# Patient Record
Sex: Female | Born: 1945 | Race: White | Hispanic: No | Smoking: Current every day smoker
Health system: Southern US, Community
[De-identification: ages and names within clinical notes are randomized; demographics above are authoritative.]

## PROBLEM LIST (undated history)

## (undated) DIAGNOSIS — I4891 Unspecified atrial fibrillation: Secondary | ICD-10-CM

## (undated) DIAGNOSIS — I509 Heart failure, unspecified: Secondary | ICD-10-CM

## (undated) DIAGNOSIS — F419 Anxiety disorder, unspecified: Secondary | ICD-10-CM

## (undated) DIAGNOSIS — J449 Chronic obstructive pulmonary disease, unspecified: Secondary | ICD-10-CM

## (undated) DIAGNOSIS — J189 Pneumonia, unspecified organism: Secondary | ICD-10-CM

## (undated) DIAGNOSIS — R0600 Dyspnea, unspecified: Secondary | ICD-10-CM

## (undated) DIAGNOSIS — M1990 Unspecified osteoarthritis, unspecified site: Secondary | ICD-10-CM

## (undated) DIAGNOSIS — T8859XA Other complications of anesthesia, initial encounter: Secondary | ICD-10-CM

## (undated) HISTORY — PX: BACK SURGERY: SHX140

## (undated) HISTORY — PX: CHOLECYSTECTOMY: SHX55

## (undated) HISTORY — PX: NECK SURGERY: SHX720

## (undated) HISTORY — PX: TUBAL LIGATION: SHX77

## (undated) HISTORY — PX: REPLACEMENT TOTAL KNEE: SUR1224

## (undated) NOTE — *Deleted (*Deleted)
Primary Care Physician: Patient, No Pcp Per Referring Physician: [facility] ER  f/u   Sherri Hart is a 66 y.o. female with a h/o  A. fib, tobacco use, COPD not on oxygen and osteoarthritis presenting with shortness of breath, orthopnea leg swelling and admitted for A. fib with RVR and acute on chronic CHF. Originally from [place]. Came to [facility] to take care of grandchildren due to daughter's untimely death. She lost her medications with her luggage.  In ED, in A. fib with RVR. CXR with bilateral opacities concerning for acute cardiogenic pulmonary edema. Started on Cardizem drip, IV Lasix and IV heparin and admitted.  Echocardiogram without significant finding. Lower extremity Doppler negative for DVT. Patient was transitioned to p.o. Cardizem and p.o. Lasix and continue to diurese well.  She had net -8.5 L charted.  Weight down from 208 pounds to 196 pounds.  However, she required 3 L to maintain appropriate saturation with ambulation.  She was discharged with home health and home oxygen.    Today, she denies symptoms of palpitations, chest pain, shortness of breath, orthopnea, PND, lower extremity edema, dizziness, presyncope, syncope, or neurologic sequela. The patient is tolerating medications without difficulties and is otherwise without complaint today.   Past Medical History:  Diagnosis Date  . Atrial fibrillation (HCC)   . Congestive heart failure (HCC)    No past surgical history on file.  Current Outpatient Medications  Medication Sig Dispense Refill  . acetaminophen (TYLENOL) 500 MG tablet Take 500 mg by mouth every 6 (six) hours as needed for moderate pain.    Marland Kitchen albuterol (VENTOLIN HFA) 108 (90 Base) MCG/ACT inhaler Inhale 2 puffs into the lungs every 6 (six) hours as needed for wheezing or shortness of breath. 8 g 2  . apixaban (ELIQUIS) 5 MG TABS tablet Take 1 tablet (5 mg total) by mouth 2 (two) times daily. 180 tablet 1  . diltiazem (CARDIZEM CD) 180 MG 24  hr capsule Take 1 capsule (180 mg total) by mouth daily. 90 capsule 1  . furosemide (LASIX) 40 MG tablet Take 1 tablet (40 mg total) by mouth daily. 90 tablet 1  . tiotropium (SPIRIVA HANDIHALER) 18 MCG inhalation capsule Place 1 capsule (18 mcg total) into inhaler and inhale daily. 30 capsule 2   No current facility-administered medications for this encounter.    Allergies  Allergen Reactions  . Penicillins     Did it involve swelling of the face/tongue/throat, SOB, or low BP? Yes Did it involve sudden or severe rash/hives, skin peeling, or any reaction on the inside of your mouth or nose? N Did you need to seek medical attention at a hospital or doctor's office? Y When did it last happen?childhood If all above answers are "NO", may proceed with cephalosporin use.     Social History   Socioeconomic History  . Marital status: Single    Spouse name: Not on file  . Number of children: Not on file  . Years of education: Not on file  . Highest education level: Not on file  Occupational History  . Not on file  Tobacco Use  . Smoking status: Not on file  Substance and Sexual Activity  . Alcohol use: Not on file  . Drug use: Not on file  . Sexual activity: Not on file  Other Topics Concern  . Not on file  Social History Narrative  . Not on file   Social Determinants of Health   Financial Resource Strain:   .  Difficulty of Paying Living Expenses: Not on file  Food Insecurity:   . Worried About Programme researcher, broadcasting/film/video in the Last Year: Not on file  . Ran Out of Food in the Last Year: Not on file  Transportation Needs: No Transportation Needs  . Lack of Transportation (Medical): No  . Lack of Transportation (Non-Medical): No  Physical Activity:   . Days of Exercise per Week: Not on file  . Minutes of Exercise per Session: Not on file  Stress:   . Feeling of Stress : Not on file  Social Connections:   . Frequency of Communication with Friends and Family: Not on file  .  Frequency of Social Gatherings with Friends and Family: Not on file  . Attends Religious Services: Not on file  . Active Member of Clubs or Organizations: Not on file  . Attends Banker Meetings: Not on file  . Marital Status: Not on file  Intimate Partner Violence:   . Fear of Current or Ex-Partner: Not on file  . Emotionally Abused: Not on file  . Physically Abused: Not on file  . Sexually Abused: Not on file    No family history on file.  ROS- All systems are reviewed and negative except as per the HPI above  Physical Exam: There were no vitals filed for this visit. Wt Readings from Last 3 Encounters:  07/07/20 89 kg    Labs: Lab Results  Component Value Date   NA 137 07/07/2020   K 4.0 07/07/2020   CL 94 (L) 07/07/2020   CO2 33 (H) 07/07/2020   GLUCOSE 93 07/07/2020   BUN 19 07/07/2020   CREATININE 1.21 (H) 07/07/2020   CALCIUM 8.9 07/07/2020   PHOS 3.6 07/07/2020   MG 1.8 07/07/2020   No results found for: INR No results found for: CHOL, HDL, LDLCALC, TRIG   GEN- The patient is well appearing, alert and oriented x 3 today.   Head- normocephalic, atraumatic Eyes-  Sclera clear, conjunctiva pink Ears- hearing intact Oropharynx- clear Neck- supple, no JVP Lymph- no cervical lymphadenopathy Lungs- Clear to ausculation bilaterally, normal work of breathing Heart- Regular rate and rhythm, no murmurs, rubs or gallops, PMI not laterally displaced GI- soft, NT, ND, + BS Extremities- no clubbing, cyanosis, or edema MS- no significant deformity or atrophy Skin- no rash or lesion Psych- euthymic mood, full affect Neuro- strength and sensation are intact  EKG-    Assessment and Plan: 1.Acute hypoxemic respiratory failure likely due to CHF and A. fib:  -Discharged on 3 L by nasal cannula -Treat CHF, A. fib and COPD as below. Bmet/cbc/mag today   2. Acute diastolic CHF: Echo with EF of 55 to 60%, mild concentric LVH, severe LAE, severe RAE, RVSP  of 37.2 and a small pericardial effusion.  Presented with cardinal symptoms including dyspnea, orthopnea or lower extremity edema. Elevated BNP to 500. CXR with mild bilateral pulmonary edema.  Diuresed with IV and p.o. Lasix.  Net -8.5 L.  Weight down from 208 pounds to 96 pounds.  Creatinine slightly up but stable. -Discharged on p.o. Lasix 40 mg daily. -Counseled on sodium and fluid restrictions. -Reassess fluid status and renal function at follow-up.  3. Paroxysmal A. fib with RVR: Likely provoked by CHF and missed doses of her meds. -Discharged on p.o. Cardizem CD 180 mg daily and Eliquis 500 mg twice daily -Ambulatory referral to A. fib clinic  Chronic COPD: doubt exacerbation. Respiratory symptoms likely due to CHF versus COPD. -Discharged  on Spiriva and albuterol.  Insurance did not cover [employer].  Elevated troponin: Flat without significant delta. Likely demand ischemia from CHF and A. fib versus ACS. No chest pain. Echocardiogram reassuring. -No further work-up  Hypokalemia/hypomagnesemia: Resolved. -Recheck at follow-up.

---

## 2020-07-04 ENCOUNTER — Inpatient Hospital Stay (HOSPITAL_COMMUNITY)
Admission: EM | Admit: 2020-07-04 | Discharge: 2020-07-07 | DRG: 291 | Disposition: A | Discharge status: Home Health | Payer: Medicare HMO | Attending: Student | Admitting: Student

## 2020-07-04 ENCOUNTER — Observation Stay (HOSPITAL_COMMUNITY): Payer: Medicare HMO

## 2020-07-04 ENCOUNTER — Encounter (HOSPITAL_COMMUNITY): Payer: Self-pay | Admitting: Physician Assistant

## 2020-07-04 ENCOUNTER — Emergency Department (HOSPITAL_COMMUNITY): Payer: Medicare HMO

## 2020-07-04 ENCOUNTER — Other Ambulatory Visit: Payer: Self-pay

## 2020-07-04 DIAGNOSIS — J441 Chronic obstructive pulmonary disease with (acute) exacerbation: Secondary | ICD-10-CM | POA: Diagnosis not present

## 2020-07-04 DIAGNOSIS — I480 Paroxysmal atrial fibrillation: Secondary | ICD-10-CM | POA: Diagnosis present

## 2020-07-04 DIAGNOSIS — R7989 Other specified abnormal findings of blood chemistry: Secondary | ICD-10-CM | POA: Diagnosis present

## 2020-07-04 DIAGNOSIS — I501 Left ventricular failure: Secondary | ICD-10-CM | POA: Diagnosis not present

## 2020-07-04 DIAGNOSIS — E876 Hypokalemia: Secondary | ICD-10-CM | POA: Diagnosis present

## 2020-07-04 DIAGNOSIS — R5381 Other malaise: Secondary | ICD-10-CM | POA: Diagnosis present

## 2020-07-04 DIAGNOSIS — F17210 Nicotine dependence, cigarettes, uncomplicated: Secondary | ICD-10-CM | POA: Diagnosis present

## 2020-07-04 DIAGNOSIS — I4891 Unspecified atrial fibrillation: Secondary | ICD-10-CM | POA: Diagnosis present

## 2020-07-04 DIAGNOSIS — Z79899 Other long term (current) drug therapy: Secondary | ICD-10-CM

## 2020-07-04 DIAGNOSIS — I110 Hypertensive heart disease with heart failure: Secondary | ICD-10-CM | POA: Diagnosis not present

## 2020-07-04 DIAGNOSIS — J9601 Acute respiratory failure with hypoxia: Secondary | ICD-10-CM | POA: Diagnosis present

## 2020-07-04 DIAGNOSIS — R0602 Shortness of breath: Secondary | ICD-10-CM | POA: Diagnosis not present

## 2020-07-04 DIAGNOSIS — J449 Chronic obstructive pulmonary disease, unspecified: Secondary | ICD-10-CM | POA: Diagnosis present

## 2020-07-04 DIAGNOSIS — I5033 Acute on chronic diastolic (congestive) heart failure: Secondary | ICD-10-CM | POA: Diagnosis present

## 2020-07-04 DIAGNOSIS — Z9114 Patient's other noncompliance with medication regimen: Secondary | ICD-10-CM

## 2020-07-04 DIAGNOSIS — R778 Other specified abnormalities of plasma proteins: Secondary | ICD-10-CM | POA: Diagnosis present

## 2020-07-04 DIAGNOSIS — I5031 Acute diastolic (congestive) heart failure: Secondary | ICD-10-CM | POA: Diagnosis present

## 2020-07-04 DIAGNOSIS — I509 Heart failure, unspecified: Secondary | ICD-10-CM

## 2020-07-04 DIAGNOSIS — I248 Other forms of acute ischemic heart disease: Secondary | ICD-10-CM | POA: Diagnosis present

## 2020-07-04 DIAGNOSIS — Z20822 Contact with and (suspected) exposure to covid-19: Secondary | ICD-10-CM | POA: Diagnosis present

## 2020-07-04 DIAGNOSIS — Z6832 Body mass index (BMI) 32.0-32.9, adult: Secondary | ICD-10-CM

## 2020-07-04 DIAGNOSIS — E669 Obesity, unspecified: Secondary | ICD-10-CM | POA: Diagnosis present

## 2020-07-04 HISTORY — DX: Heart failure, unspecified: I50.9

## 2020-07-04 HISTORY — DX: Unspecified atrial fibrillation: I48.91

## 2020-07-04 LAB — CBC WITH DIFFERENTIAL/PLATELET
Abs Immature Granulocytes: 0.02 10*3/uL (ref 0.00–0.07)
Basophils Absolute: 0 10*3/uL (ref 0.0–0.1)
Basophils Relative: 1 %
Eosinophils Absolute: 0.1 10*3/uL (ref 0.0–0.5)
Eosinophils Relative: 2 %
HCT: 41.3 % (ref 36.0–46.0)
Hemoglobin: 13.3 g/dL (ref 12.0–15.0)
Immature Granulocytes: 0 %
Lymphocytes Relative: 20 %
Lymphs Abs: 1.4 10*3/uL (ref 0.7–4.0)
MCH: 33.1 pg (ref 26.0–34.0)
MCHC: 32.2 g/dL (ref 30.0–36.0)
MCV: 102.7 fL — ABNORMAL HIGH (ref 80.0–100.0)
Monocytes Absolute: 0.6 10*3/uL (ref 0.1–1.0)
Monocytes Relative: 9 %
Neutro Abs: 4.6 10*3/uL (ref 1.7–7.7)
Neutrophils Relative %: 68 %
Platelets: 197 10*3/uL (ref 150–400)
RBC: 4.02 MIL/uL (ref 3.87–5.11)
RDW: 16.4 % — ABNORMAL HIGH (ref 11.5–15.5)
WBC: 6.8 10*3/uL (ref 4.0–10.5)
nRBC: 0 % (ref 0.0–0.2)

## 2020-07-04 LAB — MAGNESIUM: Magnesium: 1.6 mg/dL — ABNORMAL LOW (ref 1.7–2.4)

## 2020-07-04 LAB — BASIC METABOLIC PANEL
Anion gap: 8 (ref 5–15)
BUN: 14 mg/dL (ref 8–23)
CO2: 26 mmol/L (ref 22–32)
Calcium: 9 mg/dL (ref 8.9–10.3)
Chloride: 110 mmol/L (ref 98–111)
Creatinine, Ser: 1.03 mg/dL — ABNORMAL HIGH (ref 0.44–1.00)
GFR, Estimated: 58 mL/min — ABNORMAL LOW (ref >60)
Glucose, Bld: 102 mg/dL — ABNORMAL HIGH (ref 70–99)
Potassium: 3.6 mmol/L (ref 3.5–5.1)
Sodium: 144 mmol/L (ref 135–145)

## 2020-07-04 LAB — TROPONIN I (HIGH SENSITIVITY)
Troponin I (High Sensitivity): 20 ng/L — ABNORMAL HIGH (ref <18)
Troponin I (High Sensitivity): 16 ng/L (ref <18)

## 2020-07-04 LAB — D-DIMER, QUANTITATIVE: D-Dimer, Quant: 2.27 ug/mL-FEU — ABNORMAL HIGH (ref 0.00–0.50)

## 2020-07-04 LAB — RESP PANEL BY RT PCR (RSV, FLU A&B, COVID)
Influenza A by PCR: NEGATIVE
Influenza B by PCR: NEGATIVE
Respiratory Syncytial Virus by PCR: NEGATIVE
SARS Coronavirus 2 by RT PCR: NEGATIVE

## 2020-07-04 LAB — BRAIN NATRIURETIC PEPTIDE: B Natriuretic Peptide: 503.9 pg/mL — ABNORMAL HIGH (ref 0.0–100.0)

## 2020-07-04 MED ORDER — FUROSEMIDE 10 MG/ML IJ SOLN
40.0000 mg | Freq: Once | INTRAMUSCULAR | Status: AC
  Administered 2020-07-05: 40 mg via INTRAVENOUS
  Filled 2020-07-04: qty 4

## 2020-07-04 MED ORDER — MAGNESIUM SULFATE 2 GM/50ML IV SOLN
2.0000 g | Freq: Once | INTRAVENOUS | Status: AC
  Administered 2020-07-04: 2 g via INTRAVENOUS
  Filled 2020-07-04: qty 50

## 2020-07-04 MED ORDER — ALBUTEROL SULFATE HFA 108 (90 BASE) MCG/ACT IN AERS
2.0000 | INHALATION_SPRAY | Freq: Four times a day (QID) | RESPIRATORY_TRACT | Status: DC
  Administered 2020-07-04 – 2020-07-05 (×2): 2 via RESPIRATORY_TRACT
  Filled 2020-07-04: qty 6.7

## 2020-07-04 MED ORDER — HEPARIN SODIUM (PORCINE) 5000 UNIT/ML IJ SOLN
INTRAMUSCULAR | Status: AC
  Administered 2020-07-04: 4000 [IU]
  Filled 2020-07-04: qty 1

## 2020-07-04 MED ORDER — HEPARIN (PORCINE) 25000 UT/250ML-% IV SOLN
1100.0000 [IU]/h | INTRAVENOUS | Status: DC
  Administered 2020-07-04: 1100 [IU]/h via INTRAVENOUS
  Filled 2020-07-04: qty 250

## 2020-07-04 MED ORDER — FENTANYL CITRATE (PF) 100 MCG/2ML IJ SOLN
50.0000 ug | Freq: Once | INTRAMUSCULAR | Status: AC
  Administered 2020-07-04: 50 ug via INTRAVENOUS
  Filled 2020-07-04: qty 2

## 2020-07-04 MED ORDER — ONDANSETRON HCL 4 MG/2ML IJ SOLN: 4.0000 mg | Freq: Four times a day (QID) | INTRAMUSCULAR | Status: DC | PRN

## 2020-07-04 MED ORDER — FUROSEMIDE 10 MG/ML IJ SOLN
40.0000 mg | Freq: Once | INTRAMUSCULAR | Status: AC
  Administered 2020-07-04: 40 mg via INTRAVENOUS
  Filled 2020-07-04: qty 4

## 2020-07-04 MED ORDER — APIXABAN 5 MG PO TABS
5.0000 mg | ORAL_TABLET | Freq: Two times a day (BID) | ORAL | Status: DC
  Administered 2020-07-04 – 2020-07-07 (×6): 5 mg via ORAL
  Filled 2020-07-04 (×6): qty 1

## 2020-07-04 MED ORDER — DILTIAZEM LOAD VIA INFUSION
15.0000 mg | Freq: Once | INTRAVENOUS | Status: AC
  Administered 2020-07-04: 15 mg via INTRAVENOUS
  Filled 2020-07-04: qty 15

## 2020-07-04 MED ORDER — NICOTINE 21 MG/24HR TD PT24
21.0000 mg | MEDICATED_PATCH | Freq: Every day | TRANSDERMAL | Status: DC
  Administered 2020-07-04 – 2020-07-07 (×4): 21 mg via TRANSDERMAL
  Filled 2020-07-04 (×4): qty 1

## 2020-07-04 MED ORDER — METHYLPREDNISOLONE SODIUM SUCC 40 MG IJ SOLR
40.0000 mg | Freq: Two times a day (BID) | INTRAMUSCULAR | Status: DC
  Administered 2020-07-04 – 2020-07-05 (×2): 40 mg via INTRAVENOUS
  Filled 2020-07-04 (×2): qty 1

## 2020-07-04 MED ORDER — POTASSIUM CHLORIDE CRYS ER 20 MEQ PO TBCR
40.0000 meq | EXTENDED_RELEASE_TABLET | Freq: Once | ORAL | Status: AC
  Administered 2020-07-04: 40 meq via ORAL
  Filled 2020-07-04: qty 2

## 2020-07-04 MED ORDER — ACETAMINOPHEN 325 MG PO TABS: 650.0000 mg | ORAL_TABLET | ORAL | Status: DC | PRN

## 2020-07-04 MED ORDER — IOHEXOL 350 MG/ML SOLN
100.0000 mL | Freq: Once | INTRAVENOUS | Status: AC | PRN
  Administered 2020-07-04: 100 mL via INTRAVENOUS

## 2020-07-04 MED ORDER — HEPARIN BOLUS VIA INFUSION
4000.0000 [IU] | Freq: Once | INTRAVENOUS | Status: DC
  Filled 2020-07-04: qty 4000

## 2020-07-04 MED ORDER — DILTIAZEM HCL-DEXTROSE 125-5 MG/125ML-% IV SOLN (PREMIX)
5.0000 mg/h | INTRAVENOUS | Status: DC
  Administered 2020-07-04 – 2020-07-05 (×2): 5 mg/h via INTRAVENOUS
  Filled 2020-07-04 (×2): qty 125

## 2020-07-04 MED ORDER — ALBUTEROL SULFATE HFA 108 (90 BASE) MCG/ACT IN AERS
2.0000 | INHALATION_SPRAY | RESPIRATORY_TRACT | Status: DC | PRN
  Filled 2020-07-04: qty 6.7

## 2020-07-04 MED ORDER — POLYETHYLENE GLYCOL 3350 17 G PO PACK: 17.0000 g | PACK | Freq: Every day | ORAL | Status: DC | PRN

## 2020-07-04 NOTE — ED Triage Notes (Signed)
Pt reports worsening sob and leg swelling over the past few weeks, hx of a fib, recently moved here from up [place]. respirations labored in triage, a fib rvr 150s

## 2020-07-04 NOTE — Progress Notes (Signed)
ANTICOAGULATION CONSULT NOTE   Pharmacy Consult for Heparin>>>Apixaban  Indication: atrial fibrillation  Allergies  Allergen Reactions  . Penicillins     Did it involve swelling of the face/tongue/throat, SOB, or low BP? Yes Did it involve sudden or severe rash/hives, skin peeling, or any reaction on the inside of your mouth or nose? N Did you need to seek medical attention at a hospital or doctor's office? Y When did it last happen?childhood If all above answers are "NO", may proceed with cephalosporin use.     Patient Measurements:   Heparin Dosing Weight: 78kg  Vital Signs: Temp: 97.8 F (36.6 C) (10/26 1814) Temp Source: Oral (10/26 1814) BP: 151/109 (10/26 2030) Pulse Rate: 77 (10/26 2115)  Labs: Recent Labs    07/04/20 1904  HGB 13.3  HCT 41.3  PLT 197  CREATININE 1.03*  TROPONINIHS 20*    CrCl cannot be calculated (Unknown ideal weight.).   Medical History: Past Medical History:  Diagnosis Date  . Atrial fibrillation (HCC)   . Congestive heart failure (HCC)     Assessment: 74 YOF presenting with SOB, in afib with RVR. Has hx of afib was previously on warfarin and switched to Eliquis d/t cutaneous rxn.  She states receiving Eliquis through samples from her PCP but has not taken any of her medications since moving to [place] ~1.24mo ago.    10/26 PM update:  OK to re-start patient on Apixaban   Goal of Therapy:  Monitor platelets by anticoagulation protocol: Yes   Plan:  DC heparin drip  Start Apixaban 5 mg BID Daily CBC Monitor for bleeding  Abran Duke, PharmD, BCPS Clinical Pharmacist Phone: [phone]

## 2020-07-04 NOTE — Progress Notes (Signed)
ANTICOAGULATION CONSULT NOTE - Initial Consult  Pharmacy Consult for heparin Indication: atrial fibrillation  Not on File  Patient Measurements:   Heparin Dosing Weight: 78kg  Vital Signs: Temp: 97.8 F (36.6 C) (10/26 1814) Temp Source: Oral (10/26 1814) BP: 153/110 (10/26 1814) Pulse Rate: 130 (10/26 1814)  Labs: No results for input(s): HGB, HCT, PLT, APTT, LABPROT, INR, HEPARINUNFRC, HEPRLOWMOCWT, CREATININE, CKTOTAL, CKMB, TROPONINIHS in the last 72 hours.  CrCl cannot be calculated (No successful lab value found.).   Medical History: No past medical history on file.  Assessment: 49 YOF presenting with SOB, in afib with RVR. Has hx of afib was previously on warfarin and switched to Eliquis d/t cutaneous rxn.  She states receiving Eliquis through samples from her PCP but has not taken any of her medications since moving to [place] ~1.90mo ago.    Goal of Therapy:  Heparin level 0.3-0.7 units/ml Monitor platelets by anticoagulation protocol: Yes   Plan:  Heparin 4000 units IV x 1, and gtt at 1100 units/hr F/u 8 hour heparin level F/u [facility] plan and ability to transition to PO  Daylene Posey, PharmD Clinical Pharmacist ED Pharmacist Phone # [phone] 07/04/2020 7:26 PM

## 2020-07-04 NOTE — H&P (Signed)
History and Physical    Sherri Hart ZMO:[id number] DOB: 1945/10/21 DOA: 07/04/2020  PCP: No primary care provider on file.  Patient coming from: Home    Chief Complaint:  Chief Complaint  Patient presents with  . Shortness of Breath  . Atrial Fibrillation     HPI:    74 year old female with past history of atrial fibrillation, nicotine dependence, osteoarthritis, COPD who presents to [facility] emergency department with complaints of shortness of breath and leg swelling.  Patient explains that she lives in [place] but for the past several months has been in [place] helping one of her children.  2 weeks ago, she unfortunately found out that her daughter who lives in [place] suffered an untimely death and she then immediately traveled to the [place] area to help take care of her children.  Patient explains that in route, she lost her bag and had her medications in the [place] airport.  Patient explains that she typically takes diltiazem for rate control of her atrial fibrillation as well as a "puffer" for what she thinks is COPD.  Patient is unsure as to whether she has ever been diagnosed with congestive heart failure before.  Patient explains that since she lost her medications she has developed progressively worsening shortness of breath.  Initially shortness of breath was mild in intensity but progressively became more severe day by day.  Shortness breath is worse with exertion and improved with rest.  Patient is also complaining of associated increasing bilateral lower extremity edema.  Patient also seems to be experiencing pillow orthopnea over the same span of time.  Patient does complain of cough although she states that she suffers from a chronic cough.  Patient denies chest pain, fevers or sick contacts.  Due to patient's progressively worsening symptoms she eventually presented to [facility] emergency department for evaluation.  Upon admission in the  emergency department patient was found to be in rapid atrial fibrillation with bilateral patch infiltrates on chest x-ray concerning for acute cardiogenic pulmonary edema.  Patient was administered 40 mg of IV Lasix.  Patient was initiated on a intravenous diltiazem drip.  Patient was also started on a heparin drip by the emergency department provider.  The hospitalist group was then called to assess the patient for mission the hospital.  Review of Systems:   Review of Systems  Respiratory: Positive for cough, shortness of breath and wheezing.   Neurological: Positive for weakness.  All other systems reviewed and are negative.   Past Medical History:  Diagnosis Date  . Atrial fibrillation (HCC)   . Congestive heart failure (HCC)     History reviewed. No pertinent surgical history.   has no history on file for tobacco use, alcohol use, and drug use.  Allergies  Allergen Reactions  . Penicillins     Did it involve swelling of the face/tongue/throat, SOB, or low BP? Yes Did it involve sudden or severe rash/hives, skin peeling, or any reaction on the inside of your mouth or nose? N Did you need to seek medical attention at a hospital or doctor's office? Y When did it last happen?childhood If all above answers are "NO", may proceed with cephalosporin use.     History reviewed. No pertinent family history.   Prior to Admission medications   Medication Sig Start Date End Date Taking? Authorizing Provider  acetaminophen (TYLENOL) 500 MG tablet Take 500 mg by mouth every 6 (six) hours as needed for moderate pain.   Yes [provider]  naproxen sodium (ALEVE) 220 MG tablet Take 220 mg by mouth daily as needed (pain).   Yes [provider]    Physical Exam: Vitals:   07/04/20 2030 07/04/20 2045 07/04/20 2100 07/04/20 2115  BP: (!) 151/109     Pulse: 61   77  Resp:      Temp:      TempSrc:      SpO2: (!) 57% 94% 95% 94%    Constitutional: Acute alert  and oriented x3, patient is in mild respiratory distress Skin: no rashes, no lesions, good skin turgor noted. Eyes: Pupils are equally reactive to light.  No evidence of scleral icterus or conjunctival pallor.  ENMT: Moist mucous membranes noted.  Posterior pharynx clear of any exudate or lesions.   Neck: normal, supple, no masses, no thyromegaly.  Notable distention of the jugular vein sitting at 45 degrees. Respiratory: Significant bibasilar and mid field rales with notable expiratory wheezing in all fields with prolonged expiratory phase.  Patient is tachypneic without evidence of accessory muscle use.  Cardiovascular: Tachycardic rate with regular rhythm,, no murmurs / rubs / gallops.  Significant bilateral lower extremity pitting edema that tracks up to the knees. 2+ pedal pulses. No carotid bruits.  Chest:   Nontender without crepitus or deformity.   Back:   Nontender without crepitus or deformity. Abdomen: Abdomen is soft and nontender.  No evidence of intra-abdominal masses.  Positive bowel sounds noted in all quadrants.   Musculoskeletal: No joint deformity upper and lower extremities. Good ROM, no contractures. Normal muscle tone.  Neurologic: CN 2-12 grossly intact. Sensation intact.  Patient moving all 4 extremities spontaneously.  Patient is following all commands.  Patient is responsive to verbal stimuli.   Psychiatric: Patient exhibits an anxious mood with appropriate affect.  Patient seems to possess insight as to their current situation.     Labs on Admission: I have personally reviewed following labs and imaging studies -   CBC: Recent Labs  Lab 07/04/20 1904  WBC 6.8  NEUTROABS 4.6  HGB 13.3  HCT 41.3  MCV 102.7*  PLT 197   Basic Metabolic Panel: Recent Labs  Lab 07/04/20 1904  NA 144  K 3.6  CL 110  CO2 26  GLUCOSE 102*  BUN 14  CREATININE 1.03*  CALCIUM 9.0  MG 1.6*   GFR: CrCl cannot be calculated (Unknown ideal weight.). Liver Function Tests: No  results for input(s): AST, ALT, ALKPHOS, BILITOT, PROT, ALBUMIN in the last 168 hours. No results for input(s): LIPASE, AMYLASE in the last 168 hours. No results for input(s): AMMONIA in the last 168 hours. Coagulation Profile: No results for input(s): INR, PROTIME in the last 168 hours. Cardiac Enzymes: No results for input(s): CKTOTAL, CKMB, CKMBINDEX, TROPONINI in the last 168 hours. BNP (last 3 results) No results for input(s): PROBNP in the last 8760 hours. HbA1C: No results for input(s): HGBA1C in the last 72 hours. CBG: No results for input(s): GLUCAP in the last 168 hours. Lipid Profile: No results for input(s): CHOL, HDL, LDLCALC, TRIG, CHOLHDL, LDLDIRECT in the last 72 hours. Thyroid Function Tests: No results for input(s): TSH, T4TOTAL, FREET4, T3FREE, THYROIDAB in the last 72 hours. Anemia Panel: No results for input(s): VITAMINB12, FOLATE, FERRITIN, TIBC, IRON, RETICCTPCT in the last 72 hours. Urine analysis: No results found for: COLORURINE, APPEARANCEUR, LABSPEC, PHURINE, GLUCOSEU, HGBUR, BILIRUBINUR, KETONESUR, PROTEINUR, UROBILINOGEN, NITRITE, LEUKOCYTESUR  Radiological Exams on Admission - Personally Reviewed: DG Chest Portable 1 View  Result  Date: 07/04/2020 CLINICAL DATA:  AFib EXAM: PORTABLE CHEST 1 VIEW COMPARISON:  None. FINDINGS: The heart size and mediastinal contours are large with mild cardiomegaly. There is diffusely increased interstitial markings throughout both lungs. A small left and trace right pleural effusion are present. No acute osseous abnormality. IMPRESSION: Cardiomegaly and interstitial edema. Small left and trace right pleural effusion. Electronically Signed   By: Jonna Clark M.D.   On: 07/04/2020 19:30    EKG: Personally reviewed.  Rhythm is atrial fibrillation with heart rate of 151 bpm.  Notable T wave inversions in the lateral leads.    Assessment/Plan Principal Problem:   Atrial fibrillation with rapid ventricular response  [facility])   Patient presenting with rapid atrial fibrillation with heart rates in the 150s  Patient is a known history of atrial fibrillation but unfortunately seems to have lost her home regimen of diltiazem in transit several weeks ago  Patient additionally presenting with evidence of acute cardiogenic volume overload including acute cardiogenic pulmonary edema -patient is not certain as to whether or not she has a history of congestive heart failure her.  Patient is already been provided a dose of 40 mg of IV Lasix in the emergency department.  Will provide an additional dose in the morning and at that time patient be reassessed by the daytime provider for continued intravenous diuretic use.  Patient has been initiated on diltiazem infusion by the emergency department staff.  This will be continued for now and eventually transitioned over to oral diltiazem therapy once we have achieved a consistent rate control.  Emergency department provider has also ordered a heparin drip.  Patient reports having been on Eliquis and tolerating it well although states that affording the medication will be an issue.  Patient has also been on Coumadin in the past but this was discontinued due to bleeding complication.  Will transition patient to Eliquis and place case management consultation for assistance with medications.  Obtaining TSH, magnesium, troponin, D-dimer  Pending echocardiogram in the morning   Admitting patient to progressive unit  Active Problems:   Acute cardiogenic pulmonary edema (HCC)   Evidence of acute cardiogenic pulmonary edema on chest imaging secondary to acute cardiogenic volume overload secondary to rapid atrial fibrillation  Patient is uncertain as to whether or not she has a history of congestive heart failure.  Patient states that she is not on diuretics in the outpatient setting.  As mentioned above, intravenous Lasix given in the emergency department.  Will provide an  additional dose in the morning for patient be reassessed for further doses of intravenous diuretics.  Echocardiogram ordered for the morning  Supplemental oxygen for bouts of hypoxia  Currently treating patient with steroids and bronchodilator therapy for concurrent COPD exacerbation.    COPD with acute exacerbation (HCC)   Patient exhibiting substantial wheezing with prolonged expiratory phase consistent with likely COPD exacerbation  Patient has a longstanding history of smoking  Patient reports being on bronchodilators previously and believes she has been diagnosed with COPD in the past but is not certain.  Exacerbation likely brought about by presence of acute pulmonary edema.  Placing patient on Solu-Medrol 40 mg IV every 12  Placing patient on scheduled bronchodilator therapy    Elevated troponin level not due myocardial infarction   Slightly elevated troponin likely secondary to supply demand mismatch from rapid atrial fibrillation  Unlikely to be secondary to plaque rupture  Patient is chest pain-free  Monitoring patient on telemetry  Cycling cardiac enzymes  Hypomagnesemia  Replacing with intravenous magnesium sulfate  Monitoring magnesium levels with serial chemistries.   Code Status:  Full code Family Communication: deferred   Status is: Observation  The patient remains OBS appropriate and will d/c before 2 midnights.  Dispo: The patient is from: Home              Anticipated d/c is to: Home              Anticipated d/c date is: 2 days              Patient currently is not medically stable to d/c.        Marinda Elk MD [facility] Pager [phone]  If 7PM-7AM, please contact night-coverage [email or web address] Use universal [place] password for that web site. If you do not have the password, please call the hospital operator.  07/04/2020, 10:16 PM

## 2020-07-04 NOTE — ED Provider Notes (Signed)
[facility] EMERGENCY DEPARTMENT Provider Note   CSN: [id number] Arrival date & time: 07/04/20  1805     History Chief Complaint  Patient presents with  . Shortness of Breath  . Atrial Fibrillation    Sherri Hart is a 74 y.o. female.  HPI    74 year old female with history of CHF, A. fib, hypertension, who presents to the emergency department today for evaluation of shortness of breath.  Patient states that she has a history of CHF, A. fib, hypertension, but all of her medications were lost and she has not had any of them for the last month and a half.  She has been feeling short of breath for the last several months but it has gotten worse in the last week or so.  She is also had increased bilateral lower extremity swelling.  She is had an intermittent cough as well but has not had any fevers.  She has some intermittent chest pain but none currently.  Denies any abdominal pain.  History reviewed. No pertinent past medical history.  There are no problems to display for this patient.   History reviewed. No pertinent surgical history.   OB History   No obstetric history on file.     History reviewed. No pertinent family history.  Social History   Tobacco Use  . Smoking status: Not on file  Substance Use Topics  . Alcohol use: Not on file  . Drug use: Not on file    Home Medications Prior to Admission medications   Not on File    Allergies    Patient has no allergy information on record.  Review of Systems   Review of Systems  Constitutional: Negative for chills and fever.  HENT: Negative for ear pain and sore throat.   Eyes: Negative for visual disturbance.  Respiratory: Positive for cough and shortness of breath.   Cardiovascular: Positive for chest pain (currently resolved) and leg swelling.  Gastrointestinal: Negative for abdominal pain, constipation, diarrhea, nausea and vomiting.  Genitourinary: Negative for dysuria and  hematuria.  Musculoskeletal: Negative for back pain.  Skin: Negative for color change and rash.  Neurological: Negative for headaches.  All other systems reviewed and are negative.   Physical Exam Updated Vital Signs BP (!) 151/109   Pulse 61   Temp 97.8 F (36.6 C) (Oral)   Resp (!) 26   SpO2 95%   Physical Exam Vitals and nursing note reviewed.  Constitutional:      General: She is not in acute distress.    Appearance: She is well-developed.  HENT:     Head: Normocephalic and atraumatic.  Eyes:     Conjunctiva/sclera: Conjunctivae normal.  Cardiovascular:     Heart sounds: Normal heart sounds. No murmur heard.      Comments: Irregularly irregular rhythm Pulmonary:     Effort: Pulmonary effort is normal. No respiratory distress.     Breath sounds: Rhonchi and rales present.  Abdominal:     General: Bowel sounds are normal.     Palpations: Abdomen is soft.     Tenderness: There is no abdominal tenderness. There is no guarding or rebound.  Musculoskeletal:     Cervical back: Neck supple.     Right lower leg: Tenderness present. Edema present.     Left lower leg: Tenderness present. Edema present.  Skin:    General: Skin is warm and dry.  Neurological:     Mental Status: She is alert.  ED Results / Procedures / Treatments   Labs (all labs ordered are listed, but only abnormal results are displayed) Labs Reviewed  CBC WITH DIFFERENTIAL/PLATELET - Abnormal; Notable for the following components:      Result Value   MCV 102.7 (*)    RDW 16.4 (*)    All other components within normal limits  BASIC METABOLIC PANEL - Abnormal; Notable for the following components:   Glucose, Bld 102 (*)    Creatinine, Ser 1.03 (*)    GFR, Estimated 58 (*)    All other components within normal limits  BRAIN NATRIURETIC PEPTIDE - Abnormal; Notable for the following components:   B Natriuretic Peptide 503.9 (*)    All other components within normal limits  MAGNESIUM - Abnormal;  Notable for the following components:   Magnesium 1.6 (*)    All other components within normal limits  TROPONIN I (HIGH SENSITIVITY) - Abnormal; Notable for the following components:   Troponin I (High Sensitivity) 20 (*)    All other components within normal limits  RESP PANEL BY RT PCR (RSV, FLU A&B, COVID)  HEPARIN LEVEL (UNFRACTIONATED)  CBC  TROPONIN I (HIGH SENSITIVITY)    EKG EKG Interpretation  Date/Time:  Tuesday July 04 2020 18:11:26 EDT Ventricular Rate:  151 PR Interval:    QRS Duration: 76 QT Interval:  296 QTC Calculation: 469 R Axis:   94 Text Interpretation: Atrial fibrillation with rapid ventricular response Rightward axis Septal infarct , age undetermined ST & T wave abnormality, consider inferolateral ischemia Abnormal ECG No old tracing to compare Confirmed by Meridee Score [phone]) on 07/04/2020 6:28:35 PM   Radiology DG Chest Portable 1 View  Result Date: 07/04/2020 CLINICAL DATA:  AFib EXAM: PORTABLE CHEST 1 VIEW COMPARISON:  None. FINDINGS: The heart size and mediastinal contours are large with mild cardiomegaly. There is diffusely increased interstitial markings throughout both lungs. A small left and trace right pleural effusion are present. No acute osseous abnormality. IMPRESSION: Cardiomegaly and interstitial edema. Small left and trace right pleural effusion. Electronically Signed   By: Jonna Clark M.D.   On: 07/04/2020 19:30    Procedures Procedures (including critical care time)  7:00 PM Cardiac monitoring reveals afib withRVR, HR 120s (Rate & rhythm), as reviewed and interpreted by me. Cardiac monitoring was ordered due to afib, tachycardia and to monitor patient for dysrhythmia.  CRITICAL CARE Performed by: Karrie Meres   Total critical care time: 34 minutes  Critical care time was exclusive of separately billable procedures and treating other patients.  Critical care was necessary to treat or prevent imminent or  life-threatening deterioration.  Critical care was time spent personally by me on the following activities: development of treatment plan with patient and/or surrogate as well as nursing, discussions with consultants, evaluation of patient's response to treatment, examination of patient, obtaining history from patient or surrogate, ordering and performing treatments and interventions, ordering and review of laboratory studies, ordering and review of radiographic studies, pulse oximetry and re-evaluation of patient's condition.   Medications Ordered in ED Medications  diltiazem (CARDIZEM) 1 mg/mL load via infusion 15 mg (15 mg Intravenous Bolus from Bag 07/04/20 2023)    And  diltiazem (CARDIZEM) 125 mg in dextrose 5% 125 mL (1 mg/mL) infusion (5 mg/hr Intravenous New Bag/Given 07/04/20 2030)  heparin bolus via infusion 4,000 Units ( Intravenous Not Given 07/04/20 2031)  heparin ADULT infusion 100 units/mL (25000 units/233mL sodium chloride 0.45%) (1,100 Units/hr Intravenous New Bag/Given 07/04/20 2036)  furosemide (  LASIX) injection 40 mg (40 mg Intravenous Given 07/04/20 2031)  heparin 5000 UNIT/ML injection (4,000 Units  Given 07/04/20 2031)  fentaNYL (SUBLIMAZE) injection 50 mcg (50 mcg Intravenous Given 07/04/20 2040)    ED Course  I have reviewed the triage vital signs and the nursing notes.  Pertinent labs & imaging results that were available during my care of the patient were reviewed by me and considered in my medical decision making (see chart for details).  Clinical Course as of Jul 04 2106  Tue Jul 04, 2020  4530 73 year old female with history of A. fib here with worsening shortness of breath peripheral edema in the setting of not taking her meds for over a month.  She is in rapid ventricular response so will need IV medications and diuresis and admission to the hospital.   [MB]    Clinical Course User Index [MB] Terrilee Files, MD   MDM Rules/Calculators/A&P                           74 year old female presenting for evaluation shortness of breath.  History of CHF A. fib, hypertension has been out of medications for the last month and a half.  CBC is with a leukocytosis or anemia BMP with normal electrolytes and kidney function Troponin marginally elevated at twenty, likely due to demand BNP elevated at 503  Magnesium low at 1.6  EKG - Atrial fibrillation with rapid ventricular response Rightward axis Septal infarct , age undetermined ST & T wave abnormality, consider inferolateral ischemia Abnormal ECG No old tracing to compare  Chest x-ray reviewed/interpreted - Cardiomegaly and interstitial edema. Small left and trace right pleural effusion.  MDM: Patient found to be in A. fib with RVR with heart failure exacerbation likely from noncompliance with medication.  Is mildly hypoxic and requiring 2 L O2.  Will admit for further treatment of A. fib with RVR and diuresis.  She was given Lasix in the ED as well as being started on a dilt bolus and drip. Heparin also ordered for anticoag per recommendation by Dr. Charm Barges supervising physician.  9:03 PM CONSULT with Dr. Leafy Half with hospitalist service who accepts patient for admission    Final Clinical Impression(s) / ED Diagnoses Final diagnoses:  Atrial fibrillation with RVR (HCC)  Acute on chronic congestive heart failure, unspecified heart failure type [facility])    Rx / DC Orders ED Discharge Orders         Ordered    Amb referral to AFIB Clinic        07/04/20 1905           Rayne Du 07/04/20 2107    Terrilee Files, MD 07/05/20 1231

## 2020-07-05 ENCOUNTER — Encounter (HOSPITAL_COMMUNITY): Payer: Medicare HMO

## 2020-07-05 ENCOUNTER — Observation Stay (HOSPITAL_COMMUNITY): Payer: Medicare HMO

## 2020-07-05 ENCOUNTER — Inpatient Hospital Stay (HOSPITAL_COMMUNITY): Payer: Medicare HMO

## 2020-07-05 DIAGNOSIS — J449 Chronic obstructive pulmonary disease, unspecified: Secondary | ICD-10-CM | POA: Diagnosis present

## 2020-07-05 DIAGNOSIS — I5033 Acute on chronic diastolic (congestive) heart failure: Secondary | ICD-10-CM | POA: Diagnosis present

## 2020-07-05 DIAGNOSIS — J9601 Acute respiratory failure with hypoxia: Secondary | ICD-10-CM | POA: Diagnosis present

## 2020-07-05 DIAGNOSIS — R5381 Other malaise: Secondary | ICD-10-CM | POA: Diagnosis present

## 2020-07-05 DIAGNOSIS — M7989 Other specified soft tissue disorders: Secondary | ICD-10-CM

## 2020-07-05 DIAGNOSIS — I361 Nonrheumatic tricuspid (valve) insufficiency: Secondary | ICD-10-CM

## 2020-07-05 DIAGNOSIS — I4891 Unspecified atrial fibrillation: Secondary | ICD-10-CM | POA: Diagnosis not present

## 2020-07-05 DIAGNOSIS — I5031 Acute diastolic (congestive) heart failure: Secondary | ICD-10-CM | POA: Diagnosis present

## 2020-07-05 DIAGNOSIS — E876 Hypokalemia: Secondary | ICD-10-CM | POA: Diagnosis present

## 2020-07-05 DIAGNOSIS — Z9114 Patient's other noncompliance with medication regimen: Secondary | ICD-10-CM | POA: Diagnosis not present

## 2020-07-05 DIAGNOSIS — R0602 Shortness of breath: Secondary | ICD-10-CM | POA: Diagnosis present

## 2020-07-05 DIAGNOSIS — Z6832 Body mass index (BMI) 32.0-32.9, adult: Secondary | ICD-10-CM | POA: Diagnosis not present

## 2020-07-05 DIAGNOSIS — Z20822 Contact with and (suspected) exposure to covid-19: Secondary | ICD-10-CM | POA: Diagnosis present

## 2020-07-05 DIAGNOSIS — Z79899 Other long term (current) drug therapy: Secondary | ICD-10-CM | POA: Diagnosis not present

## 2020-07-05 DIAGNOSIS — E669 Obesity, unspecified: Secondary | ICD-10-CM | POA: Diagnosis present

## 2020-07-05 DIAGNOSIS — I480 Paroxysmal atrial fibrillation: Secondary | ICD-10-CM | POA: Diagnosis present

## 2020-07-05 DIAGNOSIS — I248 Other forms of acute ischemic heart disease: Secondary | ICD-10-CM | POA: Diagnosis present

## 2020-07-05 DIAGNOSIS — I501 Left ventricular failure: Secondary | ICD-10-CM | POA: Diagnosis not present

## 2020-07-05 DIAGNOSIS — I110 Hypertensive heart disease with heart failure: Secondary | ICD-10-CM | POA: Diagnosis present

## 2020-07-05 DIAGNOSIS — F17200 Nicotine dependence, unspecified, uncomplicated: Secondary | ICD-10-CM

## 2020-07-05 DIAGNOSIS — I509 Heart failure, unspecified: Secondary | ICD-10-CM | POA: Diagnosis not present

## 2020-07-05 DIAGNOSIS — J441 Chronic obstructive pulmonary disease with (acute) exacerbation: Secondary | ICD-10-CM | POA: Diagnosis not present

## 2020-07-05 LAB — ECHOCARDIOGRAM COMPLETE
Area-P 1/2: 4.65 cm2
Calc EF: 58.2 %
Height: 67 in
S' Lateral: 4 cm
Single Plane A2C EF: 59.6 %
Single Plane A4C EF: 54.1 %
Weight: 3315.72 oz

## 2020-07-05 LAB — CBC WITH DIFFERENTIAL/PLATELET
Abs Immature Granulocytes: 0.02 10*3/uL (ref 0.00–0.07)
Basophils Absolute: 0.1 10*3/uL (ref 0.0–0.1)
Basophils Relative: 1 %
Eosinophils Absolute: 0.2 10*3/uL (ref 0.0–0.5)
Eosinophils Relative: 2 %
HCT: 40.6 % (ref 36.0–46.0)
Hemoglobin: 12.9 g/dL (ref 12.0–15.0)
Immature Granulocytes: 0 %
Lymphocytes Relative: 22 %
Lymphs Abs: 1.6 10*3/uL (ref 0.7–4.0)
MCH: 32.4 pg (ref 26.0–34.0)
MCHC: 31.8 g/dL (ref 30.0–36.0)
MCV: 102 fL — ABNORMAL HIGH (ref 80.0–100.0)
Monocytes Absolute: 0.6 10*3/uL (ref 0.1–1.0)
Monocytes Relative: 9 %
Neutro Abs: 4.7 10*3/uL (ref 1.7–7.7)
Neutrophils Relative %: 66 %
Platelets: 192 10*3/uL (ref 150–400)
RBC: 3.98 MIL/uL (ref 3.87–5.11)
RDW: 16.3 % — ABNORMAL HIGH (ref 11.5–15.5)
WBC: 7.1 10*3/uL (ref 4.0–10.5)
nRBC: 0 % (ref 0.0–0.2)

## 2020-07-05 LAB — COMPREHENSIVE METABOLIC PANEL
ALT: 22 U/L (ref 0–44)
AST: 23 U/L (ref 15–41)
Albumin: 3.3 g/dL — ABNORMAL LOW (ref 3.5–5.0)
Alkaline Phosphatase: 71 U/L (ref 38–126)
Anion gap: 12 (ref 5–15)
BUN: 14 mg/dL (ref 8–23)
CO2: 26 mmol/L (ref 22–32)
Calcium: 8.7 mg/dL — ABNORMAL LOW (ref 8.9–10.3)
Chloride: 103 mmol/L (ref 98–111)
Creatinine, Ser: 1.08 mg/dL — ABNORMAL HIGH (ref 0.44–1.00)
GFR, Estimated: 55 mL/min — ABNORMAL LOW (ref >60)
Glucose, Bld: 117 mg/dL — ABNORMAL HIGH (ref 70–99)
Potassium: 3.2 mmol/L — ABNORMAL LOW (ref 3.5–5.1)
Sodium: 141 mmol/L (ref 135–145)
Total Bilirubin: 0.8 mg/dL (ref 0.3–1.2)
Total Protein: 6.4 g/dL — ABNORMAL LOW (ref 6.5–8.1)

## 2020-07-05 LAB — MAGNESIUM: Magnesium: 2.3 mg/dL (ref 1.7–2.4)

## 2020-07-05 LAB — MRSA PCR SCREENING: MRSA by PCR: NEGATIVE

## 2020-07-05 LAB — TSH: TSH: 3.396 u[IU]/mL (ref 0.350–4.500)

## 2020-07-05 LAB — TROPONIN I (HIGH SENSITIVITY): Troponin I (High Sensitivity): 19 ng/L — ABNORMAL HIGH (ref <18)

## 2020-07-05 MED ORDER — IPRATROPIUM BROMIDE 0.02 % IN SOLN: 0.5000 mg | Freq: Four times a day (QID) | RESPIRATORY_TRACT | Status: DC

## 2020-07-05 MED ORDER — OXYCODONE-ACETAMINOPHEN 5-325 MG PO TABS
1.0000 | ORAL_TABLET | Freq: Three times a day (TID) | ORAL | Status: DC | PRN
  Administered 2020-07-05 – 2020-07-07 (×6): 1 via ORAL
  Filled 2020-07-05 (×6): qty 1

## 2020-07-05 MED ORDER — FUROSEMIDE 10 MG/ML IJ SOLN
40.0000 mg | Freq: Once | INTRAMUSCULAR | Status: AC
  Administered 2020-07-05: 40 mg via INTRAVENOUS
  Filled 2020-07-05: qty 4

## 2020-07-05 MED ORDER — FUROSEMIDE 10 MG/ML IJ SOLN: 40.0000 mg | Freq: Two times a day (BID) | INTRAMUSCULAR | Status: DC

## 2020-07-05 MED ORDER — LEVALBUTEROL HCL 0.63 MG/3ML IN NEBU: 0.6300 mg | INHALATION_SOLUTION | Freq: Four times a day (QID) | RESPIRATORY_TRACT | Status: DC

## 2020-07-05 MED ORDER — IPRATROPIUM BROMIDE 0.02 % IN SOLN
0.5000 mg | Freq: Two times a day (BID) | RESPIRATORY_TRACT | Status: DC
  Administered 2020-07-05: 0.5 mg via RESPIRATORY_TRACT
  Filled 2020-07-05: qty 2.5

## 2020-07-05 MED ORDER — ALBUTEROL SULFATE HFA 108 (90 BASE) MCG/ACT IN AERS
2.0000 | INHALATION_SPRAY | Freq: Three times a day (TID) | RESPIRATORY_TRACT | Status: DC
  Administered 2020-07-05: 2 via RESPIRATORY_TRACT
  Filled 2020-07-05: qty 6.7

## 2020-07-05 MED ORDER — GUAIFENESIN 100 MG/5ML PO SOLN
5.0000 mL | ORAL | Status: DC | PRN
  Administered 2020-07-05: 100 mg via ORAL
  Filled 2020-07-05: qty 5

## 2020-07-05 MED ORDER — LEVALBUTEROL HCL 0.63 MG/3ML IN NEBU
0.6300 mg | INHALATION_SOLUTION | Freq: Two times a day (BID) | RESPIRATORY_TRACT | Status: DC
  Administered 2020-07-05: 0.63 mg via RESPIRATORY_TRACT
  Filled 2020-07-05: qty 3

## 2020-07-05 MED ORDER — FENTANYL CITRATE (PF) 100 MCG/2ML IJ SOLN
25.0000 ug | INTRAMUSCULAR | Status: AC | PRN
  Administered 2020-07-05 (×2): 25 ug via INTRAVENOUS
  Filled 2020-07-05 (×2): qty 2

## 2020-07-05 MED ORDER — POTASSIUM CHLORIDE CRYS ER 20 MEQ PO TBCR
40.0000 meq | EXTENDED_RELEASE_TABLET | ORAL | Status: AC
  Administered 2020-07-05 (×2): 40 meq via ORAL
  Filled 2020-07-05 (×2): qty 2

## 2020-07-05 MED ORDER — DILTIAZEM HCL 60 MG PO TABS
30.0000 mg | ORAL_TABLET | Freq: Four times a day (QID) | ORAL | Status: DC
  Administered 2020-07-06 (×2): 30 mg via ORAL
  Filled 2020-07-05 (×3): qty 1

## 2020-07-05 MED ORDER — LEVALBUTEROL HCL 0.63 MG/3ML IN NEBU: 0.6300 mg | INHALATION_SOLUTION | Freq: Four times a day (QID) | RESPIRATORY_TRACT | Status: DC | PRN

## 2020-07-05 NOTE — Progress Notes (Signed)
  ReDS Clip Diuretic Study Pt study # [id number]  Your patient has been enrolled in the ReDS Clip Diuretic Study  Weight on admission 208 lbs, down to 207 lbs today. SCr stable 1.08 (CrCl ~ 55 mL/min). Has received IV lasix 40 mg x 2. Not on diuretics PTA.  Changes to prescribed diuretics recommended:  Reasonable to continue to hold IV lasix unless further signs of peripheral edema based on MD exam.  Provider contacted: Dr. Alanda Slim Recommendation was accepted by provider.    REDS Clip  READING= 28% (normal 25-35%)  CHEST RULER = 32 Clip Station = D   Orthodema score = 1 Signs/Symptoms Score   Mild edema, no orthopnea 0 No congestion  Moderate edema, no orthopnea 1 Low-grade orthodema/congestion  Severe edema OR orthopnea 2   Moderate edema and orthopnea 3 High-grade orthodema/congestion  Severe edema AND orthopnea 4    Sharen Hones, PharmD, BCPS Heart Failure Stewardship Pharmacist Phone [phone]  Please check [email or web address] for unit-specific pharmacist phone numbers

## 2020-07-05 NOTE — Plan of Care (Signed)

## 2020-07-05 NOTE — Discharge Instructions (Signed)

## 2020-07-05 NOTE — Progress Notes (Addendum)
Heart Failure Patient Advocate Encounter  Application for [employer] is in progress in an effort to reduce the patient's out of pocket expense for Eliquis from $47 to $0.    Patient portion of application completed on 07/05/20.  Prescriber portion of application will be completed once plan is established for outpatient follow up.   The completed application will then be faxed to [phone].   Will continue to follow.  Sharen Hones, PharmD, BCPS Heart Failure Stewardship Pharmacist Phone [phone]

## 2020-07-05 NOTE — Progress Notes (Signed)
PROGRESS NOTE  Sherri Hart AVW:[id number] DOB: 02/03/1946   PCP: Patient, No Pcp Per  Patient is from: Home  DOA: 07/04/2020 LOS: 0  Chief complaints: Shortness of breath  Brief Narrative / Interim history: 74 year old female with history of A. fib, tobacco use, COPD not on oxygen and osteoarthritis presenting with shortness of breath, orthopnea leg swelling and admitted for A. fib with RVR and acute on chronic CHF.  Originally from [place].  Came to [facility] to take care of grandchildren due to daughter's untimely death.  She lost her medications with her luggage.  In ED, in A. fib with RVR.  CXR with bilateral opacities concerning for acute cardiogenic pulmonary edema.  Started on Cardizem drip, IV Lasix and IV heparin and admitted.  Subjective: No major events overnight of this morning.  Reports improvement in her breathing and swelling but continues to require 4 L by [place].  She denies chest pain.  Denies GI or UTI symptoms.  Reports making a lot of urine.  Objective: Vitals:   07/05/20 0500 07/05/20 0639 07/05/20 0740 07/05/20 1100  BP:  (!) 119/97  124/74  Pulse:  99  84  Resp:  16  19  Temp:  97.9 F (36.6 C)  98 F (36.7 C)  TempSrc:  Oral  Oral  SpO2:  91% 93% 93%  Weight: 94 kg     Height:        Intake/Output Summary (Last 24 hours) at 07/05/2020 1430 Last data filed at 07/05/2020 1100 Gross per 24 hour  Intake 1006.13 ml  Output 3400 ml  Net -2393.87 ml   Filed Weights   07/04/20 2330 07/05/20 0500  Weight: 94.5 kg 94 kg    Examination:  GENERAL: No apparent distress.  Nontoxic. HEENT: MMM.  Vision and hearing grossly intact.  NECK: Supple.  No apparent JVD.  RESP: 97% on 4 L.  No IWOB.  Fine bibasilar crackles. CVS:  RRR. Heart sounds normal.  ABD/GI/GU: BS+. Abd soft, NTND.  MSK/EXT:  Moves extremities.  1+ pitting edema.  Some tenderness in both calves. SKIN: Some skin exfoliation seen in BLE without signs of infection. NEURO: Awake, alert  and oriented appropriately.  No apparent focal neuro deficit. PSYCH: Calm. Normal affect.  Procedures:  None  Microbiology summarized: COVID-19 PCR negative. Influenza PCR negative. MRSA PCR negative.  Assessment & Plan: Acute hypoxemic respiratory failure likely due to CHF and A. fib: Not on oxygen at home.  Has been requiring 4 L to maintain saturation in low 90s. -Treat CHF and A. fib as below -Wean oxygen as able  Acute diastolic CHF: Echo with EF of 55 to 60%, mild concentric LVH, severe LAE, severe RAE, RVSP of 37.2 and a small pericardial effusion.  Has cardinal symptoms including dyspnea, orthopnea or lower extremity edema.  Elevated BNP to 500.  CXR with mild bilateral pulmonary edema.  Improved with IV Lasix.-2.2 L UOP overnight.  Renal function is stable.  Still with significant fluid overload. -Continue IV Lasix -Monitor fluid status, renal function and electrolytes. -Fluid and sodium restrictions.  Paroxysmal A. fib with RVR: Likely provoked by CHF and missed doses of her meds.  HR in 80s.  -Change albuterol to Xopenex -Transition to p.o. Cardizem 30 mg every 6 hours -Continue Eliquis for anticoagulation  Chronic COPD: Doubt exacerbation.  Respiratory symptoms likely due to CHF versus COPD. -Change albuterol to Xopenex-given atrial fibrillation. -Add Atrovent -Continue controllers. -Discontinue steroid -Wean oxygen as able  Elevated troponin: Flat without significant  delta.  Likely demand ischemia from CHF and A. fib versus ACS.  No chest pain.  Echocardiogram reassuring. -No further work-up required  Hypokalemia/hypomagnesemia -Replenish and recheck.  Grief-recently lost her daughter -Emotional support.  BLE swelling/Calf tenderness -BLE venous Doppler to exclude DVT.  Debility: Uses cane at baseline. -PT/OT  Tobacco use disorder: -Encourage cessation -Nicotine patch.  Class I obesity Body mass index is 32.46 kg/m.         DVT prophylaxis:    apixaban (ELIQUIS) tablet 5 mg  Code Status: Full code Family Communication: Patient and/or RN. Available if any question.  Status is: Observation  The patient will require care spanning > 2 midnights and should be moved to inpatient because: Hemodynamically unstable, Persistent severe electrolyte disturbances, Unsafe d/c plan, IV treatments appropriate due to intensity of illness or inability to take PO and Inpatient level of care appropriate due to severity of illness  Dispo: The patient is from: Home              Anticipated d/c is to: Home              Anticipated d/c date is: 1 day              Patient currently is not medically stable to d/c.       Consultants:  None   Sch Meds:  Scheduled Meds: . apixaban  5 mg Oral BID  . diltiazem  30 mg Oral Q6H  . furosemide  40 mg Intravenous Once  . [START ON 07/06/2020] furosemide  40 mg Intravenous Q12H  . ipratropium  0.5 mg Nebulization BID  . levalbuterol  0.63 mg Nebulization BID  . nicotine  21 mg Transdermal Daily   Continuous Infusions: . diltiazem (CARDIZEM) infusion 5 mg/hr (07/04/20 2030)   PRN Meds:.acetaminophen, levalbuterol, ondansetron (ZOFRAN) IV, polyethylene glycol  Antimicrobials: Anti-infectives (From admission, onward)   None       I have personally reviewed the following labs and images: CBC: Recent Labs  Lab 07/04/20 1904 07/05/20 0023  WBC 6.8 7.1  NEUTROABS 4.6 4.7  HGB 13.3 12.9  HCT 41.3 40.6  MCV 102.7* 102.0*  PLT 197 192   BMP &GFR Recent Labs  Lab 07/04/20 1904 07/05/20 0023  NA 144 141  K 3.6 3.2*  CL 110 103  CO2 26 26  GLUCOSE 102* 117*  BUN 14 14  CREATININE 1.03* 1.08*  CALCIUM 9.0 8.7*  MG 1.6* 2.3   Estimated Creatinine Clearance: 55.5 mL/min (A) (by C-G formula based on SCr of 1.08 mg/dL (H)). Liver & Pancreas: Recent Labs  Lab 07/05/20 0023  AST 23  ALT 22  ALKPHOS 71  BILITOT 0.8  PROT 6.4*  ALBUMIN 3.3*   No results for input(s): LIPASE,  AMYLASE in the last 168 hours. No results for input(s): AMMONIA in the last 168 hours. Diabetic: No results for input(s): HGBA1C in the last 72 hours. No results for input(s): GLUCAP in the last 168 hours. Cardiac Enzymes: No results for input(s): CKTOTAL, CKMB, CKMBINDEX, TROPONINI in the last 168 hours. No results for input(s): PROBNP in the last 8760 hours. Coagulation Profile: No results for input(s): INR, PROTIME in the last 168 hours. Thyroid Function Tests: Recent Labs    07/05/20 0023  TSH 3.396   Lipid Profile: No results for input(s): CHOL, HDL, LDLCALC, TRIG, CHOLHDL, LDLDIRECT in the last 72 hours. Anemia Panel: No results for input(s): VITAMINB12, FOLATE, FERRITIN, TIBC, IRON, RETICCTPCT in the last 72 hours. Urine  analysis: No results found for: COLORURINE, APPEARANCEUR, LABSPEC, PHURINE, GLUCOSEU, HGBUR, BILIRUBINUR, KETONESUR, PROTEINUR, UROBILINOGEN, NITRITE, LEUKOCYTESUR Sepsis Labs: Invalid input(s): PROCALCITONIN, LACTICIDVEN  Microbiology: Recent Results (from the past 240 hour(s))  Resp Panel by RT PCR (RSV, Flu A&B, Covid) - Nasopharyngeal Swab     Status: None   Collection Time: 07/04/20  7:35 PM   Specimen: Nasopharyngeal Swab  Result Value Ref Range Status   SARS Coronavirus 2 by RT PCR NEGATIVE NEGATIVE Final    Comment: (NOTE) SARS-CoV-2 target nucleic acids are NOT DETECTED.  The SARS-CoV-2 RNA is generally detectable in upper respiratoy specimens during the acute phase of infection. The lowest concentration of SARS-CoV-2 viral copies this assay can detect is 131 copies/mL. A negative result does not preclude SARS-Cov-2 infection and should not be used as the sole basis for treatment or other patient management decisions. A negative result may occur with  improper specimen collection/handling, submission of specimen other than nasopharyngeal swab, presence of viral mutation(s) within the areas targeted by this assay, and inadequate number of  viral copies (<131 copies/mL). A negative result must be combined with clinical observations, patient history, and epidemiological information. The expected result is Negative.  Fact Sheet for Patients:  https://www.moore.com/  Fact Sheet for Healthcare Providers:  https://www.young.biz/  This test is no t yet approved or cleared by the [place] FDA and  has been authorized for detection and/or diagnosis of SARS-CoV-2 by FDA under an Emergency Use Authorization (EUA). This EUA will remain  in effect (meaning this test can be used) for the duration of the COVID-19 declaration under Section 564(b)(1) of the Act, 21 U.S.C. section 360bbb-3(b)(1), unless the authorization is terminated or revoked sooner.     Influenza A by PCR NEGATIVE NEGATIVE Final   Influenza B by PCR NEGATIVE NEGATIVE Final    Comment: (NOTE) The Xpert Xpress SARS-CoV-2/FLU/RSV assay is intended as an aid in  the diagnosis of influenza from Nasopharyngeal swab specimens and  should not be used as a sole basis for treatment. Nasal washings and  aspirates are unacceptable for Xpert Xpress SARS-CoV-2/FLU/RSV  testing.  Fact Sheet for Patients: https://www.moore.com/  Fact Sheet for Healthcare Providers: https://www.young.biz/  This test is not yet approved or cleared by the [place] FDA and  has been authorized for detection and/or diagnosis of SARS-CoV-2 by  FDA under an Emergency Use Authorization (EUA). This EUA will remain  in effect (meaning this test can be used) for the duration of the  Covid-19 declaration under Section 564(b)(1) of the Act, 21  U.S.C. section 360bbb-3(b)(1), unless the authorization is  terminated or revoked.    Respiratory Syncytial Virus by PCR NEGATIVE NEGATIVE Final    Comment: (NOTE) Fact Sheet for Patients: https://www.moore.com/  Fact Sheet for Healthcare  Providers: https://www.young.biz/  This test is not yet approved or cleared by the [place] FDA and  has been authorized for detection and/or diagnosis of SARS-CoV-2 by  FDA under an Emergency Use Authorization (EUA). This EUA will remain  in effect (meaning this test can be used) for the duration of the  COVID-19 declaration under Section 564(b)(1) of the Act, 21 U.S.C.  section 360bbb-3(b)(1), unless the authorization is terminated or  revoked. Performed at [facility], [address] [place], [place] [address]   MRSA PCR Screening     Status: None   Collection Time: 07/04/20 11:50 PM   Specimen: Nasal Mucosa; Nasopharyngeal  Result Value Ref Range Status   MRSA by PCR NEGATIVE  NEGATIVE Final    Comment:        The GeneXpert MRSA Assay (FDA approved for NASAL specimens only), is one component of a comprehensive MRSA colonization surveillance program. It is not intended to diagnose MRSA infection nor to guide or monitor treatment for MRSA infections. Performed at [facility], [address] [place], [place] [address]     Radiology Studies: CT ANGIO CHEST PE W OR WO CONTRAST  Result Date: 07/04/2020 CLINICAL DATA:  74 year old female with concern for pulmonary embolism. EXAM: CT ANGIOGRAPHY CHEST WITH CONTRAST TECHNIQUE: Multidetector CT imaging of the chest was performed using the standard protocol during bolus administration of intravenous contrast. Multiplanar CT image reconstructions and MIPs were obtained to evaluate the vascular anatomy. CONTRAST:  OMNIPAQUE IOHEXOL 350 MG/ML SOLN COMPARISON:  Chest radiograph dated 07/04/2020. FINDINGS: Cardiovascular: There is moderate cardiomegaly. There is dilatation of the right heart chamber with retrograde flow of contrast from the right atrium into the IVC consistent with a degree of right heart dysfunction. No pericardial effusion. 3 vessel coronary vascular calcification. There is  moderate atherosclerotic calcification of the thoracic aorta. No pulmonary artery embolus identified. Mediastinum/Nodes: Mild hilar and mediastinal adenopathies measure 14 mm in short axis in the right hilum and subcarinal region, likely reactive. The esophagus is grossly unremarkable. No mediastinal fluid collection. Lungs/Pleura: Small bilateral pleural effusions. There is background of centrilobular and paraseptal emphysema. Areas of consolidation at the left lung base and lingula may represent atelectasis or pneumonia. Clinical correlation and follow-up to resolution recommended. There is a 4 mm right lower lobe nodule (104/11). A 6 mm left lower lobe nodule is noted (91/11). Mild interstitial prominence may represent mild edema. No pneumothorax. The central airways are patent. Upper Abdomen: Partially visualized 3 cm left adrenal hypodense nodule, indeterminate, likely an adenoma. Mild irregularity of the liver contour, likely early changes of cirrhosis. Partially visualized 3 cm hypodense lesion along the medial aspect of the right lobe of the liver noted which is not evaluated. CT of the abdomen pelvis may provide better evaluation on a nonemergent/outpatient basis if clinically indicated. Musculoskeletal: Degenerative changes of the spine. No acute osseous pathology. Review of the MIP images confirms the above findings. IMPRESSION: 1. No CT evidence of pulmonary artery embolus. 2. Moderate cardiomegaly with evidence of right heart dysfunction. 3. Small bilateral pleural effusions with probable mild interstitial edema. 4. Areas of consolidation at the left lung base and lingula may represent atelectasis or pneumonia. Clinical correlation and follow-up to resolution recommended. 5. Aortic Atherosclerosis (ICD10-I70.0) and Emphysema (ICD10-J43.9). Electronically Signed   By: Elgie Collard M.D.   On: 07/04/2020 23:21   DG Chest Portable 1 View  Result Date: 07/04/2020 CLINICAL DATA:  AFib EXAM: PORTABLE  CHEST 1 VIEW COMPARISON:  None. FINDINGS: The heart size and mediastinal contours are large with mild cardiomegaly. There is diffusely increased interstitial markings throughout both lungs. A small left and trace right pleural effusion are present. No acute osseous abnormality. IMPRESSION: Cardiomegaly and interstitial edema. Small left and trace right pleural effusion. Electronically Signed   By: Jonna Clark M.D.   On: 07/04/2020 19:30   ECHOCARDIOGRAM COMPLETE  Result Date: 07/05/2020    ECHOCARDIOGRAM REPORT   Patient Name:   [facility] Hart Date of Exam: 07/05/2020 Medical Rec #:  [id number]          Height:       67.0 in Accession #:    [id number]  Weight:       207.2 lb Date of Birth:  [birth year]           BSA:          2.053 m Patient Age:    74 years           BP:           148/107 mmHg Patient Gender: F                  HR:           100 bpm. Exam Location:  Inpatient Procedure: 2D Echo, Cardiac Doppler and Color Doppler Indications:    Atrial fibrillation and Flutter; I50.40* Unspecified combined                 systolic (congestive) and diastolic (congestive) heart failure  History:        Patient has no prior history of Echocardiogram examinations.                 CHF, Previous Myocardial Infarction, Abnormal ECG, COPD,                 Arrythmias:Atrial Fibrillation, Signs/Symptoms:Shortness of                 Breath and Dyspnea; Risk Factors:Current Smoker. Pulmonary                 edema.  Sonographer:    Sheralyn Boatman RDCS Referring Phys: [id number] Deno Lunger [facility] IMPRESSIONS  1. Left ventricular ejection fraction, by estimation, is 55 to 60%. The left ventricle has normal function. The left ventricle has no regional wall motion abnormalities. There is mild concentric left ventricular hypertrophy. Left ventricular diastolic function could not be evaluated.  2. Right ventricular systolic function is mildly reduced. The right ventricular size is mildly enlarged. There is mildly elevated  pulmonary artery systolic pressure. The estimated right ventricular systolic pressure is 37.2 mmHg.  3. Left atrial size was severely dilated.  4. Right atrial size was severely dilated.  5. A small pericardial effusion is present.  6. The mitral valve is normal in structure. Trivial mitral valve regurgitation. No evidence of mitral stenosis.  7. The aortic valve is tricuspid. There is mild calcification of the aortic valve. Aortic valve regurgitation is not visualized. Mild aortic valve sclerosis is present, with no evidence of aortic valve stenosis.  8. The inferior vena cava is dilated in size with >50% respiratory variability, suggesting right atrial pressure of 8 mmHg. Comparison(s): No prior Echocardiogram. Conclusion(s)/Recommendation(s): Normal LVEF with severe biatrial enlargement, mildly elevated RVSP/mildly decreased RV function, no significant valve disease. FINDINGS  Left Ventricle: Left ventricular ejection fraction, by estimation, is 55 to 60%. The left ventricle has normal function. The left ventricle has no regional wall motion abnormalities. The left ventricular internal cavity size was normal in size. There is  mild concentric left ventricular hypertrophy. Left ventricular diastolic function could not be evaluated due to atrial fibrillation. Left ventricular diastolic function could not be evaluated. Right Ventricle: The right ventricular size is mildly enlarged. Right vetricular wall thickness was not well visualized. Right ventricular systolic function is mildly reduced. There is mildly elevated pulmonary artery systolic pressure. The tricuspid regurgitant velocity is 2.70 m/s, and with an assumed right atrial pressure of 8 mmHg, the estimated right ventricular systolic pressure is 37.2 mmHg. Left Atrium: Left atrial size was severely dilated. Right Atrium: Right atrial size was severely dilated. Pericardium: A small pericardial effusion  is present. Presence of pericardial fat pad. Mitral Valve:  The mitral valve is normal in structure. Trivial mitral valve regurgitation. No evidence of mitral valve stenosis. Tricuspid Valve: The tricuspid valve is normal in structure. Tricuspid valve regurgitation is mild . No evidence of tricuspid stenosis. Aortic Valve: The aortic valve is tricuspid. There is mild calcification of the aortic valve. Aortic valve regurgitation is not visualized. Mild aortic valve sclerosis is present, with no evidence of aortic valve stenosis. Pulmonic Valve: The pulmonic valve was not well visualized. Pulmonic valve regurgitation is not visualized. No evidence of pulmonic stenosis. Aorta: The aortic root, ascending aorta and aortic arch are all structurally normal, with no evidence of dilitation or obstruction. Venous: The inferior vena cava is dilated in size with greater than 50% respiratory variability, suggesting right atrial pressure of 8 mmHg. IAS/Shunts: No atrial level shunt detected by color flow Doppler.  LEFT VENTRICLE PLAX 2D LVIDd:         5.20 cm LVIDs:         4.00 cm LV PW:         1.60 cm LV IVS:        1.30 cm LVOT diam:     2.00 cm LV SV:         52 LV SV Index:   25 LVOT Area:     3.14 cm  LV Volumes (MOD) LV vol d, MOD A2C: 89.5 ml LV vol d, MOD A4C: 70.2 ml LV vol s, MOD A2C: 36.2 ml LV vol s, MOD A4C: 32.2 ml LV SV MOD A2C:     53.3 ml LV SV MOD A4C:     70.2 ml LV SV MOD BP:      47.5 ml RIGHT VENTRICLE            IVC RV S prime:     9.25 cm/s  IVC diam: 3.20 cm TAPSE (M-mode): 1.5 cm LEFT ATRIUM              Index       RIGHT ATRIUM           Index LA diam:        4.10 cm  2.00 cm/m  RA Area:     28.00 cm LA Vol (A2C):   178.0 ml 86.70 ml/m RA Volume:   86.90 ml  42.33 ml/m LA Vol (A4C):   109.0 ml 53.09 ml/m LA Biplane Vol: 138.0 ml 67.22 ml/m  AORTIC VALVE LVOT Vmax:   91.40 cm/s LVOT Vmean:  68.600 cm/s LVOT VTI:    0.165 m  AORTA Ao Root diam: 3.40 cm MITRAL VALVE                TRICUSPID VALVE MV Area (PHT): 4.65 cm     TR Peak grad:   29.2 mmHg MV  Decel Time: 163 msec     TR Vmax:        270.00 cm/s MV E velocity: 123.33 cm/s                             SHUNTS                             Systemic VTI:  0.16 m  Systemic Diam: 2.00 cm Jodelle Red MD Electronically signed by Jodelle Red MD Signature Date/Time: 07/05/2020/12:59:37 PM    Final       Boyce Medici. Temitope Flammer [facility]  If 7PM-7AM, please contact night-coverage [email or web address] 07/05/2020, 2:30 PM

## 2020-07-05 NOTE — Progress Notes (Signed)
  Echocardiogram 2D Echocardiogram has been performed.  Janalyn Harder 07/05/2020, 9:41 AM

## 2020-07-05 NOTE — Progress Notes (Signed)
Bilateral lower extremity venous duplex has been completed. Preliminary results can be found in CV Proc through chart review.   07/05/20 3:55 PM Olen Cordial RVT

## 2020-07-05 NOTE — Plan of Care (Signed)
  Problem: Education: Goal: Knowledge of General Education information will improve Description: Including pain rating scale, medication(s)/side effects and non-pharmacologic comfort measures Outcome: Progressing   Problem: Health Behavior/Discharge Planning: Goal: Ability to manage health-related needs will improve Outcome: Progressing   Problem: Clinical Measurements: Goal: Ability to maintain clinical measurements within normal limits will improve Outcome: Progressing   Problem: Clinical Measurements: Goal: Diagnostic test results will improve Outcome: Progressing   Problem: Clinical Measurements: Goal: Cardiovascular complication will be avoided Outcome: Progressing   Problem: Clinical Measurements: Goal: Respiratory complications will improve Outcome: Progressing   Problem: Pain Managment: Goal: General experience of comfort will improve Outcome: Progressing

## 2020-07-05 NOTE — Plan of Care (Signed)
°  Problem: Education: Goal: Knowledge of General Education information will improve Description: Including pain rating scale, medication(s)/side effects and non-pharmacologic comfort measures Outcome: Progressing   Problem: Health Behavior/Discharge Planning: Goal: Ability to manage health-related needs will improve Outcome: Progressing   Problem: Clinical Measurements: Goal: Ability to maintain clinical measurements within normal limits will improve Outcome: Progressing   Problem: Clinical Measurements: Goal: Diagnostic test results will improve Outcome: Progressing   Problem: Clinical Measurements: Goal: Respiratory complications will improve Outcome: Progressing   Problem: Activity: Goal: Risk for activity intolerance will decrease Outcome: Progressing   Problem: Nutrition: Goal: Adequate nutrition will be maintained Outcome: Progressing   Problem: Elimination: Goal: Will not experience complications related to urinary retention Outcome: Progressing   Problem: Pain Managment: Goal: General experience of comfort will improve Outcome: Progressing

## 2020-07-06 DIAGNOSIS — I509 Heart failure, unspecified: Secondary | ICD-10-CM

## 2020-07-06 DIAGNOSIS — I4891 Unspecified atrial fibrillation: Secondary | ICD-10-CM | POA: Diagnosis not present

## 2020-07-06 DIAGNOSIS — I5031 Acute diastolic (congestive) heart failure: Secondary | ICD-10-CM | POA: Diagnosis not present

## 2020-07-06 DIAGNOSIS — I501 Left ventricular failure: Secondary | ICD-10-CM | POA: Diagnosis not present

## 2020-07-06 LAB — CBC
HCT: 40 % (ref 36.0–46.0)
Hemoglobin: 12.9 g/dL (ref 12.0–15.0)
MCH: 32.9 pg (ref 26.0–34.0)
MCHC: 32.3 g/dL (ref 30.0–36.0)
MCV: 102 fL — ABNORMAL HIGH (ref 80.0–100.0)
Platelets: 210 10*3/uL (ref 150–400)
RBC: 3.92 MIL/uL (ref 3.87–5.11)
RDW: 16.2 % — ABNORMAL HIGH (ref 11.5–15.5)
WBC: 10.9 10*3/uL — ABNORMAL HIGH (ref 4.0–10.5)
nRBC: 0 % (ref 0.0–0.2)

## 2020-07-06 LAB — RENAL FUNCTION PANEL
Albumin: 3.3 g/dL — ABNORMAL LOW (ref 3.5–5.0)
Anion gap: 13 (ref 5–15)
BUN: 14 mg/dL (ref 8–23)
CO2: 27 mmol/L (ref 22–32)
Calcium: 9.1 mg/dL (ref 8.9–10.3)
Chloride: 96 mmol/L — ABNORMAL LOW (ref 98–111)
Creatinine, Ser: 1.21 mg/dL — ABNORMAL HIGH (ref 0.44–1.00)
GFR, Estimated: 48 mL/min — ABNORMAL LOW (ref >60)
Glucose, Bld: 145 mg/dL — ABNORMAL HIGH (ref 70–99)
Phosphorus: 2.7 mg/dL (ref 2.5–4.6)
Potassium: 4.3 mmol/L (ref 3.5–5.1)
Sodium: 136 mmol/L (ref 135–145)

## 2020-07-06 LAB — MAGNESIUM: Magnesium: 1.8 mg/dL (ref 1.7–2.4)

## 2020-07-06 MED ORDER — UMECLIDINIUM-VILANTEROL 62.5-25 MCG/INH IN AEPB
1.0000 | INHALATION_SPRAY | Freq: Every day | RESPIRATORY_TRACT | Status: DC
  Administered 2020-07-06 – 2020-07-07 (×2): 1 via RESPIRATORY_TRACT
  Filled 2020-07-06: qty 14

## 2020-07-06 MED ORDER — DILTIAZEM HCL ER COATED BEADS 180 MG PO CP24
180.0000 mg | ORAL_CAPSULE | Freq: Every day | ORAL | Status: DC
  Administered 2020-07-06 – 2020-07-07 (×2): 180 mg via ORAL
  Filled 2020-07-06 (×2): qty 1

## 2020-07-06 MED ORDER — FUROSEMIDE 40 MG PO TABS
40.0000 mg | ORAL_TABLET | Freq: Two times a day (BID) | ORAL | Status: DC
  Administered 2020-07-06 – 2020-07-07 (×3): 40 mg via ORAL
  Filled 2020-07-06 (×3): qty 1

## 2020-07-06 MED ORDER — IPRATROPIUM BROMIDE 0.02 % IN SOLN: 0.5000 mg | Freq: Four times a day (QID) | RESPIRATORY_TRACT | Status: DC | PRN

## 2020-07-06 MED ORDER — DILTIAZEM HCL 60 MG PO TABS: 30.0000 mg | ORAL_TABLET | Freq: Four times a day (QID) | ORAL | Status: DC | PRN

## 2020-07-06 NOTE — Progress Notes (Signed)
°  ReDS Clip Diuretic Study Pt study # [id number]  Your patient has been enrolled in the ReDS Clip Diuretic Study  Weight on admission 208 lbs, weight is back up 1 lb today to 208 lbs. SCr increased 1.08>1.21 (CrCl ~ 50 mL/min). Was receiving lasix 40 mg IV q12h. Not on diuretics PTA.  Changes to prescribed diuretics recommended:  Agree with switching to oral lasix 40 mg BID today Provider contacted: Dr. Alanda Slim Recommendation was accepted by provider.    REDS Clip  READING= 29% (normal 25-35%)  CHEST RULER = 32 Clip Station = D   Orthodema score = 1 Signs/Symptoms Score   Mild edema, no orthopnea 0 No congestion  Moderate edema, no orthopnea 1 Low-grade orthodema/congestion  Severe edema OR orthopnea 2   Moderate edema and orthopnea 3 High-grade orthodema/congestion  Severe edema AND orthopnea 4    Sharen Hones, PharmD, BCPS Heart Failure Stewardship Pharmacist Phone [phone]  Please check [email or web address] for unit-specific pharmacist phone numbers

## 2020-07-06 NOTE — Plan of Care (Signed)

## 2020-07-06 NOTE — TOC Benefit Eligibility Note (Signed)
Transition of Care [facility]) Benefit Eligibility Note    Patient Details  Name: Sherri Hart MRN: [id number] Date of Birth: 05/30/1946   Medication/Dose: Eliquis 2.5mg  and 5mg . bid for 30 day supply  Covered?: Yes  Tier: 3 Drug  Prescription Coverage Preferred Pharmacy: [facility], [facility].  Spoke with Person/Company/Phone Number:: Jessica L. W[employer] PH#[phone]  Co-Pay: $47.00  Prior Approval: No  Deductible:  (No Deductible on this plan.)       11-05-1969 Phone Number: 07/06/2020, 9:25 AM

## 2020-07-06 NOTE — Evaluation (Signed)
Physical Therapy Evaluation Patient Details Name: Sherri Hart MRN: [id number] DOB: [birth year] Today's Date: 07/06/2020   History of Present Illness  74 year old female with history of A. fib, tobacco use, COPD not on oxygen and osteoarthritis presenting with shortness of breath, orthopnea leg swelling and admitted for A. fib with RVR and acute on chronic CHF. In ED, in A. fib with RVR.  CXR with bilateral opacities concerning for acute cardiogenic pulmonary edema.Admitted 07/04/20 for treatment of acute hypoxemic respiratory failure likely due to CHF and A fib  Clinical Impression  Pt is originally from [facility] and is in [place] to take care of her great grand children after a death in her family. 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 Pt is currently limited in safe mobility by increased O2 demand (see General Comments) in presence of decreased strength and endurance. Pt is supervision for bed mobility and min guard for transfers and ambulation of 80 feet with RW. PT recommends HHPT for improving strength and endurance and Rollator to assist in energy conservation. PT will continue to follow acutely.      Follow Up Recommendations Home health PT    Equipment Recommendations  Other (comment) Aeronautical engineer)    Recommendations for Other Services       Precautions / Restrictions Precautions Precautions: None Restrictions Weight Bearing Restrictions: No      Mobility  Bed Mobility Overal bed mobility: Needs Assistance Bed Mobility: Supine to Sit     Supine to sit: Supervision     General bed mobility comments: supervision for safety and management of lines    Transfers Overall transfer level: Needs assistance Equipment used: Rolling walker (2 wheeled) Transfers: Sit to/from Stand Sit to Stand: Min guard         General transfer comment: min guard for safety with power up and steadying with RW, vc for hand placement    Ambulation/Gait Ambulation/Gait assistance: Min guard Gait Distance (Feet): 80 Feet Assistive device: Rolling walker (2 wheeled) Gait Pattern/deviations: Step-through pattern;Decreased step length - right;Decreased step length - left;Trunk flexed;Shuffle Gait velocity: slowed   General Gait Details: min guard for slow, shuffling gait, pt with increased work of breathing during ambulation vc for proximity to RW and upright posture        Balance Overall balance assessment: Needs assistance Sitting-balance support: Feet supported;No upper extremity supported Sitting balance-Leahy Scale: Good     Standing balance support: No upper extremity supported;Bilateral upper extremity supported;Single extremity supported;During functional activity Standing balance-Leahy Scale: Fair                               Pertinent Vitals/Pain Pain Assessment: 0-10 Pain Score: 7  Pain Location: B LE from edema Pain Descriptors / Indicators: Pressure Pain Intervention(s): Limited activity within patient's tolerance;Monitored during session;Repositioned    Home Living Family/patient expects to be discharged to:: Private residence Living Arrangements: Other relatives Available Help at Discharge: Family;Available 24 hours/day Type of Home: Apartment Home Access: Stairs to enter   Entrance Stairs-Number of Steps: 5 Home Layout: One level Home Equipment: Cane - single point      Prior Function Level of Independence: Independent with assistive device(s)                  Extremity/Trunk Assessment   Upper Extremity Assessment Upper Extremity Assessment: Defer to OT evaluation    Lower Extremity Assessment  Lower Extremity Assessment: RLE deficits/detail;LLE deficits/detail RLE Deficits / Details: R TKA, lacks full extension    Cervical / Trunk Assessment Cervical / Trunk Assessment: Kyphotic  Communication   Communication: No difficulties  Cognition  Arousal/Alertness: Awake/alert Behavior During Therapy: WFL for tasks assessed/performed Overall Cognitive Status: Within Functional Limits for tasks assessed                                        General Comments General comments (skin integrity, edema, etc.): Pt with SaO2 90%O2 on R on entry, wiith poor waveform, with moving around and new O2 monitor SaO2 in mid80%s O2, provided 2L supplemental O2 via [place], SaO2 88-90%O2 increased to 3 L for ambulation and able to maintain SaO2 >90%O2, on entry HR 96 bpm with ambulation HR 116-163bpm, evidence of increased swelling in LE present        Assessment/Plan    PT Assessment Patient needs continued PT services  PT Problem List Decreased strength;Decreased activity tolerance;Decreased range of motion;Decreased mobility;Decreased balance;Cardiopulmonary status limiting activity;Decreased knowledge of use of DME;Pain       PT Treatment Interventions DME instruction;Gait training;Stair training;Functional mobility training;Therapeutic activities;Therapeutic exercise;Balance training;Cognitive remediation;Patient/family education    PT Goals (Current goals can be found in the Care Plan section)  Acute Rehab PT Goals Patient Stated Goal: get better to take care of great grandchildren PT Goal Formulation: With patient Time For Goal Achievement: 07/20/20 Potential to Achieve Goals: Good    Frequency Min 3X/week    AM-PAC PT "6 Clicks" Mobility  Outcome Measure Help needed turning from your back to your side while in a flat bed without using bedrails?: None Help needed moving from lying on your back to sitting on the side of a flat bed without using bedrails?: None Help needed moving to and from a bed to a chair (including a wheelchair)?: None Help needed standing up from a chair using your arms (e.g., wheelchair or bedside chair)?: None Help needed to walk in hospital room?: A Little Help needed climbing 3-5 steps with a  railing? : A Little 6 Click Score: 22    End of Session Equipment Utilized During Treatment: Gait belt;Oxygen Activity Tolerance: Patient tolerated treatment well Patient left: in chair;with call bell/phone within reach Nurse Communication: Mobility status;Other (comment) (need for supplemental O2, left on 2L via [place]) PT Visit Diagnosis: Other abnormalities of gait and mobility (R26.89);Muscle weakness (generalized) (M62.81);Difficulty in walking, not elsewhere classified (R26.2);Pain Pain - Right/Left:  (bilateral ) Pain - part of body: Leg    Time: 4944-9675 PT Time Calculation (min) (ACUTE ONLY): 17 min   Charges:   PT Evaluation $PT Eval Moderate Complexity: 1 Mod          Valyn Latchford B. Beverely Risen PT, DPT Acute Rehabilitation Services Pager [phone] Office [phone]   Elon Alas Fleet 07/06/2020, 4:18 PM

## 2020-07-06 NOTE — Progress Notes (Signed)
PROGRESS NOTE  Sherri Hart Block WUJ:[id number] DOB: 07-28-1946   PCP: Patient, No Pcp Per  Patient is from: Home  DOA: 07/04/2020 LOS: 1  Chief complaints: Shortness of breath  Brief Narrative / Interim history: 74 year old female with history of A. fib, tobacco use, COPD not on oxygen and osteoarthritis presenting with shortness of breath, orthopnea leg swelling and admitted for A. fib with RVR and acute on chronic CHF.  Originally from [place].  Came to [facility] to take care of grandchildren due to daughter's untimely death.  She lost her medications with her luggage.  In ED, in A. fib with RVR.  CXR with bilateral opacities concerning for acute cardiogenic pulmonary edema.  Started on Cardizem drip, IV Lasix and IV heparin and admitted.  Patient was transitioned to p.o. Cardizem.  Continue to diurese well on IV Lasix as well.  Echocardiogram without significant finding.  Lower extremity Doppler negative for DVT.  Subjective: Seen and examined earlier this morning.  No major events overnight of this morning.  Reports improvement in her breathing, wheezing and edema.  HR ranged from 100-1 110s.  Not symptomatic.  Excellent urine output with IV Lasix.  Creatinine slightly up.  Still requiring 4 L  Objective: Vitals:   07/06/20 0514 07/06/20 0729 07/06/20 0828 07/06/20 1045  BP: 124/76 124/82  121/83  Pulse:  98  96  Resp:  20  18  Temp:  97.9 F (36.6 C)  97.7 F (36.5 C)  TempSrc:  Oral  Oral  SpO2:  92% 96% 97%  Weight:      Height:        Intake/Output Summary (Last 24 hours) at 07/06/2020 1207 Last data filed at 07/06/2020 0730 Gross per 24 hour  Intake 1678.15 ml  Output 2900 ml  Net -1221.85 ml   Filed Weights   07/04/20 2330 07/05/20 0500 07/06/20 0456  Weight: 94.5 kg 94 kg 94.4 kg    Examination:  GENERAL: No apparent distress.  Nontoxic. HEENT: MMM.  Vision and hearing grossly intact.  NECK: Supple.  No apparent JVD.  RESP: 94% on 4 L.  No IWOB.   Fair aeration bilaterally. CVS:  RRR. Heart sounds normal.  ABD/GI/GU: BS+. Abd soft, NTND.  MSK/EXT:  Moves extremities. No apparent deformity.  Trace edema bilateral. SKIN: no apparent skin lesion or wound NEURO: Awake, alert and oriented appropriately.  No apparent focal neuro deficit. PSYCH: Calm. Normal affect.  Procedures:  None  Microbiology summarized: COVID-19 PCR negative. Influenza PCR negative. MRSA PCR negative.  Assessment & Plan: Acute hypoxemic respiratory failure likely due to CHF and A. fib: Not on oxygen at home.  Resting saturation ranges from 92 to 97% on 4 L.  -Treat CHF and A. fib as below -Wean oxygen as able -Incentive spirometry, OOB, PT/OT -Ambulatory saturation assessment  Acute diastolic CHF: Echo with EF of 55 to 60%, mild concentric LVH, severe LAE, severe RAE, RVSP of 37.2 and a small pericardial effusion.  Has cardinal symptoms including dyspnea, orthopnea or lower extremity edema.  Elevated BNP to 500.  CXR with mild bilateral pulmonary edema.  Improved with IV Lasix.  4.1 L / 24 hours.  Net -4 L so far.  Slight bump in creatinine.  -Transition to p.o. Lasix 40 mg twice daily-could be discharged on this regimen tomorrow if stable. -Monitor fluid status, renal function and electrolytes. -Fluid and sodium restrictions.   Paroxysmal A. fib with RVR: Likely provoked by CHF and missed doses of her meds.  HR  ranges from 90s to 110. -Consolidate p.o. Cardizem CD 180 mg daily -P.o. Cardizem 30 mg every 6 hours as needed -Continue Eliquis for anticoagulation  Chronic COPD: Doubt exacerbation.  Respiratory symptoms likely due to CHF versus COPD. -As needed Xopenex and Atrovent -Start Anoro Ellipta -Wean oxygen as able  Elevated troponin: Flat without significant delta.  Likely demand ischemia from CHF and A. fib versus ACS.  No chest pain.  Echocardiogram reassuring. -No further work-up required  Hypokalemia/hypomagnesemia: Resolved. -Recheck in the  morning  Grief-recently lost her daughter -Emotional support.  BLE swelling/Calf tenderness: BLE Doppler negative for DVT.  Swelling improved.  Debility: Uses cane at baseline. -PT/OT  Tobacco use disorder: -Encourage cessation -Nicotine patch.  Class I obesity Body mass index is 32.6 kg/m.         DVT prophylaxis:   apixaban (ELIQUIS) tablet 5 mg  Code Status: Full code Family Communication: Patient and/or RN. Available if any question.  Status is: Inpatient  Remains inpatient appropriate because:Hemodynamically unstable, Unsafe d/c plan and Inpatient level of care appropriate due to severity of illness   Dispo: The patient is from: Home              Anticipated d/c is to: Home              Anticipated d/c date is: 1 day if remains stable on current cardiac regimen              Patient currently is not medically stable to d/c.           Consultants:  None   Sch Meds:  Scheduled Meds: . apixaban  5 mg Oral BID  . diltiazem  180 mg Oral Daily  . furosemide  40 mg Oral BID  . nicotine  21 mg Transdermal Daily  . umeclidinium-vilanterol  1 puff Inhalation Daily   Continuous Infusions:  PRN Meds:.acetaminophen, diltiazem, guaiFENesin, ipratropium, levalbuterol, ondansetron (ZOFRAN) IV, oxyCODONE-acetaminophen, polyethylene glycol  Antimicrobials: Anti-infectives (From admission, onward)   None       I have personally reviewed the following labs and images: CBC: Recent Labs  Lab 07/04/20 1904 07/05/20 0023 07/06/20 0023  WBC 6.8 7.1 10.9*  NEUTROABS 4.6 4.7  --   HGB 13.3 12.9 12.9  HCT 41.3 40.6 40.0  MCV 102.7* 102.0* 102.0*  PLT 197 192 210   BMP &GFR Recent Labs  Lab 07/04/20 1904 07/05/20 0023 07/06/20 0023  NA 144 141 136  K 3.6 3.2* 4.3  CL 110 103 96*  CO2 26 26 27   GLUCOSE 102* 117* 145*  BUN 14 14 14   CREATININE 1.03* 1.08* 1.21*  CALCIUM 9.0 8.7* 9.1  MG 1.6* 2.3 1.8  PHOS  --   --  2.7   Estimated Creatinine  Clearance: 49.6 mL/min (A) (by C-G formula based on SCr of 1.21 mg/dL (H)). Liver & Pancreas: Recent Labs  Lab 07/05/20 0023 07/06/20 0023  AST 23  --   ALT 22  --   ALKPHOS 71  --   BILITOT 0.8  --   PROT 6.4*  --   ALBUMIN 3.3* 3.3*   No results for input(s): LIPASE, AMYLASE in the last 168 hours. No results for input(s): AMMONIA in the last 168 hours. Diabetic: No results for input(s): HGBA1C in the last 72 hours. No results for input(s): GLUCAP in the last 168 hours. Cardiac Enzymes: No results for input(s): CKTOTAL, CKMB, CKMBINDEX, TROPONINI in the last 168 hours. No results for input(s): PROBNP in  the last 8760 hours. Coagulation Profile: No results for input(s): INR, PROTIME in the last 168 hours. Thyroid Function Tests: Recent Labs    07/05/20 0023  TSH 3.396   Lipid Profile: No results for input(s): CHOL, HDL, LDLCALC, TRIG, CHOLHDL, LDLDIRECT in the last 72 hours. Anemia Panel: No results for input(s): VITAMINB12, FOLATE, FERRITIN, TIBC, IRON, RETICCTPCT in the last 72 hours. Urine analysis: No results found for: COLORURINE, APPEARANCEUR, LABSPEC, PHURINE, GLUCOSEU, HGBUR, BILIRUBINUR, KETONESUR, PROTEINUR, UROBILINOGEN, NITRITE, LEUKOCYTESUR Sepsis Labs: Invalid input(s): PROCALCITONIN, LACTICIDVEN  Microbiology: Recent Results (from the past 240 hour(s))  Resp Panel by RT PCR (RSV, Flu A&B, Covid) - Nasopharyngeal Swab     Status: None   Collection Time: 07/04/20  7:35 PM   Specimen: Nasopharyngeal Swab  Result Value Ref Range Status   SARS Coronavirus 2 by RT PCR NEGATIVE NEGATIVE Final    Comment: (NOTE) SARS-CoV-2 target nucleic acids are NOT DETECTED.  The SARS-CoV-2 RNA is generally detectable in upper respiratoy specimens during the acute phase of infection. The lowest concentration of SARS-CoV-2 viral copies this assay can detect is 131 copies/mL. A negative result does not preclude SARS-Cov-2 infection and should not be used as the sole basis  for treatment or other patient management decisions. A negative result may occur with  improper specimen collection/handling, submission of specimen other than nasopharyngeal swab, presence of viral mutation(s) within the areas targeted by this assay, and inadequate number of viral copies (<131 copies/mL). A negative result must be combined with clinical observations, patient history, and epidemiological information. The expected result is Negative.  Fact Sheet for Patients:  https://www.moore.com/  Fact Sheet for Healthcare Providers:  https://www.young.biz/  This test is no t yet approved or cleared by the [place] FDA and  has been authorized for detection and/or diagnosis of SARS-CoV-2 by FDA under an Emergency Use Authorization (EUA). This EUA will remain  in effect (meaning this test can be used) for the duration of the COVID-19 declaration under Section 564(b)(1) of the Act, 21 U.S.C. section 360bbb-3(b)(1), unless the authorization is terminated or revoked sooner.     Influenza A by PCR NEGATIVE NEGATIVE Final   Influenza B by PCR NEGATIVE NEGATIVE Final    Comment: (NOTE) The Xpert Xpress SARS-CoV-2/FLU/RSV assay is intended as an aid in  the diagnosis of influenza from Nasopharyngeal swab specimens and  should not be used as a sole basis for treatment. Nasal washings and  aspirates are unacceptable for Xpert Xpress SARS-CoV-2/FLU/RSV  testing.  Fact Sheet for Patients: https://www.moore.com/  Fact Sheet for Healthcare Providers: https://www.young.biz/  This test is not yet approved or cleared by the [place] FDA and  has been authorized for detection and/or diagnosis of SARS-CoV-2 by  FDA under an Emergency Use Authorization (EUA). This EUA will remain  in effect (meaning this test can be used) for the duration of the  Covid-19 declaration under Section 564(b)(1) of the Act, 21   U.S.C. section 360bbb-3(b)(1), unless the authorization is  terminated or revoked.    Respiratory Syncytial Virus by PCR NEGATIVE NEGATIVE Final    Comment: (NOTE) Fact Sheet for Patients: https://www.moore.com/  Fact Sheet for Healthcare Providers: https://www.young.biz/  This test is not yet approved or cleared by the [place] FDA and  has been authorized for detection and/or diagnosis of SARS-CoV-2 by  FDA under an Emergency Use Authorization (EUA). This EUA will remain  in effect (meaning this test can be used) for the duration of the  COVID-19 declaration under  Section 564(b)(1) of the Act, 21 U.S.C.  section 360bbb-3(b)(1), unless the authorization is terminated or  revoked. Performed at [facility], [address] [place], [place] [address]   MRSA PCR Screening     Status: None   Collection Time: 07/04/20 11:50 PM   Specimen: Nasal Mucosa; Nasopharyngeal  Result Value Ref Range Status   MRSA by PCR NEGATIVE NEGATIVE Final    Comment:        The GeneXpert MRSA Assay (FDA approved for NASAL specimens only), is one component of a comprehensive MRSA colonization surveillance program. It is not intended to diagnose MRSA infection nor to guide or monitor treatment for MRSA infections. Performed at [facility], [address] [place], [place] [address]     Radiology Studies: VAS [place] LOWER EXTREMITY VENOUS (DVT)  Result Date: 07/05/2020  Lower Venous DVT Study Indications: Swelling.  Risk Factors: None identified. Limitations: Poor ultrasound/tissue interface. Comparison Study: No prior studies. Performing Technologist: Chanda Busing RVT  Examination Guidelines: A complete evaluation includes B-mode imaging, spectral Doppler, color Doppler, and power Doppler as needed of all accessible portions of each vessel. Bilateral testing is considered an integral part of a complete examination. Limited examinations for  reoccurring indications may be performed as noted. The reflux portion of the exam is performed with the patient in reverse Trendelenburg.  +---------+---------------+---------+-----------+----------+--------------+ RIGHT    CompressibilityPhasicitySpontaneityPropertiesThrombus Aging +---------+---------------+---------+-----------+----------+--------------+ CFV      Full           Yes      Yes                                 +---------+---------------+---------+-----------+----------+--------------+ SFJ      Full                                                        +---------+---------------+---------+-----------+----------+--------------+ FV Prox  Full                                                        +---------+---------------+---------+-----------+----------+--------------+ FV Mid   Full                                                        +---------+---------------+---------+-----------+----------+--------------+ FV DistalFull                                                        +---------+---------------+---------+-----------+----------+--------------+ PFV      Full                                                        +---------+---------------+---------+-----------+----------+--------------+  POP      Full           Yes      Yes                                 +---------+---------------+---------+-----------+----------+--------------+ PTV      Full                                                        +---------+---------------+---------+-----------+----------+--------------+ PERO     Full                                                        +---------+---------------+---------+-----------+----------+--------------+   +---------+---------------+---------+-----------+----------+--------------+ LEFT     CompressibilityPhasicitySpontaneityPropertiesThrombus Aging  +---------+---------------+---------+-----------+----------+--------------+ CFV      Full           Yes      Yes                                 +---------+---------------+---------+-----------+----------+--------------+ SFJ      Full                                                        +---------+---------------+---------+-----------+----------+--------------+ FV Prox  Full                                                        +---------+---------------+---------+-----------+----------+--------------+ FV Mid   Full                                                        +---------+---------------+---------+-----------+----------+--------------+ FV DistalFull                                                        +---------+---------------+---------+-----------+----------+--------------+ PFV      Full                                                        +---------+---------------+---------+-----------+----------+--------------+ POP      Full           Yes      Yes                                 +---------+---------------+---------+-----------+----------+--------------+  PTV      Full                                                        +---------+---------------+---------+-----------+----------+--------------+ PERO     Full                                                        +---------+---------------+---------+-----------+----------+--------------+     Summary: RIGHT: - There is no evidence of deep vein thrombosis in the lower extremity. However, portions of this examination were limited- see technologist comments above.  - No cystic structure found in the popliteal fossa.  LEFT: - There is no evidence of deep vein thrombosis in the lower extremity. However, portions of this examination were limited- see technologist comments above.  - No cystic structure found in the popliteal fossa.  *See table(s) above for measurements and observations.  Electronically signed by Coral Else MD on 07/05/2020 at 8:52:43 PM.    Final       Harvey Matlack T. Daxtin Leiker [facility]  If 7PM-7AM, please contact night-coverage [email or web address] 07/06/2020, 12:07 PM

## 2020-07-06 NOTE — Evaluation (Signed)
Occupational Therapy Evaluation Patient Details Name: Sherri Hart MRN: [id number] DOB: Nov 01, 1945 Today's Date: 07/06/2020    History of Present Illness 74 year old female with history of A. fib, tobacco use, COPD not on oxygen and osteoarthritis presenting with shortness of breath, orthopnea leg swelling and admitted for A. fib with RVR and acute on chronic CHF. In ED, in A. fib with RVR.  CXR with bilateral opacities concerning for acute cardiogenic pulmonary edema.Admitted 07/04/20 for treatment of acute hypoxemic respiratory failure likely due to CHF and A fib   Clinical Impression   Pt walks with a cane and is independent in ADL and IADL at her baseline. Pt presents with pain in LEs, decreased activity tolerance and impaired standing balance. She requires up to min guard assist for ADL and mobility. Began educating pt in pursed lip breathing. Will follow to instruct in energy conservation strategies and for ADL training.    Follow Up Recommendations  Home health OT;Supervision - Intermittent    Equipment Recommendations  Tub/shower bench    Recommendations for Other Services       Precautions / Restrictions Precautions Precautions: Fall Precaution Comments: watch 02 Restrictions Weight Bearing Restrictions: No      Mobility Bed Mobility Overal bed mobility: Needs Assistance Bed Mobility: Supine to Sit     Supine to sit: Supervision     General bed mobility comments: supervision for safety and management of lines    Transfers Overall transfer level: Needs assistance Equipment used: Rolling walker (2 wheeled) Transfers: Sit to/from Stand Sit to Stand: Min guard         General transfer comment: min guard for safety with power up and steadying with RW, vc for hand placement     Balance Overall balance assessment: Needs assistance Sitting-balance support: Feet supported;No upper extremity supported Sitting balance-Leahy Scale: Good     Standing  balance support: No upper extremity supported;Bilateral upper extremity supported;Single extremity supported;During functional activity Standing balance-Leahy Scale: Fair                             ADL either performed or assessed with clinical judgement   ADL Overall ADL's : Needs assistance/impaired Eating/Feeding: Independent   Grooming: Min guard;Standing   Upper Body Bathing: Set up;Sitting   Lower Body Bathing: Min guard;Sit to/from stand   Upper Body Dressing : Set up;Sitting   Lower Body Dressing: Min guard;Sit to/from stand   Toilet Transfer: Min guard;Ambulation;RW   Toileting- Architect and Hygiene: Min guard;Sit to/from stand       Functional mobility during ADLs: Min guard;Rolling walker General ADL Comments: instructed in pursed lip breathing     Vision Baseline Vision/History: Wears glasses Wears Glasses: Reading only Patient Visual Report: No change from baseline       Perception     Praxis      Pertinent Vitals/Pain Pain Assessment: 0-10 Pain Score: 7  Pain Location: B LE from edema Pain Descriptors / Indicators: Pressure Pain Intervention(s): Monitored during session;Repositioned     Hand Dominance Right   Extremity/Trunk Assessment Upper Extremity Assessment Upper Extremity Assessment: Overall WFL for tasks assessed   Lower Extremity Assessment Lower Extremity Assessment: Defer to PT evaluation RLE Deficits / Details: R TKA, lacks full extension   Cervical / Trunk Assessment Cervical / Trunk Assessment: Kyphotic   Communication Communication Communication: No difficulties   Cognition Arousal/Alertness: Awake/alert Behavior During Therapy: WFL for tasks assessed/performed Overall Cognitive Status: Within Functional  Limits for tasks assessed                                     General Comments  Pt with SaO2 90%O2 on R on entry, wiith poor waveform, with moving around and new O2 monitor SaO2  in mid80%s O2, provided 2L supplemental O2 via [place], SaO2 88-90%O2 increased to 3 L for ambulation and able to maintain SaO2 >90%O2, on entry HR 96 bpm with ambulation HR 116-163bpm, evidence of increased swelling in LE present    Exercises     Shoulder Instructions      Home Living Family/patient expects to be discharged to:: Private residence Living Arrangements: Other relatives Available Help at Discharge: Family;Available 24 hours/day Type of Home: Apartment Home Access: Stairs to enter Entrance Stairs-Number of Steps: 5   Home Layout: One level     Bathroom Shower/Tub: Chief Strategy Officer: Standard Bathroom Accessibility: Yes   Home Equipment: Cane - single point          Prior Functioning/Environment Level of Independence: Independent with assistive device(s)                 OT Problem List: Impaired balance (sitting and/or standing);Decreased knowledge of use of DME or AE;Cardiopulmonary status limiting activity;Pain      OT Treatment/Interventions: Self-care/ADL training;Energy conservation;DME and/or AE instruction;Patient/family education;Balance training;Therapeutic activities    OT Goals(Current goals can be found in the care plan section) Acute Rehab OT Goals Patient Stated Goal: get better to take care of great grandchildren OT Goal Formulation: With patient Time For Goal Achievement: 07/20/20 Potential to Achieve Goals: Good ADL Goals Pt Will Perform Grooming: with modified independence;standing Pt Will Perform Lower Body Bathing: with modified independence;sit to/from stand Pt Will Perform Lower Body Dressing: with modified independence;sit to/from stand Pt Will Transfer to Toilet: with modified independence;ambulating;regular height toilet Pt Will Perform Toileting - Clothing Manipulation and hygiene: with modified independence;sit to/from stand Additional ADL Goal #1: Pt will implement energy conservation strategies in ADL  independently.  OT Frequency: Min 2X/week   Barriers to D/C:            Co-evaluation              AM-PAC OT "6 Clicks" Daily Activity     Outcome Measure Help from another person eating meals?: None Help from another person taking care of personal grooming?: A Little Help from another person toileting, which includes using toliet, bedpan, or urinal?: A Little Help from another person bathing (including washing, rinsing, drying)?: A Little Help from another person to put on and taking off regular upper body clothing?: None Help from another person to put on and taking off regular lower body clothing?: A Little 6 Click Score: 20   End of Session Equipment Utilized During Treatment: Gait belt;Rolling walker;Oxygen (3L)  Activity Tolerance: Patient tolerated treatment well Patient left: in chair;with call bell/phone within reach  OT Visit Diagnosis: Unsteadiness on feet (R26.81);Other abnormalities of gait and mobility (R26.89);Pain;Muscle weakness (generalized) (M62.81);Other (comment) (decreased activity tolerance)                Time: 2956-2130 OT Time Calculation (min): 17 min Charges:  OT General Charges $OT Visit: 1 Visit OT Evaluation $OT Eval Moderate Complexity: 1 Mod  Martie Round, OTR/L Acute Rehabilitation Services Pager: [phone] Office: [phone]  Evern Bio 07/06/2020, 4:58 PM

## 2020-07-07 DIAGNOSIS — I4891 Unspecified atrial fibrillation: Secondary | ICD-10-CM | POA: Diagnosis not present

## 2020-07-07 DIAGNOSIS — I5031 Acute diastolic (congestive) heart failure: Secondary | ICD-10-CM | POA: Diagnosis not present

## 2020-07-07 DIAGNOSIS — I501 Left ventricular failure: Secondary | ICD-10-CM | POA: Diagnosis not present

## 2020-07-07 DIAGNOSIS — J449 Chronic obstructive pulmonary disease, unspecified: Secondary | ICD-10-CM | POA: Diagnosis not present

## 2020-07-07 DIAGNOSIS — F4321 Adjustment disorder with depressed mood: Secondary | ICD-10-CM

## 2020-07-07 LAB — RENAL FUNCTION PANEL
Albumin: 3.3 g/dL — ABNORMAL LOW (ref 3.5–5.0)
Anion gap: 10 (ref 5–15)
BUN: 19 mg/dL (ref 8–23)
CO2: 33 mmol/L — ABNORMAL HIGH (ref 22–32)
Calcium: 8.9 mg/dL (ref 8.9–10.3)
Chloride: 94 mmol/L — ABNORMAL LOW (ref 98–111)
Creatinine, Ser: 1.21 mg/dL — ABNORMAL HIGH (ref 0.44–1.00)
GFR, Estimated: 48 mL/min — ABNORMAL LOW (ref >60)
Glucose, Bld: 93 mg/dL (ref 70–99)
Phosphorus: 3.6 mg/dL (ref 2.5–4.6)
Potassium: 4 mmol/L (ref 3.5–5.1)
Sodium: 137 mmol/L (ref 135–145)

## 2020-07-07 LAB — CBC
HCT: 40.2 % (ref 36.0–46.0)
Hemoglobin: 12.8 g/dL (ref 12.0–15.0)
MCH: 32.5 pg (ref 26.0–34.0)
MCHC: 31.8 g/dL (ref 30.0–36.0)
MCV: 102 fL — ABNORMAL HIGH (ref 80.0–100.0)
Platelets: 225 10*3/uL (ref 150–400)
RBC: 3.94 MIL/uL (ref 3.87–5.11)
RDW: 16.2 % — ABNORMAL HIGH (ref 11.5–15.5)
WBC: 9.6 10*3/uL (ref 4.0–10.5)
nRBC: 0 % (ref 0.0–0.2)

## 2020-07-07 LAB — MAGNESIUM: Magnesium: 1.8 mg/dL (ref 1.7–2.4)

## 2020-07-07 MED ORDER — SPIRIVA HANDIHALER 18 MCG IN CAPS: 18.0000 ug | ORAL_CAPSULE | Freq: Every day | RESPIRATORY_TRACT | 2 refills | Status: DC

## 2020-07-07 MED ORDER — DILTIAZEM HCL ER COATED BEADS 180 MG PO CP24
180.0000 mg | ORAL_CAPSULE | Freq: Every day | ORAL | 1 refills | Status: DC
Start: 2020-07-07 — End: 2021-02-04

## 2020-07-07 MED ORDER — IPRATROPIUM BROMIDE 0.02 % IN SOLN: 0.5000 mg | Freq: Four times a day (QID) | RESPIRATORY_TRACT | Status: DC | PRN

## 2020-07-07 MED ORDER — APIXABAN 5 MG PO TABS
5.0000 mg | ORAL_TABLET | Freq: Two times a day (BID) | ORAL | 1 refills | Status: DC
Start: 2020-07-07 — End: 2020-10-04

## 2020-07-07 MED ORDER — ALBUTEROL SULFATE HFA 108 (90 BASE) MCG/ACT IN AERS: 2.0000 | INHALATION_SPRAY | Freq: Four times a day (QID) | RESPIRATORY_TRACT | 2 refills | Status: DC | PRN

## 2020-07-07 MED ORDER — FUROSEMIDE 40 MG PO TABS
40.0000 mg | ORAL_TABLET | Freq: Every day | ORAL | 1 refills | Status: DC
Start: 2020-07-07 — End: 2021-02-04

## 2020-07-07 MED ORDER — UMECLIDINIUM-VILANTEROL 62.5-25 MCG/INH IN AEPB: 1.0000 | INHALATION_SPRAY | Freq: Every day | RESPIRATORY_TRACT | 1 refills | Status: DC

## 2020-07-07 MED FILL — FUROSEMIDE 40 MG TABLET: 40 | 90 days supply | Qty: 90 | Fill #0

## 2020-07-07 MED FILL — CARTIA XT 180 MG CAPSULE SA: 180 | 90 days supply | Qty: 90 | Fill #0

## 2020-07-07 MED FILL — ELIQUIS 5 MG TABLET: 5 | 30 days supply | Qty: 60 | Fill #0

## 2020-07-07 NOTE — Progress Notes (Signed)
SATURATION QUALIFICATIONS: (This note is used to comply with regulatory documentation for home oxygen)  Patient Saturations on Room Air at Rest = 89%  Patient Saturations on Room Air while Ambulating = 85%  Patient Saturations on 3 Liters of oxygen while Ambulating = 93%  Please briefly explain why patient needs home oxygen: Pt desat while ambulating and at rest.  Pt has hx. Of COPD

## 2020-07-07 NOTE — Progress Notes (Signed)
  ReDS Clip Diuretic Study Pt study # [id number]  Your patient has been enrolled in the ReDS Clip Diuretic Study  Weight on admission 208 lbs, weight is down to 196 lbs today. SCr stable 1.21 (CrCl ~ 50 mL/min). Transitioned to lasix 40 PO BID yesterday. Not on diuretics PTA.  Changes to prescribed diuretics recommended:  No changes - discharge today per MD on lasix 40 mg daily Provider contacted: Dr. Alanda Slim Recommendation was accepted by provider.    REDS Clip  READING= 28% (normal 25-35%)  CHEST RULER = 32 Clip Station = D   Orthodema score = 0 Signs/Symptoms Score   Mild edema, no orthopnea 0 No congestion  Moderate edema, no orthopnea 1 Low-grade orthodema/congestion  Severe edema OR orthopnea 2   Moderate edema and orthopnea 3 High-grade orthodema/congestion  Severe edema AND orthopnea 4    Sharen Hones, PharmD, BCPS Heart Failure Stewardship Pharmacist Phone [phone]  Please check [email or web address] for unit-specific pharmacist phone numbers

## 2020-07-07 NOTE — Discharge Summary (Signed)
Physician Discharge Summary  Pier Laux Block GYF:[id number] DOB: May 28, 1946 DOA: 07/04/2020  PCP: Patient, No Pcp Per  Admit date: 07/04/2020 Discharge date: 07/07/2020  Admitted From: Home Disposition: Home  Recommendations for Outpatient Follow-up:  1. Follow ups as below. 2. Please obtain CBC/BMP/Mag at follow up 3. Please follow up on the following pending results: None  Home Health: PT/OT Equipment/Devices: Rollator and oxygen  Discharge Condition: Stable CODE STATUS: Full code   Follow-up Information    [facility] Follow up.   Why: rollator, oxygen Contact information: [address] [place] [address] [phone]        [facility], [facility] Follow up.   Specialty: Home Health Services Why: HHPT, HHOT Contact information: [address] [place] [address] [phone]        Shon Hale, MD Follow up on 07/11/2020.   Specialty: Family Medicine Why: 10;10 bring photo id and insurance card and wear a mask Contact information: [address] [place] [address] [phone]              Hospital Course: 74 year old female with history of A. fib, tobacco use, COPD not on oxygen and osteoarthritis presenting with shortness of breath, orthopnea leg swelling and admitted for A. fib with RVR and acute on chronic CHF.  Originally from [place].  Came to [facility] to take care of grandchildren due to daughter's untimely death.  She lost her medications with her luggage.  In ED, in A. fib with RVR.  CXR with bilateral opacities concerning for acute cardiogenic pulmonary edema.  Started on Cardizem drip, IV Lasix and IV heparin and admitted.  Echocardiogram without significant finding.  Lower extremity Doppler negative for DVT. Patient was transitioned to p.o. Cardizem and p.o. Lasix and continue to diurese well.   She had net -8.5 L charted.  Weight down from 208 pounds to 196 pounds.  However, she  required 3 L to maintain appropriate saturation with ambulation.  She was discharged with home health and home oxygen.  See individual problem list below for more on hospital course.  Discharge Diagnoses:  Acute hypoxemic respiratory failure likely due to CHF and A. fib:  -Discharged on 3 L by nasal cannula  -Treat CHF, A. fib and COPD as below.  Acute diastolic CHF: Echo with EF of 55 to 60%, mild concentric LVH, severe LAE, severe RAE, RVSP of 37.2 and a small pericardial effusion.  Presented with cardinal symptoms including dyspnea, orthopnea or lower extremity edema.  Elevated BNP to 500.  CXR with mild bilateral pulmonary edema.  Diuresed with IV and p.o. Lasix.  Net -8.5 L.  Weight down from 208 pounds to 96 pounds.  Creatinine slightly up but stable. -Discharged on p.o. Lasix 40 mg daily. -Counseled on sodium and fluid restrictions. -Reassess fluid status and renal function at follow-up.  Paroxysmal A. fib with RVR: Likely provoked by CHF and missed doses of her meds.  -Discharged on p.o. Cardizem CD 180 mg daily and Eliquis 500 mg twice daily -Ambulatory referral to A. fib clinic  Chronic COPD: doubt exacerbation.  Respiratory symptoms likely due to CHF versus COPD. -Discharged on Spiriva and albuterol.  Insurance did not cover [employer].  Elevated troponin: Flat without significant delta.  Likely demand ischemia from CHF and A. fib versus ACS.  No chest pain.  Echocardiogram reassuring. -No further work-up  Hypokalemia/hypomagnesemia: Resolved. -Recheck at follow-up.  Grief-recently lost her daughter -Emotional support.  BLE swelling/Calf tenderness: BLE  Doppler negative for DVT.  Basically resolved. -Manage CHF as above  Debility: Uses cane at baseline. -Home health PT/OT and Rollator  Tobacco use disorder: -Encourage cessation and provided resources.  Class I obesity Body mass index is 30.73 kg/m.            Discharge Exam: Vitals:    07/07/20 0759 07/07/20 1055  BP: (!) 109/59 126/71  Pulse: 96 97  Resp: 18 15  Temp: 97.6 F (36.4 C) 97.7 F (36.5 C)  SpO2: 97% 95%    GENERAL: No apparent distress.  Nontoxic. HEENT: MMM.  Vision and hearing grossly intact.  NECK: Supple.  No apparent JVD.  RESP: 94% on 3 L.  No IWOB.  Fair aeration bilaterally. CVS:  RRR. Heart sounds normal.  ABD/GI/GU: Bowel sounds present. Soft. Non tender.  MSK/EXT:  Moves extremities. No apparent deformity. No edema.  SKIN: no apparent skin lesion or wound NEURO: Awake, alert and oriented appropriately.  No apparent focal neuro deficit. PSYCH: Calm. Normal affect.  Discharge Instructions  Discharge Instructions    (HEART FAILURE PATIENTS) Call MD:  Anytime you have any of the following symptoms: 1) 3 pound weight gain in 24 hours or 5 pounds in 1 week 2) shortness of breath, with or without a dry hacking cough 3) swelling in the hands, feet or stomach 4) if you have to sleep on extra pillows at night in order to breathe.   Complete by: As directed    Amb referral to AFIB Clinic   Complete by: As directed    Call MD for:  difficulty breathing, headache or visual disturbances   Complete by: As directed    Call MD for:  extreme fatigue   Complete by: As directed    Call MD for:  persistant dizziness or light-headedness   Complete by: As directed    Diet - low sodium heart healthy   Complete by: As directed    Discharge instructions   Complete by: As directed    It has been a pleasure taking care of you!  You were hospitalized and treated for atrial fibrillation and heart failure exacerbation.  Your symptoms improved to the point we think it is safe to let you go home and follow-up with your primary care doctor and cardiologist.  We have also sent a referral to our cardiologist if you wish to follow up here.  Please review your new medication list and the directions on your medications before you take them.  Avoid any over-the-counter  pain medications other than plain Tylenol while taking Eliquis.  In regards to your heart failure,  it is important that you take your medications as prescribed, avoid alcohol or over-the-counter pain medication other than plain Tylenol, limit the amount of water/fluid you drink to less than 6 cups (1500 cc) a day,  limit your sodium (salt) intake to less than 2 g (2000 mg) a day and weigh yourself daily at the same time and keeping your weight log.   It is important that you quit smoking cigarettes.  You may use nicotine patch to help you quit smoking.  Nicotine patch is available over-the-counter.  You may also discuss other options to help you quit smoking with your primary care doctor. You can also talk to professional counselors at 1-800-QUIT-NOW (918)816-3042) for free smoking cessation counseling.    Take care,   Increase activity slowly   Complete by: As directed      Allergies as of 07/07/2020  Reactions   Penicillins    Did it involve swelling of the face/tongue/throat, SOB, or low BP? Yes Did it involve sudden or severe rash/hives, skin peeling, or any reaction on the inside of your mouth or nose? N Did you need to seek medical attention at a hospital or doctor's office? Y When did it last happen?childhood If all above answers are "NO", may proceed with cephalosporin use.      Medication List    STOP taking these medications   naproxen sodium 220 MG tablet Commonly known as: ALEVE     TAKE these medications   acetaminophen 500 MG tablet Commonly known as: TYLENOL Take 500 mg by mouth every 6 (six) hours as needed for moderate pain.   albuterol 108 (90 Base) MCG/ACT inhaler Commonly known as: VENTOLIN HFA Inhale 2 puffs into the lungs every 6 (six) hours as needed for wheezing or shortness of breath.   apixaban 5 MG Tabs tablet Commonly known as: ELIQUIS Take 1 tablet (5 mg total) by mouth 2 (two) times daily.   diltiazem 180 MG 24 hr capsule Commonly  known as: CARDIZEM CD Take 1 capsule (180 mg total) by mouth daily.   furosemide 40 MG tablet Commonly known as: LASIX Take 1 tablet (40 mg total) by mouth daily.   Spiriva HandiHaler 18 MCG inhalation capsule Generic drug: tiotropium Place 1 capsule (18 mcg total) into inhaler and inhale daily.            Durable Medical Equipment  (From admission, onward)         Start     Ordered   07/07/20 0950  For home use only DME oxygen  Once       Question Answer Comment  Length of Need 6 Months   Mode or (Route) Nasal cannula   Liters per Minute 3   Frequency Continuous (stationary and portable oxygen unit needed)   Oxygen delivery system Gas      07/07/20 0950   07/07/20 0901  For home use only DME 4 wheeled rolling walker with seat  Once       Question:  Patient needs a walker to treat with the following condition  Answer:  Weakness   07/07/20 0901          Consultations:  None  Procedures/Studies:  2D Echo on 07/05/2020 1. Left ventricular ejection fraction, by estimation, is 55 to 60%. The  left ventricle has normal function. The left ventricle has no regional  wall motion abnormalities. There is mild concentric left ventricular  hypertrophy. Left ventricular diastolic  function could not be evaluated.  2. Right ventricular systolic function is mildly reduced. The right  ventricular size is mildly enlarged. There is mildly elevated pulmonary  artery systolic pressure. The estimated right ventricular systolic  pressure is 37.2 mmHg.  3. Left atrial size was severely dilated.  4. Right atrial size was severely dilated.  5. A small pericardial effusion is present.  6. The mitral valve is normal in structure. Trivial mitral valve  regurgitation. No evidence of mitral stenosis.  7. The aortic valve is tricuspid. There is mild calcification of the  aortic valve. Aortic valve regurgitation is not visualized. Mild aortic  valve sclerosis is present, with no  evidence of aortic valve stenosis.  8. The inferior vena cava is dilated in size with >50% respiratory  variability, suggesting right atrial pressure of 8 mmHg.    CT ANGIO CHEST PE W OR WO CONTRAST  Result Date:  07/04/2020 CLINICAL DATA:  74 year old female with concern for pulmonary embolism. EXAM: CT ANGIOGRAPHY CHEST WITH CONTRAST TECHNIQUE: Multidetector CT imaging of the chest was performed using the standard protocol during bolus administration of intravenous contrast. Multiplanar CT image reconstructions and MIPs were obtained to evaluate the vascular anatomy. CONTRAST:  OMNIPAQUE IOHEXOL 350 MG/ML SOLN COMPARISON:  Chest radiograph dated 07/04/2020. FINDINGS: Cardiovascular: There is moderate cardiomegaly. There is dilatation of the right heart chamber with retrograde flow of contrast from the right atrium into the IVC consistent with a degree of right heart dysfunction. No pericardial effusion. 3 vessel coronary vascular calcification. There is moderate atherosclerotic calcification of the thoracic aorta. No pulmonary artery embolus identified. Mediastinum/Nodes: Mild hilar and mediastinal adenopathies measure 14 mm in short axis in the right hilum and subcarinal region, likely reactive. The esophagus is grossly unremarkable. No mediastinal fluid collection. Lungs/Pleura: Small bilateral pleural effusions. There is background of centrilobular and paraseptal emphysema. Areas of consolidation at the left lung base and lingula may represent atelectasis or pneumonia. Clinical correlation and follow-up to resolution recommended. There is a 4 mm right lower lobe nodule (104/11). A 6 mm left lower lobe nodule is noted (91/11). Mild interstitial prominence may represent mild edema. No pneumothorax. The central airways are patent. Upper Abdomen: Partially visualized 3 cm left adrenal hypodense nodule, indeterminate, likely an adenoma. Mild irregularity of the liver contour, likely early changes of  cirrhosis. Partially visualized 3 cm hypodense lesion along the medial aspect of the right lobe of the liver noted which is not evaluated. CT of the abdomen pelvis may provide better evaluation on a nonemergent/outpatient basis if clinically indicated. Musculoskeletal: Degenerative changes of the spine. No acute osseous pathology. Review of the MIP images confirms the above findings. IMPRESSION: 1. No CT evidence of pulmonary artery embolus. 2. Moderate cardiomegaly with evidence of right heart dysfunction. 3. Small bilateral pleural effusions with probable mild interstitial edema. 4. Areas of consolidation at the left lung base and lingula may represent atelectasis or pneumonia. Clinical correlation and follow-up to resolution recommended. 5. Aortic Atherosclerosis (ICD10-I70.0) and Emphysema (ICD10-J43.9). Electronically Signed   By: Elgie Collard M.D.   On: 07/04/2020 23:21   DG Chest Portable 1 View  Result Date: 07/04/2020 CLINICAL DATA:  AFib EXAM: PORTABLE CHEST 1 VIEW COMPARISON:  None. FINDINGS: The heart size and mediastinal contours are large with mild cardiomegaly. There is diffusely increased interstitial markings throughout both lungs. A small left and trace right pleural effusion are present. No acute osseous abnormality. IMPRESSION: Cardiomegaly and interstitial edema. Small left and trace right pleural effusion. Electronically Signed   By: Jonna Clark M.D.   On: 07/04/2020 19:30   ECHOCARDIOGRAM COMPLETE  Result Date: 07/05/2020    ECHOCARDIOGRAM REPORT   Patient Name:   [facility] BLOCK Date of Exam: 07/05/2020 Medical Rec #:  [id number]          Height:       67.0 in Accession #:    [id number]         Weight:       207.2 lb Date of Birth:  01/30/1946           BSA:          2.053 m Patient Age:    74 years           BP:           148/107 mmHg Patient Gender: F  HR:           100 bpm. Exam Location:  Inpatient Procedure: 2D Echo, Cardiac Doppler and Color Doppler  Indications:    Atrial fibrillation and Flutter; I50.40* Unspecified combined                 systolic (congestive) and diastolic (congestive) heart failure  History:        Patient has no prior history of Echocardiogram examinations.                 CHF, Previous Myocardial Infarction, Abnormal ECG, COPD,                 Arrythmias:Atrial Fibrillation, Signs/Symptoms:Shortness of                 Breath and Dyspnea; Risk Factors:Current Smoker. Pulmonary                 edema.  Sonographer:    Sheralyn Boatman RDCS Referring Phys: [id number] Deno Lunger [facility] IMPRESSIONS  1. Left ventricular ejection fraction, by estimation, is 55 to 60%. The left ventricle has normal function. The left ventricle has no regional wall motion abnormalities. There is mild concentric left ventricular hypertrophy. Left ventricular diastolic function could not be evaluated.  2. Right ventricular systolic function is mildly reduced. The right ventricular size is mildly enlarged. There is mildly elevated pulmonary artery systolic pressure. The estimated right ventricular systolic pressure is 37.2 mmHg.  3. Left atrial size was severely dilated.  4. Right atrial size was severely dilated.  5. A small pericardial effusion is present.  6. The mitral valve is normal in structure. Trivial mitral valve regurgitation. No evidence of mitral stenosis.  7. The aortic valve is tricuspid. There is mild calcification of the aortic valve. Aortic valve regurgitation is not visualized. Mild aortic valve sclerosis is present, with no evidence of aortic valve stenosis.  8. The inferior vena cava is dilated in size with >50% respiratory variability, suggesting right atrial pressure of 8 mmHg. Comparison(s): No prior Echocardiogram. Conclusion(s)/Recommendation(s): Normal LVEF with severe biatrial enlargement, mildly elevated RVSP/mildly decreased RV function, no significant valve disease. FINDINGS  Left Ventricle: Left ventricular ejection fraction, by estimation, is  55 to 60%. The left ventricle has normal function. The left ventricle has no regional wall motion abnormalities. The left ventricular internal cavity size was normal in size. There is  mild concentric left ventricular hypertrophy. Left ventricular diastolic function could not be evaluated due to atrial fibrillation. Left ventricular diastolic function could not be evaluated. Right Ventricle: The right ventricular size is mildly enlarged. Right vetricular wall thickness was not well visualized. Right ventricular systolic function is mildly reduced. There is mildly elevated pulmonary artery systolic pressure. The tricuspid regurgitant velocity is 2.70 m/s, and with an assumed right atrial pressure of 8 mmHg, the estimated right ventricular systolic pressure is 37.2 mmHg. Left Atrium: Left atrial size was severely dilated. Right Atrium: Right atrial size was severely dilated. Pericardium: A small pericardial effusion is present. Presence of pericardial fat pad. Mitral Valve: The mitral valve is normal in structure. Trivial mitral valve regurgitation. No evidence of mitral valve stenosis. Tricuspid Valve: The tricuspid valve is normal in structure. Tricuspid valve regurgitation is mild . No evidence of tricuspid stenosis. Aortic Valve: The aortic valve is tricuspid. There is mild calcification of the aortic valve. Aortic valve regurgitation is not visualized. Mild aortic valve sclerosis is present, with no evidence of aortic valve stenosis. Pulmonic Valve: The pulmonic valve  was not well visualized. Pulmonic valve regurgitation is not visualized. No evidence of pulmonic stenosis. Aorta: The aortic root, ascending aorta and aortic arch are all structurally normal, with no evidence of dilitation or obstruction. Venous: The inferior vena cava is dilated in size with greater than 50% respiratory variability, suggesting right atrial pressure of 8 mmHg. IAS/Shunts: No atrial level shunt detected by color flow Doppler.  LEFT  VENTRICLE PLAX 2D LVIDd:         5.20 cm LVIDs:         4.00 cm LV PW:         1.60 cm LV IVS:        1.30 cm LVOT diam:     2.00 cm LV SV:         52 LV SV Index:   25 LVOT Area:     3.14 cm  LV Volumes (MOD) LV vol d, MOD A2C: 89.5 ml LV vol d, MOD A4C: 70.2 ml LV vol s, MOD A2C: 36.2 ml LV vol s, MOD A4C: 32.2 ml LV SV MOD A2C:     53.3 ml LV SV MOD A4C:     70.2 ml LV SV MOD BP:      47.5 ml RIGHT VENTRICLE            IVC RV S prime:     9.25 cm/s  IVC diam: 3.20 cm TAPSE (M-mode): 1.5 cm LEFT ATRIUM              Index       RIGHT ATRIUM           Index LA diam:        4.10 cm  2.00 cm/m  RA Area:     28.00 cm LA Vol (A2C):   178.0 ml 86.70 ml/m RA Volume:   86.90 ml  42.33 ml/m LA Vol (A4C):   109.0 ml 53.09 ml/m LA Biplane Vol: 138.0 ml 67.22 ml/m  AORTIC VALVE LVOT Vmax:   91.40 cm/s LVOT Vmean:  68.600 cm/s LVOT VTI:    0.165 m  AORTA Ao Root diam: 3.40 cm MITRAL VALVE                TRICUSPID VALVE MV Area (PHT): 4.65 cm     TR Peak grad:   29.2 mmHg MV Decel Time: 163 msec     TR Vmax:        270.00 cm/s MV E velocity: 123.33 cm/s                             SHUNTS                             Systemic VTI:  0.16 m                             Systemic Diam: 2.00 cm Jodelle Red MD Electronically signed by Jodelle Red MD Signature Date/Time: 07/05/2020/12:59:37 PM    Final    VAS [place] LOWER EXTREMITY VENOUS (DVT)  Result Date: 07/05/2020  Lower Venous DVT Study Indications: Swelling.  Risk Factors: None identified. Limitations: Poor ultrasound/tissue interface. Comparison Study: No prior studies. Performing Technologist: Chanda Busing RVT  Examination Guidelines: A complete evaluation includes B-mode imaging, spectral Doppler, color Doppler, and power Doppler as needed of all accessible portions of each  vessel. Bilateral testing is considered an integral part of a complete examination. Limited examinations for reoccurring indications may be performed as noted. The reflux  portion of the exam is performed with the patient in reverse Trendelenburg.  +---------+---------------+---------+-----------+----------+--------------+ RIGHT    CompressibilityPhasicitySpontaneityPropertiesThrombus Aging +---------+---------------+---------+-----------+----------+--------------+ CFV      Full           Yes      Yes                                 +---------+---------------+---------+-----------+----------+--------------+ SFJ      Full                                                        +---------+---------------+---------+-----------+----------+--------------+ FV Prox  Full                                                        +---------+---------------+---------+-----------+----------+--------------+ FV Mid   Full                                                        +---------+---------------+---------+-----------+----------+--------------+ FV DistalFull                                                        +---------+---------------+---------+-----------+----------+--------------+ PFV      Full                                                        +---------+---------------+---------+-----------+----------+--------------+ POP      Full           Yes      Yes                                 +---------+---------------+---------+-----------+----------+--------------+ PTV      Full                                                        +---------+---------------+---------+-----------+----------+--------------+ PERO     Full                                                        +---------+---------------+---------+-----------+----------+--------------+   +---------+---------------+---------+-----------+----------+--------------+ LEFT     CompressibilityPhasicitySpontaneityPropertiesThrombus Aging +---------+---------------+---------+-----------+----------+--------------+  CFV      Full           Yes      Yes                                  +---------+---------------+---------+-----------+----------+--------------+ SFJ      Full                                                        +---------+---------------+---------+-----------+----------+--------------+ FV Prox  Full                                                        +---------+---------------+---------+-----------+----------+--------------+ FV Mid   Full                                                        +---------+---------------+---------+-----------+----------+--------------+ FV DistalFull                                                        +---------+---------------+---------+-----------+----------+--------------+ PFV      Full                                                        +---------+---------------+---------+-----------+----------+--------------+ POP      Full           Yes      Yes                                 +---------+---------------+---------+-----------+----------+--------------+ PTV      Full                                                        +---------+---------------+---------+-----------+----------+--------------+ PERO     Full                                                        +---------+---------------+---------+-----------+----------+--------------+     Summary: RIGHT: - There is no evidence of deep vein thrombosis in the lower extremity. However, portions of this examination were limited- see technologist comments above.  - No cystic structure found in the popliteal fossa.  LEFT: - There is no evidence of deep vein thrombosis in the lower extremity.  However, portions of this examination were limited- see technologist comments above.  - No cystic structure found in the popliteal fossa.  *See table(s) above for measurements and observations. Electronically signed by Coral Else MD on 07/05/2020 at 8:52:43 PM.    Final        The results of significant  diagnostics from this hospitalization (including imaging, microbiology, ancillary and laboratory) are listed below for reference.     Microbiology: Recent Results (from the past 240 hour(s))  Resp Panel by RT PCR (RSV, Flu A&B, Covid) - Nasopharyngeal Swab     Status: None   Collection Time: 07/04/20  7:35 PM   Specimen: Nasopharyngeal Swab  Result Value Ref Range Status   SARS Coronavirus 2 by RT PCR NEGATIVE NEGATIVE Final    Comment: (NOTE) SARS-CoV-2 target nucleic acids are NOT DETECTED.  The SARS-CoV-2 RNA is generally detectable in upper respiratoy specimens during the acute phase of infection. The lowest concentration of SARS-CoV-2 viral copies this assay can detect is 131 copies/mL. A negative result does not preclude SARS-Cov-2 infection and should not be used as the sole basis for treatment or other patient management decisions. A negative result may occur with  improper specimen collection/handling, submission of specimen other than nasopharyngeal swab, presence of viral mutation(s) within the areas targeted by this assay, and inadequate number of viral copies (<131 copies/mL). A negative result must be combined with clinical observations, patient history, and epidemiological information. The expected result is Negative.  Fact Sheet for Patients:  https://www.moore.com/  Fact Sheet for Healthcare Providers:  https://www.young.biz/  This test is no t yet approved or cleared by the [place] FDA and  has been authorized for detection and/or diagnosis of SARS-CoV-2 by FDA under an Emergency Use Authorization (EUA). This EUA will remain  in effect (meaning this test can be used) for the duration of the COVID-19 declaration under Section 564(b)(1) of the Act, 21 U.S.C. section 360bbb-3(b)(1), unless the authorization is terminated or revoked sooner.     Influenza A by PCR NEGATIVE NEGATIVE Final   Influenza B by PCR NEGATIVE  NEGATIVE Final    Comment: (NOTE) The Xpert Xpress SARS-CoV-2/FLU/RSV assay is intended as an aid in  the diagnosis of influenza from Nasopharyngeal swab specimens and  should not be used as a sole basis for treatment. Nasal washings and  aspirates are unacceptable for Xpert Xpress SARS-CoV-2/FLU/RSV  testing.  Fact Sheet for Patients: https://www.moore.com/  Fact Sheet for Healthcare Providers: https://www.young.biz/  This test is not yet approved or cleared by the [place] FDA and  has been authorized for detection and/or diagnosis of SARS-CoV-2 by  FDA under an Emergency Use Authorization (EUA). This EUA will remain  in effect (meaning this test can be used) for the duration of the  Covid-19 declaration under Section 564(b)(1) of the Act, 21  U.S.C. section 360bbb-3(b)(1), unless the authorization is  terminated or revoked.    Respiratory Syncytial Virus by PCR NEGATIVE NEGATIVE Final    Comment: (NOTE) Fact Sheet for Patients: https://www.moore.com/  Fact Sheet for Healthcare Providers: https://www.young.biz/  This test is not yet approved or cleared by the [place] FDA and  has been authorized for detection and/or diagnosis of SARS-CoV-2 by  FDA under an Emergency Use Authorization (EUA). This EUA will remain  in effect (meaning this test can be used) for the duration of the  COVID-19 declaration under Section 564(b)(1) of the Act, 21 U.S.C.  section 360bbb-3(b)(1), unless the authorization is terminated or  revoked. Performed at [facility], [address] [place], [place] [address]   MRSA PCR Screening     Status: None   Collection Time: 07/04/20 11:50 PM   Specimen: Nasal Mucosa; Nasopharyngeal  Result Value Ref Range Status   MRSA by PCR NEGATIVE NEGATIVE Final    Comment:        The GeneXpert MRSA Assay (FDA approved for NASAL specimens only), is one component of  a comprehensive MRSA colonization surveillance program. It is not intended to diagnose MRSA infection nor to guide or monitor treatment for MRSA infections. Performed at [facility] [place] [facility], [address] [place], [place] [address]      Labs: BNP (last 3 results) Recent Labs    07/04/20 1904  BNP 503.9*   Basic Metabolic Panel: Recent Labs  Lab 07/04/20 1904 07/05/20 0023 07/06/20 0023 07/07/20 0028  NA 144 141 136 137  K 3.6 3.2* 4.3 4.0  CL 110 103 96* 94*  CO2 33*  GLUCOSE 102* 117* 145* 93  BUN CREATININE 1.03* 1.08* 1.21* 1.21*  CALCIUM 9.0 8.7* 9.1 8.9  MG 1.6* 2.3 1.8 1.8  PHOS  --   --  2.7 3.6   Liver Function Tests: Recent Labs  Lab 07/05/20 0023 07/06/20 0023 07/07/20 0028  AST 23  --   --   ALT 22  --   --   ALKPHOS 71  --   --   BILITOT 0.8  --   --   PROT 6.4*  --   --   ALBUMIN 3.3* 3.3* 3.3*   No results for input(s): LIPASE, AMYLASE in the last 168 hours. No results for input(s): AMMONIA in the last 168 hours. CBC: Recent Labs  Lab 07/04/20 1904 07/05/20 0023 07/06/20 0023 07/07/20 0028  WBC 6.8 7.1 10.9* 9.6  NEUTROABS 4.6 4.7  --   --   HGB 13.3 12.9 12.9 12.8  HCT 41.3 40.6 40.0 40.2  MCV 102.7* 102.0* 102.0* 102.0*  PLT 197 192 210 225   Cardiac Enzymes: No results for input(s): CKTOTAL, CKMB, CKMBINDEX, TROPONINI in the last 168 hours. BNP: Invalid input(s): POCBNP CBG: No results for input(s): GLUCAP in the last 168 hours. D-Dimer Recent Labs    07/04/20 2142  DDIMER 2.27*   Hgb A1c No results for input(s): HGBA1C in the last 72 hours. Lipid Profile No results for input(s): CHOL, HDL, LDLCALC, TRIG, CHOLHDL, LDLDIRECT in the last 72 hours. Thyroid function studies Recent Labs    07/05/20 0023  TSH 3.396   Anemia work up No results for input(s): VITAMINB12, FOLATE, FERRITIN, TIBC, IRON, RETICCTPCT in the last 72 hours. Urinalysis No results found for: COLORURINE, APPEARANCEUR,  LABSPEC, PHURINE, GLUCOSEU, HGBUR, BILIRUBINUR, KETONESUR, PROTEINUR, UROBILINOGEN, NITRITE, LEUKOCYTESUR Sepsis Labs Invalid input(s): PROCALCITONIN,  WBC,  LACTICIDVEN   Time coordinating discharge: 40 minutes  SIGNED:  Almon Hercules, MD  [facility] 07/07/2020, 5:56 PM  If 7PM-7AM, please contact night-coverage [email or web address]

## 2020-07-07 NOTE — TOC Transition Note (Addendum)
Transition of Care [facility]) - CM/SW Discharge Note   Patient Details  Name: Sherri Hart MRN: [id number] Date of Birth: [birth year]  Transition of Care [facility]) CM/SW Contact:  Leone Haven, RN Phone Number: 07/07/2020, 9:04 AM   Clinical Narrative:    NCM spoke with patient at bedside, offere choice, she has no preference, NCM made referral to [facility] with [facility] for HHPT, HHOT, he is able to take referral.  Soc will begin 24 to 48 hrs.  Patient will also need a rollator, NCM made referral to [facility] with [employer]. This will be brought to her room prior to dc.  Patient states she will be taking care of two of her grandkids and her daughter Clydene Pugh will be staying with her as well.  She has transportation at discharge.  NCM informed her of her co pay for refills for eliquis 47.00, she states ok.  TOC will be bringing meds to patient room before she is discharged.  Per Rosalita Chessman Staff RN, checking to see if she may need home oxygen. Per the physical therapist she will need home oxygen, NCM made referral to [facility] with [employer] for the home oxygen.    Final next level of care: Home w Home Health Services Barriers to Discharge: No Barriers Identified   Patient Goals and CMS Choice Patient states their goals for this hospitalization and ongoing recovery are:: get better CMS Medicare.gov Compare Post Acute Care list provided to:: Patient Choice offered to / list presented to : Patient  Discharge Placement                       Discharge Plan and Services                DME Arranged: Walker rolling with seat DME Agency: [employer] Date DME Agency Contacted: 07/07/20 Time DME Agency Contacted: [phone] Representative spoke with at DME Agency: shelia HH Arranged: PT, OT HH Agency: [facility] Date [facility] Agency Contacted: 07/07/20 Time HH Agency Contacted: [phone] Representative spoke with at [facility] Agency: Kandee Keen  Social Determinants of Health (SDOH) Interventions Transportation  Interventions: Intervention Not Indicated   Readmission Risk Interventions No flowsheet data found.

## 2020-07-07 NOTE — Plan of Care (Signed)

## 2020-07-07 NOTE — Progress Notes (Signed)
Occupational Therapy Treatment Patient Details Name: Sherri Hart MRN: [id number] DOB: [birth year] Today's Date: 07/07/2020    History of present illness 74 year old female with history of A. fib, tobacco use, COPD not on oxygen and osteoarthritis presenting with shortness of breath, orthopnea leg swelling and admitted for A. fib with RVR and acute on chronic CHF. In ED, in A. fib with RVR.  CXR with bilateral opacities concerning for acute cardiogenic pulmonary edema.Admitted 07/04/20 for treatment of acute hypoxemic respiratory failure likely due to CHF and A fib   OT comments  Pt performed toileting, standing grooming and ambulation with RW with supervision for safety. Educated in pursed lip breathing and energy conservation techniques and provided written handout to reinforce.  Pt with Sp02 of 93-95% at rest on 3L, 89-90% on RA, dropped to 85% with activity. Left pt in chair on 3L 02.   Follow Up Recommendations  Home health OT;Supervision - Intermittent    Equipment Recommendations  Tub/shower bench (pt unable to afford items not covered by her insurance)    Recommendations for Other Services      Precautions / Restrictions Precautions Precautions: Fall Precaution Comments: watch 02       Mobility Bed Mobility Overal bed mobility: Modified Independent                Transfers   Equipment used: Rolling walker (2 wheeled) Transfers: Sit to/from Stand Sit to Stand: Supervision         General transfer comment: cues for hand placement     Balance Overall balance assessment: Needs assistance   Sitting balance-Leahy Scale: Good       Standing balance-Leahy Scale: Fair                             ADL either performed or assessed with clinical judgement   ADL Overall ADL's : Needs assistance/impaired     Grooming: Supervision/safety;Standing           Upper Body Dressing : Set up;Sitting       Toilet Transfer:  Supervision/safety;Ambulation;RW;Regular Toilet   Toileting- Clothing Manipulation and Hygiene: Modified independent;Sit to/from stand       Functional mobility during ADLs: Supervision/safety;Rolling walker General ADL Comments: instructed in pursed lip breathing, energy conservation and benefits of seated showering     Vision       Perception     Praxis      Cognition Arousal/Alertness: Awake/alert Behavior During Therapy: WFL for tasks assessed/performed Overall Cognitive Status: Within Functional Limits for tasks assessed                                          Exercises     Shoulder Instructions       General Comments      Pertinent Vitals/ Pain       Pain Assessment: Faces Faces Pain Scale: Hurts little more Pain Location: feet/legs Pain Descriptors / Indicators: Sore Pain Intervention(s): Monitored during session  Home Living                                          Prior Functioning/Environment              Frequency  Progress Toward Goals  OT Goals(current goals can now be found in the care plan section)  Progress towards OT goals: Progressing toward goals  Acute Rehab OT Goals Patient Stated Goal: get better to take care of great grandchildren OT Goal Formulation: With patient Time For Goal Achievement: 07/20/20 Potential to Achieve Goals: Good  Plan Discharge plan remains appropriate    Co-evaluation                 AM-PAC OT "6 Clicks" Daily Activity     Outcome Measure   Help from another person eating meals?: None Help from another person taking care of personal grooming?: A Little Help from another person toileting, which includes using toliet, bedpan, or urinal?: A Little Help from another person bathing (including washing, rinsing, drying)?: A Little Help from another person to put on and taking off regular upper body clothing?: None Help from another person to put on  and taking off regular lower body clothing?: A Little 6 Click Score: 20    End of Session Equipment Utilized During Treatment: Rolling walker;Gait belt;Oxygen  OT Visit Diagnosis: Pain;Other abnormalities of gait and mobility (R26.89);Other (comment) (decreased activity tolerance)   Activity Tolerance Patient tolerated treatment well   Patient Left in bed;with call bell/phone within reach   Nurse Communication Other (comment) (02 status)        Time: 2951-8841 OT Time Calculation (min): 31 min  Charges: OT General Charges $OT Visit: 1 Visit OT Treatments $Self Care/Home Management : 23-37 mins  Martie Round, OTR/L Acute Rehabilitation Services Pager: [phone] Office: [phone]   Evern Bio 07/07/2020, 9:25 AM

## 2020-07-07 NOTE — Progress Notes (Signed)
Pt's taken to front of hospital via w/c to meet granddaughter via w/c with DME equipment and Personal property.

## 2020-07-07 NOTE — Plan of Care (Signed)
  Problem: Education: Goal: Knowledge of General Education information will improve Description: Including pain rating scale, medication(s)/side effects and non-pharmacologic comfort measures 07/07/2020 1402 by Luna Kitchens, RN Outcome: Adequate for Discharge 07/07/2020 0730 by Luna Kitchens, RN Outcome: Progressing   Problem: Health Behavior/Discharge Planning: Goal: Ability to manage health-related needs will improve 07/07/2020 1402 by Luna Kitchens, RN Outcome: Adequate for Discharge 07/07/2020 0730 by Luna Kitchens, RN Outcome: Progressing   Problem: Clinical Measurements: Goal: Ability to maintain clinical measurements within normal limits will improve 07/07/2020 1402 by Luna Kitchens, RN Outcome: Adequate for Discharge 07/07/2020 0730 by Luna Kitchens, RN Outcome: Progressing Goal: Will remain free from infection 07/07/2020 1402 by Luna Kitchens, RN Outcome: Adequate for Discharge 07/07/2020 0730 by Luna Kitchens, RN Outcome: Progressing Goal: Diagnostic test results will improve 07/07/2020 1402 by Luna Kitchens, RN Outcome: Adequate for Discharge 07/07/2020 0730 by Luna Kitchens, RN Outcome: Progressing Goal: Respiratory complications will improve 07/07/2020 1402 by Luna Kitchens, RN Outcome: Adequate for Discharge 07/07/2020 0730 by Luna Kitchens, RN Outcome: Progressing Goal: Cardiovascular complication will be avoided 07/07/2020 1402 by Luna Kitchens, RN Outcome: Adequate for Discharge 07/07/2020 0730 by Luna Kitchens, RN Outcome: Progressing   Problem: Activity: Goal: Risk for activity intolerance will decrease 07/07/2020 1402 by Luna Kitchens, RN Outcome: Adequate for Discharge 07/07/2020 0730 by Luna Kitchens, RN Outcome: Progressing   Problem: Nutrition: Goal: Adequate nutrition will be maintained 07/07/2020 1402 by Luna Kitchens, RN Outcome: Adequate for Discharge 07/07/2020 0730 by Luna Kitchens, RN Outcome: Progressing   Problem:  Coping: Goal: Level of anxiety will decrease 07/07/2020 1402 by Luna Kitchens, RN Outcome: Adequate for Discharge 07/07/2020 0730 by Luna Kitchens, RN Outcome: Progressing   Problem: Elimination: Goal: Will not experience complications related to bowel motility 07/07/2020 1402 by Luna Kitchens, RN Outcome: Adequate for Discharge 07/07/2020 0730 by Luna Kitchens, RN Outcome: Progressing Goal: Will not experience complications related to urinary retention 07/07/2020 1402 by Luna Kitchens, RN Outcome: Adequate for Discharge 07/07/2020 0730 by Luna Kitchens, RN Outcome: Progressing   Problem: Pain Managment: Goal: General experience of comfort will improve 07/07/2020 1402 by Luna Kitchens, RN Outcome: Adequate for Discharge 07/07/2020 0730 by Luna Kitchens, RN Outcome: Progressing   Problem: Safety: Goal: Ability to remain free from injury will improve 07/07/2020 1402 by Luna Kitchens, RN Outcome: Adequate for Discharge 07/07/2020 0730 by Luna Kitchens, RN Outcome: Progressing   Problem: Skin Integrity: Goal: Risk for impaired skin integrity will decrease 07/07/2020 1402 by Luna Kitchens, RN Outcome: Adequate for Discharge 07/07/2020 0730 by Luna Kitchens, RN Outcome: Progressing   Problem: Education: Goal: Ability to demonstrate management of disease process will improve 07/07/2020 1402 by Luna Kitchens, RN Outcome: Adequate for Discharge 07/07/2020 0730 by Luna Kitchens, RN Outcome: Progressing Goal: Ability to verbalize understanding of medication therapies will improve 07/07/2020 1402 by Luna Kitchens, RN Outcome: Adequate for Discharge 07/07/2020 0730 by Luna Kitchens, RN Outcome: Progressing Goal: Individualized Educational Video(s) 07/07/2020 1402 by Luna Kitchens, RN Outcome: Adequate for Discharge 07/07/2020 0730 by Luna Kitchens, RN Outcome: Progressing   Problem: Activity: Goal: Capacity to carry out activities will improve 07/07/2020 1402 by Luna Kitchens, RN Outcome: Adequate for Discharge 07/07/2020 0730 by Luna Kitchens, RN Outcome: Progressing   Problem: Cardiac: Goal: Ability to achieve and maintain adequate cardiopulmonary perfusion will improve 07/07/2020 1402 by Luna Kitchens, RN Outcome: Adequate for Discharge 07/07/2020 0730 by Luna Kitchens, RN Outcome: Progressing

## 2020-07-14 ENCOUNTER — Inpatient Hospital Stay (HOSPITAL_COMMUNITY): Admission: RE | Admit: 2020-07-14 | Payer: Medicare HMO | Source: Ambulatory Visit | Admitting: Nurse Practitioner

## 2020-07-25 ENCOUNTER — Telehealth (HOSPITAL_COMMUNITY): Payer: Self-pay

## 2020-07-25 NOTE — Telephone Encounter (Signed)
Patient Advocate Encounter  Contacted [employer] after submitting an application for Eliquis on 07/13/20.   Application has been denied and requires additional information prior to approval. They will need the pharmacy out-of-pocket statement stating that she has paid at least $536.86 in prescription copays for the year 2021.   Called patient to request all pharmacy fill history so we can see if she can meet this minimum. First call was answered but it was ended immediately. Attempted a second call and was able to leave a voicemail.  Will continue to follow.  Sharen Hones, PharmD, BCPS Heart Failure Stewardship Pharmacist Phone [phone]

## 2020-10-03 ENCOUNTER — Emergency Department (HOSPITAL_COMMUNITY): Payer: Medicare PPO

## 2020-10-03 ENCOUNTER — Encounter (HOSPITAL_COMMUNITY): Payer: Self-pay | Admitting: Emergency Medicine

## 2020-10-03 ENCOUNTER — Emergency Department (HOSPITAL_COMMUNITY)
Admission: EM | Admit: 2020-10-03 | Discharge: 2020-10-04 | Disposition: A | Discharge status: Home / Self Care | Payer: Medicare PPO | Attending: Emergency Medicine | Admitting: Emergency Medicine

## 2020-10-03 ENCOUNTER — Other Ambulatory Visit: Payer: Self-pay

## 2020-10-03 DIAGNOSIS — I5031 Acute diastolic (congestive) heart failure: Secondary | ICD-10-CM | POA: Diagnosis not present

## 2020-10-03 DIAGNOSIS — Z7951 Long term (current) use of inhaled steroids: Secondary | ICD-10-CM | POA: Diagnosis not present

## 2020-10-03 DIAGNOSIS — R072 Precordial pain: Secondary | ICD-10-CM | POA: Insufficient documentation

## 2020-10-03 DIAGNOSIS — R0602 Shortness of breath: Secondary | ICD-10-CM | POA: Diagnosis present

## 2020-10-03 DIAGNOSIS — J441 Chronic obstructive pulmonary disease with (acute) exacerbation: Secondary | ICD-10-CM | POA: Insufficient documentation

## 2020-10-03 DIAGNOSIS — J189 Pneumonia, unspecified organism: Secondary | ICD-10-CM

## 2020-10-03 DIAGNOSIS — Z7901 Long term (current) use of anticoagulants: Secondary | ICD-10-CM | POA: Insufficient documentation

## 2020-10-03 DIAGNOSIS — Z20822 Contact with and (suspected) exposure to covid-19: Secondary | ICD-10-CM | POA: Insufficient documentation

## 2020-10-03 LAB — BASIC METABOLIC PANEL
Anion gap: 11 (ref 5–15)
BUN: 18 mg/dL (ref 8–23)
CO2: 26 mmol/L (ref 22–32)
Calcium: 9.3 mg/dL (ref 8.9–10.3)
Chloride: 102 mmol/L (ref 98–111)
Creatinine, Ser: 1.13 mg/dL — ABNORMAL HIGH (ref 0.44–1.00)
GFR, Estimated: 52 mL/min — ABNORMAL LOW (ref >60)
Glucose, Bld: 95 mg/dL (ref 70–99)
Potassium: 4.5 mmol/L (ref 3.5–5.1)
Sodium: 139 mmol/L (ref 135–145)

## 2020-10-03 LAB — CBC
HCT: 47.8 % — ABNORMAL HIGH (ref 36.0–46.0)
Hemoglobin: 15.6 g/dL — ABNORMAL HIGH (ref 12.0–15.0)
MCH: 33.3 pg (ref 26.0–34.0)
MCHC: 32.6 g/dL (ref 30.0–36.0)
MCV: 101.9 fL — ABNORMAL HIGH (ref 80.0–100.0)
Platelets: 190 10*3/uL (ref 150–400)
RBC: 4.69 MIL/uL (ref 3.87–5.11)
RDW: 14.3 % (ref 11.5–15.5)
WBC: 8.6 10*3/uL (ref 4.0–10.5)
nRBC: 0 % (ref 0.0–0.2)

## 2020-10-03 LAB — TROPONIN I (HIGH SENSITIVITY)
Troponin I (High Sensitivity): 8 ng/L (ref <18)
Troponin I (High Sensitivity): 8 ng/L (ref <18)

## 2020-10-03 NOTE — ED Triage Notes (Signed)
Pt c/o increased chest pain and shortness of breath. Hypoxic on arrival, 2L [place] applied. Pt states that she has O2 at home that she uses when needed. Reports she has been out of her lasix, denies weight gain or increased swelling. Also c/o right leg pain, denies fall/injury.

## 2020-10-04 ENCOUNTER — Emergency Department (HOSPITAL_COMMUNITY): Payer: Medicare PPO

## 2020-10-04 ENCOUNTER — Other Ambulatory Visit (HOSPITAL_COMMUNITY): Payer: Self-pay | Admitting: Emergency Medicine

## 2020-10-04 ENCOUNTER — Encounter (HOSPITAL_COMMUNITY): Payer: Self-pay | Admitting: Emergency Medicine

## 2020-10-04 LAB — SARS CORONAVIRUS 2 BY RT PCR (HOSPITAL ORDER, PERFORMED IN ~~LOC~~ HOSPITAL LAB): SARS Coronavirus 2: NEGATIVE

## 2020-10-04 MED ORDER — APIXABAN 5 MG PO TABS: 5.0000 mg | ORAL_TABLET | Freq: Two times a day (BID) | ORAL | Status: DC

## 2020-10-04 MED ORDER — APIXABAN 5 MG PO TABS
5.0000 mg | ORAL_TABLET | Freq: Two times a day (BID) | ORAL | 0 refills | Status: DC
Start: 2020-10-04 — End: 2020-10-04

## 2020-10-04 MED ORDER — ALBUTEROL SULFATE HFA 108 (90 BASE) MCG/ACT IN AERS
2.0000 | INHALATION_SPRAY | Freq: Once | RESPIRATORY_TRACT | Status: AC
  Administered 2020-10-04: 2 via RESPIRATORY_TRACT
  Filled 2020-10-04: qty 6.7

## 2020-10-04 MED ORDER — KETOROLAC TROMETHAMINE 30 MG/ML IJ SOLN
15.0000 mg | Freq: Once | INTRAMUSCULAR | Status: AC
  Administered 2020-10-04: 15 mg via INTRAVENOUS
  Filled 2020-10-04: qty 1

## 2020-10-04 MED ORDER — DOXYCYCLINE HYCLATE 100 MG PO CAPS: 100.0000 mg | ORAL_CAPSULE | Freq: Two times a day (BID) | ORAL | 0 refills | Status: DC

## 2020-10-04 MED ORDER — SODIUM CHLORIDE 0.9 % IV SOLN
500.0000 mg | Freq: Once | INTRAVENOUS | Status: AC
  Administered 2020-10-04: 500 mg via INTRAVENOUS
  Filled 2020-10-04: qty 500

## 2020-10-04 MED ORDER — IOHEXOL 350 MG/ML SOLN
100.0000 mL | Freq: Once | INTRAVENOUS | Status: AC | PRN
  Administered 2020-10-04: 100 mL via INTRAVENOUS

## 2020-10-04 NOTE — ED Provider Notes (Signed)
[facility] EMERGENCY DEPARTMENT Provider Note   CSN: [id number] Arrival date & time: 10/03/20  1516     History Chief Complaint  Patient presents with  . Chest Pain  . Shortness of Breath    Sherri Hart is a 75 y.o. female.  The history is provided by the patient.  Chest Pain Pain location:  Substernal area Pain quality: dull   Pain radiates to:  Does not radiate Pain severity:  Moderate Onset quality:  Gradual Timing:  Constant Progression:  Unchanged Chronicity:  New Context: at rest   Relieved by:  Nothing Worsened by:  Nothing Ineffective treatments:  None tried Associated symptoms: shortness of breath   Associated symptoms: no abdominal pain, no dizziness and no fever   Risk factors: no coronary artery disease and not female   Shortness of Breath Associated symptoms: chest pain   Associated symptoms: no abdominal pain, no fever and no rash   Patient with COPD who wears PRN oxygen presents with SOB and CP after 2 long car trips to [place].  She has missed several days of her medication. No f/c/r.  Some congestion.  Patient is not covid vaccinated.      Past Medical History:  Diagnosis Date  . Atrial fibrillation (HCC)   . Congestive heart failure [facility])     Patient Active Problem List   Diagnosis Date Noted  . Acute diastolic (congestive) heart failure (HCC) 07/05/2020  . Elevated troponin level not due myocardial infarction 07/04/2020  . Hypomagnesemia 07/04/2020  . Atrial fibrillation with rapid ventricular response (HCC) 07/04/2020  . Acute cardiogenic pulmonary edema (HCC) 07/04/2020  . COPD with acute exacerbation (HCC) 07/04/2020  . Nicotine dependence, cigarettes, uncomplicated 07/04/2020    History reviewed. No pertinent surgical history.   OB History   No obstetric history on file.     History reviewed. No pertinent family history.     Home Medications Prior to Admission medications   Medication Sig Start Date  End Date Taking? Authorizing Provider  acetaminophen (TYLENOL) 500 MG tablet Take 500 mg by mouth every 6 (six) hours as needed for moderate pain.    [provider]  albuterol (VENTOLIN HFA) 108 (90 Base) MCG/ACT inhaler Inhale 2 puffs into the lungs every 6 (six) hours as needed for wheezing or shortness of breath. 07/07/20   Almon Hercules, MD  apixaban (ELIQUIS) 5 MG TABS tablet Take 1 tablet (5 mg total) by mouth 2 (two) times daily. 07/07/20   Almon Hercules, MD  diltiazem (CARDIZEM CD) 180 MG 24 hr capsule Take 1 capsule (180 mg total) by mouth daily. 07/07/20   Almon Hercules, MD  furosemide (LASIX) 40 MG tablet Take 1 tablet (40 mg total) by mouth daily. 07/07/20   Almon Hercules, MD  tiotropium (SPIRIVA HANDIHALER) 18 MCG inhalation capsule Place 1 capsule (18 mcg total) into inhaler and inhale daily. 07/07/20 10/05/20  Almon Hercules, MD    Allergies    Penicillins  Review of Systems   Review of Systems  Constitutional: Negative for fever.  HENT: Negative for congestion.   Eyes: Negative for visual disturbance.  Respiratory: Positive for shortness of breath.   Cardiovascular: Positive for chest pain.  Gastrointestinal: Negative for abdominal pain.  Genitourinary: Negative for difficulty urinating.  Musculoskeletal: Negative for arthralgias.  Skin: Negative for rash.  Neurological: Negative for dizziness.  Psychiatric/Behavioral: Negative for agitation.  All other systems reviewed and are negative.   Physical Exam Updated Vital  Signs BP (!) 111/91   Pulse 80   Temp (!) 97.5 F (36.4 C) (Oral)   Resp 20   SpO2 92%   Physical Exam Vitals and nursing note reviewed.  Constitutional:      General: She is not in acute distress.    Appearance: Normal appearance.  HENT:     Head: Normocephalic and atraumatic.  Eyes:     Conjunctiva/sclera: Conjunctivae normal.     Pupils: Pupils are equal, round, and reactive to light.  Cardiovascular:     Rate and Rhythm: Normal  rate. Rhythm irregular.     Pulses: Normal pulses.     Heart sounds: Normal heart sounds.  Pulmonary:     Breath sounds: Decreased air movement present. No rales.  Abdominal:     General: Abdomen is flat. Bowel sounds are normal.     Palpations: Abdomen is soft.     Tenderness: There is no abdominal tenderness. There is no guarding.  Musculoskeletal:        General: Normal range of motion.     Cervical back: Normal range of motion and neck supple.     Right lower leg: No edema.  Skin:    General: Skin is warm and dry.     Capillary Refill: Capillary refill takes less than 2 seconds.  Neurological:     General: No focal deficit present.     Mental Status: She is alert and oriented to person, place, and time.     Deep Tendon Reflexes: Reflexes normal.  Psychiatric:        Mood and Affect: Mood normal.        Behavior: Behavior normal.     ED Results / Procedures / Treatments   Labs (all labs ordered are listed, but only abnormal results are displayed) Results for orders placed or performed during the hospital encounter of 10/03/20  Basic metabolic panel  Result Value Ref Range   Sodium 139 135 - 145 mmol/L   Potassium 4.5 3.5 - 5.1 mmol/L   Chloride 102 98 - 111 mmol/L   CO2 26 22 - 32 mmol/L   Glucose, Bld 95 70 - 99 mg/dL   BUN 18 8 - 23 mg/dL   Creatinine, Ser 4.38 (H) 0.44 - 1.00 mg/dL   Calcium 9.3 8.9 - 88.7 mg/dL   GFR, Estimated 52 (L) >60 mL/min   Anion gap 11 5 - 15  CBC  Result Value Ref Range   WBC 8.6 4.0 - 10.5 K/uL   RBC 4.69 3.87 - 5.11 MIL/uL   Hemoglobin 15.6 (H) 12.0 - 15.0 g/dL   HCT 57.9 (H) 72.8 - 20.6 %   MCV 101.9 (H) 80.0 - 100.0 fL   MCH 33.3 26.0 - 34.0 pg   MCHC 32.6 30.0 - 36.0 g/dL   RDW 01.5 61.5 - 37.9 %   Platelets 190 150 - 400 K/uL   nRBC 0.0 0.0 - 0.2 %  Troponin I (High Sensitivity)  Result Value Ref Range   Troponin I (High Sensitivity) 8 <18 ng/L  Troponin I (High Sensitivity)  Result Value Ref Range   Troponin I (High  Sensitivity) 8 <18 ng/L   DG Chest 2 View  Result Date: 10/03/2020 CLINICAL DATA:  Chest pain short of breath EXAM: CHEST - 2 VIEW COMPARISON:  07/04/2020 FINDINGS: Surgical hardware in the cervical spine. Mild cardiomegaly. Minimal basilar atelectasis. No focal consolidation, pleural effusion or pneumothorax. Aortic atherosclerosis IMPRESSION: Mild cardiomegaly. Minimal basilar atelectasis. Electronically Signed  By: Jasmine Pang M.D.   On: 10/03/2020 15:58    EKG EKG Interpretation  Date/Time:  Wednesday October 04 2020 01:23:18 EST Ventricular Rate:  113 PR Interval:    QRS Duration: 89 QT Interval:  304 QTC Calculation: 404 R Axis:   77 Text Interpretation: Atrial fibrillation Ventricular premature complex Repol abnrm, severe global ischemia (LM/MVD) Confirmed by Cortny Bambach ([id number]) on 10/04/2020 2:08:12 AM   Radiology DG Chest 2 View  Result Date: 10/03/2020 CLINICAL DATA:  Chest pain short of breath EXAM: CHEST - 2 VIEW COMPARISON:  07/04/2020 FINDINGS: Surgical hardware in the cervical spine. Mild cardiomegaly. Minimal basilar atelectasis. No focal consolidation, pleural effusion or pneumothorax. Aortic atherosclerosis IMPRESSION: Mild cardiomegaly. Minimal basilar atelectasis. Electronically Signed   By: Jasmine Pang M.D.   On: 10/03/2020 15:58    Procedures Procedures   Medications Ordered in ED Medications  ketorolac (TORADOL) 30 MG/ML injection 15 mg (has no administration in time range)  azithromycin (ZITHROMAX) 500 mg in sodium chloride 0.9 % 250 mL IVPB (has no administration in time range)  apixaban (ELIQUIS) tablet 5 mg (has no administration in time range)  albuterol (VENTOLIN HFA) 108 (90 Base) MCG/ACT inhaler 2 puff (has no administration in time range)  iohexol (OMNIPAQUE) 350 MG/ML injection 100 mL (100 mLs Intravenous Contrast Given 10/04/20 6160)    ED Course  I have reviewed the triage vital signs and the nursing notes.  Pertinent labs & imaging  results that were available during my care of the patient were reviewed by me and considered in my medical decision making (see chart for details).    COVID swab is negative.  Ruled out for MI in the ED as swell as PE.  Given CTA findings will treat the patient as a Community acquired PNA.  Will start doxycycline and refill eliquis which patient has run out of this week.  Treat your pain with tylenol and follow up with your PMD for ongoing care.    Halleigh Comes Block was evaluated in Emergency Department on 10/04/2020 for the symptoms described in the history of present illness. She was evaluated in the context of the global COVID-19 pandemic, which necessitated consideration that the patient might be at risk for infection with the SARS-CoV-2 virus that causes COVID-19. Institutional protocols and algorithms that pertain to the evaluation of patients at risk for COVID-19 are in a state of rapid change based on information released by regulatory bodies including the CDC and federal and state organizations. These policies and algorithms were followed during the patient's care in the ED.  Final Clinical Impression(s) / ED Diagnoses Return for intractable cough, coughing up blood, fevers >100.4 unrelieved by medication, shortness of breath, intractable vomiting, chest pain, shortness of breath, weakness, numbness, changes in speech, facial asymmetry, abdominal pain, passing out, Inability to tolerate liquids or food, cough, altered mental status or any concerns. No signs of systemic illness or infection. The patient is nontoxic-appearing on exam and vital signs are within normal limits.  I have reviewed the triage vital signs and the nursing notes. Pertinent labs & imaging results that were available during my care of the patient were reviewed by me and considered in my medical decision making (see chart for details). After history, exam, and medical workup I feel the patient has been appropriately medically  screened and is safe for discharge home. Pertinent diagnoses were discussed with the patient. Patient was given return precautions.    Quamir Willemsen, MD 10/04/20 [id number]

## 2020-10-04 NOTE — ED Notes (Addendum)
Pt informed to follow up with primary doctor to receive lasix and cardizem.

## 2020-10-04 NOTE — ED Notes (Signed)
Pt is requesting a prescription for cardizem and lasix to go with her discharge papers.

## 2020-10-30 ENCOUNTER — Telehealth (HOSPITAL_COMMUNITY): Payer: Self-pay

## 2020-10-30 NOTE — Telephone Encounter (Signed)
Transitions of Care Pharmacy   Call attempted for a pharmacy transitions of care follow-up. Patient does not have VM set up so unable to leave patient a message to return call. Call attempt #3, will no longer attempt to reach fr [facility] pharmacy follow up

## 2021-02-01 ENCOUNTER — Inpatient Hospital Stay (HOSPITAL_COMMUNITY)
Admission: EM | Admit: 2021-02-01 | Discharge: 2021-02-04 | DRG: 190 | Disposition: A | Discharge status: Home / Self Care | Payer: Medicare PPO | Attending: Student | Admitting: Student

## 2021-02-01 ENCOUNTER — Emergency Department (HOSPITAL_COMMUNITY): Payer: Medicare PPO

## 2021-02-01 ENCOUNTER — Other Ambulatory Visit: Payer: Self-pay

## 2021-02-01 ENCOUNTER — Encounter (HOSPITAL_COMMUNITY): Payer: Self-pay | Admitting: *Deleted

## 2021-02-01 DIAGNOSIS — Z9981 Dependence on supplemental oxygen: Secondary | ICD-10-CM

## 2021-02-01 DIAGNOSIS — J441 Chronic obstructive pulmonary disease with (acute) exacerbation: Secondary | ICD-10-CM | POA: Diagnosis not present

## 2021-02-01 DIAGNOSIS — Z7951 Long term (current) use of inhaled steroids: Secondary | ICD-10-CM

## 2021-02-01 DIAGNOSIS — Z9851 Tubal ligation status: Secondary | ICD-10-CM

## 2021-02-01 DIAGNOSIS — Z6836 Body mass index (BMI) 36.0-36.9, adult: Secondary | ICD-10-CM

## 2021-02-01 DIAGNOSIS — I4891 Unspecified atrial fibrillation: Secondary | ICD-10-CM | POA: Diagnosis not present

## 2021-02-01 DIAGNOSIS — Z825 Family history of asthma and other chronic lower respiratory diseases: Secondary | ICD-10-CM

## 2021-02-01 DIAGNOSIS — R0602 Shortness of breath: Secondary | ICD-10-CM | POA: Diagnosis not present

## 2021-02-01 DIAGNOSIS — Z7901 Long term (current) use of anticoagulants: Secondary | ICD-10-CM

## 2021-02-01 DIAGNOSIS — I4821 Permanent atrial fibrillation: Secondary | ICD-10-CM | POA: Diagnosis present

## 2021-02-01 DIAGNOSIS — E669 Obesity, unspecified: Secondary | ICD-10-CM | POA: Diagnosis present

## 2021-02-01 DIAGNOSIS — M25561 Pain in right knee: Secondary | ICD-10-CM | POA: Diagnosis present

## 2021-02-01 DIAGNOSIS — Z79899 Other long term (current) drug therapy: Secondary | ICD-10-CM

## 2021-02-01 DIAGNOSIS — Z9114 Patient's other noncompliance with medication regimen: Secondary | ICD-10-CM

## 2021-02-01 DIAGNOSIS — G8929 Other chronic pain: Secondary | ICD-10-CM | POA: Diagnosis present

## 2021-02-01 DIAGNOSIS — Z20822 Contact with and (suspected) exposure to covid-19: Secondary | ICD-10-CM | POA: Diagnosis present

## 2021-02-01 DIAGNOSIS — M25551 Pain in right hip: Secondary | ICD-10-CM | POA: Diagnosis present

## 2021-02-01 DIAGNOSIS — Z880 Allergy status to penicillin: Secondary | ICD-10-CM

## 2021-02-01 DIAGNOSIS — F17210 Nicotine dependence, cigarettes, uncomplicated: Secondary | ICD-10-CM | POA: Diagnosis present

## 2021-02-01 DIAGNOSIS — J9621 Acute and chronic respiratory failure with hypoxia: Secondary | ICD-10-CM | POA: Diagnosis present

## 2021-02-01 DIAGNOSIS — I5033 Acute on chronic diastolic (congestive) heart failure: Secondary | ICD-10-CM | POA: Diagnosis present

## 2021-02-01 HISTORY — DX: Chronic obstructive pulmonary disease, unspecified: J44.9

## 2021-02-01 LAB — COMPREHENSIVE METABOLIC PANEL
ALT: 9 U/L (ref 0–44)
AST: 13 U/L — ABNORMAL LOW (ref 15–41)
Albumin: 3.8 g/dL (ref 3.5–5.0)
Alkaline Phosphatase: 67 U/L (ref 38–126)
Anion gap: 7 (ref 5–15)
BUN: 19 mg/dL (ref 8–23)
CO2: 25 mmol/L (ref 22–32)
Calcium: 9.4 mg/dL (ref 8.9–10.3)
Chloride: 107 mmol/L (ref 98–111)
Creatinine, Ser: 1.03 mg/dL — ABNORMAL HIGH (ref 0.44–1.00)
GFR, Estimated: 58 mL/min — ABNORMAL LOW (ref >60)
Glucose, Bld: 99 mg/dL (ref 70–99)
Potassium: 4.4 mmol/L (ref 3.5–5.1)
Sodium: 139 mmol/L (ref 135–145)
Total Bilirubin: 0.9 mg/dL (ref 0.3–1.2)
Total Protein: 6.9 g/dL (ref 6.5–8.1)

## 2021-02-01 LAB — CBC WITH DIFFERENTIAL/PLATELET
Abs Immature Granulocytes: 0.03 10*3/uL (ref 0.00–0.07)
Basophils Absolute: 0 10*3/uL (ref 0.0–0.1)
Basophils Relative: 0 %
Eosinophils Absolute: 0.2 10*3/uL (ref 0.0–0.5)
Eosinophils Relative: 3 %
HCT: 46.3 % — ABNORMAL HIGH (ref 36.0–46.0)
Hemoglobin: 14.9 g/dL (ref 12.0–15.0)
Immature Granulocytes: 0 %
Lymphocytes Relative: 26 %
Lymphs Abs: 1.8 10*3/uL (ref 0.7–4.0)
MCH: 33 pg (ref 26.0–34.0)
MCHC: 32.2 g/dL (ref 30.0–36.0)
MCV: 102.7 fL — ABNORMAL HIGH (ref 80.0–100.0)
Monocytes Absolute: 0.6 10*3/uL (ref 0.1–1.0)
Monocytes Relative: 8 %
Neutro Abs: 4.4 10*3/uL (ref 1.7–7.7)
Neutrophils Relative %: 63 %
Platelets: 184 10*3/uL (ref 150–400)
RBC: 4.51 MIL/uL (ref 3.87–5.11)
RDW: 13.5 % (ref 11.5–15.5)
WBC: 7.1 10*3/uL (ref 4.0–10.5)
nRBC: 0 % (ref 0.0–0.2)

## 2021-02-01 LAB — RESP PANEL BY RT-PCR (FLU A&B, COVID) ARPGX2
Influenza A by PCR: NEGATIVE
Influenza B by PCR: NEGATIVE
SARS Coronavirus 2 by RT PCR: NEGATIVE

## 2021-02-01 LAB — I-STAT ARTERIAL BLOOD GAS, ED
Acid-base deficit: 2 mmol/L (ref 0.0–2.0)
Bicarbonate: 22.7 mmol/L (ref 20.0–28.0)
Calcium, Ion: 1.28 mmol/L (ref 1.15–1.40)
HCT: 42 % (ref 36.0–46.0)
Hemoglobin: 14.3 g/dL (ref 12.0–15.0)
O2 Saturation: 94 %
Potassium: 4.1 mmol/L (ref 3.5–5.1)
Sodium: 140 mmol/L (ref 135–145)
TCO2: 24 mmol/L (ref 22–32)
pCO2 arterial: 36.7 mmHg (ref 32.0–48.0)
pH, Arterial: 7.4 (ref 7.350–7.450)
pO2, Arterial: 69 mmHg — ABNORMAL LOW (ref 83.0–108.0)

## 2021-02-01 LAB — TROPONIN I (HIGH SENSITIVITY)
Troponin I (High Sensitivity): 10 ng/L (ref <18)
Troponin I (High Sensitivity): 12 ng/L (ref <18)

## 2021-02-01 LAB — BRAIN NATRIURETIC PEPTIDE: B Natriuretic Peptide: 258.1 pg/mL — ABNORMAL HIGH (ref 0.0–100.0)

## 2021-02-01 LAB — D-DIMER, QUANTITATIVE: D-Dimer, Quant: 0.38 ug/mL-FEU (ref 0.00–0.50)

## 2021-02-01 MED ORDER — LORATADINE 10 MG PO TABS
10.0000 mg | ORAL_TABLET | Freq: Every day | ORAL | Status: DC | PRN
  Administered 2021-02-01 – 2021-02-03 (×3): 10 mg via ORAL
  Filled 2021-02-01 (×4): qty 1

## 2021-02-01 MED ORDER — ALBUTEROL SULFATE (2.5 MG/3ML) 0.083% IN NEBU: 3.0000 mL | INHALATION_SOLUTION | Freq: Four times a day (QID) | RESPIRATORY_TRACT | Status: DC | PRN

## 2021-02-01 MED ORDER — METOPROLOL TARTRATE 5 MG/5ML IV SOLN: 5.0000 mg | Freq: Four times a day (QID) | INTRAVENOUS | Status: DC | PRN

## 2021-02-01 MED ORDER — AZITHROMYCIN 500 MG PO TABS
500.0000 mg | ORAL_TABLET | Freq: Every day | ORAL | Status: DC
  Administered 2021-02-02 – 2021-02-04 (×3): 500 mg via ORAL
  Filled 2021-02-01 (×3): qty 1

## 2021-02-01 MED ORDER — MORPHINE SULFATE (PF) 2 MG/ML IV SOLN
1.0000 mg | INTRAVENOUS | Status: DC | PRN
  Administered 2021-02-01 – 2021-02-02 (×2): 1 mg via INTRAVENOUS
  Filled 2021-02-01 (×2): qty 1

## 2021-02-01 MED ORDER — DILTIAZEM HCL 25 MG/5ML IV SOLN
20.0000 mg | Freq: Once | INTRAVENOUS | Status: AC
  Administered 2021-02-01: 20 mg via INTRAVENOUS
  Filled 2021-02-01: qty 5

## 2021-02-01 MED ORDER — METHYLPREDNISOLONE SODIUM SUCC 40 MG IJ SOLR
40.0000 mg | Freq: Two times a day (BID) | INTRAMUSCULAR | Status: AC
Start: 2021-02-02 — End: 2021-02-02
  Administered 2021-02-02 (×2): 40 mg via INTRAVENOUS
  Filled 2021-02-01 (×2): qty 1

## 2021-02-01 MED ORDER — DILTIAZEM HCL 60 MG PO TABS
60.0000 mg | ORAL_TABLET | Freq: Four times a day (QID) | ORAL | Status: DC
  Administered 2021-02-02 – 2021-02-03 (×6): 60 mg via ORAL
  Filled 2021-02-01 (×7): qty 1

## 2021-02-01 MED ORDER — DILTIAZEM HCL ER COATED BEADS 180 MG PO CP24
180.0000 mg | ORAL_CAPSULE | Freq: Once | ORAL | Status: AC
  Administered 2021-02-01: 180 mg via ORAL
  Filled 2021-02-01: qty 1

## 2021-02-01 MED ORDER — SODIUM CHLORIDE 0.9 % IV SOLN
500.0000 mg | INTRAVENOUS | Status: AC
  Administered 2021-02-01: 500 mg via INTRAVENOUS
  Filled 2021-02-01: qty 500

## 2021-02-01 MED ORDER — FUROSEMIDE 40 MG PO TABS
40.0000 mg | ORAL_TABLET | Freq: Every day | ORAL | Status: DC
  Administered 2021-02-01 – 2021-02-04 (×4): 40 mg via ORAL
  Filled 2021-02-01 (×4): qty 1

## 2021-02-01 MED ORDER — FENTANYL CITRATE (PF) 100 MCG/2ML IJ SOLN
50.0000 ug | Freq: Once | INTRAMUSCULAR | Status: AC
  Administered 2021-02-01: 50 ug via INTRAVENOUS
  Filled 2021-02-01: qty 2

## 2021-02-01 MED ORDER — DILTIAZEM HCL ER COATED BEADS 240 MG PO CP24: 240.0000 mg | ORAL_CAPSULE | Freq: Every day | ORAL | Status: DC

## 2021-02-01 MED ORDER — METOPROLOL TARTRATE 5 MG/5ML IV SOLN: 5.0000 mg | INTRAVENOUS | Status: DC | PRN

## 2021-02-01 MED ORDER — DOXYCYCLINE HYCLATE 100 MG PO TABS
100.0000 mg | ORAL_TABLET | Freq: Once | ORAL | Status: AC
  Administered 2021-02-01: 100 mg via ORAL
  Filled 2021-02-01: qty 1

## 2021-02-01 MED ORDER — DICLOFENAC SODIUM 1 % EX GEL
2.0000 g | Freq: Four times a day (QID) | CUTANEOUS | Status: DC
  Administered 2021-02-02 – 2021-02-04 (×9): 2 g via TOPICAL
  Filled 2021-02-01: qty 100

## 2021-02-01 MED ORDER — TIOTROPIUM BROMIDE MONOHYDRATE 18 MCG IN CAPS: 18.0000 ug | ORAL_CAPSULE | Freq: Every day | RESPIRATORY_TRACT | Status: DC

## 2021-02-01 MED ORDER — ACETAMINOPHEN 500 MG PO TABS: 500.0000 mg | ORAL_TABLET | Freq: Four times a day (QID) | ORAL | Status: DC | PRN

## 2021-02-01 MED ORDER — PREDNISONE 20 MG PO TABS: 40.0000 mg | ORAL_TABLET | Freq: Every day | ORAL | Status: DC

## 2021-02-01 MED ORDER — HYDROCODONE-ACETAMINOPHEN 5-325 MG PO TABS
1.0000 | ORAL_TABLET | Freq: Once | ORAL | Status: AC
  Administered 2021-02-01: 1 via ORAL
  Filled 2021-02-01: qty 1

## 2021-02-01 MED ORDER — NICOTINE 14 MG/24HR TD PT24
14.0000 mg | MEDICATED_PATCH | Freq: Every day | TRANSDERMAL | Status: DC
  Administered 2021-02-01 – 2021-02-04 (×4): 14 mg via TRANSDERMAL
  Filled 2021-02-01 (×4): qty 1

## 2021-02-01 MED ORDER — METHYLPREDNISOLONE SODIUM SUCC 125 MG IJ SOLR
125.0000 mg | Freq: Once | INTRAMUSCULAR | Status: AC
  Administered 2021-02-01: 125 mg via INTRAVENOUS
  Filled 2021-02-01: qty 2

## 2021-02-01 MED ORDER — IPRATROPIUM BROMIDE 0.02 % IN SOLN
0.5000 mg | Freq: Four times a day (QID) | RESPIRATORY_TRACT | Status: DC
  Administered 2021-02-01: 0.5 mg via RESPIRATORY_TRACT
  Filled 2021-02-01: qty 2.5

## 2021-02-01 MED ORDER — HYDROCODONE-ACETAMINOPHEN 5-325 MG PO TABS
1.0000 | ORAL_TABLET | ORAL | Status: DC | PRN
  Administered 2021-02-02 – 2021-02-04 (×13): 1 via ORAL
  Filled 2021-02-01 (×13): qty 1

## 2021-02-01 MED ORDER — APIXABAN 5 MG PO TABS
5.0000 mg | ORAL_TABLET | Freq: Two times a day (BID) | ORAL | Status: DC
  Administered 2021-02-01 – 2021-02-04 (×6): 5 mg via ORAL
  Filled 2021-02-01 (×6): qty 1

## 2021-02-01 MED ORDER — IPRATROPIUM-ALBUTEROL 0.5-2.5 (3) MG/3ML IN SOLN
3.0000 mL | RESPIRATORY_TRACT | Status: AC
  Administered 2021-02-01 (×3): 3 mL via RESPIRATORY_TRACT
  Filled 2021-02-01: qty 3
  Filled 2021-02-01: qty 6

## 2021-02-01 NOTE — H&P (Signed)
History and Physical    Sherri Hart Block GEX:[id number] DOB: Sep 20, 1945 DOA: 02/01/2021  PCP: No primary care physician  Chief Complaint: Shortness of breath, chest pain  HPI: Sherri Hart is a 75 y.o. female with a past medical history of CHF with unknown ejection fraction, atrial fibrillation, COPD with chronic hypoxic respiratory failure on 2 L oxygen as needed.  She presented to the emergency department for chest pain and shortness of breath.  States that she fell on Saturday and did not hit her head or lose consciousness.  Since then she has been having increased right hip pain and right knee pain.  She has chronic right hip pain and right knee pain.  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  She recently moved here from [place] approximately 4-1/2 months ago.  She takes care of her grandchildren after her daughter was murdered.  She states that recently she has been going back and forth from [place] for the past few months.  She has not been able to establish a primary care physician.  Her chest pain is reproducible on palpation.  Located left side.  No significant radiation.  Describes as sharp like.  She states that she does get this chest pain feeling when her atrial fib is "acting up".  In terms of her shortness of breath.  She states that she has been having wheezing for the past few days.  Receiving treatment in the emergency department she states that her breathing feels better.  She is asking for a nicotine patch.    ED Course: Solu-Medrol, duo nebs x3, doxycycline, diltiazem IV, diltiazem p.o.  Review of Systems: As per HPI; otherwise review of systems reviewed and negative.    Past Medical History:  Diagnosis Date  . Atrial fibrillation (HCC)   . Congestive heart failure (HCC)   . COPD (chronic obstructive pulmonary disease) (HCC)     Past Surgical History:  Procedure Laterality Date  . BACK SURGERY    . CHOLECYSTECTOMY    . NECK SURGERY    .  REPLACEMENT TOTAL KNEE Right   . TUBAL LIGATION      Social History   Socioeconomic History  . Marital status: Single    Spouse name: Not on file  . Number of children: Not on file  . Years of education: Not on file  . Highest education level: Not on file  Occupational History  . Not on file  Tobacco Use  . Smoking status: Current Every Day Smoker    Packs/day: 0.50    Types: Cigarettes  . Smokeless tobacco: Never Used  Substance and Sexual Activity  . Alcohol use: Never  . Drug use: Never  . Sexual activity: Not on file  Other Topics Concern  . Not on file  Social History Narrative  . Not on file   Social Determinants of Health   Financial Resource Strain: Not on file  Food Insecurity: Not on file  Transportation Needs: No Transportation Needs  . Lack of Transportation (Medical): No  . Lack of Transportation (Non-Medical): No  Physical Activity: Not on file  Stress: Not on file  Social Connections: Not on file  Intimate Partner Violence: Not on file    Allergies  Allergen Reactions  . Penicillins     Did it involve swelling of the face/tongue/throat, SOB, or low BP? Yes Did it involve sudden or severe rash/hives, skin peeling, or any reaction on the inside of your mouth or nose? N  Did you need to seek medical attention at a hospital or doctor's office? Y When did it last happen?childhood If all above answers are "NO", may proceed with cephalosporin use.     Family History  Problem Relation Age of Onset  . COPD Other     Prior to Admission medications   Medication Sig Start Date End Date Taking? Authorizing Provider  acetaminophen (TYLENOL) 500 MG tablet Take 500 mg by mouth every 6 (six) hours as needed for moderate pain.   Yes [provider]  albuterol (VENTOLIN HFA) 108 (90 Base) MCG/ACT inhaler Inhale 2 puffs into the lungs every 6 (six) hours as needed for wheezing or shortness of breath. 07/07/20  Yes Almon Hercules, MD  apixaban  (ELIQUIS) 5 MG TABS tablet TAKE 1 TABLET BY MOUTH 2 TIMES DAILY Patient taking differently: Take 5 mg by mouth 2 (two) times daily. 10/04/20 10/04/21 Yes Palumbo, April, MD  diltiazem (CARDIZEM CD) 180 MG 24 hr capsule Take 1 capsule (180 mg total) by mouth daily. 07/07/20  Yes Almon Hercules, MD  furosemide (LASIX) 40 MG tablet Take 1 tablet (40 mg total) by mouth daily. 07/07/20  Yes Almon Hercules, MD  loratadine (CLARITIN) 10 MG tablet Take 10 mg by mouth daily as needed for allergies.   Yes [provider]  OVER THE COUNTER MEDICATION Take 1 puff by mouth 2 (two) times daily as needed (wheezing/shortness of breath.). Pramatine mist inhaler   Yes [provider]  doxycycline (VIBRA-TABS) 100 MG tablet TAKE 1 TABLET (100 MG TOTAL) BY MOUTH TWO TIMES DAILY FOR 7 DAYS Patient not taking: No sig reported 10/04/20 10/04/21  Palumbo, April, MD  doxycycline (VIBRAMYCIN) 100 MG capsule Take 1 capsule (100 mg total) by mouth 2 (two) times daily. One po bid x 7 days Patient not taking: No sig reported 10/04/20   Palumbo, April, MD  tiotropium (SPIRIVA HANDIHALER) 18 MCG inhalation capsule Place 1 capsule (18 mcg total) into inhaler and inhale daily. 07/07/20 10/05/20  Almon Hercules, MD    Physical Exam: Vitals:   02/01/21 1715 02/01/21 1745 02/01/21 1800 02/01/21 1915  BP: 124/90 (!) 143/110 (!) 154/127 126/72  Pulse: 96 (!) 125 (!) 128 (!) 103  Resp: 17 (!) 30 (!) 25 (!) 24  Temp:      TempSrc:      SpO2: 97% 92% 92% 93%     . General:  Appears calm and comfortable and is in NAD . Cardiovascular: Irregular rhythm, irregular rate.  No murmurs. Marland Kitchen Respiratory:   Mild expiratory wheezing.  Normal respiratory effort. . Abdomen:  soft, NT, ND, NABS . Skin:  no rash or induration seen on limited exam . Musculoskeletal:  grossly normal tone BUE/BLE, good ROM, no bony abnormality . Lower extremity:  Trace LE edema.  Limited foot exam with no ulcerations.  2+ distal pulses.  Mild to  moderate tenderness to palpation of the right knee.  Mild to moderate right knee swelling. Marland Kitchen Psychiatric:  grossly normal mood and affect, speech fluent and appropriate, AOx3 . Neurologic:  CN 2-12 grossly intact, moves all extremities in coordinated fashion, sensation intact    Radiological Exams on Admission: Independently reviewed - see discussion in A/P where applicable  DG Chest 2 View  Result Date: 02/01/2021 CLINICAL DATA:  Fall 4 days ago EXAM: CHEST - 2 VIEW COMPARISON:  10/03/2020 FINDINGS: Heart size upper normal. Atherosclerotic calcification aortic arch. Negative for heart failure. Mild atelectasis or scarring in the lingula  unchanged from prior studies. No acute infiltrate or effusion. IMPRESSION: No active cardiopulmonary disease. Electronically Signed   By: Marlan Palau M.D.   On: 02/01/2021 13:53   CT Head Wo Contrast  Result Date: 02/01/2021 CLINICAL DATA:  Head trauma. Recent fall. History of atrial fibrillation. EXAM: CT HEAD WITHOUT CONTRAST TECHNIQUE: Contiguous axial images were obtained from the base of the skull through the vertex without intravenous contrast. COMPARISON:  None. FINDINGS: Brain: There is no evidence of an acute infarct, intracranial hemorrhage, mass, midline shift, or extra-axial fluid collection. Mild cerebral atrophy is within normal limits for age. Hypodensities in the cerebral white matter bilaterally are nonspecific but compatible with mild chronic small vessel ischemic disease. Vascular: Calcified atherosclerosis at the skull base. No hyperdense vessel. Skull: No fracture or suspicious osseous lesion. Sinuses/Orbits: Mild posterior right ethmoid air cell opacification. Clear mastoid air cells. Unremarkable orbits. Other: None. IMPRESSION: 1. No evidence of acute intracranial abnormality. 2. Mild chronic small vessel ischemic disease. Electronically Signed   By: Sebastian Ache M.D.   On: 02/01/2021 14:29   DG Knee Complete 4 Views Right  Result Date:  02/01/2021 CLINICAL DATA:  Fall EXAM: RIGHT KNEE - COMPLETE 4+ VIEW COMPARISON:  None. 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 No prosthetic loosening. There is a small joint effusion. Negative for fracture. IMPRESSION: Negative for fracture.  Right knee replacement Electronically Signed   By: Marlan Palau M.D.   On: 02/01/2021 13:52   DG Hip Unilat W or Wo Pelvis 2-3 Views Right  Result Date: 02/01/2021 CLINICAL DATA:  Fall 4 days ago.  Pain EXAM: DG HIP (WITH OR WITHOUT PELVIS) 2-3V RIGHT COMPARISON:  None. FINDINGS: There is no evidence of hip fracture or dislocation. There is no evidence of arthropathy or other focal bone abnormality. Cage fusion L5-S1. IMPRESSION: Negative. Electronically Signed   By: Marlan Palau M.D.   On: 02/01/2021 13:51    EKG: Independently reviewed.  Atrial fibrillation with rate 104.   Labs on Admission: I have personally reviewed the available labs and imaging studies at the time of the admission.  Pertinent labs: D-dimer negative, BNP 258, troponin 10, repeat troponin 12, creatinine 1.03, ABG shows a PO2 of 69.     Assessment: Atrial fibrillation with rapid ventricular response Chest pain likely secondary to above Acute hypoxemic respiratory failure secondary to COPD exacerbation Right knee pain secondary to recent fall Tobacco dependence COPD with chronic hypoxic respiratory failure on 2 L oxygen as needed CHF with unknown ejection fraction, not in acute decompensation    Plan: The patient will be admitted to the cardiac telemetry floor under observation status.  We will consult cardiology.  I have spoken personally with Dr. Wyline Mood and they will see the patient in consultation.  The patient has been given IV diltiazem and p.o. diltiazem (her home dose) in the emergency department.  She still remains in A. fib with heart rate in the 130s upon my evaluation.  This was prior to getting her Cardizem IV push.  Continue patient's home Eliquis  for stroke prophylaxis.  We will obtain an echocardiogram.  I have ordered Lopressor IV as needed for heart rates greater than 130.  I have increased her diltiazem dose from 180 mg to 240 mg daily.  Troponins are not significantly elevated.  D-dimer was negative.  We will obtain a duplex ultrasound of bilateral lower extremities to rule out DVT.  Plain film imaging of her hip and right knee showed no acute findings.  Will treat with supportive care including Voltaren gel and pain medications as needed.  Plain film imaging of her chest shows no acute cardiopulmonary process.  No infiltrate or consolidation noted.  No pleural effusions or pulmonary edema noted. She will be provided a nicotine patch for her tobacco dependence.  She has been counseled on smoking cessation.  For her mild COPD exacerbation we will start IV steroids, Zithromax and breathing treatments scheduled.  In addition chest PT, flutter valve and incentive spirometry has been ordered. Moderate risk.   Level of care: Telemetry Cardiac DVT prophylaxis: Eliquis Code Status: Full Consults called: Cardiology-Dr. Wyline Mood Admission status: Observation   Verdia Kuba MD [facility]   How to contact the [facility] Attending or Consulting provider 7A - 7P or covering provider during after hours 7P -7A, for this patient?  1. Check the care team in [facility] and look for a) attending/consulting TR[facility] provider listed and b) the [facility] team listed 2. Log into [email or web address] and use [facility] universal password to access. If you do not have the password, please contact the hospital operator. 3. Locate the [facility] provider you are looking for under [facility] and page to a number that you can be directly reached. 4. If you still have difficulty reaching the provider, please page the [facility] (Director on Call) for the Hospitalists listed on amion for assistance.   02/01/2021, 7:38 PM

## 2021-02-01 NOTE — ED Provider Notes (Signed)
[facility] EMERGENCY DEPARTMENT Provider Note   CSN: [id number] Arrival date & time: 02/01/21  1230     History Chief Complaint  Patient presents with  . Atrial Fibrillation  . Chest Pain    Sherri Hart is a 75 y.o. female.  HPI 75 year old female with history of atrial fibrillation, CHF, COPD, and pulmonary edema presents emergency department for shortness of breath and chest pain.  She states that she fell on Saturday, did not hit her head or lose consciousness.  Reports having increased right hip and knee pain, similar to but worse than her chronic right hip and knee pain.  Does not feel like anything has been broken.  Since the fall, reports increased shortness of breath and chest pain.  The pain feels like pressure in the middle of her chest, does not radiate.  Exertion and knee pain make it worse, rest makes it better.  Denies history of symptoms like this previously.  Also feels like she is having palpitations that make the pain worse.  Has not been taking anything for the symptoms as she does not feel ibuprofen and Tylenol help. Does endorse long car rides to and from [place].  States she is normally on 2 L of oxygen at home as needed for shortness of breath, has been using that more often.  Also endorses increased cough with sputum production for the last 3 days.    Past Medical History:  Diagnosis Date  . Atrial fibrillation (HCC)   . Congestive heart failure [facility])     Patient Active Problem List   Diagnosis Date Noted  . Acute diastolic (congestive) heart failure (HCC) 07/05/2020  . Elevated troponin level not due myocardial infarction 07/04/2020  . Hypomagnesemia 07/04/2020  . Atrial fibrillation with rapid ventricular response (HCC) 07/04/2020  . Acute cardiogenic pulmonary edema (HCC) 07/04/2020  . COPD with acute exacerbation (HCC) 07/04/2020  . Nicotine dependence, cigarettes, uncomplicated 07/04/2020    History reviewed. No pertinent  surgical history.   OB History   No obstetric history on file.     History reviewed. No pertinent family history.     Home Medications Prior to Admission medications   Medication Sig Start Date End Date Taking? Authorizing Provider  acetaminophen (TYLENOL) 500 MG tablet Take 500 mg by mouth every 6 (six) hours as needed for moderate pain.   Yes [provider]  albuterol (VENTOLIN HFA) 108 (90 Base) MCG/ACT inhaler Inhale 2 puffs into the lungs every 6 (six) hours as needed for wheezing or shortness of breath. 07/07/20  Yes Almon Hercules, MD  apixaban (ELIQUIS) 5 MG TABS tablet TAKE 1 TABLET BY MOUTH 2 TIMES DAILY Patient taking differently: Take 5 mg by mouth 2 (two) times daily. 10/04/20 10/04/21 Yes Palumbo, April, MD  diltiazem (CARDIZEM CD) 180 MG 24 hr capsule Take 1 capsule (180 mg total) by mouth daily. 07/07/20  Yes Almon Hercules, MD  furosemide (LASIX) 40 MG tablet Take 1 tablet (40 mg total) by mouth daily. 07/07/20  Yes Almon Hercules, MD  loratadine (CLARITIN) 10 MG tablet Take 10 mg by mouth daily as needed for allergies.   Yes [provider]  OVER THE COUNTER MEDICATION Take 1 puff by mouth 2 (two) times daily as needed (wheezing/shortness of breath.). Pramatine mist inhaler   Yes [provider]  doxycycline (VIBRA-TABS) 100 MG tablet TAKE 1 TABLET (100 MG TOTAL) BY MOUTH TWO TIMES DAILY FOR 7 DAYS Patient not taking: No sig  reported 10/04/20 10/04/21  Palumbo, April, MD  doxycycline (VIBRAMYCIN) 100 MG capsule Take 1 capsule (100 mg total) by mouth 2 (two) times daily. One po bid x 7 days Patient not taking: No sig reported 10/04/20   Palumbo, April, MD  tiotropium (SPIRIVA HANDIHALER) 18 MCG inhalation capsule Place 1 capsule (18 mcg total) into inhaler and inhale daily. 07/07/20 10/05/20  Almon Hercules, MD    Allergies    Penicillins  Review of Systems   Review of Systems  Constitutional: Negative for chills and fever.  HENT: Negative for ear  pain and sore throat.   Eyes: Negative for pain and visual disturbance.  Respiratory: Positive for cough, shortness of breath and wheezing.   Cardiovascular: Positive for chest pain and palpitations.  Gastrointestinal: Negative for abdominal pain and vomiting.  Genitourinary: Negative for dysuria and hematuria.  Musculoskeletal: Positive for arthralgias and joint swelling. Negative for back pain.  Skin: Negative for color change and rash.  Neurological: Negative for seizures and syncope.  Psychiatric/Behavioral: Positive for sleep disturbance.  All other systems reviewed and are negative.   Physical Exam Updated Vital Signs BP (!) 152/85   Pulse 80   Temp 98.3 F (36.8 C) (Oral)   Resp 20   SpO2 94%   Physical Exam Vitals and nursing note reviewed.  Constitutional:      General: She is not in acute distress.    Appearance: She is well-developed.  HENT:     Head: Normocephalic and atraumatic.  Eyes:     Conjunctiva/sclera: Conjunctivae normal.  Cardiovascular:     Rate and Rhythm: Normal rate. Rhythm irregular.     Heart sounds: No murmur heard.   Pulmonary:     Effort: Tachypnea present.     Breath sounds: Wheezing present.  Chest:     Chest wall: No tenderness.  Abdominal:     Palpations: Abdomen is soft.     Tenderness: There is no abdominal tenderness. There is no guarding.  Musculoskeletal:     Cervical back: Neck supple.     Right lower leg: No tenderness. No edema.     Left lower leg: No tenderness. No edema.  Skin:    General: Skin is warm and dry.     Capillary Refill: Capillary refill takes less than 2 seconds.  Neurological:     General: No focal deficit present.     Mental Status: She is alert and oriented to person, place, and time.  Psychiatric:        Mood and Affect: Mood normal.        Behavior: Behavior normal.     ED Results / Procedures / Treatments   Labs (all labs ordered are listed, but only abnormal results are displayed) Labs  Reviewed  CBC WITH DIFFERENTIAL/PLATELET - Abnormal; Notable for the following components:      Result Value   HCT 46.3 (*)    MCV 102.7 (*)    All other components within normal limits  COMPREHENSIVE METABOLIC PANEL - Abnormal; Notable for the following components:   Creatinine, Ser 1.03 (*)    AST 13 (*)    GFR, Estimated 58 (*)    All other components within normal limits  BRAIN NATRIURETIC PEPTIDE - Abnormal; Notable for the following components:   B Natriuretic Peptide 258.1 (*)    All other components within normal limits  I-STAT ARTERIAL BLOOD GAS, ED - Abnormal; Notable for the following components:   pO2, Arterial 69 (*)  All other components within normal limits  RESP PANEL BY RT-PCR (FLU A&B, COVID) ARPGX2  D-DIMER, QUANTITATIVE  TROPONIN I (HIGH SENSITIVITY)  TROPONIN I (HIGH SENSITIVITY)    EKG None  Radiology DG Chest 2 View  Result Date: 02/01/2021 CLINICAL DATA:  Fall 4 days ago EXAM: CHEST - 2 VIEW COMPARISON:  10/03/2020 FINDINGS: Heart size upper normal. Atherosclerotic calcification aortic arch. Negative for heart failure. Mild atelectasis or scarring in the lingula unchanged from prior studies. No acute infiltrate or effusion. IMPRESSION: No active cardiopulmonary disease. Electronically Signed   By: Marlan Palau M.D.   On: 02/01/2021 13:53   CT Head Wo Contrast  Result Date: 02/01/2021 CLINICAL DATA:  Head trauma. Recent fall. History of atrial fibrillation. EXAM: CT HEAD WITHOUT CONTRAST TECHNIQUE: Contiguous axial images were obtained from the base of the skull through the vertex without intravenous contrast. COMPARISON:  None. FINDINGS: Brain: There is no evidence of an acute infarct, intracranial hemorrhage, mass, midline shift, or extra-axial fluid collection. Mild cerebral atrophy is within normal limits for age. Hypodensities in the cerebral white matter bilaterally are nonspecific but compatible with mild chronic small vessel ischemic disease.  Vascular: Calcified atherosclerosis at the skull base. No hyperdense vessel. Skull: No fracture or suspicious osseous lesion. Sinuses/Orbits: Mild posterior right ethmoid air cell opacification. Clear mastoid air cells. Unremarkable orbits. Other: None. IMPRESSION: 1. No evidence of acute intracranial abnormality. 2. Mild chronic small vessel ischemic disease. Electronically Signed   By: Sebastian Ache M.D.   On: 02/01/2021 14:29   DG Knee Complete 4 Views Right  Result Date: 02/01/2021 CLINICAL DATA:  Fall EXAM: RIGHT KNEE - COMPLETE 4+ VIEW COMPARISON:  None. 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 No prosthetic loosening. There is a small joint effusion. Negative for fracture. IMPRESSION: Negative for fracture.  Right knee replacement Electronically Signed   By: Marlan Palau M.D.   On: 02/01/2021 13:52   DG Hip Unilat W or Wo Pelvis 2-3 Views Right  Result Date: 02/01/2021 CLINICAL DATA:  Fall 4 days ago.  Pain EXAM: DG HIP (WITH OR WITHOUT PELVIS) 2-3V RIGHT COMPARISON:  None. FINDINGS: There is no evidence of hip fracture or dislocation. There is no evidence of arthropathy or other focal bone abnormality. Cage fusion L5-S1. IMPRESSION: Negative. Electronically Signed   By: Marlan Palau M.D.   On: 02/01/2021 13:51    Procedures Procedures   Medications Ordered in ED Medications  methylPREDNISolone sodium succinate (SOLU-MEDROL) 125 mg/2 mL injection 125 mg (has no administration in time range)  doxycycline (VIBRA-TABS) tablet 100 mg (has no administration in time range)  diltiazem (CARDIZEM CD) 24 hr capsule 180 mg (has no administration in time range)  ipratropium-albuterol (DUONEB) 0.5-2.5 (3) MG/3ML nebulizer solution 3 mL (has no administration in time range)  HYDROcodone-acetaminophen (NORCO/VICODIN) 5-325 MG per tablet 1 tablet (1 tablet Oral Given 02/01/21 1509)    ED Course  I have reviewed the triage vital signs and the nursing notes.  Pertinent labs & imaging  results that were available during my care of the patient were reviewed by me and considered in my medical decision making (see chart for details).    MDM Rules/Calculators/A&P                         75 year old female presents to the ED for chest pain, shortness of breath and arthralgias. Upon arrival, patient exhibits increased work of breathing.  Vital signs are stable.  Exam  shows tachypneic woman, wheezes on exam without acute rales or rhonchi.  Heart irregular, but rate controlled.  Abdomen soft and nontender.  No specific joint swelling or redness.  Exhibits full range of motion of knee and hip.  No focal neurologic deficits present.  Differential includes: COPD exacerbation, CHF exacerbation, ACS, PE, pneumothorax, pneumonia, pleural effusion  Position most consistent with a COPD exacerbation due to wheezing, increased work of breathing, and cough.  DuoNebs, Solu-Medrol, and doxycycline ordered.  Less likely PE, or pneumothorax.  Will order chest x-ray to evaluate for pneumonia.  Less likely CHF exacerbation without significant peripheral edema. D-dimer ordered to evaluate for need of PE study.   CT head ordered due to fall on blood thinner in triage, though low suspicion for acute traumatic injury.  Troponin initially not elevated.CBC without leukocytosis.  Metabolic panel unremarkable.  BNP less than previous.  CT head with no acute bleed or stroke.  Plain films of hip and knee without acute fracture or dislocation.  Chest x-ray without focal pneumonia or overt fluid consolidation.  Overall, labs and picture do point COPD exacerbation.  ABG was ordered due to poor ability to get pulse ox.  Patient does require oxygen at this time, though does have it at home.    D-dimer and second troponin pending at time of handoff.  Plan is to reevaluate after DuoNeb's and COPD medication delivered.  If feeling better, can be discharged for COPD exacerbation management.  If D-dimer is elevated,  will need further imaging for possible PE.   Final Clinical Impression(s) / ED Diagnoses Final diagnoses:  None    Rx / DC Orders ED Discharge Orders    None       Louretta Parma, DO 02/01/21 1548    Gerhard Munch, MD 02/06/21 [phone]

## 2021-02-01 NOTE — ED Notes (Signed)
Unable to obtain pt Spo2

## 2021-02-01 NOTE — Plan of Care (Signed)

## 2021-02-01 NOTE — Consult Note (Addendum)
Cardiology Consultation:   Patient ID: Sherri Hart MRN: [id number]; DOB: 08/12/1946  Admit date: 02/01/2021 Date of Consult: 02/01/2021  PCP:  Patient, No Pcp Per (Inactive)   [facility] Providers Cardiologist:  New, followed in [facility]   Patient Profile:   Sherri Hart is a 75 y.o. female with a hx of Hart, copd, chronic diasotlic who is being seen 02/01/2021 for the evaluation of Hart with RVR at the request of Dr.Anwar  History of Present Illness:   Sherri Hart 75 yo female history of Hart, COPD on home O2, chronic diastolic HF Presented with SOB and DOE, mechanial fall at home. Missed some home doses of her diltiazem x 2 days. Also reports productive cough x 3 days, wheezing on exam in ER. In ER patient found to be in Hart with RVR, cardiology consulted.  In ER received IV dilt 20mg  x 1, oral 24hr dilt 180mg  x 1  ER vitals 180/73 HR 97 84% RA WBC 7.1 Hgb 14.9 Plt 184 K 4.4 Cr 1.03 BUN 19 BNP 258 ABG: 7.4/37/69/22  Trop 10-->12 COVID neg ddimer neg EKG Hart with RVR  CXR: no acute process CT head: no acute process  06/2020 echo LVEF 55-60%,indet dd, mild RV dysfunction, PASP 37, severe BAE  Past Medical History:  Diagnosis Date  . Atrial fibrillation (HCC)   . Congestive heart failure (HCC)   . COPD (chronic obstructive pulmonary disease) (HCC)     Past Surgical History:  Procedure Laterality Date  . BACK SURGERY    . CHOLECYSTECTOMY    . NECK SURGERY    . REPLACEMENT TOTAL KNEE Right   . TUBAL LIGATION        Inpatient Medications: Scheduled Meds: . apixaban  5 mg Oral BID  . [START ON 02/02/2021] azithromycin  500 mg Oral Daily  . diclofenac Sodium  2 g Topical QID  . [START ON 02/02/2021] diltiazem  240 mg Oral Daily  . furosemide  40 mg Oral Daily  . ipratropium  0.5 mg Nebulization Q6H  . methylPREDNISolone (SOLU-MEDROL) injection  40 mg Intravenous Q12H   Followed by  . [START ON 02/02/2021] predniSONE  40 mg Oral Q breakfast  .  nicotine  14 mg Transdermal Daily   Continuous Infusions: . azithromycin     PRN Meds: albuterol, HYDROcodone-acetaminophen, loratadine, morphine injection  Allergies:    Allergies  Allergen Reactions  . Penicillins     Did it involve swelling of the face/tongue/throat, SOB, or low BP? Yes Did it involve sudden or severe rash/hives, skin peeling, or any reaction on the inside of your mouth or nose? N Did you need to seek medical attention at a hospital or doctor's office? Y When did it last happen?childhood If all above answers are "NO", may proceed with cephalosporin use.     Social History:   Social History   Socioeconomic History  . Marital status: Single    Spouse name: Not on file  . Number of children: Not on file  . Years of education: Not on file  . Highest education level: Not on file  Occupational History  . Not on file  Tobacco Use  . Smoking status: Current Every Day Smoker    Packs/day: 0.50    Types: Cigarettes  . Smokeless tobacco: Never Used  Substance and Sexual Activity  . Alcohol use: Never  . Drug use: Never  . Sexual activity: Not on file  Other Topics Concern  . Not on file  Social  History Narrative  . Not on file   Social Determinants of Health   Financial Resource Strain: Not on file  Food Insecurity: Not on file  Transportation Needs: No Transportation Needs  . Lack of Transportation (Medical): No  . Lack of Transportation (Non-Medical): No  Physical Activity: Not on file  Stress: Not on file  Social Connections: Not on file  Intimate Partner Violence: Not on file    Family History:    Family History  Problem Relation Age of Onset  . COPD Other      ROS:  Please see the history of present illness.   All other ROS reviewed and negative.     Physical Exam/Data:   Vitals:   02/01/21 1715 02/01/21 1745 02/01/21 1800 02/01/21 1915  BP: 124/90 (!) 143/110 (!) 154/127 126/72  Pulse: 96 (!) 125 (!) 128 (!) 103  Resp: 17  (!) 30 (!) 25 (!) 24  Temp:      TempSrc:      SpO2: 97% 92% 92% 93%   No intake or output data in the 24 hours ending 02/01/21 1948 Last 3 Weights 10/04/2020 07/07/2020 07/06/2020  Weight (lbs) 195 lb 196 lb 3.4 oz 208 lb 1.8 oz  Weight (kg) 88.451 kg 89 kg 94.4 kg     There is no height or weight on file to calculate BMI.  General:  Well nourished, well developed, in no acute distress HEENT: normal Lymph: no adenopathy Neck: no JVD Endocrine:  No thryomegaly Vascular: No carotid bruits; FA pulses 2+ bilaterally without bruits  Cardiac:  irreg Lungs:  bilatearal wheezing Abd: soft, nontender, no hepatomegaly  Ext: no edema Musculoskeletal:  No deformities, BUE and BLE strength normal and equal Skin: warm and dry  Neuro:  CNs 2-12 intact, no focal abnormalities noted Psych:  Normal affect     Laboratory Data:  High Sensitivity Troponin:   Recent Labs  Lab 02/01/21 1255 02/01/21 1455  TROPONINIHS 10 12     Chemistry Recent Labs  Lab 02/01/21 1255 02/01/21 1422  NA 139 140  K 4.4 4.1  CL 107  --   CO2 25  --   GLUCOSE 99  --   BUN 19  --   CREATININE 1.03*  --   CALCIUM 9.4  --   GFRNONAA 58*  --   ANIONGAP 7  --     Recent Labs  Lab 02/01/21 1255  PROT 6.9  ALBUMIN 3.8  AST 13*  ALT 9  ALKPHOS 67  BILITOT 0.9   Hematology Recent Labs  Lab 02/01/21 1255 02/01/21 1422  WBC 7.1  --   RBC 4.51  --   HGB 14.9 14.3  HCT 46.3* 42.0  MCV 102.7*  --   MCH 33.0  --   MCHC 32.2  --   RDW 13.5  --   PLT 184  --    BNP Recent Labs  Lab 02/01/21 1255  BNP 258.1*    DDimer  Recent Labs  Lab 02/01/21 1348  DDIMER 0.38     Radiology/Studies:  DG Chest 2 View  Result Date: 02/01/2021 CLINICAL DATA:  Fall 4 days ago EXAM: CHEST - 2 VIEW COMPARISON:  10/03/2020 FINDINGS: Heart size upper normal. Atherosclerotic calcification aortic arch. Negative for heart failure. Mild atelectasis or scarring in the lingula unchanged from prior studies. No  acute infiltrate or effusion. IMPRESSION: No active cardiopulmonary disease. Electronically Signed   By: Marlan Palau M.D.   On: 02/01/2021 13:53  CT Head Wo Contrast  Result Date: 02/01/2021 CLINICAL DATA:  Head trauma. Recent fall. History of atrial fibrillation. EXAM: CT HEAD WITHOUT CONTRAST TECHNIQUE: Contiguous axial images were obtained from the base of the skull through the vertex without intravenous contrast. COMPARISON:  None. FINDINGS: Brain: There is no evidence of an acute infarct, intracranial hemorrhage, mass, midline shift, or extra-axial fluid collection. Mild cerebral atrophy is within normal limits for age. Hypodensities in the cerebral white matter bilaterally are nonspecific but compatible with mild chronic small vessel ischemic disease. Vascular: Calcified atherosclerosis at the skull base. No hyperdense vessel. Skull: No fracture or suspicious osseous lesion. Sinuses/Orbits: Mild posterior right ethmoid air cell opacification. Clear mastoid air cells. Unremarkable orbits. Other: None. IMPRESSION: 1. No evidence of acute intracranial abnormality. 2. Mild chronic small vessel ischemic disease. Electronically Signed   By: Sebastian Ache M.D.   On: 02/01/2021 14:29   DG Knee Complete 4 Views Right  Result Date: 02/01/2021 CLINICAL DATA:  Fall EXAM: RIGHT KNEE - COMPLETE 4+ VIEW COMPARISON:  None. 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 No prosthetic loosening. There is a small joint effusion. Negative for fracture. IMPRESSION: Negative for fracture.  Right knee replacement Electronically Signed   By: Marlan Palau M.D.   On: 02/01/2021 13:52   DG Hip Unilat W or Wo Pelvis 2-3 Views Right  Result Date: 02/01/2021 CLINICAL DATA:  Fall 4 days ago.  Pain EXAM: DG HIP (WITH OR WITHOUT PELVIS) 2-3V RIGHT COMPARISON:  None. FINDINGS: There is no evidence of hip fracture or dislocation. There is no evidence of arthropathy or other focal bone abnormality. Cage fusion L5-S1.  IMPRESSION: Negative. Electronically Signed   By: Marlan Palau M.D.   On: 02/01/2021 13:51     Assessment and Plan:   1. Hart - hospital notes mention paroxysmal Hart, however all EKGs in our system show Hart. 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 Would pursue just rate control at this time.  - Hart with RVR likely exacerbated by missed doses of diltiazem, ongoing pain from recent fall, and COPD exacerbation, hypoxia - rates already down to low 100s after IV dilt bolus, oral dilt in ER. Would start oral dilt short acting 60mg  every 6 hours with hold parameters, can consolidated later in admission. Likely as systemic issues resolve HRs will also improve.  - continue eliquis.    2. COPD exacerbation - on home O2  - received IV steroids, oral abx, nebs in ER.   3. Chronic diasotlic HF - does not appear significnatly ovelroaded, would resume her home oral regimen tomorrow - does not need repeat echo  4. Atypical chest pain - cramping pain left lower chest worst with coughing, tender to palpation - no plans for ischemic testing.     For questions or updates, please contact [facility] Please consult [email or web address] for contact info under    Signed, , MD  02/01/2021 7:48 PM

## 2021-02-01 NOTE — ED Provider Notes (Signed)
Emergency Medicine Provider Triage Evaluation Note  Sherri Hart , a 75 y.o. female  was evaluated in triage.  Pt complains of palpitations that started 3 days ago. She further reports chest pain and sob that started a few days ago as well. She has missed a few days of her afib medication. Has not missed any doses of eliquis.   She further had a fall a few days ago. She reports head trauma, right knee and right hip pain.   Review of Systems  Positive: Chest pain, sob, palpitations, right knee/right hip Negative: nv  Physical Exam  BP (!) 180/73 (BP Location: Right Arm)   Pulse 97   Temp 98.3 F (36.8 C) (Oral)   Resp 18  Gen:   Awake, no distress   Resp:  tachypneic MSK:   Moves extremities without difficulty  Other:  Irregularly irregular rhythm, trace ble edema  Medical Decision Making  Medically screening exam initiated at 12:44 PM.  Appropriate orders placed.  Sherri Hart was informed that the remainder of the evaluation will be completed by another provider, this initial triage assessment does not replace that evaluation, and the importance of remaining in the ED until their evaluation is complete.  Nursing made aware pt needs to be prioritized for a room    Rayne Du 02/01/21 1256    Gerhard Munch, MD 02/01/21 1539

## 2021-02-01 NOTE — ED Provider Notes (Signed)
  Physical Exam  BP 127/76   Pulse 92   Temp 98.4 F (36.9 C) (Oral)   Resp (!) 23   Ht 5\' 7"  (1.702 m)   Wt 106.4 kg   SpO2 95%   BMI 36.74 kg/m   Physical Exam Constitutional:      General: She is not in acute distress. Eyes:     Conjunctiva/sclera: Conjunctivae normal.     Pupils: Pupils are equal, round, and reactive to light.  Cardiovascular:     Rate and Rhythm: Tachycardia present. Rhythm irregular.  Pulmonary:     Effort: No respiratory distress.     Breath sounds: No wheezing.     Comments: tachypnea Abdominal:     General: There is no distension.     Tenderness: There is no abdominal tenderness.  Neurological:     General: No focal deficit present.     Mental Status: She is alert.     ED Course/Procedures     Procedures  MDM  Patient presents with COPD exacerbation and chest pain. -Tachypneic, wheezing -Chest pain related to difficulty with breathing -Initial Tn negative -ECG nonischemic -X-raying hip knee chest; fall on Saturday (negative) -CT head d/t fall on blood thinners (negative)  To Do: -Second Troponin -Re-eval after breathing treatment   Plan: -Discharge on doxycycline if improved -Pt has O2 at home and uses PRN   Course: On bedside evaluation patient had tachycardia 120s to 140s.  She had continued anterior chest pain that she did not feel improved.  Her wheezing was gone and she felt her breathing was improved although she was persistently tachypneic.  Saturating well on room air.  Second troponin is unremarkable.  Given her continued chest pain as well as development of RVR plan is for admission to the hospital.  Ordered IV diltiazem and discussed with hospitalist who agrees.     Saturday, MD 02/01/21 02/03/21    [id number], MD 02/06/21 [phone]

## 2021-02-01 NOTE — ED Triage Notes (Signed)
Pt has hx of afib, has recently missed a few doses of her cardizem. Having chest pain, sob and palpitations. Pt has hx of knee pain, had a fall on Saturday when her knee gave out and reports hitting her head.

## 2021-02-02 DIAGNOSIS — M25551 Pain in right hip: Secondary | ICD-10-CM | POA: Diagnosis present

## 2021-02-02 DIAGNOSIS — J9621 Acute and chronic respiratory failure with hypoxia: Secondary | ICD-10-CM | POA: Diagnosis present

## 2021-02-02 DIAGNOSIS — M25561 Pain in right knee: Secondary | ICD-10-CM | POA: Diagnosis present

## 2021-02-02 DIAGNOSIS — R0602 Shortness of breath: Secondary | ICD-10-CM | POA: Diagnosis present

## 2021-02-02 DIAGNOSIS — Z599 Problem related to housing and economic circumstances, unspecified: Secondary | ICD-10-CM | POA: Diagnosis not present

## 2021-02-02 DIAGNOSIS — Z825 Family history of asthma and other chronic lower respiratory diseases: Secondary | ICD-10-CM | POA: Diagnosis not present

## 2021-02-02 DIAGNOSIS — Z6836 Body mass index (BMI) 36.0-36.9, adult: Secondary | ICD-10-CM | POA: Diagnosis not present

## 2021-02-02 DIAGNOSIS — Z20822 Contact with and (suspected) exposure to covid-19: Secondary | ICD-10-CM | POA: Diagnosis present

## 2021-02-02 DIAGNOSIS — Z79899 Other long term (current) drug therapy: Secondary | ICD-10-CM | POA: Diagnosis not present

## 2021-02-02 DIAGNOSIS — E669 Obesity, unspecified: Secondary | ICD-10-CM | POA: Diagnosis present

## 2021-02-02 DIAGNOSIS — J441 Chronic obstructive pulmonary disease with (acute) exacerbation: Principal | ICD-10-CM

## 2021-02-02 DIAGNOSIS — Z7951 Long term (current) use of inhaled steroids: Secondary | ICD-10-CM | POA: Diagnosis not present

## 2021-02-02 DIAGNOSIS — Z7901 Long term (current) use of anticoagulants: Secondary | ICD-10-CM | POA: Diagnosis not present

## 2021-02-02 DIAGNOSIS — I4821 Permanent atrial fibrillation: Secondary | ICD-10-CM | POA: Diagnosis present

## 2021-02-02 DIAGNOSIS — F17210 Nicotine dependence, cigarettes, uncomplicated: Secondary | ICD-10-CM

## 2021-02-02 DIAGNOSIS — Z9114 Patient's other noncompliance with medication regimen: Secondary | ICD-10-CM | POA: Diagnosis not present

## 2021-02-02 DIAGNOSIS — I5032 Chronic diastolic (congestive) heart failure: Secondary | ICD-10-CM

## 2021-02-02 DIAGNOSIS — Z880 Allergy status to penicillin: Secondary | ICD-10-CM | POA: Diagnosis not present

## 2021-02-02 DIAGNOSIS — Z9981 Dependence on supplemental oxygen: Secondary | ICD-10-CM | POA: Diagnosis not present

## 2021-02-02 DIAGNOSIS — I5033 Acute on chronic diastolic (congestive) heart failure: Secondary | ICD-10-CM | POA: Diagnosis present

## 2021-02-02 DIAGNOSIS — G8929 Other chronic pain: Secondary | ICD-10-CM | POA: Diagnosis present

## 2021-02-02 DIAGNOSIS — Z9851 Tubal ligation status: Secondary | ICD-10-CM | POA: Diagnosis not present

## 2021-02-02 DIAGNOSIS — I4891 Unspecified atrial fibrillation: Secondary | ICD-10-CM | POA: Diagnosis not present

## 2021-02-02 LAB — MRSA PCR SCREENING: MRSA by PCR: NEGATIVE

## 2021-02-02 LAB — HIV ANTIBODY (ROUTINE TESTING W REFLEX): HIV Screen 4th Generation wRfx: NONREACTIVE

## 2021-02-02 MED ORDER — LEVALBUTEROL HCL 0.63 MG/3ML IN NEBU: 0.6300 mg | INHALATION_SOLUTION | Freq: Four times a day (QID) | RESPIRATORY_TRACT | Status: DC | PRN

## 2021-02-02 MED ORDER — IPRATROPIUM BROMIDE 0.02 % IN SOLN
0.5000 mg | Freq: Four times a day (QID) | RESPIRATORY_TRACT | Status: DC | PRN
Start: 2021-02-02 — End: 2021-02-04

## 2021-02-02 MED ORDER — LEVALBUTEROL HCL 0.63 MG/3ML IN NEBU: 0.6300 mg | INHALATION_SOLUTION | Freq: Four times a day (QID) | RESPIRATORY_TRACT | Status: DC

## 2021-02-02 NOTE — Progress Notes (Signed)
PROGRESS NOTE  Sherri Hart Block NWG:[id number] DOB: 11-Jan-1946   PCP: Patient, No Pcp Per (Inactive)  Patient is from: Home  DOA: 02/01/2021 LOS: 0  Chief complaints: Chest pain, shortness of breath, wheeze  Brief Narrative / Interim history: 75 year old F with PMH of persistent A. 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 fib with RVR,  COPD exacerbation and atypical chest pain.  Cardiology consulted.  RVR improved with IV Cardizem push, and she was transitioned to p.o. Cardizem.  She was also started on systemic steroid, antibiotics and nebulizers for COPD exacerbation.  Subjective: Seen and examined earlier this morning.  No major events overnight or this morning.  Continues to endorse shortness of breath and productive cough with whitish phlegm.  She also reports chest pressure and tightness, and some pressure in her left arm. Per RN, she desaturated to low 80s on 2 L just sitting up on bed, and an oxygen flow increased to 3 L.  She denies GI or UTI symptoms.   Objective: Vitals:   02/02/21 0300 02/02/21 0729 02/02/21 0800 02/02/21 0817  BP: 101/73 (!) 103/53 121/75   Pulse: 91 74 82 86  Resp: (!) 21 13 19 20   Temp: 97.8 F (36.6 C)     TempSrc: Oral     SpO2: 93% 90% 93% 92%  Weight:      Height:        Intake/Output Summary (Last 24 hours) at 02/02/2021 1056 Last data filed at 02/01/2021 2113 Gross per 24 hour  Intake --  Output 400 ml  Net -400 ml   Filed Weights   02/01/21 2113  Weight: 106.4 kg    Examination:  GENERAL: No apparent distress.  Nontoxic. HEENT: MMM.  Vision and hearing grossly intact.  NECK: Supple.  No apparent JVD.  RESP: 88 to 94% on 3 L.  Notable work of breathing as she speaks.  Diminished aeration bilaterally. CVS: Irregular rhythm.  Normal rate.  Heart sounds normal.  ABD/GI/GU: BS+.  Abd soft, NTND.  MSK/EXT:  Moves extremities. No apparent deformity. No edema.  SKIN: no apparent skin lesion or wound NEURO: Awake, alert and oriented appropriately.  No apparent focal neuro deficit. PSYCH: Calm. Normal affect.   Procedures:  None  Microbiology summarized: COVID-19 and influenza PCR nonreactive.  Assessment & Plan: Acute on chronic hypoxic respiratory failure due to COPD exacerbation: She is on 2 L at baseline.  Likely from ongoing smoking.  Per patient's nurse, she desaturated to low 80s on 2 L just sitting up in bed. She desaturated to 87% on 3 L just talking to me. Still with shortness of breath, cough and diminished aeration.   -Continue IV Solu-Medrol and azithromycin -Continue ipratropium every 6 hours -Add Xopenex every 6 hours -Encourage tobacco cessation -Incentive spirometry -Wean oxygen as able -She at least needs LAMA/LABA on discharge.  Persistent A. fib with RVR: Rate controlled on p.o. Cardizem now. -Cardiology managing-continue p.o. Cardizem and Eliquis  Chronic diastolic CHF: Appears euvolemic on exam. -Continue p.o. Lasix 40 mg daily -Monitor fluid status  Atypical chest pain-reproducible to palpation.  Could be partly due to A. fib and COPD as well.  Low suspicion for ACS. -Manage A. fib and COPD as above  Fall at home -PT/OT eval  Tobacco use disorder -Encouraged cessation. -Nicotine patch  Class I obesity Body  mass index is 36.74 kg/m.         DVT prophylaxis:   apixaban (ELIQUIS) tablet 5 mg  Code Status: Full code Family Communication: Patient and/or RN. Available if any question.  Level of care: Telemetry Cardiac Status is: Observation  The patient will require care spanning > 2 midnights and should be moved to inpatient because: IV treatments appropriate due to intensity of illness or inability to take PO and Inpatient level of care appropriate due to severity of illness  Dispo: The patient is from: Home               Anticipated d/c is to: Home              Patient currently is not medically stable to d/c.   Difficult to place patient No       Consultants:  Cardiology   Sch Meds:  Scheduled Meds: . apixaban  5 mg Oral BID  . azithromycin  500 mg Oral Daily  . diclofenac Sodium  2 g Topical QID  . diltiazem  60 mg Oral Q6H  . furosemide  40 mg Oral Daily  . ipratropium  0.5 mg Nebulization Q6H  . levalbuterol  0.63 mg Nebulization Q6H  . methylPREDNISolone (SOLU-MEDROL) injection  40 mg Intravenous Q12H   Followed by  . [START ON 02/03/2021] predniSONE  40 mg Oral Q breakfast  . nicotine  14 mg Transdermal Daily   Continuous Infusions: PRN Meds:.HYDROcodone-acetaminophen, loratadine, metoprolol tartrate, morphine injection  Antimicrobials: Anti-infectives (From admission, onward)   Start     Dose/Rate Route Frequency Ordered Stop   02/02/21 2000  azithromycin (ZITHROMAX) tablet 500 mg       "Followed by" Linked Group Details   500 mg Oral Daily 02/01/21 1936 02/06/21 0959   02/01/21 2000  azithromycin (ZITHROMAX) 500 mg in sodium chloride 0.9 % 250 mL IVPB       "Followed by" Linked Group Details   500 mg 250 mL/hr over 60 Minutes Intravenous Every 24 hours 02/01/21 1936 02/01/21 2323   02/01/21 1515  doxycycline (VIBRA-TABS) tablet 100 mg        100 mg Oral  Once 02/01/21 1503 02/01/21 1551       I have personally reviewed the following labs and images: CBC: Recent Labs  Lab 02/01/21 1255 02/01/21 1422  WBC 7.1  --   NEUTROABS 4.4  --   HGB 14.9 14.3  HCT 46.3* 42.0  MCV 102.7*  --   PLT 184  --    BMP &GFR Recent Labs  Lab 02/01/21 1255 02/01/21 1422  NA 139 140  K 4.4 4.1  CL 107  --   CO2 25  --   GLUCOSE 99  --   BUN 19  --   CREATININE 1.03*  --   CALCIUM 9.4  --    Estimated Creatinine Clearance: 62 mL/min (A) (by C-G formula based on SCr of 1.03 mg/dL (H)). Liver & Pancreas: Recent Labs  Lab 02/01/21 1255  AST 13*  ALT 9  ALKPHOS 67  BILITOT  0.9  PROT 6.9  ALBUMIN 3.8   No results for input(s): LIPASE, AMYLASE in the last 168 hours. No results for input(s): AMMONIA in the last 168 hours. Diabetic: No results for input(s): HGBA1C in the last 72 hours. No results for input(s): GLUCAP in the last 168 hours. Cardiac Enzymes: No results for input(s): CKTOTAL, CKMB, CKMBINDEX, TROPONINI in the last 168 hours. No results for input(s): PROBNP in  the last 8760 hours. Coagulation Profile: No results for input(s): INR, PROTIME in the last 168 hours. Thyroid Function Tests: No results for input(s): TSH, T4TOTAL, FREET4, T3FREE, THYROIDAB in the last 72 hours. Lipid Profile: No results for input(s): CHOL, HDL, LDLCALC, TRIG, CHOLHDL, LDLDIRECT in the last 72 hours. Anemia Panel: No results for input(s): VITAMINB12, FOLATE, FERRITIN, TIBC, IRON, RETICCTPCT in the last 72 hours. Urine analysis: No results found for: COLORURINE, APPEARANCEUR, LABSPEC, PHURINE, GLUCOSEU, HGBUR, BILIRUBINUR, KETONESUR, PROTEINUR, UROBILINOGEN, NITRITE, LEUKOCYTESUR Sepsis Labs: Invalid input(s): PROCALCITONIN, LACTICIDVEN  Microbiology: Recent Results (from the past 240 hour(s))  Resp Panel by RT-PCR (Flu A&B, Covid) Nasopharyngeal Swab     Status: None   Collection Time: 02/01/21  1:47 PM   Specimen: Nasopharyngeal Swab; Nasopharyngeal(NP) swabs in vial transport medium  Result Value Ref Range Status   SARS Coronavirus 2 by RT PCR NEGATIVE NEGATIVE Final    Comment: (NOTE) SARS-CoV-2 target nucleic acids are NOT DETECTED.  The SARS-CoV-2 RNA is generally detectable in upper respiratory specimens during the acute phase of infection. The lowest concentration of SARS-CoV-2 viral copies this assay can detect is 138 copies/mL. A negative result does not preclude SARS-Cov-2 infection and should not be used as the sole basis for treatment or other patient management decisions. A negative result may occur with  improper specimen collection/handling,  submission of specimen other than nasopharyngeal swab, presence of viral mutation(s) within the areas targeted by this assay, and inadequate number of viral copies(<138 copies/mL). A negative result must be combined with clinical observations, patient history, and epidemiological information. The expected result is Negative.  Fact Sheet for Patients:  BloggerCourse.com  Fact Sheet for Healthcare Providers:  SeriousBroker.it  This test is no t yet approved or cleared by the [place] FDA and  has been authorized for detection and/or diagnosis of SARS-CoV-2 by FDA under an Emergency Use Authorization (EUA). This EUA will remain  in effect (meaning this test can be used) for the duration of the COVID-19 declaration under Section 564(b)(1) of the Act, 21 U.S.C.section 360bbb-3(b)(1), unless the authorization is terminated  or revoked sooner.       Influenza A by PCR NEGATIVE NEGATIVE Final   Influenza B by PCR NEGATIVE NEGATIVE Final    Comment: (NOTE) The Xpert Xpress SARS-CoV-2/FLU/RSV plus assay is intended as an aid in the diagnosis of influenza from Nasopharyngeal swab specimens and should not be used as a sole basis for treatment. Nasal washings and aspirates are unacceptable for Xpert Xpress SARS-CoV-2/FLU/RSV testing.  Fact Sheet for Patients: BloggerCourse.com  Fact Sheet for Healthcare Providers: SeriousBroker.it  This test is not yet approved or cleared by the [place] FDA and has been authorized for detection and/or diagnosis of SARS-CoV-2 by FDA under an Emergency Use Authorization (EUA). This EUA will remain in effect (meaning this test can be used) for the duration of the COVID-19 declaration under Section 564(b)(1) of the Act, 21 U.S.C. section 360bbb-3(b)(1), unless the authorization is terminated or revoked.  Performed at [facility], [address] [place], [place] [address]   MRSA PCR Screening     Status: None   Collection Time: 02/01/21  9:25 PM   Specimen: Nasopharyngeal  Result Value Ref Range Status   MRSA by PCR NEGATIVE NEGATIVE Final    Comment:        The GeneXpert MRSA Assay (FDA approved for NASAL specimens only), is one component of a comprehensive MRSA colonization surveillance program. It is not intended to  diagnose MRSA infection nor to guide or monitor treatment for MRSA infections. Performed at [facility], [address] [place], [place] [address]     Radiology Studies: DG Chest 2 View  Result Date: 02/01/2021 CLINICAL DATA:  Fall 4 days ago EXAM: CHEST - 2 VIEW COMPARISON:  10/03/2020 FINDINGS: Heart size upper normal. Atherosclerotic calcification aortic arch. Negative for heart failure. Mild atelectasis or scarring in the lingula unchanged from prior studies. No acute infiltrate or effusion. IMPRESSION: No active cardiopulmonary disease. Electronically Signed   By: Marlan Palau M.D.   On: 02/01/2021 13:53   CT Head Wo Contrast  Result Date: 02/01/2021 CLINICAL DATA:  Head trauma. Recent fall. History of atrial fibrillation. EXAM: CT HEAD WITHOUT CONTRAST TECHNIQUE: Contiguous axial images were obtained from the base of the skull through the vertex without intravenous contrast. COMPARISON:  None. FINDINGS: Brain: There is no evidence of an acute infarct, intracranial hemorrhage, mass, midline shift, or extra-axial fluid collection. Mild cerebral atrophy is within normal limits for age. Hypodensities in the cerebral white matter bilaterally are nonspecific but compatible with mild chronic small vessel ischemic disease. Vascular: Calcified atherosclerosis at the skull base. No hyperdense vessel. Skull: No fracture or suspicious osseous lesion. Sinuses/Orbits: Mild posterior right ethmoid air cell opacification. Clear mastoid air cells. Unremarkable orbits. Other: None. IMPRESSION: 1. No evidence  of acute intracranial abnormality. 2. Mild chronic small vessel ischemic disease. Electronically Signed   By: Sebastian Ache M.D.   On: 02/01/2021 14:29   DG Knee Complete 4 Views Right  Result Date: 02/01/2021 CLINICAL DATA:  Fall EXAM: RIGHT KNEE - COMPLETE 4+ VIEW COMPARISON:  None. 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 No prosthetic loosening. There is a small joint effusion. Negative for fracture. IMPRESSION: Negative for fracture.  Right knee replacement Electronically Signed   By: Marlan Palau M.D.   On: 02/01/2021 13:52   DG Hip Unilat W or Wo Pelvis 2-3 Views Right  Result Date: 02/01/2021 CLINICAL DATA:  Fall 4 days ago.  Pain EXAM: DG HIP (WITH OR WITHOUT PELVIS) 2-3V RIGHT COMPARISON:  None. FINDINGS: There is no evidence of hip fracture or dislocation. There is no evidence of arthropathy or other focal bone abnormality. Cage fusion L5-S1. IMPRESSION: Negative. Electronically Signed   By: Marlan Palau M.D.   On: 02/01/2021 13:51      Lavayah Vita T. Shanekqua Schaper [facility]  If 7PM-7AM, please contact night-coverage [email or web address] 02/02/2021, 10:56 AM

## 2021-02-02 NOTE — Evaluation (Signed)
Occupational Therapy Evaluation Patient Details Name: Sherri Hart MRN: [id number] DOB: [birth year] Today's Date: 02/02/2021    History of Present Illness 75 yo admitted 5/26 with SOB and DOE with mild COPD exacerbation and AFib with RVR. Pt also with fall and RLE pain with xray negative. PMhx: COPD on home O2, Afib, CHF, Rt TKA   Clinical Impression   PTA patient reports independent with ADLs, mobility using cane.  Reports using rollator since fall last Saturday, R knee pain.  Admitted for above and presenting with problem list below, including impaired balance, decreased activity tolerance, R knee pain.  Pt on 3L supplemental O2 during session, SpO2 ranged from 80s-95% but poor waveform and no SOB by patient.  Completing transfers, mobility and ADLs with up to supervision for safety.  Cueing for RW mgmt.  Believe she will benefit from further OT services while admitted to optimize independence and tolerance for ADLs and mobility but anticipate no further needs after dc home. Next session to focus on fall prevention, energy conservation and tub transfers.      Follow Up Recommendations  No OT follow up;Supervision - Intermittent    Equipment Recommendations  None recommended by OT    Recommendations for Other Services       Precautions / Restrictions Precautions Precautions: Fall Restrictions Weight Bearing Restrictions: No      Mobility Bed Mobility Overal bed mobility: Modified Independent             General bed mobility comments: HOB elevated, no assist required    Transfers Overall transfer level: Needs assistance Equipment used: Rolling walker (2 wheeled) Transfers: Sit to/from Stand Sit to Stand: Supervision         General transfer comment: cueing for hand placement, supervision for safety    Balance Overall balance assessment: Needs assistance Sitting-balance support: No upper extremity supported;Feet supported Sitting balance-Leahy Scale: Good      Standing balance support: Bilateral upper extremity supported;During functional activity;No upper extremity supported Standing balance-Leahy Scale: Poor Standing balance comment: relies on BUE support dynamically                           ADL either performed or assessed with clinical judgement   ADL Overall ADL's : Needs assistance/impaired     Grooming: Supervision/safety;Standing       Lower Body Bathing: Supervison/ safety;Sit to/from stand   Upper Body Dressing : Set up;Sitting   Lower Body Dressing: Supervision/safety;Sit to/from stand Lower Body Dressing Details (indicate cue type and reason): increased effort to don R sock due to pain but no assist  required Toilet Transfer: Supervision/safety;Ambulation;RW   Toileting- Clothing Manipulation and Hygiene: Supervision/safety;Sit to/from stand       Functional mobility during ADLs: Supervision/safety;Rolling walker;Cueing for safety General ADL Comments: pt limited by R knee pain, decreased activity tolerance     Vision         Perception     Praxis      Pertinent Vitals/Pain Pain Assessment: Faces Faces Pain Scale: Hurts even more Pain Location: R knee Pain Descriptors / Indicators: Discomfort;Grimacing;Guarding Pain Intervention(s): Limited activity within patient's tolerance;Monitored during session;Repositioned     Hand Dominance Right   Extremity/Trunk Assessment Upper Extremity Assessment Upper Extremity Assessment: Overall WFL for tasks assessed   Lower Extremity Assessment Lower Extremity Assessment: Defer to PT evaluation       Communication Communication Communication: No difficulties   Cognition Arousal/Alertness: Awake/alert Behavior During Therapy: [facility] for  tasks assessed/performed Overall Cognitive Status: Within Functional Limits for tasks assessed                                     General Comments  pt on 3L supplemental O2, no SOB noted but difficulty  to obtain accurate pleuth (SpO2 ranged from 80s-95%)    Exercises     Shoulder Instructions      Home Living Family/patient expects to be discharged to:: Private residence Living Arrangements: Other (Comment) (grandchildren (4, 6, 8)) Available Help at Discharge: Family;Available 24 hours/day Type of Home: Apartment Home Access: Stairs to enter Entrance Stairs-Number of Steps: 5   Home Layout: One level     Bathroom Shower/Tub: Chief Strategy Officer: Standard     Home Equipment: Cane - single point;Walker - 4 wheels;Shower seat;Grab bars - tub/shower;Grab bars - toilet          Prior Functioning/Environment Level of Independence: Independent with assistive device(s)        Comments: walks with cane and rollator on occasion. Lives with daughter and grandchildren (4, 6yo). Moved from [place] after daughter was murdered to care for grandkids        OT Problem List: Impaired balance (sitting and/or standing);Decreased activity tolerance;Decreased knowledge of use of DME or AE;Decreased knowledge of precautions;Pain;Decreased safety awareness      OT Treatment/Interventions: Self-care/ADL training;DME and/or AE instruction;Energy conservation;Therapeutic activities;Patient/family education;Balance training    OT Goals(Current goals can be found in the care plan section) Acute Rehab OT Goals Patient Stated Goal: home OT Goal Formulation: With patient Time For Goal Achievement: 02/16/21 Potential to Achieve Goals: Good  OT Frequency: Min 2X/week   Barriers to D/C:            Co-evaluation PT/OT/SLP Co-Evaluation/Treatment: Yes Reason for Co-Treatment: For patient/therapist safety;To address functional/ADL transfers   OT goals addressed during session: ADL's and self-care      AM-PAC OT "6 Clicks" Daily Activity     Outcome Measure Help from another person eating meals?: None Help from another person taking care of personal grooming?: A  Little Help from another person toileting, which includes using toliet, bedpan, or urinal?: A Little Help from another person bathing (including washing, rinsing, drying)?: A Little Help from another person to put on and taking off regular upper body clothing?: None Help from another person to put on and taking off regular lower body clothing?: A Little 6 Click Score: 20   End of Session Equipment Utilized During Treatment: Gait belt;Rolling walker;Oxygen Nurse Communication: Mobility status  Activity Tolerance: Patient tolerated treatment well Patient left: in chair;with call bell/phone within reach;with chair alarm set  OT Visit Diagnosis: Other abnormalities of gait and mobility (R26.89);Muscle weakness (generalized) (M62.81);Pain Pain - Right/Left: Right Pain - part of body: Knee                Time: 1011-1037 OT Time Calculation (min): 26 min Charges:  OT General Charges $OT Visit: 1 Visit OT Evaluation $OT Eval Low Complexity: 1 Low  Barry Brunner, OT Acute Rehabilitation Services Pager [phone] Office [phone]   Chancy Milroy 02/02/2021, 11:30 AM

## 2021-02-02 NOTE — Plan of Care (Signed)
  Problem: Education: Goal: Knowledge of General Education information will improve Description: Including pain rating scale, medication(s)/side effects and non-pharmacologic comfort measures Outcome: Progressing   Problem: Clinical Measurements: Goal: Ability to maintain clinical measurements within normal limits will improve Outcome: Progressing Goal: Will remain free from infection Outcome: Progressing   Problem: Nutrition: Goal: Adequate nutrition will be maintained Outcome: Progressing   Problem: Coping: Goal: Level of anxiety will decrease Outcome: Progressing   Problem: Elimination: Goal: Will not experience complications related to bowel motility Outcome: Progressing Goal: Will not experience complications related to urinary retention Outcome: Progressing   Problem: Pain Managment: Goal: General experience of comfort will improve Outcome: Progressing   Problem: Safety: Goal: Ability to remain free from injury will improve Outcome: Progressing   Problem: Skin Integrity: Goal: Risk for impaired skin integrity will decrease Outcome: Progressing

## 2021-02-02 NOTE — Evaluation (Signed)
Physical Therapy Evaluation Patient Details Name: Sherri Hart MRN: [id number] DOB: 1945-12-03 Today's Date: 02/02/2021   History of Present Illness  75 yo admitted 5/26 with SOB and DOE with mild COPD exacerbation and AFib with RVR. Pt also with fall and RLE pain with xray negative. PMhx: COPD on home O2, Afib, CHF, Rt TKA  Clinical Impression  Pt pleasant and reports she lives with family in an apartment and normally walks with a cane but at times rollator. Pt with continued Rt knee pain and states she never had any orthopedic followup after TKA in [place] 20years ago. Pt with limited gait tolerance at baseline only walking at home and riding carts if she goes in community. Pt with decreased transfers and gait with education for walking program and need for pulse oximeter for home use as pt states she wears O2 intermittently and has no way of assessing saturation. Today difficulty with obtaining accurate pleth despite new probe and sats ranging 77-96% on 3L with activity. Pt will benefit from acute therapy to maximize mobility and function to improve quality of life.      Follow Up Recommendations No PT follow up    Equipment Recommendations  None recommended by PT    Recommendations for Other Services       Precautions / Restrictions Precautions Precautions: Fall Restrictions Weight Bearing Restrictions: No      Mobility  Bed Mobility Overal bed mobility: Modified Independent             General bed mobility comments: HOB elevated, no assist required    Transfers Overall transfer level: Needs assistance Equipment used: Rolling walker (2 wheeled) Transfers: Sit to/from Stand Sit to Stand: Supervision         General transfer comment: cueing for hand placement, supervision for safety  Ambulation/Gait Ambulation/Gait assistance: Supervision Gait Distance (Feet): 150 Feet Assistive device: Rolling walker (2 wheeled) Gait Pattern/deviations: Step-through  pattern;Decreased stride length;Trunk flexed   Gait velocity interpretation: 1.31 - 2.62 ft/sec, indicative of limited community ambulator General Gait Details: cues for posture and proximity to RW, pt able to self regulate distance  Stairs            Wheelchair Mobility    Modified Rankin (Stroke Patients Only)       Balance Overall balance assessment: Needs assistance Sitting-balance support: No upper extremity supported;Feet supported Sitting balance-Leahy Scale: Good Sitting balance - Comments: EOb at toilet   Standing balance support: Bilateral upper extremity supported;During functional activity Standing balance-Leahy Scale: Poor Standing balance comment: relies on BUE support dynamically                             Pertinent Vitals/Pain Pain Assessment: Faces Faces Pain Scale: Hurts even more Pain Location: R knee Pain Descriptors / Indicators: Discomfort;Grimacing;Guarding Pain Intervention(s): Limited activity within patient's tolerance;Monitored during session;Repositioned    Home Living Family/patient expects to be discharged to:: Private residence Living Arrangements: Other (Comment) (grandchildren (4, 6, 25)) Available Help at Discharge: Family;Available 24 hours/day Type of Home: Apartment Home Access: Stairs to enter   Entrance Stairs-Number of Steps: 5 Home Layout: One level Home Equipment: Cane - single point;Walker - 4 wheels;Shower seat;Grab bars - tub/shower;Grab bars - toilet      Prior Function Level of Independence: Independent with assistive device(s)         Comments: walks with cane and rollator on occasion. Lives with daughter and grandchildren (4, 6yo). Moved from  [place] after daughter was murdered to care for grandkids     [employer]   Dominant Hand: Right    Extremity/Trunk Assessment   Upper Extremity Assessment Upper Extremity Assessment: Defer to OT evaluation    Lower Extremity Assessment Lower  Extremity Assessment: Generalized weakness    Cervical / Trunk Assessment Cervical / Trunk Assessment: Kyphotic  Communication   Communication: No difficulties  Cognition Arousal/Alertness: Awake/alert Behavior During Therapy: WFL for tasks assessed/performed Overall Cognitive Status: Within Functional Limits for tasks assessed                                        General Comments     Exercises     Assessment/Plan    PT Assessment Patient needs continued PT services  PT Problem List Decreased strength;Decreased mobility;Decreased activity tolerance;Decreased balance;Decreased knowledge of use of DME;Cardiopulmonary status limiting activity       PT Treatment Interventions Gait training;Therapeutic activities;Patient/family education;Functional mobility training;Balance training;DME instruction;Therapeutic exercise    PT Goals (Current goals can be found in the Care Plan section)  Acute Rehab PT Goals Patient Stated Goal: return home PT Goal Formulation: With patient Time For Goal Achievement: 02/09/21 Potential to Achieve Goals: Good    Frequency Min 3X/week   Barriers to discharge        Co-evaluation   Reason for Co-Treatment: For patient/therapist safety;To address functional/ADL transfers   OT goals addressed during session: ADL's and self-care       AM-PAC PT "6 Clicks" Mobility  Outcome Measure Help needed turning from your back to your side while in a flat bed without using bedrails?: A Little Help needed moving from lying on your back to sitting on the side of a flat bed without using bedrails?: A Little Help needed moving to and from a bed to a chair (including a wheelchair)?: A Little Help needed standing up from a chair using your arms (e.g., wheelchair or bedside chair)?: A Little Help needed to walk in hospital room?: A Little Help needed climbing 3-5 steps with a railing? : A Little 6 Click Score: 18    End of Session  Equipment Utilized During Treatment: Gait belt;Oxygen Activity Tolerance: Patient tolerated treatment well Patient left: in chair;with call bell/phone within reach;with chair alarm set Nurse Communication: Mobility status PT Visit Diagnosis: Other abnormalities of gait and mobility (R26.89);Difficulty in walking, not elsewhere classified (R26.2);Muscle weakness (generalized) (M62.81)    Time: 6294-7654 PT Time Calculation (min) (ACUTE ONLY): 24 min   Charges:   PT Evaluation $PT Eval Moderate Complexity: 1 Mod          Shaul Trautman P, PT Acute Rehabilitation Services Pager: [phone] Office: [phone]   Enedina Finner Blakely Maranan 02/02/2021, 11:52 AM

## 2021-02-02 NOTE — Progress Notes (Signed)
DAILY PROGRESS NOTE   Patient Name: Sherri Hart Block Date of Encounter: 02/02/2021 Cardiologist: None  Chief Complaint    No complaints  Patient Profile   Sherri Hart is a 75 y.o. female with a hx of afib, copd, chronic diasotlic who is being seen 02/01/2021 for the evaluation of afib with RVR at the request of Dr.Anwar  Subjective   Afib rates are well-controlled today, now on q6 hr diltiazem. Had some upper mid-epigastric pain and that has resolved. Troponins negative.  Objective   Vitals:   02/02/21 0300 02/02/21 0729 02/02/21 0800 02/02/21 0817  BP: 101/73 (!) 103/53 121/75   Pulse: 91 74 82 86  Resp: (!) 21 13 19 20   Temp: 97.8 F (36.6 C)     TempSrc: Oral     SpO2: 93% 90% 93% 92%  Weight:      Height:        Intake/Output Summary (Last 24 hours) at 02/02/2021 9924 Last data filed at 02/01/2021 2113 Gross per 24 hour  Intake --  Output 400 ml  Net -400 ml   Filed Weights   02/01/21 2113  Weight: 106.4 kg    Physical Exam   General appearance: alert and no distress Neck: no carotid bruit, no JVD and thyroid not enlarged, symmetric, no tenderness/mass/nodules Lungs: clear to auscultation bilaterally Heart: irregularly irregular rhythm Abdomen: soft, non-tender; bowel sounds normal; no masses,  no organomegaly Extremities: extremities normal, atraumatic, no cyanosis or edema Pulses: 2+ and symmetric Skin: Skin color, texture, turgor normal. No rashes or lesions Neurologic: Grossly normal Psyc: Pleasant  Inpatient Medications    Scheduled Meds: . apixaban  5 mg Oral BID  . azithromycin  500 mg Oral Daily  . diclofenac Sodium  2 g Topical QID  . diltiazem  60 mg Oral Q6H  . furosemide  40 mg Oral Daily  . ipratropium  0.5 mg Nebulization Q6H  . levalbuterol  0.63 mg Nebulization Q6H  . methylPREDNISolone (SOLU-MEDROL) injection  40 mg Intravenous Q12H   Followed by  . [START ON 02/03/2021] predniSONE  40 mg Oral Q breakfast  . nicotine   14 mg Transdermal Daily    Continuous Infusions:   PRN Meds: HYDROcodone-acetaminophen, loratadine, metoprolol tartrate, morphine injection   Labs   Results for orders placed or performed during the hospital encounter of 02/01/21 (from the past 48 hour(s))  CBC with Differential     Status: Abnormal   Collection Time: 02/01/21 12:55 PM  Result Value Ref Range   WBC 7.1 4.0 - 10.5 K/uL   RBC 4.51 3.87 - 5.11 MIL/uL   Hemoglobin 14.9 12.0 - 15.0 g/dL   HCT 26.8 (H) 34.1 - 96.2 %   MCV 102.7 (H) 80.0 - 100.0 fL   MCH 33.0 26.0 - 34.0 pg   MCHC 32.2 30.0 - 36.0 g/dL   RDW 22.9 79.8 - 92.1 %   Platelets 184 150 - 400 K/uL   nRBC 0.0 0.0 - 0.2 %   Neutrophils Relative % 63 %   Neutro Abs 4.4 1.7 - 7.7 K/uL   Lymphocytes Relative 26 %   Lymphs Abs 1.8 0.7 - 4.0 K/uL   Monocytes Relative 8 %   Monocytes Absolute 0.6 0.1 - 1.0 K/uL   Eosinophils Relative 3 %   Eosinophils Absolute 0.2 0.0 - 0.5 K/uL   Basophils Relative 0 %   Basophils Absolute 0.0 0.0 - 0.1 K/uL   Immature Granulocytes 0 %   Abs Immature Granulocytes  0.03 0.00 - 0.07 K/uL    Comment: Performed at [facility], [address], [place], [place] [address]  Comprehensive metabolic panel     Status: Abnormal   Collection Time: 02/01/21 12:55 PM  Result Value Ref Range   Sodium 139 135 - 145 mmol/L   Potassium 4.4 3.5 - 5.1 mmol/L   Chloride 107 98 - 111 mmol/L   CO2 25 22 - 32 mmol/L   Glucose, Bld 99 70 - 99 mg/dL    Comment: Glucose reference range applies only to samples taken after fasting for at least 8 hours.   BUN 19 8 - 23 mg/dL   Creatinine, Ser 3.23 (H) 0.44 - 1.00 mg/dL   Calcium 9.4 8.9 - 55.7 mg/dL   Total Protein 6.9 6.5 - 8.1 g/dL   Albumin 3.8 3.5 - 5.0 g/dL   AST 13 (L) 15 - 41 U/L   ALT 9 0 - 44 U/L   Alkaline Phosphatase 67 38 - 126 U/L   Total Bilirubin 0.9 0.3 - 1.2 mg/dL   GFR, Estimated 58 (L) >60 mL/min    Comment: (NOTE) Calculated using the CKD-EPI Creatinine Equation  (2021)    Anion gap 7 5 - 15    Comment: Performed at [facility], [address], [place], [place] [address]  Troponin I (High Sensitivity)     Status: None   Collection Time: 02/01/21 12:55 PM  Result Value Ref Range   Troponin I (High Sensitivity) 10 <18 ng/L    Comment: (NOTE) Elevated high sensitivity troponin I (hsTnI) values and significant  changes across serial measurements may suggest ACS but many other  chronic and acute conditions are known to elevate hsTnI results.  Refer to the "Links" section for chest pain algorithms and additional  guidance. Performed at [facility], [address], [place], [place] [address]   Brain natriuretic peptide     Status: Abnormal   Collection Time: 02/01/21 12:55 PM  Result Value Ref Range   B Natriuretic Peptide 258.1 (H) 0.0 - 100.0 pg/mL    Comment: Performed at [facility], [address], [place], [place] [address]  Resp Panel by RT-PCR (Flu A&B, Covid) Nasopharyngeal Swab     Status: None   Collection Time: 02/01/21  1:47 PM   Specimen: Nasopharyngeal Swab; Nasopharyngeal(NP) swabs in vial transport medium  Result Value Ref Range   SARS Coronavirus 2 by RT PCR NEGATIVE NEGATIVE    Comment: (NOTE) SARS-CoV-2 target nucleic acids are NOT DETECTED.  The SARS-CoV-2 RNA is generally detectable in upper respiratory specimens during the acute phase of infection. The lowest concentration of SARS-CoV-2 viral copies this assay can detect is 138 copies/mL. A negative result does not preclude SARS-Cov-2 infection and should not be used as the sole basis for treatment or other patient management decisions. A negative result may occur with  improper specimen collection/handling, submission of specimen other than nasopharyngeal swab, presence of viral mutation(s) within the areas targeted by this assay, and inadequate number of viral copies(<138 copies/mL). A negative result must be combined with clinical observations,  patient history, and epidemiological information. The expected result is Negative.  Fact Sheet for Patients:  BloggerCourse.com  Fact Sheet for Healthcare Providers:  SeriousBroker.it  This test is no t yet approved or cleared by the [place] FDA and  has been authorized for detection and/or diagnosis of SARS-CoV-2 by FDA under an Emergency Use Authorization (EUA). This EUA will remain  in effect (meaning this test can be used) for the duration of the COVID-19 declaration under Section 564(b)(1) of the Act, 21 U.S.C.section 360bbb-3(b)(1), unless the authorization is terminated  or revoked sooner.       Influenza A by PCR NEGATIVE NEGATIVE   Influenza B by PCR NEGATIVE NEGATIVE    Comment: (NOTE) The Xpert Xpress SARS-CoV-2/FLU/RSV plus assay is intended as an aid in the diagnosis of influenza from Nasopharyngeal swab specimens and should not be used as a sole basis for treatment. Nasal washings and aspirates are unacceptable for Xpert Xpress SARS-CoV-2/FLU/RSV testing.  Fact Sheet for Patients: BloggerCourse.com  Fact Sheet for Healthcare Providers: SeriousBroker.it  This test is not yet approved or cleared by the [place] FDA and has been authorized for detection and/or diagnosis of SARS-CoV-2 by FDA under an Emergency Use Authorization (EUA). This EUA will remain in effect (meaning this test can be used) for the duration of the COVID-19 declaration under Section 564(b)(1) of the Act, 21 U.S.C. section 360bbb-3(b)(1), unless the authorization is terminated or revoked.  Performed at [facility], [address] [place], [place] [address]   D-dimer, quantitative     Status: None   Collection Time: 02/01/21  1:48 PM  Result Value Ref Range   D-Dimer, Quant 0.38 0.00 - 0.50 ug/mL-FEU    Comment: (NOTE) At the manufacturer cut-off value of 0.5 g/mL FEU,  this assay has a negative predictive value of 95-100%.This assay is intended for use in conjunction with a clinical pretest probability (PTP) assessment model to exclude pulmonary embolism (PE) and deep venous thrombosis (DVT) in outpatients suspected of PE or DVT. Results should be correlated with clinical presentation. Performed at [facility], [address], [place], [place] [address]   I-Stat arterial blood gas, [facility] ED)     Status: Abnormal   Collection Time: 02/01/21  2:22 PM  Result Value Ref Range   pH, Arterial 7.400 7.350 - 7.450   pCO2 arterial 36.7 32.0 - 48.0 mmHg   pO2, Arterial 69 (L) 83.0 - 108.0 mmHg   Bicarbonate 22.7 20.0 - 28.0 mmol/L   TCO2 24 22 - 32 mmol/L   O2 Saturation 94.0 %   Acid-base deficit 2.0 0.0 - 2.0 mmol/L   Sodium 140 135 - 145 mmol/L   Potassium 4.1 3.5 - 5.1 mmol/L   Calcium, Ion 1.28 1.15 - 1.40 mmol/L   HCT 42.0 36.0 - 46.0 %   Hemoglobin 14.3 12.0 - 15.0 g/dL   Collection site Radial    Drawn by RT    Sample type ARTERIAL   Troponin I (High Sensitivity)     Status: None   Collection Time: 02/01/21  2:55 PM  Result Value Ref Range   Troponin I (High Sensitivity) 12 <18 ng/L    Comment: (NOTE) Elevated high sensitivity troponin I (hsTnI) values and significant  changes across serial measurements may suggest ACS but many other  chronic and acute conditions are known to elevate hsTnI results.  Refer to the "Links" section for chest pain algorithms and additional  guidance. Performed at [facility], [address] [place], [place] [address]   MRSA PCR Screening     Status: None   Collection Time: 02/01/21  9:25 PM   Specimen: Nasopharyngeal  Result Value Ref Range   MRSA by PCR NEGATIVE NEGATIVE    Comment:        The GeneXpert MRSA Assay (FDA approved for NASAL specimens only), is  one component of a comprehensive MRSA colonization surveillance program. It is not intended to diagnose MRSA infection nor to guide  or monitor treatment for MRSA infections. Performed at [facility], [address], [place], [place] [address]   HIV Antibody (routine testing w rflx)     Status: None   Collection Time: 02/02/21 12:45 AM  Result Value Ref Range   HIV Screen 4th Generation wRfx Non Reactive Non Reactive    Comment: Performed at [facility], [address] [place], [place] [address]    ECG   N/A  Telemetry   Afib with CVR - Personally Reviewed  Radiology    DG Chest 2 View  Result Date: 02/01/2021 CLINICAL DATA:  Fall 4 days ago EXAM: CHEST - 2 VIEW COMPARISON:  10/03/2020 FINDINGS: Heart size upper normal. Atherosclerotic calcification aortic arch. Negative for heart failure. Mild atelectasis or scarring in the lingula unchanged from prior studies. No acute infiltrate or effusion. IMPRESSION: No active cardiopulmonary disease. Electronically Signed   By: Marlan Palau M.D.   On: 02/01/2021 13:53   CT Head Wo Contrast  Result Date: 02/01/2021 CLINICAL DATA:  Head trauma. Recent fall. History of atrial fibrillation. EXAM: CT HEAD WITHOUT CONTRAST TECHNIQUE: Contiguous axial images were obtained from the base of the skull through the vertex without intravenous contrast. COMPARISON:  None. FINDINGS: Brain: There is no evidence of an acute infarct, intracranial hemorrhage, mass, midline shift, or extra-axial fluid collection. Mild cerebral atrophy is within normal limits for age. Hypodensities in the cerebral white matter bilaterally are nonspecific but compatible with mild chronic small vessel ischemic disease. Vascular: Calcified atherosclerosis at the skull base. No hyperdense vessel. Skull: No fracture or suspicious osseous lesion. Sinuses/Orbits: Mild posterior right ethmoid air cell opacification. Clear mastoid air cells. Unremarkable orbits. Other: None. IMPRESSION: 1. No evidence of acute intracranial abnormality. 2. Mild chronic small vessel ischemic disease. Electronically Signed    By: Sebastian Ache M.D.   On: 02/01/2021 14:29   DG Knee Complete 4 Views Right  Result Date: 02/01/2021 CLINICAL DATA:  Fall EXAM: RIGHT KNEE - COMPLETE 4+ VIEW COMPARISON:  None. 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 No prosthetic loosening. There is a small joint effusion. Negative for fracture. IMPRESSION: Negative for fracture.  Right knee replacement Electronically Signed   By: Marlan Palau M.D.   On: 02/01/2021 13:52   DG Hip Unilat W or Wo Pelvis 2-3 Views Right  Result Date: 02/01/2021 CLINICAL DATA:  Fall 4 days ago.  Pain EXAM: DG HIP (WITH OR WITHOUT PELVIS) 2-3V RIGHT COMPARISON:  None. FINDINGS: There is no evidence of hip fracture or dislocation. There is no evidence of arthropathy or other focal bone abnormality. Cage fusion L5-S1. IMPRESSION: Negative. Electronically Signed   By: Marlan Palau M.D.   On: 02/01/2021 13:51    Cardiac Studies   N/A  Assessment   1. Active Problems: 2.   Atrial fibrillation with rapid ventricular response (HCC) 3.   COPD with acute exacerbation (HCC) 4.   Nicotine dependence, cigarettes, uncomplicated 5.   Atrial fibrillation with RVR (HCC) 6.   Plan   1. AFib is currently rate-controlled - this is considered persistent, if not longstanding persistent afib - agree with rate-control strategy and anticoagulation. Currently on cardizem 60 mg q6 hrs - would continue today and look to consolidated to the cardizem LA 240 mg tablet tomorrow.  Time Spent Directly with Patient:  I have spent a total of 25 minutes  with the patient reviewing hospital notes, telemetry, EKGs, labs and examining the patient as well as establishing an assessment and plan that was discussed personally with the patient.  > 50% of time was spent in direct patient care.  Length of Stay:  LOS: 0 days   Chrystie NoseKenneth C. Leonia Heatherly, MD, [facility] HospitalFACC, FACP  [place]  [facility]  Medical Director of the [facility] Lipid [facility] &  Cardiovascular Risk Reduction  Clinic Diplomate of the American Board of Clinical Lipidology Attending Cardiologist  Direct Dial: [phone]  Fax: [phone]  Website:  [email or web address]  Ricarda Atayde C Juliocesar Blasius 02/02/2021, 9:06 AM

## 2021-02-03 DIAGNOSIS — R0602 Shortness of breath: Secondary | ICD-10-CM | POA: Diagnosis not present

## 2021-02-03 DIAGNOSIS — I4891 Unspecified atrial fibrillation: Secondary | ICD-10-CM | POA: Diagnosis not present

## 2021-02-03 DIAGNOSIS — J9621 Acute and chronic respiratory failure with hypoxia: Secondary | ICD-10-CM | POA: Diagnosis not present

## 2021-02-03 DIAGNOSIS — J441 Chronic obstructive pulmonary disease with (acute) exacerbation: Secondary | ICD-10-CM | POA: Diagnosis not present

## 2021-02-03 MED ORDER — METHYLPREDNISOLONE SODIUM SUCC 125 MG IJ SOLR
60.0000 mg | Freq: Two times a day (BID) | INTRAMUSCULAR | Status: DC
  Administered 2021-02-03 – 2021-02-04 (×3): 60 mg via INTRAVENOUS
  Filled 2021-02-03 (×3): qty 2

## 2021-02-03 MED ORDER — DILTIAZEM HCL ER COATED BEADS 180 MG PO CP24
180.0000 mg | ORAL_CAPSULE | Freq: Every day | ORAL | Status: DC
  Administered 2021-02-03 – 2021-02-04 (×2): 180 mg via ORAL
  Filled 2021-02-03 (×2): qty 1

## 2021-02-03 NOTE — Progress Notes (Signed)
Patient is on 2L oxygen chronic at home, desats to 86% on 3L walking 20 feet, required 4L oxygen, recovers well with rest

## 2021-02-03 NOTE — Progress Notes (Signed)
Occupational Therapy Treatment Patient Details Name: Sherri Hart MRN: [id number] DOB: 05-Nov-1945 Today's Date: 02/03/2021    History of present illness 75 yo admitted 5/26 with SOB and DOE with mild COPD exacerbation and AFib with RVR. Pt also with fall and RLE pain with xray negative. PMhx: COPD on home O2, Afib, CHF, Rt TKA   OT comments  Pt progressing well towards OT goals. Session focused on assessing tub/shower transfer abilities with pt able to return demo at min guard, as well as verbally indicating each sequencing step during task at home. Educated pt that she can safely wear oxygen during showering tasks, as long as mindful of avoiding getting water in O2 line. Educated pt on fall prevention strategies to maximize safety at home with pt demo good safety awareness of potential hazards. Provided handout on energy conservation strategies with pt already implementing many techniques during ADLs/IADLs. Encouraged pt to obtain pulse oximeter to safely monitor O2 levels at home. Pt appreciative of education/handouts.  Received on 2 L O2, 98% at rest. Brief desat to 85% (variable pleth) with tub transfers, but recovers quickly to 90% via seated rest break. 1/4 DOE.    Follow Up Recommendations  No OT follow up;Supervision - Intermittent    Equipment Recommendations  None recommended by OT    Recommendations for Other Services      Precautions / Restrictions Precautions Precautions: Fall;Other (comment) Precaution Comments: monitor O2 Restrictions Weight Bearing Restrictions: No       Mobility Bed Mobility Overal bed mobility: Modified Independent             General bed mobility comments: HOB elevated, no assist required    Transfers Overall transfer level: Needs assistance Equipment used: Rolling walker (2 wheeled) Transfers: Sit to/from Stand Sit to Stand: Modified independent (Device/Increase time)              Balance Overall balance assessment: Needs  assistance Sitting-balance support: No upper extremity supported;Feet supported Sitting balance-Leahy Scale: Good     Standing balance support: Bilateral upper extremity supported;Single extremity supported;During functional activity Standing balance-Leahy Scale: Poor Standing balance comment: reliant on at least one UE support dynamically                           ADL either performed or assessed with clinical judgement   ADL Overall ADL's : Needs assistance/impaired                     Lower Body Dressing: Modified independent;Sit to/from stand Lower Body Dressing Details (indicate cue type and reason): able to independently bring feet to self for pulling up socks sitting EOB         Tub/ Shower Transfer: Min guard;Tub transfer;Grab bars Tub/Shower Transfer Details (indicate cue type and reason): simulated tub transfer with pt demo ability to verbally recall step by step sequencing for task at home with use of grab bars. No difficulty in lifting LEs for task. Pt reports always having family with her for tub transfers due to fear of falling   General ADL Comments: Provided fall prevention and energy conservation handout. Pt able to verbalize many safety strategies that she already uses to prevent falls (including O2 line mgmt, fall mat in tub, family present with tub transfers, etc). Pt also implements many energy conservation strategies during ADLs/IADLs at home (sitting for kitchen tasks, use of shower chair, rest breaks, window open for air flow at home). Educated  pt on ability to wear O2 during showering tasks, to avoid spraying directly on face/preventing water from getting in O2 line     Vision   Vision Assessment?: No apparent visual deficits   Perception     Praxis      Cognition Arousal/Alertness: Awake/alert Behavior During Therapy: WFL for tasks assessed/performed Overall Cognitive Status: Within Functional Limits for tasks assessed                                           Exercises     Shoulder Instructions       General Comments Received on 2 L O2, pt reports unaware of what her true baseline O2 levels should be (reports MD at clinic did not tell her specific number level). SpO2 high 90s at rest on 2 L O2. questionable desat to 85% with tub transfer practice and mild SOB but quickly recovers with seated rest break    Pertinent Vitals/ Pain       Pain Assessment: Faces Faces Pain Scale: Hurts little more Pain Location: back Pain Descriptors / Indicators: Discomfort;Grimacing;Guarding Pain Intervention(s): Repositioned;Patient requesting pain meds-RN notified  Home Living                                          Prior Functioning/Environment              Frequency  Min 2X/week        Progress Toward Goals  OT Goals(current goals can now be found in the care plan section)  Progress towards OT goals: Progressing toward goals  Acute Rehab OT Goals Patient Stated Goal: return home OT Goal Formulation: With patient Time For Goal Achievement: 02/16/21 Potential to Achieve Goals: Good ADL Goals Pt Will Perform Lower Body Dressing: with modified independence;sit to/from stand Pt Will Perform Tub/Shower Transfer: Tub transfer;with modified independence;ambulating;shower seat;grab bars;rolling walker Additional ADL Goal #1: Pt will verbalize 3 fall prevention and energy conservation techniques to utilize during daily routine to optimize safety and indpeendence.  Plan Discharge plan remains appropriate    Co-evaluation                 AM-PAC OT "6 Clicks" Daily Activity     Outcome Measure   Help from another person eating meals?: None Help from another person taking care of personal grooming?: None Help from another person toileting, which includes using toliet, bedpan, or urinal?: A Little Help from another person bathing (including washing, rinsing, drying)?: A  Little Help from another person to put on and taking off regular upper body clothing?: None Help from another person to put on and taking off regular lower body clothing?: None 6 Click Score: 22    End of Session Equipment Utilized During Treatment: Rolling walker;Gait belt;Oxygen  OT Visit Diagnosis: Other abnormalities of gait and mobility (R26.89);Muscle weakness (generalized) (M62.81);Pain Pain - part of body:  (back)   Activity Tolerance Patient tolerated treatment well   Patient Left in bed;with call bell/phone within reach   Nurse Communication Mobility status;Patient requests pain meds;Other (comment) (O2)        Time: 4010-2725 OT Time Calculation (min): 35 min  Charges: OT General Charges $OT Visit: 1 Visit OT Treatments $Self Care/Home Management : 8-22 mins $Therapeutic Activity: 8-22 mins  Raynelle Fanning  B, OTR/L Acute Rehab Services Office: [phone]   Lorre Munroe 02/03/2021, 1:06 PM

## 2021-02-03 NOTE — Progress Notes (Signed)
PROGRESS NOTE  Pennye Macario Block OIT:[id number] DOB: 05-05-1946   PCP: Patient, No Pcp Per (Inactive)  Patient is from: Home  DOA: 02/01/2021 LOS: 1  Chief complaints: Chest pain, shortness of breath, wheeze  Brief Narrative / Interim history: 75 year old F with PMH of persistent A. 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 fib with RVR,  COPD exacerbation and atypical chest pain.  Cardiology consulted.  RVR improved with IV Cardizem push, and she was transitioned to p.o. Cardizem.  She was also started on systemic steroid, antibiotics and nebulizers for COPD exacerbation.  Subjective: Seen and examined earlier this morning.  No major events overnight of this morning.  Reports improvement in her breathing.  No further chest pressure or palpitation.  However, patient desaturated to 70s with ambulation on home 2 L twice this morning.  Objective: Vitals:   02/03/21 0633 02/03/21 0720 02/03/21 1000 02/03/21 1145  BP: 130/82 135/80  (!) 146/79  Pulse:  86  85  Resp:  18  17  Temp:  98 F (36.7 C)  98.4 F (36.9 C)  TempSrc:  Oral  Oral  SpO2:  93% 99% 93%  Weight:      Height:        Intake/Output Summary (Last 24 hours) at 02/03/2021 1211 Last data filed at 02/03/2021 0800 Gross per 24 hour  Intake 720 ml  Output 2250 ml  Net -1530 ml   Filed Weights   02/01/21 2113  Weight: 106.4 kg    Examination:  GENERAL: No apparent distress.  Nontoxic. HEENT: MMM.  Vision and hearing grossly intact.  NECK: Supple.  No apparent JVD.  RESP: 93% on 3 L at rest.  No IWOB.  Diminished aeration over lower lung fields. CVS:  RRR. Heart sounds normal.  ABD/GI/GU: BS+. Abd soft, NTND.  MSK/EXT:  Moves extremities. No apparent deformity. No edema.  SKIN: no apparent skin lesion or wound NEURO: Awake and alert. Oriented appropriately.   No apparent focal neuro deficit. PSYCH: Calm. Normal affect.  Procedures:  None  Microbiology summarized: COVID-19 and influenza PCR nonreactive.  Assessment & Plan: Acute on chronic hypoxic respiratory failure due to COPD exacerbation: She is on 2 L that she uses intermittently.  Likely from ongoing smoking.  Reports subjective improvement in her breathing but desaturated to 70s with ambulation on home 2 L.  Still with diminished aeration over lower lung fields. -Increase IV Solu-Medrol to 60 mg twice daily -Continue azithromycin,  -Continue Xopenex and ipratropium every 6 hours -Encouraged tobacco cessation -Encourage incentive spirometry -Wean oxygen as able -She at least needs LAMA/LABA on discharge.  Persistent A. fib with RVR: Rate controlled.  CHA2DS2-VASc score greater than 3 -Transition to Cardizem CD by cardiology -Continue Eliquis for anticoagulation  Chronic diastolic CHF: Appears euvolemic on exam.  About 2.5 L UOP/24 hours.  Renal function stable. -Continue p.o. Lasix 40 mg daily -Monitor fluid status  Atypical chest pain-reproducible to palpation.  Could be partly due to A. fib and COPD as well.  Low suspicion for ACS.  Resolved. -Manage A. fib and COPD as above  Fall at home -PT/OT eval-no need identified.  Tobacco use disorder: Reports cutting down to half a pack a day recently -Encouraged cessation. -Nicotine patch  Class I obesity Body mass index is 36.74 kg/m.  DVT prophylaxis:   apixaban (ELIQUIS) tablet 5 mg  Code Status: Full code Family Communication: Patient and/or RN. Available if any question.  Level of care: Telemetry Cardiac Status is: Inpatient  Remains inpatient appropriate because:IV treatments appropriate due to intensity of illness or inability to take PO, Inpatient level of care appropriate due to severity of illness and Still with significant desaturation with ambulation on home 2L   Dispo: The patient is from: Home               Anticipated d/c is to: Home              Patient currently is not medically stable to d/c.   Difficult to place patient No            Consultants:  Cardiology-signed off   Sch Meds:  Scheduled Meds: . apixaban  5 mg Oral BID  . azithromycin  500 mg Oral Daily  . diclofenac Sodium  2 g Topical QID  . diltiazem  180 mg Oral Daily  . furosemide  40 mg Oral Daily  . methylPREDNISolone (SOLU-MEDROL) injection  60 mg Intravenous Q12H  . nicotine  14 mg Transdermal Daily   Continuous Infusions: PRN Meds:.HYDROcodone-acetaminophen, ipratropium, levalbuterol, loratadine, metoprolol tartrate, morphine injection  Antimicrobials: Anti-infectives (From admission, onward)   Start     Dose/Rate Route Frequency Ordered Stop   02/02/21 2000  azithromycin (ZITHROMAX) tablet 500 mg       "Followed by" Linked Group Details   500 mg Oral Daily 02/01/21 1936 02/06/21 0959   02/01/21 2000  azithromycin (ZITHROMAX) 500 mg in sodium chloride 0.9 % 250 mL IVPB       "Followed by" Linked Group Details   500 mg 250 mL/hr over 60 Minutes Intravenous Every 24 hours 02/01/21 1936 02/01/21 2323   02/01/21 1515  doxycycline (VIBRA-TABS) tablet 100 mg        100 mg Oral  Once 02/01/21 1503 02/01/21 1551       I have personally reviewed the following labs and images: CBC: Recent Labs  Lab 02/01/21 1255 02/01/21 1422  WBC 7.1  --   NEUTROABS 4.4  --   HGB 14.9 14.3  HCT 46.3* 42.0  MCV 102.7*  --   PLT 184  --    BMP &GFR Recent Labs  Lab 02/01/21 1255 02/01/21 1422  NA 139 140  K 4.4 4.1  CL 107  --   CO2 25  --   GLUCOSE 99  --   BUN 19  --   CREATININE 1.03*  --   CALCIUM 9.4  --    Estimated Creatinine Clearance: 62 mL/min (A) (by C-G formula based on SCr of 1.03 mg/dL (H)). Liver & Pancreas: Recent Labs  Lab 02/01/21 1255  AST 13*  ALT 9  ALKPHOS 67  BILITOT 0.9  PROT 6.9  ALBUMIN 3.8   No results for input(s): LIPASE, AMYLASE in the last 168  hours. No results for input(s): AMMONIA in the last 168 hours. Diabetic: No results for input(s): HGBA1C in the last 72 hours. No results for input(s): GLUCAP in the last 168 hours. Cardiac Enzymes: No results for input(s): CKTOTAL, CKMB, CKMBINDEX, TROPONINI in the last 168 hours. No results for input(s): PROBNP in the last 8760 hours. Coagulation Profile: No results for input(s): INR, PROTIME in the last 168 hours. Thyroid Function Tests: No results for input(s): TSH, T4TOTAL, FREET4, T3FREE, THYROIDAB in the last 72 hours. Lipid Profile: No results for input(s):  CHOL, HDL, LDLCALC, TRIG, CHOLHDL, LDLDIRECT in the last 72 hours. Anemia Panel: No results for input(s): VITAMINB12, FOLATE, FERRITIN, TIBC, IRON, RETICCTPCT in the last 72 hours. Urine analysis: No results found for: COLORURINE, APPEARANCEUR, LABSPEC, PHURINE, GLUCOSEU, HGBUR, BILIRUBINUR, KETONESUR, PROTEINUR, UROBILINOGEN, NITRITE, LEUKOCYTESUR Sepsis Labs: Invalid input(s): PROCALCITONIN, LACTICIDVEN  Microbiology: Recent Results (from the past 240 hour(s))  Resp Panel by RT-PCR (Flu A&B, Covid) Nasopharyngeal Swab     Status: None   Collection Time: 02/01/21  1:47 PM   Specimen: Nasopharyngeal Swab; Nasopharyngeal(NP) swabs in vial transport medium  Result Value Ref Range Status   SARS Coronavirus 2 by RT PCR NEGATIVE NEGATIVE Final    Comment: (NOTE) SARS-CoV-2 target nucleic acids are NOT DETECTED.  The SARS-CoV-2 RNA is generally detectable in upper respiratory specimens during the acute phase of infection. The lowest concentration of SARS-CoV-2 viral copies this assay can detect is 138 copies/mL. A negative result does not preclude SARS-Cov-2 infection and should not be used as the sole basis for treatment or other patient management decisions. A negative result may occur with  improper specimen collection/handling, submission of specimen other than nasopharyngeal swab, presence of viral mutation(s) within  the areas targeted by this assay, and inadequate number of viral copies(<138 copies/mL). A negative result must be combined with clinical observations, patient history, and epidemiological information. The expected result is Negative.  Fact Sheet for Patients:  BloggerCourse.com  Fact Sheet for Healthcare Providers:  SeriousBroker.it  This test is no t yet approved or cleared by the [place] FDA and  has been authorized for detection and/or diagnosis of SARS-CoV-2 by FDA under an Emergency Use Authorization (EUA). This EUA will remain  in effect (meaning this test can be used) for the duration of the COVID-19 declaration under Section 564(b)(1) of the Act, 21 U.S.C.section 360bbb-3(b)(1), unless the authorization is terminated  or revoked sooner.       Influenza A by PCR NEGATIVE NEGATIVE Final   Influenza B by PCR NEGATIVE NEGATIVE Final    Comment: (NOTE) The Xpert Xpress SARS-CoV-2/FLU/RSV plus assay is intended as an aid in the diagnosis of influenza from Nasopharyngeal swab specimens and should not be used as a sole basis for treatment. Nasal washings and aspirates are unacceptable for Xpert Xpress SARS-CoV-2/FLU/RSV testing.  Fact Sheet for Patients: BloggerCourse.com  Fact Sheet for Healthcare Providers: SeriousBroker.it  This test is not yet approved or cleared by the [place] FDA and has been authorized for detection and/or diagnosis of SARS-CoV-2 by FDA under an Emergency Use Authorization (EUA). This EUA will remain in effect (meaning this test can be used) for the duration of the COVID-19 declaration under Section 564(b)(1) of the Act, 21 U.S.C. section 360bbb-3(b)(1), unless the authorization is terminated or revoked.  Performed at [facility], [address], [place], [place] [address]   MRSA PCR Screening     Status: None   Collection Time:  02/01/21  9:25 PM   Specimen: Nasopharyngeal  Result Value Ref Range Status   MRSA by PCR NEGATIVE NEGATIVE Final    Comment:        The GeneXpert MRSA Assay (FDA approved for NASAL specimens only), is one component of a comprehensive MRSA colonization surveillance program. It is not intended to diagnose MRSA infection nor to guide or monitor treatment for MRSA infections. Performed at [facility], [address], [place], [place] [address]     Radiology Studies: No results found.    Brittany Amirault T. Jaciel Diem  [facility]  If 7PM-7AM, please contact night-coverage [email or web address] 02/03/2021, 12:11 PM

## 2021-02-03 NOTE — Progress Notes (Signed)
I have sent a message to our office's scheduling team requesting a follow-up appointment, and our office will call the patient with this information.   

## 2021-02-03 NOTE — Progress Notes (Signed)
Patient sats at 93% on 2L at rest, walks appx 10 feet in room, desats to 78% on 2L oxygen, after resting while still standing, recovers to 90% on 2L oxygen, baseline is 2L oxygen at home a couple of times per day, dr Alanda Slim advised

## 2021-02-03 NOTE — Progress Notes (Signed)
Progress Note  Patient Name: Sherri Hart Block Date of Encounter: 02/03/2021  [facility] Cardiologist: Dr Rennis Golden  Subjective   No CP; dyspnea improved  Inpatient Medications    Scheduled Meds: . apixaban  5 mg Oral BID  . azithromycin  500 mg Oral Daily  . diclofenac Sodium  2 g Topical QID  . diltiazem  60 mg Oral Q6H  . furosemide  40 mg Oral Daily  . methylPREDNISolone (SOLU-MEDROL) injection  60 mg Intravenous Q12H  . nicotine  14 mg Transdermal Daily   Continuous Infusions:  PRN Meds: HYDROcodone-acetaminophen, ipratropium, levalbuterol, loratadine, metoprolol tartrate, morphine injection   Vital Signs    Vitals:   02/02/21 2300 02/03/21 0355 02/03/21 0633 02/03/21 0720  BP: 120/66 133/80 130/82 135/80  Pulse: 94 92  86  Resp: 17 18  18   Temp: (!) 97.5 F (36.4 C) 97.7 F (36.5 C)  98 F (36.7 C)  TempSrc: Oral Oral  Oral  SpO2: 94% 93%  93%  Weight:      Height:        Intake/Output Summary (Last 24 hours) at 02/03/2021 0939 Last data filed at 02/03/2021 0800 Gross per 24 hour  Intake 720 ml  Output 2950 ml  Net -2230 ml   Last 3 Weights 02/01/2021 10/04/2020 07/07/2020  Weight (lbs) 234 lb 9.1 oz 195 lb 196 lb 3.4 oz  Weight (kg) 106.4 kg 88.451 kg 89 kg      Telemetry    Atrial fibrillation rate controlled- Personally Reviewed  Physical Exam   GEN: No acute distress.   Neck: No JVD Cardiac: irregular Respiratory: diminished BS throughout GI: Soft, nontender, non-distended  MS: No edema Neuro:  Nonfocal  Psych: Normal affect   Labs    High Sensitivity Troponin:   Recent Labs  Lab 02/01/21 1255 02/01/21 1455  TROPONINIHS 10 12      Chemistry Recent Labs  Lab 02/01/21 1255 02/01/21 1422  NA 139 140  K 4.4 4.1  CL 107  --   CO2 25  --   GLUCOSE 99  --   BUN 19  --   CREATININE 1.03*  --   CALCIUM 9.4  --   PROT 6.9  --   ALBUMIN 3.8  --   AST 13*  --   ALT 9  --   ALKPHOS 67  --   BILITOT 0.9  --   GFRNONAA 58*   --   ANIONGAP 7  --      Hematology Recent Labs  Lab 02/01/21 1255 02/01/21 1422  WBC 7.1  --   RBC 4.51  --   HGB 14.9 14.3  HCT 46.3* 42.0  MCV 102.7*  --   MCH 33.0  --   MCHC 32.2  --   RDW 13.5  --   PLT 184  --     BNP Recent Labs  Lab 02/01/21 1255  BNP 258.1*     DDimer  Recent Labs  Lab 02/01/21 1348  DDIMER 0.38     Radiology    DG Chest 2 View  Result Date: 02/01/2021 CLINICAL DATA:  Fall 4 days ago EXAM: CHEST - 2 VIEW COMPARISON:  10/03/2020 FINDINGS: Heart size upper normal. Atherosclerotic calcification aortic arch. Negative for heart failure. Mild atelectasis or scarring in the lingula unchanged from prior studies. No acute infiltrate or effusion. IMPRESSION: No active cardiopulmonary disease. Electronically Signed   By: 10/05/2020 M.D.   On: 02/01/2021 13:53   CT  Head Wo Contrast  Result Date: 02/01/2021 CLINICAL DATA:  Head trauma. Recent fall. History of atrial fibrillation. EXAM: CT HEAD WITHOUT CONTRAST TECHNIQUE: Contiguous axial images were obtained from the base of the skull through the vertex without intravenous contrast. COMPARISON:  None. FINDINGS: Brain: There is no evidence of an acute infarct, intracranial hemorrhage, mass, midline shift, or extra-axial fluid collection. Mild cerebral atrophy is within normal limits for age. Hypodensities in the cerebral white matter bilaterally are nonspecific but compatible with mild chronic small vessel ischemic disease. Vascular: Calcified atherosclerosis at the skull base. No hyperdense vessel. Skull: No fracture or suspicious osseous lesion. Sinuses/Orbits: Mild posterior right ethmoid air cell opacification. Clear mastoid air cells. Unremarkable orbits. Other: None. IMPRESSION: 1. No evidence of acute intracranial abnormality. 2. Mild chronic small vessel ischemic disease. Electronically Signed   By: Sebastian Ache M.D.   On: 02/01/2021 14:29   DG Knee Complete 4 Views Right  Result Date:  02/01/2021 CLINICAL DATA:  Fall EXAM: RIGHT KNEE - COMPLETE 4+ VIEW COMPARISON:  None. 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 No prosthetic loosening. There is a small joint effusion. Negative for fracture. IMPRESSION: Negative for fracture.  Right knee replacement Electronically Signed   By: Marlan Palau M.D.   On: 02/01/2021 13:52   DG Hip Unilat W or Wo Pelvis 2-3 Views Right  Result Date: 02/01/2021 CLINICAL DATA:  Fall 4 days ago.  Pain EXAM: DG HIP (WITH OR WITHOUT PELVIS) 2-3V RIGHT COMPARISON:  None. FINDINGS: There is no evidence of hip fracture or dislocation. There is no evidence of arthropathy or other focal bone abnormality. Cage fusion L5-S1. IMPRESSION: Negative. Electronically Signed   By: Marlan Palau M.D.   On: 02/01/2021 13:51    Patient Profile     Sherri Hart Blockis a 75 y.o.femalewith a hx of afib, copd, chronic diasotlicwho is being seenfor the evaluation of afib with RVRat the request of Dr.Anwar.  Echocardiogram October 2021 showed normal LV function, mild left ventricular hypertrophy, mildly enlarged right ventricle with mild RV dysfunction, severe biatrial enlargement, small pericardial effusion.  Assessment & Plan    1 Permanent atrial fibrillation-heart rate is much improved.  Change Cardizem to home dose (Cardizem CD 180 mg daily).  Continue apixaban.  Note LV function is normal.  2 acute on chronic diastolic congestive heart failure-she is euvolemic today.  Continue Lasix 40 mg daily at discharge.  3 tobacco abuse-patient counseled on discontinuing.\  4 COPD flare-continue bronchodilators.  Management per primary care.  Patient can be discharged from a cardiac standpoint on present medications.  We will arrange follow-up with Dr. Rennis Golden in approximately 3 months.  We will sign off.  Please call with questions.  For questions or updates, please contact [facility] Please consult [email or web address] for contact info under         Signed, Olga Millers, MD  02/03/2021, 9:39 AM

## 2021-02-04 DIAGNOSIS — Z599 Problem related to housing and economic circumstances, unspecified: Secondary | ICD-10-CM

## 2021-02-04 DIAGNOSIS — F17210 Nicotine dependence, cigarettes, uncomplicated: Secondary | ICD-10-CM | POA: Diagnosis not present

## 2021-02-04 DIAGNOSIS — J441 Chronic obstructive pulmonary disease with (acute) exacerbation: Secondary | ICD-10-CM | POA: Diagnosis not present

## 2021-02-04 DIAGNOSIS — I4891 Unspecified atrial fibrillation: Secondary | ICD-10-CM | POA: Diagnosis not present

## 2021-02-04 DIAGNOSIS — Z9119 Patient's noncompliance with other medical treatment and regimen: Secondary | ICD-10-CM

## 2021-02-04 LAB — RENAL FUNCTION PANEL
Albumin: 3.5 g/dL (ref 3.5–5.0)
Anion gap: 9 (ref 5–15)
BUN: 32 mg/dL — ABNORMAL HIGH (ref 8–23)
CO2: 26 mmol/L (ref 22–32)
Calcium: 8.9 mg/dL (ref 8.9–10.3)
Chloride: 100 mmol/L (ref 98–111)
Creatinine, Ser: 1.17 mg/dL — ABNORMAL HIGH (ref 0.44–1.00)
GFR, Estimated: 50 mL/min — ABNORMAL LOW (ref >60)
Glucose, Bld: 146 mg/dL — ABNORMAL HIGH (ref 70–99)
Phosphorus: 3 mg/dL (ref 2.5–4.6)
Potassium: 4 mmol/L (ref 3.5–5.1)
Sodium: 135 mmol/L (ref 135–145)

## 2021-02-04 LAB — CBC
HCT: 43.3 % (ref 36.0–46.0)
Hemoglobin: 14.4 g/dL (ref 12.0–15.0)
MCH: 33.3 pg (ref 26.0–34.0)
MCHC: 33.3 g/dL (ref 30.0–36.0)
MCV: 100 fL (ref 80.0–100.0)
Platelets: 203 10*3/uL (ref 150–400)
RBC: 4.33 MIL/uL (ref 3.87–5.11)
RDW: 13.5 % (ref 11.5–15.5)
WBC: 12.7 10*3/uL — ABNORMAL HIGH (ref 4.0–10.5)
nRBC: 0 % (ref 0.0–0.2)

## 2021-02-04 LAB — MAGNESIUM: Magnesium: 1.7 mg/dL (ref 1.7–2.4)

## 2021-02-04 MED ORDER — APIXABAN 5 MG PO TABS: 5.0000 mg | ORAL_TABLET | Freq: Two times a day (BID) | ORAL | 1 refills | Status: DC

## 2021-02-04 MED ORDER — PREDNISONE 50 MG PO TABS: 50.0000 mg | ORAL_TABLET | Freq: Every day | ORAL | 0 refills | Status: AC

## 2021-02-04 MED ORDER — FUROSEMIDE 40 MG PO TABS: 40.0000 mg | ORAL_TABLET | Freq: Every day | ORAL | 1 refills | Status: DC

## 2021-02-04 MED ORDER — ALBUTEROL SULFATE HFA 108 (90 BASE) MCG/ACT IN AERS: 2.0000 | INHALATION_SPRAY | Freq: Four times a day (QID) | RESPIRATORY_TRACT | 2 refills | Status: DC | PRN

## 2021-02-04 MED ORDER — DILTIAZEM HCL ER COATED BEADS 180 MG PO CP24: 180.0000 mg | ORAL_CAPSULE | Freq: Every day | ORAL | 1 refills | Status: DC

## 2021-02-04 MED ORDER — STIOLTO RESPIMAT 2.5-2.5 MCG/ACT IN AERS: 2.0000 | INHALATION_SPRAY | Freq: Every day | RESPIRATORY_TRACT | 2 refills | Status: DC

## 2021-02-04 MED ORDER — UMECLIDINIUM-VILANTEROL 62.5-25 MCG/INH IN AEPB
1.0000 | INHALATION_SPRAY | Freq: Every day | RESPIRATORY_TRACT | Status: DC
  Administered 2021-02-04: 1 via RESPIRATORY_TRACT
  Filled 2021-02-04 (×2): qty 14

## 2021-02-04 MED ORDER — NICOTINE 14 MG/24HR TD PT24: 14.0000 mg | MEDICATED_PATCH | Freq: Every day | TRANSDERMAL | 0 refills | Status: DC

## 2021-02-04 MED ORDER — ALBUTEROL SULFATE (2.5 MG/3ML) 0.083% IN NEBU: 2.5000 mg | INHALATION_SOLUTION | RESPIRATORY_TRACT | Status: DC | PRN

## 2021-02-04 MED ORDER — FUROSEMIDE 40 MG PO TABS
40.0000 mg | ORAL_TABLET | Freq: Every day | ORAL | 1 refills | Status: DC
  Filled 2021-02-04: qty 90, 90d supply, fill #0

## 2021-02-04 NOTE — TOC Initial Note (Signed)
Transition of Care [facility]) - Initial/Assessment Note    Patient Details  Name: Sherri Hart MRN: [id number] Date of Birth: 11-17-1945  Transition of [facility]) CM/SW Contact:    Bess Kinds, RN Phone Number: [phone] 02/04/2021, 12:17 PM  Clinical Narrative:                  Spoke with patient at the bedside to discuss transition needs. Recent transplant to [facility] about 4 months ago. Already has oxygen set up at home, but could not recall name of the providing company. Discussed need for PCP - she has not established with local PCP. She is agreeable to information for [facility] - information placed on AVS and patient verbalized understanding to call and schedule appointment. Discussed preferred pharmacy - [facility] on [facility] pharmacy is closed on weekends). MD message to divert [facility] prescriptions to [facility] vs a printed copy. Patient stated that her granddaughter will pick her up at discharge. No further TOC needs identfied.        Patient Goals and CMS Choice        Expected Discharge Plan and Services           Expected Discharge Date: 02/04/21                                    Prior Living Arrangements/Services                       Activities of Daily Living Home Assistive Devices/Equipment: Gilmer Mor (specify quad or straight) ADL Screening (condition at time of admission) Patient's cognitive ability adequate to safely complete daily activities?: Yes Is the patient deaf or have difficulty hearing?: No Does the patient have difficulty seeing, even when wearing glasses/contacts?: No Does the patient have difficulty concentrating, remembering, or making decisions?: No Patient able to express need for assistance with ADLs?: Yes Does the patient have difficulty dressing or bathing?: No Independently performs ADLs?: Yes (appropriate for developmental age) Does the patient have difficulty walking or climbing stairs?: Yes Weakness of  Legs: Right Weakness of Arms/Hands: None  Permission Sought/Granted                  Emotional Assessment              Admission diagnosis:  Shortness of breath [R06.02] Atrial fibrillation with RVR (HCC) [I48.91] Patient Active Problem List   Diagnosis Date Noted  . Atrial fibrillation with RVR (HCC) 02/01/2021  . Acute diastolic (congestive) heart failure (HCC) 07/05/2020  . Elevated troponin level not due myocardial infarction 07/04/2020  . Hypomagnesemia 07/04/2020  . Atrial fibrillation with rapid ventricular response (HCC) 07/04/2020  . Acute cardiogenic pulmonary edema (HCC) 07/04/2020  . COPD with acute exacerbation (HCC) 07/04/2020  . Nicotine dependence, cigarettes, uncomplicated 07/04/2020   PCP:  Patient, No Pcp Per (Inactive) Pharmacy:   [facility][id number] [place], [place] - [address] AT [facility] OF [address] & [facility] [address] [place] [address] Phone: [phone] Fax: [phone]  [facility] #[id number] [place], [place] - [address] AT [facility] OF [address] & [address] [place] [address] Phone: [phone] Fax: [phone]     Social Determinants of Health (SDOH) Interventions    Readmission Risk Interventions No flowsheet data found.

## 2021-02-04 NOTE — Discharge Summary (Signed)
Physician Discharge Summary  Sherri Hart Block WUJ:[id number] DOB: 05/12/1946 DOA: 02/01/2021  PCP: Patient, No Pcp Per (Inactive)  Admit date: 02/01/2021 Discharge date: 02/04/2021  Admitted From: Home Disposition: Home  Recommendations for Outpatient Follow-up:  1. Follow ups as below. 2. Please obtain CBC/BMP/Mag at follow up 3. Please follow up on the following pending results: None  Home Health: None required Equipment/Devices: Patient has home oxygen  Discharge Condition: Stable CODE STATUS: Full code   Follow-up Information    Hilty, Lisette AbuKenneth C, MD Follow up.   Specialty: Cardiology Why: Cardiology team - Dr. Blanchie DessertHilty's office will call you with your follow-up appointment. Contact information: [address] [place] [address] [phone]        [facility]. Call.   Why: Call to schedule an appointment to establish for primary care Contact information: [address],  [place], [place] [phone]               Hospital Course: 75 year old F with PMH of persistent A. 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 fib with RVR,  COPD exacerbation and atypical chest pain.  Cardiology consulted.  RVR improved with IV Cardizem push, and she was transitioned to p.o. Cardizem. She was also started on systemic steroid, antibiotics and nebulizers for COPD exacerbation.    Atrial fibrillation likely due to COPD exacerbation which is likely due to noncompliance with her medications and ongoing tobacco use.  Patient remained rate controlled.  Cardizem consolidated to p.o. Cardizem CD.  She was cleared by cardiology from cardiac standpoint.  In regards to COPD, symptoms improved but she continues to require 3 L with ambulation to maintain appropriate saturation.  Discharged on p.o. prednisone  for 3 more days.  She has already completed course of antibiotic.  Will discharge on the Stiolto and as needed albuterol.  She was counseled on the importance of compliance and tobacco cessation.  TOC to assisted with financial resources and PCP.  See individual problem list below for more on hospital course.  Discharge Diagnoses:  Acute on chronic hypoxic respiratory failure due to COPD exacerbation: on 2 L that she uses intermittently.  COPD exacerbation likely due to noncompliance and ongoing cigarette smoking.  Improved with antibiotics, systemic steroid and breathing treatments but requires 3 L to maintain appropriate saturation with ambulation. -Discharged on 3 L by nasal cannula -Prednisone 50 mg daily for the next 3 days  -Received azithromycin 500 mg daily for 4 days in-house -Stiolto and as needed albuterol -She was counseled on the importance of compliance with medications and tobacco cessation. -TOC assisted with financial resources and PCP  Persistent A. fib with RVR: Rate controlled.  CHA2DS2-VASc score greater than 3 -Continue home Cardizem and Eliquis-Rx is renewed -Cleared for discharge by cardiology for outpatient follow-up  Chronic diastolic CHF: Appears euvolemic on exam.  About 2.7 L UOP/24 hours.  Creatinine slightly up.  Maybe not taking her Lasix at home.  -Continue p.o. Lasix 40 mg daily.  Rx renewed  Atypical chest pain-reproducible to palpation.  Could be partly due to A. fib and COPD as well.  Low suspicion for ACS.  Resolved. -Manage A. fib and COPD as above  Fall at home -PT/OT eval-no need identified.  Tobacco use disorder: Reports cutting down to half a pack a day recently -Encouraged  cessation. -Nicotine patch if needed  Financial barrier: she says she has difficulty paying her co-pays although she tends to afford half a pack a day of cigarettes.  -She has been encouraged to quit smoking cigarettes -TOC assisted with financial resources and  PCP  Class I obesity Body mass index is 36.74 kg/m.  -Encourage lifestyle change to lose weight          Discharge Exam: Vitals:   02/04/21 0749 02/04/21 1031  BP: 132/78 (!) 116/98  Pulse: 77   Resp: 16   Temp: 97.7 F (36.5 C)   SpO2: 93%     GENERAL: No apparent distress.  Nontoxic. HEENT: MMM.  Vision and hearing grossly intact.  NECK: Supple.  No apparent JVD.  RESP: 93% on 2 L at rest.  No IWOB.  Diminished aeration over lower lung fields. CVS:  RRR. Heart sounds normal.  ABD/GI/GU: Bowel sounds present. Soft. Non tender.  MSK/EXT:  Moves extremities. No apparent deformity. No edema.  SKIN: no apparent skin lesion or wound NEURO: Awake, alert and oriented appropriately.  No apparent focal neuro deficit. PSYCH: Calm. Normal affect.   Discharge Instructions  Discharge Instructions    (HEART FAILURE PATIENTS) Call MD:  Anytime you have any of the following symptoms: 1) 3 pound weight gain in 24 hours or 5 pounds in 1 week 2) shortness of breath, with or without a dry hacking cough 3) swelling in the hands, feet or stomach 4) if you have to sleep on extra pillows at night in order to breathe.   Complete by: As directed    Call MD for:  difficulty breathing, headache or visual disturbances   Complete by: As directed    Call MD for:  extreme fatigue   Complete by: As directed    Call MD for:  persistant dizziness or light-headedness   Complete by: As directed    Call MD for:  temperature >100.4   Complete by: As directed    Diet - low sodium heart healthy   Complete by: As directed    Discharge instructions   Complete by: As directed    It has been a pleasure taking care of you!  You were hospitalized due to COPD exacerbation and atrial fibrillation (irregular and fast heart rate).  Your atrial fibrillation is likely from COPD exacerbation.  Your COPD exacerbation is likely due to smoking cigarettes and not using your medications.  We strongly recommend you quit  smoking cigarettes.  We also recommend you use your medications as prescribed.  It is important that you quit smoking cigarettes.  You may use nicotine patch to help you quit smoking.  Nicotine patch is available over-the-counter.  You may also discuss other options to help you quit smoking with your primary care doctor. You can also talk to professional counselors at 1-800-QUIT-NOW [phone]) for free smoking cessation counseling.  Please review your new medication list and the directions on your medications before you take them.  Follow-up with your primary care doctor in 1 to 2 weeks or establish a new PCP as soon as possible     Take care,   Increase activity slowly   Complete by: As directed      Allergies as of 02/04/2021      Reactions   Penicillins    Did it involve swelling of the face/tongue/throat, SOB, or low BP? Yes Did it involve sudden or severe rash/hives, skin peeling, or any reaction on the inside of your mouth or  nose? N Did you need to seek medical attention at a hospital or doctor's office? Y When did it last happen?childhood If all above answers are "NO", may proceed with cephalosporin use.      Medication List    STOP taking these medications   doxycycline 100 MG capsule Commonly known as: VIBRAMYCIN   doxycycline 100 MG tablet Commonly known as: VIBRA-TABS   OVER THE COUNTER MEDICATION   Spiriva HandiHaler 18 MCG inhalation capsule Generic drug: tiotropium     TAKE these medications   acetaminophen 500 MG tablet Commonly known as: TYLENOL Take 500 mg by mouth every 6 (six) hours as needed for moderate pain.   albuterol 108 (90 Base) MCG/ACT inhaler Commonly known as: VENTOLIN HFA Inhale 2 puffs into the lungs every 6 (six) hours as needed for wheezing or shortness of breath.   apixaban 5 MG Tabs tablet Commonly known as: ELIQUIS Take 1 tablet (5 mg total) by mouth 2 (two) times daily.   diltiazem 180 MG 24 hr capsule Commonly  known as: CARDIZEM CD Take 1 capsule (180 mg total) by mouth daily.   furosemide 40 MG tablet Commonly known as: LASIX Take 1 tablet (40 mg total) by mouth daily.   loratadine 10 MG tablet Commonly known as: CLARITIN Take 10 mg by mouth daily as needed for allergies.   nicotine 14 mg/24hr patch Commonly known as: NICODERM CQ - dosed in mg/24 hours Place 1 patch (14 mg total) onto the skin daily.   predniSONE 50 MG tablet Commonly known as: DELTASONE Take 1 tablet (50 mg total) by mouth daily for 3 days.   Stiolto Respimat 2.5-2.5 MCG/ACT Aers Generic drug: Tiotropium Bromide-Olodaterol Inhale 2 puffs into the lungs daily.       Consultations: Cardiology Procedures/Studies:   DG Chest 2 View  Result Date: 02/01/2021 CLINICAL DATA:  Fall 4 days ago EXAM: CHEST - 2 VIEW COMPARISON:  10/03/2020 FINDINGS: Heart size upper normal. Atherosclerotic calcification aortic arch. Negative for heart failure. Mild atelectasis or scarring in the lingula unchanged from prior studies. No acute infiltrate or effusion. IMPRESSION: No active cardiopulmonary disease. Electronically Signed   By: Marlan Palau M.D.   On: 02/01/2021 13:53   CT Head Wo Contrast  Result Date: 02/01/2021 CLINICAL DATA:  Head trauma. Recent fall. History of atrial fibrillation. EXAM: CT HEAD WITHOUT CONTRAST TECHNIQUE: Contiguous axial images were obtained from the base of the skull through the vertex without intravenous contrast. COMPARISON:  None. FINDINGS: Brain: There is no evidence of an acute infarct, intracranial hemorrhage, mass, midline shift, or extra-axial fluid collection. Mild cerebral atrophy is within normal limits for age. Hypodensities in the cerebral white matter bilaterally are nonspecific but compatible with mild chronic small vessel ischemic disease. Vascular: Calcified atherosclerosis at the skull base. No hyperdense vessel. Skull: No fracture or suspicious osseous lesion. Sinuses/Orbits: Mild  posterior right ethmoid air cell opacification. Clear mastoid air cells. Unremarkable orbits. Other: None. IMPRESSION: 1. No evidence of acute intracranial abnormality. 2. Mild chronic small vessel ischemic disease. Electronically Signed   By: Sebastian Ache M.D.   On: 02/01/2021 14:29   DG Knee Complete 4 Views Right  Result Date: 02/01/2021 CLINICAL DATA:  Fall EXAM: RIGHT KNEE - COMPLETE 4+ VIEW COMPARISON:  None. 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 No prosthetic loosening. There is a small joint effusion. Negative for fracture. IMPRESSION: Negative for fracture.  Right knee replacement Electronically Signed   By: Marlan Palau M.D.   On: 02/01/2021 13:52  DG Hip Unilat W or Wo Pelvis 2-3 Views Right  Result Date: 02/01/2021 CLINICAL DATA:  Fall 4 days ago.  Pain EXAM: DG HIP (WITH OR WITHOUT PELVIS) 2-3V RIGHT COMPARISON:  None. FINDINGS: There is no evidence of hip fracture or dislocation. There is no evidence of arthropathy or other focal bone abnormality. Cage fusion L5-S1. IMPRESSION: Negative. Electronically Signed   By: Marlan Palau M.D.   On: 02/01/2021 13:51        The results of significant diagnostics from this hospitalization (including imaging, microbiology, ancillary and laboratory) are listed below for reference.     Microbiology: Recent Results (from the past 240 hour(s))  Resp Panel by RT-PCR (Flu A&B, Covid) Nasopharyngeal Swab     Status: None   Collection Time: 02/01/21  1:47 PM   Specimen: Nasopharyngeal Swab; Nasopharyngeal(NP) swabs in vial transport medium  Result Value Ref Range Status   SARS Coronavirus 2 by RT PCR NEGATIVE NEGATIVE Final    Comment: (NOTE) SARS-CoV-2 target nucleic acids are NOT DETECTED.  The SARS-CoV-2 RNA is generally detectable in upper respiratory specimens during the acute phase of infection. The lowest concentration of SARS-CoV-2 viral copies this assay can detect is 138 copies/mL. A negative result does not  preclude SARS-Cov-2 infection and should not be used as the sole basis for treatment or other patient management decisions. A negative result may occur with  improper specimen collection/handling, submission of specimen other than nasopharyngeal swab, presence of viral mutation(s) within the areas targeted by this assay, and inadequate number of viral copies(<138 copies/mL). A negative result must be combined with clinical observations, patient history, and epidemiological information. The expected result is Negative.  Fact Sheet for Patients:  BloggerCourse.com  Fact Sheet for Healthcare Providers:  SeriousBroker.it  This test is no t yet approved or cleared by the [place] FDA and  has been authorized for detection and/or diagnosis of SARS-CoV-2 by FDA under an Emergency Use Authorization (EUA). This EUA will remain  in effect (meaning this test can be used) for the duration of the COVID-19 declaration under Section 564(b)(1) of the Act, 21 U.S.C.section 360bbb-3(b)(1), unless the authorization is terminated  or revoked sooner.       Influenza A by PCR NEGATIVE NEGATIVE Final   Influenza B by PCR NEGATIVE NEGATIVE Final    Comment: (NOTE) The Xpert Xpress SARS-CoV-2/FLU/RSV plus assay is intended as an aid in the diagnosis of influenza from Nasopharyngeal swab specimens and should not be used as a sole basis for treatment. Nasal washings and aspirates are unacceptable for Xpert Xpress SARS-CoV-2/FLU/RSV testing.  Fact Sheet for Patients: BloggerCourse.com  Fact Sheet for Healthcare Providers: SeriousBroker.it  This test is not yet approved or cleared by the [place] FDA and has been authorized for detection and/or diagnosis of SARS-CoV-2 by FDA under an Emergency Use Authorization (EUA). This EUA will remain in effect (meaning this test can be used) for the  duration of the COVID-19 declaration under Section 564(b)(1) of the Act, 21 U.S.C. section 360bbb-3(b)(1), unless the authorization is terminated or revoked.  Performed at [facility], [address], [place], [place] [address]   MRSA PCR Screening     Status: None   Collection Time: 02/01/21  9:25 PM   Specimen: Nasopharyngeal  Result Value Ref Range Status   MRSA by PCR NEGATIVE NEGATIVE Final    Comment:        The GeneXpert MRSA Assay (FDA approved for NASAL specimens only), is one component  of a comprehensive MRSA colonization surveillance program. It is not intended to diagnose MRSA infection nor to guide or monitor treatment for MRSA infections. Performed at [facility], [address], [place], [place] [address]      Labs:  CBC: Recent Labs  Lab 02/01/21 1255 02/01/21 1422 02/04/21 0151  WBC 7.1  --  12.7*  NEUTROABS 4.4  --   --   HGB 14.9 14.3 14.4  HCT 46.3* 42.0 43.3  MCV 102.7*  --  100.0  PLT 184  --  203   BMP &GFR Recent Labs  Lab 02/01/21 1255 02/01/21 1422 02/04/21 0151  NA 139 140 135  K 4.4 4.1 4.0  CL 107  --  100  CO2 25  --  26  GLUCOSE 99  --  146*  BUN 19  --  32*  CREATININE 1.03*  --  1.17*  CALCIUM 9.4  --  8.9  MG  --   --  1.7  PHOS  --   --  3.0   Estimated Creatinine Clearance: 54.5 mL/min (A) (by C-G formula based on SCr of 1.17 mg/dL (H)). Liver & Pancreas: Recent Labs  Lab 02/01/21 1255 02/04/21 0151  AST 13*  --   ALT 9  --   ALKPHOS 67  --   BILITOT 0.9  --   PROT 6.9  --   ALBUMIN 3.8 3.5   No results for input(s): LIPASE, AMYLASE in the last 168 hours. No results for input(s): AMMONIA in the last 168 hours. Diabetic: No results for input(s): HGBA1C in the last 72 hours. No results for input(s): GLUCAP in the last 168 hours. Cardiac Enzymes: No results for input(s): CKTOTAL, CKMB, CKMBINDEX, TROPONINI in the last 168 hours. No results for input(s): PROBNP in the last 8760  hours. Coagulation Profile: No results for input(s): INR, PROTIME in the last 168 hours. Thyroid Function Tests: No results for input(s): TSH, T4TOTAL, FREET4, T3FREE, THYROIDAB in the last 72 hours. Lipid Profile: No results for input(s): CHOL, HDL, LDLCALC, TRIG, CHOLHDL, LDLDIRECT in the last 72 hours. Anemia Panel: No results for input(s): VITAMINB12, FOLATE, FERRITIN, TIBC, IRON, RETICCTPCT in the last 72 hours. Urine analysis: No results found for: COLORURINE, APPEARANCEUR, LABSPEC, PHURINE, GLUCOSEU, HGBUR, BILIRUBINUR, KETONESUR, PROTEINUR, UROBILINOGEN, NITRITE, LEUKOCYTESUR Sepsis Labs: Invalid input(s): PROCALCITONIN, LACTICIDVEN   Time coordinating discharge: 40 minutes  SIGNED:  Almon Hercules, MD  [facility] 02/04/2021, 2:54 PM  If 7PM-7AM, please contact night-coverage [email or web address]

## 2021-02-04 NOTE — Progress Notes (Signed)
Mobility Specialist - Progress Note   02/04/21 1116  Mobility  Activity Ambulated in hall  Level of Assistance Standby assist, set-up cues, supervision of patient - no hands on  Assistive Device Front wheel walker  Distance Ambulated (ft) 70 ft  Mobility Ambulated with assistance in hallway  Mobility Response Tolerated well  Mobility performed by Mobility specialist  $Mobility charge 1 Mobility   Pre-mobility:       2L O2: 93% SpO2      RA: 91% SpO2 During mobility:       RA: 82% SpO2      3L O2: 90% SpO2 Post-mobility:       2L O2: 95% SpO2  Pt seen at request of RN. Distance limited by lower back pain that pt attributes to a fall in the past. HR up to 120 w/ ambulation compared to 87 at rest, quickly recovered once seated. Pt to recliner after walk, she expressed understanding to use the call bell when she'd like to get out of the chair for any reason.   Sherri Hart Mobility Specialist Mobility Specialist Phone: [phone]

## 2021-02-04 NOTE — Progress Notes (Signed)
SATURATION QUALIFICATIONS: (This note is used to comply with regulatory documentation for home oxygen)  Patient Saturations on Room Air at Rest = 91%  Patient Saturations on Room Air while Ambulating = 82%  Patient Saturations on 3 Liters of oxygen while Ambulating = 90%  Please briefly explain why patient needs home oxygen: Pt desats with activity.

## 2021-02-04 NOTE — Plan of Care (Signed)

## 2021-02-04 NOTE — Progress Notes (Signed)
RN went over discharge summary with pt. Pt has belongings, including prescriptions. NT removed PIVs and is transporting pt to private vehicle. Pt's granddaughter to transport pt home.

## 2021-02-06 ENCOUNTER — Other Ambulatory Visit (HOSPITAL_COMMUNITY): Payer: Self-pay

## 2021-03-13 ENCOUNTER — Other Ambulatory Visit: Payer: Self-pay | Admitting: Family Medicine

## 2021-03-13 ENCOUNTER — Ambulatory Visit
Admission: RE | Admit: 2021-03-13 | Discharge: 2021-03-13 | Disposition: A | Discharge status: Home / Self Care | Payer: Medicare PPO | Source: Ambulatory Visit | Attending: Family Medicine | Admitting: Family Medicine

## 2021-03-13 DIAGNOSIS — M25561 Pain in right knee: Secondary | ICD-10-CM

## 2021-04-03 ENCOUNTER — Other Ambulatory Visit (HOSPITAL_COMMUNITY): Payer: Self-pay | Admitting: Orthopedic Surgery

## 2021-04-03 DIAGNOSIS — Z96651 Presence of right artificial knee joint: Secondary | ICD-10-CM

## 2021-04-11 ENCOUNTER — Encounter (HOSPITAL_COMMUNITY): Payer: Medicare HMO

## 2021-04-11 ENCOUNTER — Encounter (HOSPITAL_COMMUNITY): Payer: Self-pay

## 2021-04-16 ENCOUNTER — Other Ambulatory Visit: Payer: Self-pay

## 2021-04-16 ENCOUNTER — Ambulatory Visit (HOSPITAL_COMMUNITY)
Admission: RE | Admit: 2021-04-16 | Discharge: 2021-04-16 | Disposition: A | Discharge status: Home / Self Care | Payer: Medicare HMO | Source: Ambulatory Visit | Attending: Orthopedic Surgery | Admitting: Orthopedic Surgery

## 2021-04-16 DIAGNOSIS — Z96651 Presence of right artificial knee joint: Secondary | ICD-10-CM | POA: Diagnosis present

## 2021-04-16 MED ORDER — TECHNETIUM TC 99M MEDRONATE IV KIT
20.0000 | PACK | Freq: Once | INTRAVENOUS | Status: AC | PRN
  Administered 2021-04-16: 20 via INTRAVENOUS

## 2021-04-19 ENCOUNTER — Encounter: Payer: Self-pay | Admitting: Physician Assistant

## 2021-04-19 ENCOUNTER — Other Ambulatory Visit: Payer: Self-pay

## 2021-04-19 ENCOUNTER — Ambulatory Visit: Payer: Medicare HMO | Admitting: Physician Assistant

## 2021-04-19 VITALS — BP 122/82 | HR 76 | Resp 20 | Ht 66.0 in | Wt 241.6 lb

## 2021-04-19 DIAGNOSIS — Z79899 Other long term (current) drug therapy: Secondary | ICD-10-CM

## 2021-04-19 DIAGNOSIS — I4821 Permanent atrial fibrillation: Secondary | ICD-10-CM | POA: Diagnosis not present

## 2021-04-19 DIAGNOSIS — J441 Chronic obstructive pulmonary disease with (acute) exacerbation: Secondary | ICD-10-CM

## 2021-04-19 DIAGNOSIS — I5032 Chronic diastolic (congestive) heart failure: Secondary | ICD-10-CM | POA: Diagnosis not present

## 2021-04-19 MED ORDER — APIXABAN 5 MG PO TABS: 5.0000 mg | ORAL_TABLET | Freq: Two times a day (BID) | ORAL | 3 refills | Status: DC

## 2021-04-19 NOTE — Patient Instructions (Signed)
Medication Instructions:  Continue current medications  *If you need a refill on your cardiac medications before your next appointment, please call your pharmacy*   Lab Work: BMP Today  If you have labs (blood work) drawn today and your tests are completely normal, you will receive your results only by: MyChart Message (if you have MyChart) OR A paper copy in the mail If you have any lab test that is abnormal or we need to change your treatment, we will call you to review the results.   Testing/Procedures: None Ordered   Follow-Up: At [facility], you and your health needs are our priority.  As part of our continuing mission to provide you with exceptional heart care, we have created designated Provider Care Teams.  These Care Teams include your primary Cardiologist (physician) and Advanced Practice Providers (APPs -  Physician Assistants and Nurse Practitioners) who all work together to provide you with the care you need, when you need it.  We recommend signing up for the patient portal called "MyChart".  Sign up information is provided on this After Visit Summary.  MyChart is used to connect with patients for Virtual Visits (Telemedicine).  Patients are able to view lab/test results, encounter notes, upcoming appointments, etc.  Non-urgent messages can be sent to your provider as well.   To learn more about what you can do with MyChart, go to [email or web address].    Your next appointment:   3 month(s)  The format for your next appointment:   In Person  Provider:   You may see Chrystie Nose, MD or one of the following Advanced Practice Providers on your designated Care Team:   Azalee Course, PA-C Micah Flesher, PA-C or  Judy Pimple, [place]   Other Instructions You have been referred to Pulmonologist

## 2021-04-19 NOTE — Progress Notes (Signed)
Cardiology Office Note:    Date:  04/21/2021   ID:  Sherri Hart, DOB 1946/03/29, MRN [id number]  PCP:  Patient, No Pcp Per (Inactive)   [facility] Providers Cardiologist:  Chrystie Nose, MD     Referring MD: No ref. provider found   Chief Complaint  Patient presents with   Follow-up    Seen for Dr. Rennis Golden     History of Present Illness:    Sherri Hart is a 75 y.o. female with a hx of atrial fibrillation, COPD on home O2, chronic diastolic heart failure.  Echocardiogram obtained in October 2021 showed EF 55 to 60%, mild RV dysfunction, PASP 37 mmHg, severe BAE.  More recently, patient was admitted in May 2022 with shortness of breath and dyspnea on exertion and a mechanical fall.  She had last some home doses of her diltiazem for 2 days.  In the ED, she was found to be in atrial fibrillation with RVR and cardiology service was consulted.  She was also treated for COPD exacerbation as well.  She was discharged on 180 mg daily of diltiazem.  She was also discharged on 3 more days of prednisone to help with COPD exacerbation.  Patient presents today for follow-up.  She appears to be euvolemic on physical exam.  I recommended a repeat basic metabolic panel to make sure she has not been dehydrated on the current dose of diuretic.  I will refill her Eliquis.  On exam, she has markedly diminished breath sounds bilaterally, I suspect that this is related to COPD.  I recommended a pulmonology referral as she will need a pulmonologist locally.  She says, she was previously living in [place], however moved up here as her daughter was killed in a hotel while trying to visit her in [place] about 8 month ago.  She is up in [place] to take care of her grandchildren.   Past Medical History:  Diagnosis Date   Atrial fibrillation (HCC)    Congestive heart failure (HCC)    COPD (chronic obstructive pulmonary disease) (HCC)     Past Surgical History:  Procedure Laterality Date    BACK SURGERY     CHOLECYSTECTOMY     NECK SURGERY     REPLACEMENT TOTAL KNEE Right    TUBAL LIGATION      Current Medications: Current Meds  Medication Sig   albuterol (VENTOLIN HFA) 108 (90 Base) MCG/ACT inhaler Inhale 2 puffs into the lungs every 6 (six) hours as needed for wheezing or shortness of breath.   diltiazem (CARDIZEM CD) 180 MG 24 hr capsule Take 1 capsule (180 mg total) by mouth daily.   furosemide (LASIX) 40 MG tablet Take 1 tablet (40 mg total) by mouth daily.   loratadine (CLARITIN) 10 MG tablet Take 10 mg by mouth daily as needed for allergies.     Allergies:   Penicillins   Social History   Socioeconomic History   Marital status: Single    Spouse name: Not on file   Number of children: Not on file   Years of education: Not on file   Highest education level: Not on file  Occupational History   Not on file  Tobacco Use   Smoking status: Every Day    Packs/day: 0.50    Types: Cigarettes   Smokeless tobacco: Never  Substance and Sexual Activity   Alcohol use: Never   Drug use: Never   Sexual activity: Not on file  Other Topics Concern  Not on file  Social History Narrative   Not on file   Social Determinants of Health   Financial Resource Strain: Not on file  Food Insecurity: Not on file  Transportation Needs: No Transportation Needs   Lack of Transportation (Medical): No   Lack of Transportation (Non-Medical): No  Physical Activity: Not on file  Stress: Not on file  Social Connections: Not on file     Family History: The patient's family history includes COPD in an other family member.  ROS:   Please see the history of present illness.     All other systems reviewed and are negative.  EKGs/Labs/Other Studies Reviewed:    The following studies were reviewed today:  Echo 07/05/2020 1. Left ventricular ejection fraction, by estimation, is 55 to 60%. The  left ventricle has normal function. The left ventricle has no regional  wall  motion abnormalities. There is mild concentric left ventricular  hypertrophy. Left ventricular diastolic  function could not be evaluated.   2. Right ventricular systolic function is mildly reduced. The right  ventricular size is mildly enlarged. There is mildly elevated pulmonary  artery systolic pressure. The estimated right ventricular systolic  pressure is 37.2 mmHg.   3. Left atrial size was severely dilated.   4. Right atrial size was severely dilated.   5. A small pericardial effusion is present.   6. The mitral valve is normal in structure. Trivial mitral valve  regurgitation. No evidence of mitral stenosis.   7. The aortic valve is tricuspid. There is mild calcification of the  aortic valve. Aortic valve regurgitation is not visualized. Mild aortic  valve sclerosis is present, with no evidence of aortic valve stenosis.   8. The inferior vena cava is dilated in size with >50% respiratory  variability, suggesting right atrial pressure of 8 mmHg.   Comparison(s): No prior Echocardiogram.   Conclusion(s)/Recommendation(s): Normal LVEF with severe biatrial  enlargement, mildly elevated RVSP/mildly decreased RV function, no  significant valve disease.    EKG:  EKG is ordered today.  The ekg ordered today demonstrates atrial fibrillation, heart rate 70s  Recent Labs: 07/05/2020: TSH 3.396 02/01/2021: ALT 9; B Natriuretic Peptide 258.1 02/04/2021: Hemoglobin 14.4; Magnesium 1.7; Platelets 203 04/19/2021: BUN 19; Creatinine, Ser 0.94; Potassium 4.5; Sodium 146  Recent Lipid Panel No results found for: CHOL, TRIG, HDL, CHOLHDL, VLDL, LDLCALC, LDLDIRECT   Risk Assessment/Calculations:    CHA2DS2-VASc Score = 3  This indicates a 3.2% annual risk of stroke. The patient's score is based upon: CHF History: Yes HTN History: No Diabetes History: No Stroke History: No Vascular Disease History: No Age Score: 1 Gender Score: 1         Physical Exam:    VS:  BP 122/82   Pulse  76   Resp 20   Ht 5\' 6"  (1.676 m)   Wt 241 lb 9.6 oz (109.6 kg)   BMI 39.00 kg/m     Wt Readings from Last 3 Encounters:  04/19/21 241 lb 9.6 oz (109.6 kg)  02/01/21 234 lb 9.1 oz (106.4 kg)  10/04/20 195 lb (88.5 kg)     GEN:  Well nourished, well developed in no acute distress HEENT: Normal NECK: No JVD; No carotid bruits LYMPHATICS: No lymphadenopathy CARDIAC: Irregularly irregular, no murmurs, rubs, gallops RESPIRATORY:  Clear to auscultation without rales, wheezing or rhonchi  ABDOMEN: Soft, non-tender, non-distended MUSCULOSKELETAL:  No edema; No deformity  SKIN: Warm and dry NEUROLOGIC:  Alert and oriented x 3 PSYCHIATRIC:  Normal affect   ASSESSMENT:    1. Permanent atrial fibrillation (HCC)   2. COPD with acute exacerbation (HCC)   3. Medication management   4. Chronic diastolic heart failure (HCC)    PLAN:    In order of problems listed above:  Permanent atrial fibrillation: Continue diltiazem and Eliquis.  Heart rate very well controlled.  Patient has no cardiac awareness of atrial fibrillation.  Chronic diastolic heart failure: Continue the current diuretic dosing.  Obtain basic metabolic panel  COPD: Refer to pulmonology service.        Medication Adjustments/Labs and Tests Ordered: Current medicines are reviewed at length with the patient today.  Concerns regarding medicines are outlined above.  Orders Placed This Encounter  Procedures   Basic Metabolic Panel (BMET)   Ambulatory referral to Pulmonology   EKG 12-Lead   Meds ordered this encounter  Medications   DISCONTD: apixaban (ELIQUIS) 5 MG TABS tablet    Sig: Take 1 tablet (5 mg total) by mouth 2 (two) times daily.    Dispense:  180 tablet    Refill:  3   apixaban (ELIQUIS) 5 MG TABS tablet    Sig: Take 1 tablet (5 mg total) by mouth 2 (two) times daily.    Dispense:  180 tablet    Refill:  3    Patient Instructions  Medication Instructions:  Continue current medications  *If you  need a refill on your cardiac medications before your next appointment, please call your pharmacy*   Lab Work: BMP Today  If you have labs (blood work) drawn today and your tests are completely normal, you will receive your results only by: MyChart Message (if you have MyChart) OR A paper copy in the mail If you have any lab test that is abnormal or we need to change your treatment, we will call you to review the results.   Testing/Procedures: None Ordered   Follow-Up: At [facility], you and your health needs are our priority.  As part of our continuing mission to provide you with exceptional heart care, we have created designated Provider Care Teams.  These Care Teams include your primary Cardiologist (physician) and Advanced Practice Providers (APPs -  Physician Assistants and Nurse Practitioners) who all work together to provide you with the care you need, when you need it.  We recommend signing up for the patient portal called "MyChart".  Sign up information is provided on this After Visit Summary.  MyChart is used to connect with patients for Virtual Visits (Telemedicine).  Patients are able to view lab/test results, encounter notes, upcoming appointments, etc.  Non-urgent messages can be sent to your provider as well.   To learn more about what you can do with MyChart, go to [email or web address].    Your next appointment:   3 month(s)  The format for your next appointment:   In Person  Provider:   You may see Chrystie Nose, MD or one of the following Advanced Practice Providers on your designated Care Team:   Azalee Course, PA-C Micah Flesher, PA-C or  Judy Pimple, [place]   Other Instructions You have been referred to Pulmonologist     Signed, Azalee Course, [place]  04/21/2021 10:55 PM    [facility]

## 2021-04-19 NOTE — Telephone Encounter (Signed)
Error

## 2021-04-20 LAB — BASIC METABOLIC PANEL
BUN/Creatinine Ratio: 20 (ref 12–28)
BUN: 19 mg/dL (ref 8–27)
CO2: 22 mmol/L (ref 20–29)
Calcium: 9.7 mg/dL (ref 8.7–10.3)
Chloride: 105 mmol/L (ref 96–106)
Creatinine, Ser: 0.94 mg/dL (ref 0.57–1.00)
Glucose: 86 mg/dL (ref 65–99)
Potassium: 4.5 mmol/L (ref 3.5–5.2)
Sodium: 146 mmol/L — ABNORMAL HIGH (ref 134–144)
eGFR: 64 mL/min/{1.73_m2} (ref >59)

## 2021-04-21 ENCOUNTER — Encounter: Payer: Self-pay | Admitting: Physician Assistant

## 2021-04-24 ENCOUNTER — Telehealth: Payer: Self-pay | Admitting: Physician Assistant

## 2021-04-24 NOTE — Telephone Encounter (Signed)
Called patient back, pt reports she has Medicare and when trying to pick up her medication it was 89 dollars for a month supply, which she cannot afford. Pt reports she has not applied for patient assistance. Advised pt the office can print patient assistance forms for her to fill out, and provide some samples of Eliquis to ensure she has medication in the interim. Pt confirmed she takes 5mg  Eliquis twice daily.  Pt agreed to come into office to pick up forms and samples. All questions/concerns addressed at this time.   2 boxes of Eliquis samples provided, Lot .

## 2021-04-24 NOTE — Telephone Encounter (Signed)
   Pt c/o medication issue:  1. Name of Medication: apixaban (ELIQUIS) 5 MG TABS tablet  2. How are you currently taking this medication (dosage and times per day)? Take 1 tablet (5 mg total) by mouth 2 (two) times daily.  3. Are you having a reaction (difficulty breathing--STAT)?   4. What is your medication issue? Pt said when she picked up this medication will cost her $100 she said that's too much and cant afford it

## 2021-04-26 ENCOUNTER — Encounter: Payer: Self-pay | Admitting: *Deleted

## 2021-04-26 NOTE — Progress Notes (Signed)
Stable renal function. Sodium level borderline high, limit salt intake.

## 2021-05-03 ENCOUNTER — Telehealth: Payer: Self-pay

## 2021-05-03 NOTE — Telephone Encounter (Signed)
   Name: Sherri Hart  DOB: 02-May-1946  MRN: [id number]  Primary Cardiologist: Chrystie Nose, MD  Pre-op covering staff: - Please add "pre-op clearance" to the appointment notes so provider is aware. - Please contact requesting surgeon's office via preferred method (i.e, phone, fax) to inform them of need for appointment prior to surgery.  If applicable, this message will also be routed to pharmacy pool and/or primary cardiologist for input on holding anticoagulant/antiplatelet agent as requested below so that this information is available to the clearing provider at time of patient's appointment.   [name], NP  05/03/2021, 2:54 PM

## 2021-05-03 NOTE — Telephone Encounter (Signed)
   [facility] Pre-operative Risk Assessment    Patient Name: Sherri Hart  DOB: [birth year] MRN: [id number]  HEARTCARE STAFF:  - IMPORTANT!!!!!! Under Visit Info/Reason for Call, type in Other and utilize the format Clearance MM/DD/YY or Clearance TBD. Do not use dashes or single digits. - Please review there is not already an duplicate clearance open for this procedure. - If request is for dental extraction, please clarify the # of teeth to be extracted. - If the patient is currently at the dentist's office, call Pre-Op Callback Staff (MA/nurse) to input urgent request.  - If the patient is not currently in the dentist office, please route to the Pre-Op pool.  Request for surgical clearance:  What type of surgery is being performed? Right total Knee arthroplasty   When is this surgery scheduled? 07/25/21  What type of clearance is required (medical clearance vs. Pharmacy clearance to hold med vs. Both)? Medical   Are there any medications that need to be held prior to surgery and how long? Eliquis   Practice name and name of physician performing surgery? [facility] Dr. Gaynelle Arabian  What is the office phone number? [phone]   7.   What is the office fax number? [phone]  8.   Anesthesia type (None, local, MAC, general) ? Unknown    Zebedee Iba 05/03/2021, 2:46 PM  _________________________________________________________________   (provider comments below)

## 2021-05-03 NOTE — Telephone Encounter (Signed)
Pt has appt 07/20/21 with Judy Pimple, PAC. Added pre op clearance needed to appt notes. Will forward notes PAC for upcoming appt. Will send FYI to surgeon's office pt has appt 07/20/21.

## 2021-05-08 ENCOUNTER — Telehealth: Payer: Self-pay

## 2021-05-08 NOTE — Telephone Encounter (Signed)
Patient with diagnosis of afib on Eliquis for anticoagulation.    Procedure: right TKA Date of procedure: 07/25/21  CHA2DS2-VASc Score = 3  This indicates a 3.2% annual risk of stroke. The patient's score is based upon: CHF History: Yes HTN History: No Diabetes History: No Stroke History: No Vascular Disease History: No Age Score: 1 Gender Score: 1    CrCl 105mL/min using adjusted body weight due to obesity Platelet count 203K  Per office protocol, patient can hold Eliquis for 3 days prior to procedure.

## 2021-05-08 NOTE — Telephone Encounter (Signed)
Received patient's application for Eliquis assistance. Printed out demographics and medication list with allergies listed to fax along with application.   Called patient and informed her that I have received her application and faxed it along with the printed information mentioned above. She thanked me for calling.

## 2021-05-10 ENCOUNTER — Telehealth: Payer: Self-pay

## 2021-05-10 NOTE — Telephone Encounter (Signed)
While on the phone with patient informing her of new information needed by the [employer] she stated that she is low on Eliquis that she has only a couple of days left. I informed her that I will place at least 2 sample boxes of Eliqis 5 mg at the front desk for her. She thanked me and stated that her granddaughter is due to be back in town tomorrow Friday 05/11/21. Placed samples at the front desk in file cabinet.   Medication Samples have been provided to the patient.  Drug name: Eliquis (Apixaban)     Strength: 5 mg      Qty: 28 tablets (2 boxes of 14 tablets)  LOT: [id number]  Exp.Date: 04/2023  Dosing instructions: Take 1 tablet (5 mg) by mouth 2 times a day   The patient has been instructed regarding the correct time, dose, and frequency of taking this medication, including desired effects and most common side effects.   Patient verbalized understanding and all (if any) questions were answered.   Dorris Fetch 10:45 AM 05/10/2021

## 2021-05-10 NOTE — Telephone Encounter (Signed)
Called patient and informed her that I heard back from the foundation and that they are requesting an out of pocket expense report based on her household adjusted income. I informed her that the report needs to she that $595.39 has been spent on medications for her and household members and that she can go to her local pharmacy ask to have the printout. She stated that her granddaughter is out of town and is do back tomorrow Friday 05/11/21 to take her to do that. [name] also informed me that she is low on her Eliquis and asked if there was anything we could do. I informed her that I can put some samples at the front desk for her if we have them in stock. She thank me and stated that she will be waiting for my call.

## 2021-05-21 ENCOUNTER — Telehealth: Payer: Self-pay | Admitting: Physician Assistant

## 2021-05-21 NOTE — Telephone Encounter (Signed)
*  STAT* If patient is at the pharmacy, call can be transferred to refill team.   1. Which medications need to be refilled? (please list name of each medication and dose if known)  albuterol (VENTOLIN HFA) 108 (90 Base) MCG/ACT inhaler  2. Which pharmacy/location (including street and city if local pharmacy) is medication to be sent to? [id number] - [place], [place] - [address]  3. Do they need a 30 day or 90 day supply? 30

## 2021-05-22 ENCOUNTER — Telehealth: Payer: Self-pay | Admitting: Physician Assistant

## 2021-05-22 NOTE — Telephone Encounter (Signed)
Pt c/o Shortness Of Breath: STAT if SOB developed within the last 24 hours or pt is noticeably SOB on the phone  1. Are you currently SOB (can you hear that pt is SOB on the phone)?  Yes   2. How long have you been experiencing SOB?  Patient states her  SOB is ongoing, but her albuterol inhaler normally helps to regulate it. However, she is completely out and awaiting a refill  3. Are you SOB when sitting or when up moving around?  Both   4. Are you currently experiencing any other symptoms?  No

## 2021-05-22 NOTE — Telephone Encounter (Signed)
Spoke to pt. She report since discharge,she has been doing well and has had no issues. However, she report she recently ran out of her inhaler and notice she is becoming sob. She denies any other symptoms and state she need a refill.  Nurse encouraged pt to contact PCP. Pt verbalized understanding.

## 2021-05-22 NOTE — Telephone Encounter (Signed)
Unable to transfer patient. Tried calling triage 3x. Phone rang for 3 minutes each time then got fast busy dial tone.  Please return call when able.

## 2021-05-23 NOTE — Telephone Encounter (Signed)
Ideally, albuterol should be prescribed by PCP or pulmonology service

## 2021-05-28 NOTE — Telephone Encounter (Signed)
Called and spoke with the patient and explained that the inhaler is not a cardiac medication and that she would need to reach out to her PCP or Pulmonologist. [name] stated that her PCP as taken care of the refill. She thanked me for calling.

## 2021-05-30 ENCOUNTER — Telehealth: Payer: Self-pay | Admitting: Physician Assistant

## 2021-05-30 NOTE — Telephone Encounter (Signed)
  Pt c/o medication issue:  1. Name of Medication: Eliquis  2. How are you currently taking this medication (dosage and times per day)?   3. Are you having a reaction (difficulty breathing--STAT)?   4. What is your medication issue? Pt requesting to speak with Gaspar Cola about her eliquis, she said to call her on this number [phone]

## 2021-05-31 MED ORDER — RIVAROXABAN 20 MG PO TABS: 20.0000 mg | ORAL_TABLET | Freq: Every day | ORAL | 1 refills | Status: DC

## 2021-05-31 NOTE — Telephone Encounter (Signed)
Pt is returning call in regards to Eliquis. Pt is requesting to speak with Gaspar Cola

## 2021-05-31 NOTE — Telephone Encounter (Signed)
I spoke with the patient, the current Eliquis is too costly for her.  Based on her renal function, I will switch her to Xarelto 20 mg daily.  She is aware to take Xarelto around 6 PM every day along with her dinner.  If Xarelto remains too costly and without option of medication assistance, the only remaining option would be Coumadin therapy.

## 2021-05-31 NOTE — Telephone Encounter (Signed)
Pt is calling to speak w/ Terrah to provide additional info shes received from the company... please advise

## 2021-05-31 NOTE — Telephone Encounter (Signed)
Returned call to patient to inform her that I spoke with a representative from the foundation for her Eliquis. I informed her that her application status is still pending due to her put of pocket expense report shows that she needs to sow an additional amount of $125 or to mail/fax a copy of her 2021 tax return. Patient stated that she believes her tax return maybe in the [place] and that she as not been able to spend $125 more for her medications. She also stated that after today she will not have anymore Eliquis and she needed to know what to do. I informed her that I will pass this along to Azalee Course, PA-C and that he will speak with her about possibly giving her more samples or changing her medication. She thanked me for calling her back. I informed Wynema Birch what is going on and he spoke with patient.   Azalee Course, PA-C informed me that he is changing patient's medication to Xarelto 20 mg daily and asked if I could get patient a coupon for a 30 supply. I called patient back with the number for her to text the word "VOUCHER" to [phone] or to call [phone] to get set up for the 30 day supply for Xarelto 20 mg daily. I sent new Rx for Xarelto 20 mg daily to patient's pharmacy on file and informed her if she has any issues with setting the coupon up to give me a call back.

## 2021-06-05 ENCOUNTER — Telehealth: Payer: Self-pay | Admitting: Physician Assistant

## 2021-06-05 ENCOUNTER — Other Ambulatory Visit: Payer: Self-pay | Admitting: Physician Assistant

## 2021-06-05 NOTE — Telephone Encounter (Addendum)
   Name: Lisaanne Lawrie Block  DOB: [birth year]  MRN: [id number]   Primary Cardiologist: Chrystie Nose, MD  Chart reviewed as part of pre-operative protocol coverage. Patient was contacted 06/05/2021 in reference to pre-operative risk assessment for pending surgery as outlined below.  Devorah Givhan Block was last seen on 04/19/2021 by Azalee Course PA-C.  Since that day, Marva Hendryx has done well without chest pain or worsening dyspnea. She does not have prior history of CAD, however she does have severe COPD on home O2.   Therefore, based on ACC/AHA guidelines, the patient would be at acceptable risk from cardiac perspective for the planned procedure without further cardiovascular testing. Her breathing is a concern during surgery especially if general anesthesia is required. Will defer to the surgeon's office to consider clearance from PCP or pulmonology.   Patient will need to hold Xarelto for 3 days prior to procedure.  The patient was advised that if she develops new symptoms prior to surgery to contact our office to arrange for a follow-up visit, and she verbalized understanding.  I will route this recommendation to the requesting party via Epic fax function and remove from pre-op pool. Please call with questions.  [place], [place] 06/05/2021, 5:59 PM

## 2021-06-05 NOTE — Telephone Encounter (Signed)
Patient was switched to Xarelto due to cost, clinical pharmacist to see if Xarelto will need to be held for 3 days as well prior to surgery.

## 2021-06-05 NOTE — Telephone Encounter (Signed)
Patient states that she came by the office this morning regarding her preop clearance, she states that she received a call from this office and assumes it is in regards to that. I do not see any documentation stating that anyone from this office has called her. She also states that her surgeon's office is wanting to follow up on her preop clearance as they have not heard anything about it yet.

## 2021-06-05 NOTE — Telephone Encounter (Signed)
Spoke to patient she stated she is scheduled to have a right knee replacement 10/19.Advised we have not received a surgical clearance. Spoke to [facility] surgical clearance requested.

## 2021-06-08 NOTE — Telephone Encounter (Signed)
Patient will need to hold Xarelto for 3 days prior to procedure.

## 2021-06-19 NOTE — Progress Notes (Addendum)
Anesthesia Review:  PCP: [facility]  Cardiologist : DR Zoila Shutter  04/19/21 hao meng, PA LOV  PT has COPD, smoker,  uses oxygen prn " if afib acts up"  Clearance 06/05/21 on chart  DR Zoila Shutter  Chest x-ray : 2V - 02/01/21  EKG : 04/19/21  Echo : 07/05/20  Stress test: Cardiac Cath :  Activity level:  can do a flight of stairs without difficulty  Sleep Study/ CPAP : none  Fasting Blood Sugar :      / Checks Blood Sugar -- times a day:   Blood Thinner/ Instructions /Last Dose: ASA / Instructions/ Last Dose :   Xarelto - at preop pt does not have preop instructions regarding Xarelto.  Instructed pt to call cardiology and /or Dr Lequita Halt to obtain instructions.  PT voiced understanding.  Covid test on 06/25/21

## 2021-06-19 NOTE — Progress Notes (Signed)
DUE TO COVID-19 ONLY ONE VISITOR IS ALLOWED TO COME WITH YOU AND STAY IN THE WAITING ROOM ONLY DURING PRE OP AND PROCEDURE DAY OF SURGERY.  2 VISITOR  MAY VISIT WITH YOU AFTER SURGERY IN YOUR PRIVATE ROOM DURING VISITING HOURS ONLY!  YOU NEED TO HAVE A COVID 19 TEST ON_10/17/2022 _____AME@_  @_from  8am-3pm _____, THIS TEST MUST BE DONE BEFORE SURGERY,  Covid test is done at [address] [place], [place] [address].  This is a drive thru.  No appt required. Please see map.                 Your procedure is scheduled on:  06/27/2021   Report to [facility] Main  Entrance   Report to admitting at    0545 AM     Call this number if you have problems the morning of surgery [phone]    REMEMBER: NO  SOLID FOOD CANDY OR GUM AFTER MIDNIGHT. CLEAR LIQUIDS UNTIL    0530am        . NOTHING BY MOUTH EXCEPT CLEAR LIQUIDS UNTIL  0530am   . PLEASE FINISH ENSURE DRINK PER SURGEON ORDER  WHICH NEEDS TO BE COMPLETED AT   0530am    .      CLEAR LIQUID DIET   Foods Allowed                                                                    Coffee and tea, regular and decaf                            Fruit ices (not with fruit pulp)                                      Iced Popsicles                                    Carbonated beverages, regular and diet                                    Cranberry, grape and apple juices Sports drinks like Gatorade Lightly seasoned clear broth or consume(fat free) Sugar, honey syrup ___________________________________________________________________      BRUSH YOUR TEETH MORNING OF SURGERY AND RINSE YOUR MOUTH OUT, NO CHEWING GUM CANDY OR MINTS.     Take these medicines the morning of surgery with A SIP OF WATER:  Inhalers as needed and bring, cardizem   DO NOT TAKE ANY DIABETIC MEDICATIONS DAY OF YOUR SURGERY                               You may not have any metal on your body including hair pins and              piercings  Do not wear  jewelry, make-up, lotions, powders or perfumes, deodorant  Do not wear nail polish on your fingernails.  Do not shave  48 hours prior to surgery.              Men may shave face and neck.   Do not bring valuables to the hospital. [place].  Contacts, dentures or bridgework may not be worn into surgery.  Leave suitcase in the car. After surgery it may be brought to your room.     Patients discharged the day of surgery will not be allowed to drive home. IF YOU ARE HAVING SURGERY AND GOING HOME THE SAME DAY, YOU MUST HAVE AN ADULT TO DRIVE YOU HOME AND BE WITH YOU FOR 24 HOURS. YOU MAY GO HOME BY TAXI OR UBER OR ORTHERWISE, BUT AN ADULT MUST ACCOMPANY YOU HOME AND STAY WITH YOU FOR 24 HOURS.  Name and phone number of your driver:  Special Instructions: N/A              Please read over the following fact sheets you were given: _____________________________________________________________________  [facility] - Preparing for Surgery Before surgery, you can play an important role.  Because skin is not sterile, your skin needs to be as free of germs as possible.  You can reduce the number of germs on your skin by washing with CHG (chlorahexidine gluconate) soap before surgery.  CHG is an antiseptic cleaner which kills germs and bonds with the skin to continue killing germs even after washing. Please DO NOT use if you have an allergy to CHG or antibacterial soaps.  If your skin becomes reddened/irritated stop using the CHG and inform your nurse when you arrive at Short Stay. Do not shave (including legs and underarms) for at least 48 hours prior to the first CHG shower.  You may shave your face/neck. Please follow these instructions carefully:  1.  Shower with CHG Soap the night before surgery and the  morning of Surgery.  2.  If you choose to wash your hair, wash your hair first as usual with your  normal  shampoo.  3.  After you shampoo,  rinse your hair and body thoroughly to remove the  shampoo.                           4.  Use CHG as you would any other liquid soap.  You can apply chg directly  to the skin and wash                       Gently with a scrungie or clean washcloth.  5.  Apply the CHG Soap to your body ONLY FROM THE NECK DOWN.   Do not use on face/ open                           Wound or open sores. Avoid contact with eyes, ears mouth and genitals (private parts).                       Wash face,  Genitals (private parts) with your normal soap.             6.  Wash thoroughly, paying special attention to the area where your surgery  will be performed.  7.  Thoroughly rinse your body with  warm water from the neck down.  8.  DO NOT shower/wash with your normal soap after using and rinsing off  the CHG Soap.                9.  Pat yourself dry with a clean towel.            10.  Wear clean pajamas.            11.  Place clean sheets on your bed the night of your first shower and do not  sleep with pets. Day of Surgery : Do not apply any lotions/deodorants the morning of surgery.  Please wear clean clothes to the hospital/surgery center.  FAILURE TO FOLLOW THESE INSTRUCTIONS MAY RESULT IN THE CANCELLATION OF YOUR SURGERY PATIENT SIGNATURE_________________________________  NURSE SIGNATURE__________________________________  ________________________________________________________________________

## 2021-06-20 ENCOUNTER — Encounter (HOSPITAL_COMMUNITY): Payer: Self-pay

## 2021-06-20 ENCOUNTER — Encounter (HOSPITAL_COMMUNITY)
Admission: RE | Admit: 2021-06-20 | Discharge: 2021-06-20 | Disposition: A | Discharge status: Home / Self Care | Payer: Medicare HMO | Source: Ambulatory Visit | Attending: Orthopedic Surgery | Admitting: Orthopedic Surgery

## 2021-06-20 ENCOUNTER — Other Ambulatory Visit: Payer: Self-pay

## 2021-06-20 DIAGNOSIS — I4891 Unspecified atrial fibrillation: Secondary | ICD-10-CM | POA: Insufficient documentation

## 2021-06-20 DIAGNOSIS — I358 Other nonrheumatic aortic valve disorders: Secondary | ICD-10-CM | POA: Insufficient documentation

## 2021-06-20 DIAGNOSIS — Z79899 Other long term (current) drug therapy: Secondary | ICD-10-CM | POA: Diagnosis not present

## 2021-06-20 DIAGNOSIS — F17210 Nicotine dependence, cigarettes, uncomplicated: Secondary | ICD-10-CM | POA: Insufficient documentation

## 2021-06-20 DIAGNOSIS — Z01812 Encounter for preprocedural laboratory examination: Secondary | ICD-10-CM | POA: Insufficient documentation

## 2021-06-20 DIAGNOSIS — Z7901 Long term (current) use of anticoagulants: Secondary | ICD-10-CM | POA: Insufficient documentation

## 2021-06-20 DIAGNOSIS — Z96651 Presence of right artificial knee joint: Secondary | ICD-10-CM | POA: Insufficient documentation

## 2021-06-20 DIAGNOSIS — J449 Chronic obstructive pulmonary disease, unspecified: Secondary | ICD-10-CM | POA: Insufficient documentation

## 2021-06-20 DIAGNOSIS — I5020 Unspecified systolic (congestive) heart failure: Secondary | ICD-10-CM | POA: Diagnosis not present

## 2021-06-20 DIAGNOSIS — R0600 Dyspnea, unspecified: Secondary | ICD-10-CM | POA: Insufficient documentation

## 2021-06-20 DIAGNOSIS — T84032A Mechanical loosening of internal right knee prosthetic joint, initial encounter: Secondary | ICD-10-CM | POA: Insufficient documentation

## 2021-06-20 HISTORY — DX: Unspecified osteoarthritis, unspecified site: M19.90

## 2021-06-20 HISTORY — DX: Anxiety disorder, unspecified: F41.9

## 2021-06-20 HISTORY — DX: Pneumonia, unspecified organism: J18.9

## 2021-06-20 HISTORY — DX: Dyspnea, unspecified: R06.00

## 2021-06-20 HISTORY — DX: Other complications of anesthesia, initial encounter: T88.59XA

## 2021-06-20 LAB — CBC
HCT: 46.1 % — ABNORMAL HIGH (ref 36.0–46.0)
Hemoglobin: 14.7 g/dL (ref 12.0–15.0)
MCH: 32.7 pg (ref 26.0–34.0)
MCHC: 31.9 g/dL (ref 30.0–36.0)
MCV: 102.4 fL — ABNORMAL HIGH (ref 80.0–100.0)
Platelets: 169 10*3/uL (ref 150–400)
RBC: 4.5 MIL/uL (ref 3.87–5.11)
RDW: 13.5 % (ref 11.5–15.5)
WBC: 9.1 10*3/uL (ref 4.0–10.5)
nRBC: 0 % (ref 0.0–0.2)

## 2021-06-20 LAB — COMPREHENSIVE METABOLIC PANEL
ALT: 10 U/L (ref 0–44)
AST: 16 U/L (ref 15–41)
Albumin: 4 g/dL (ref 3.5–5.0)
Alkaline Phosphatase: 66 U/L (ref 38–126)
Anion gap: 9 (ref 5–15)
BUN: 14 mg/dL (ref 8–23)
CO2: 26 mmol/L (ref 22–32)
Calcium: 9.5 mg/dL (ref 8.9–10.3)
Chloride: 107 mmol/L (ref 98–111)
Creatinine, Ser: 0.94 mg/dL (ref 0.44–1.00)
GFR, Estimated: >60 mL/min (ref >60)
Glucose, Bld: 102 mg/dL — ABNORMAL HIGH (ref 70–99)
Potassium: 4.5 mmol/L (ref 3.5–5.1)
Sodium: 142 mmol/L (ref 135–145)
Total Bilirubin: 0.9 mg/dL (ref 0.3–1.2)
Total Protein: 7.5 g/dL (ref 6.5–8.1)

## 2021-06-20 LAB — SURGICAL PCR SCREEN
MRSA, PCR: NEGATIVE
Staphylococcus aureus: NEGATIVE

## 2021-06-21 NOTE — Progress Notes (Signed)
Anesthesia Chart Review   Case: [id number] Date/Time: 06/27/21 0815   Procedure: TOTAL KNEE REVISION (Right: Knee)   Anesthesia type: Choice   Pre-op diagnosis: Aseptic loosening right total knee arthroplasty   Location: [facility] ROOM 10 / [facility]   Surgeons: Ollen Gross, MD       DISCUSSION:75 y.o. every day smoker with h/o COPD with prn O2 use (stable at PAT visit), CHF, atrial fibrillation (on Xarelto), loosening of right total knee arthroplasty scheduled for above procedure 06/27/2021 with Dr. Ollen Gross.   Per cardiology preoperative evaluation 06/05/2021, "Chart reviewed as part of pre-operative protocol coverage. Patient was contacted 06/05/2021 in reference to pre-operative risk assessment for pending surgery as outlined below.  Courney Garrod Block was last seen on 04/19/2021 by Azalee Course PA-C.  Since that day, Anastasia Tompson has done well without chest pain or worsening dyspnea. She does not have prior history of CAD, however she does have severe COPD on home O2.    Therefore, based on ACC/AHA guidelines, the patient would be at acceptable risk from cardiac perspective for the planned procedure without further cardiovascular testing. Her breathing is a concern during surgery especially if general anesthesia is required. Will defer to the surgeon's office to consider clearance from PCP or pulmonology.    Patient will need to hold Xarelto for 3 days prior to procedure."  Anticipate pt can proceed with planned procedure barring acute status change.   VS: BP (!) 102/91   Pulse 98   Temp 36.7 C (Oral)   Resp 16   Ht 5\' 5"  (1.651 m)   Wt 108.4 kg   SpO2 95%   BMI 39.77 kg/m   PROVIDERS: , MD is PCP   Ellyn Hack, MD is Cardiologist  LABS: Labs reviewed: Acceptable for surgery. (all labs ordered are listed, but only abnormal results are displayed)  Labs Reviewed  CBC - Abnormal; Notable for the following components:      Result Value   HCT 46.1 (*)    MCV  102.4 (*)    All other components within normal limits  COMPREHENSIVE METABOLIC PANEL - Abnormal; Notable for the following components:   Glucose, Bld 102 (*)    All other components within normal limits  SURGICAL PCR SCREEN     IMAGES:   EKG: 04/19/2021 Rate 76 bpm  Atrial fibrillation  Possible anterior infarct, age undetermined   CV: Echo 07/05/2020  1. Left ventricular ejection fraction, by estimation, is 55 to 60%. The  left ventricle has normal function. The left ventricle has no regional  wall motion abnormalities. There is mild concentric left ventricular  hypertrophy. Left ventricular diastolic  function could not be evaluated.   2. Right ventricular systolic function is mildly reduced. The right  ventricular size is mildly enlarged. There is mildly elevated pulmonary  artery systolic pressure. The estimated right ventricular systolic  pressure is 37.2 mmHg.   3. Left atrial size was severely dilated.   4. Right atrial size was severely dilated.   5. A small pericardial effusion is present.   6. The mitral valve is normal in structure. Trivial mitral valve  regurgitation. No evidence of mitral stenosis.   7. The aortic valve is tricuspid. There is mild calcification of the  aortic valve. Aortic valve regurgitation is not visualized. Mild aortic  valve sclerosis is present, with no evidence of aortic valve stenosis.   8. The inferior vena cava is dilated in size with >50% respiratory  variability, suggesting right atrial pressure of 8 mmHg. Past Medical History:  Diagnosis Date   Anxiety    Arthritis    Atrial fibrillation (HCC)    Complication of anesthesia    used to get migraine headaches after surgery   Congestive heart failure (HCC)    COPD (chronic obstructive pulmonary disease) (HCC)    Dyspnea    if afib causes a problem   Pneumonia     Past Surgical History:  Procedure Laterality Date   BACK SURGERY     CHOLECYSTECTOMY     NECK SURGERY      REPLACEMENT TOTAL KNEE Right    TUBAL LIGATION      MEDICATIONS:  albuterol (VENTOLIN HFA) 108 (90 Base) MCG/ACT inhaler   diltiazem (CARDIZEM CD) 180 MG 24 hr capsule   furosemide (LASIX) 40 MG tablet   loratadine (CLARITIN) 10 MG tablet   rivaroxaban (XARELTO) 20 MG TABS tablet   No current facility-administered medications for this encounter.     Jodell Cipro Ward, PA-C [facility] Pre-Surgical Testing [phone]

## 2021-06-22 ENCOUNTER — Ambulatory Visit
Admission: RE | Admit: 2021-06-22 | Discharge: 2021-06-22 | Disposition: A | Discharge status: Home / Self Care | Payer: Medicare HMO | Source: Ambulatory Visit | Attending: Family Medicine | Admitting: Family Medicine

## 2021-06-22 ENCOUNTER — Other Ambulatory Visit: Payer: Self-pay | Admitting: Family Medicine

## 2021-06-22 DIAGNOSIS — J449 Chronic obstructive pulmonary disease, unspecified: Secondary | ICD-10-CM

## 2021-06-27 ENCOUNTER — Encounter (HOSPITAL_COMMUNITY)
Admission: RE | Disposition: A | Discharge status: Skilled Nursing Facility | Payer: Self-pay | Source: Home / Self Care | Attending: Orthopedic Surgery

## 2021-06-27 ENCOUNTER — Encounter (HOSPITAL_COMMUNITY): Payer: Self-pay | Admitting: Orthopedic Surgery

## 2021-06-27 ENCOUNTER — Other Ambulatory Visit: Payer: Self-pay

## 2021-06-27 ENCOUNTER — Inpatient Hospital Stay (HOSPITAL_COMMUNITY): Payer: Medicare HMO | Admitting: Physician Assistant

## 2021-06-27 ENCOUNTER — Inpatient Hospital Stay (HOSPITAL_COMMUNITY): Payer: Medicare HMO | Admitting: Certified Registered Nurse Anesthetist

## 2021-06-27 ENCOUNTER — Inpatient Hospital Stay (HOSPITAL_COMMUNITY)
Admission: RE | Admit: 2021-06-27 | Discharge: 2021-07-06 | DRG: 466 | Disposition: A | Discharge status: Skilled Nursing Facility | Payer: Medicare HMO | Attending: Orthopedic Surgery | Admitting: Orthopedic Surgery

## 2021-06-27 DIAGNOSIS — T486X5A Adverse effect of antiasthmatics, initial encounter: Secondary | ICD-10-CM | POA: Diagnosis not present

## 2021-06-27 DIAGNOSIS — Z880 Allergy status to penicillin: Secondary | ICD-10-CM | POA: Diagnosis not present

## 2021-06-27 DIAGNOSIS — I5032 Chronic diastolic (congestive) heart failure: Secondary | ICD-10-CM | POA: Diagnosis present

## 2021-06-27 DIAGNOSIS — F17210 Nicotine dependence, cigarettes, uncomplicated: Secondary | ICD-10-CM | POA: Diagnosis present

## 2021-06-27 DIAGNOSIS — L89891 Pressure ulcer of other site, stage 1: Secondary | ICD-10-CM | POA: Diagnosis present

## 2021-06-27 DIAGNOSIS — L8990 Pressure ulcer of unspecified site, unspecified stage: Secondary | ICD-10-CM | POA: Diagnosis present

## 2021-06-27 DIAGNOSIS — T84018A Broken internal joint prosthesis, other site, initial encounter: Secondary | ICD-10-CM

## 2021-06-27 DIAGNOSIS — J9622 Acute and chronic respiratory failure with hypercapnia: Secondary | ICD-10-CM | POA: Diagnosis not present

## 2021-06-27 DIAGNOSIS — Z7901 Long term (current) use of anticoagulants: Secondary | ICD-10-CM | POA: Diagnosis not present

## 2021-06-27 DIAGNOSIS — Z96659 Presence of unspecified artificial knee joint: Secondary | ICD-10-CM | POA: Diagnosis not present

## 2021-06-27 DIAGNOSIS — J441 Chronic obstructive pulmonary disease with (acute) exacerbation: Secondary | ICD-10-CM | POA: Diagnosis not present

## 2021-06-27 DIAGNOSIS — Z96651 Presence of right artificial knee joint: Secondary | ICD-10-CM

## 2021-06-27 DIAGNOSIS — Z825 Family history of asthma and other chronic lower respiratory diseases: Secondary | ICD-10-CM

## 2021-06-27 DIAGNOSIS — Z9981 Dependence on supplemental oxygen: Secondary | ICD-10-CM

## 2021-06-27 DIAGNOSIS — I509 Heart failure, unspecified: Secondary | ICD-10-CM | POA: Diagnosis not present

## 2021-06-27 DIAGNOSIS — J9811 Atelectasis: Secondary | ICD-10-CM | POA: Diagnosis not present

## 2021-06-27 DIAGNOSIS — J9621 Acute and chronic respiratory failure with hypoxia: Secondary | ICD-10-CM | POA: Diagnosis not present

## 2021-06-27 DIAGNOSIS — Z79899 Other long term (current) drug therapy: Secondary | ICD-10-CM | POA: Diagnosis not present

## 2021-06-27 DIAGNOSIS — R0603 Acute respiratory distress: Secondary | ICD-10-CM

## 2021-06-27 DIAGNOSIS — Z6839 Body mass index (BMI) 39.0-39.9, adult: Secondary | ICD-10-CM

## 2021-06-27 DIAGNOSIS — Z20822 Contact with and (suspected) exposure to covid-19: Secondary | ICD-10-CM | POA: Diagnosis present

## 2021-06-27 DIAGNOSIS — E669 Obesity, unspecified: Secondary | ICD-10-CM | POA: Diagnosis present

## 2021-06-27 DIAGNOSIS — I4891 Unspecified atrial fibrillation: Secondary | ICD-10-CM | POA: Diagnosis present

## 2021-06-27 DIAGNOSIS — T84032A Mechanical loosening of internal right knee prosthetic joint, initial encounter: Principal | ICD-10-CM | POA: Diagnosis present

## 2021-06-27 DIAGNOSIS — Y792 Prosthetic and other implants, materials and accessory orthopedic devices associated with adverse incidents: Secondary | ICD-10-CM | POA: Diagnosis present

## 2021-06-27 DIAGNOSIS — Z91199 Patient's noncompliance with other medical treatment and regimen due to unspecified reason: Secondary | ICD-10-CM | POA: Diagnosis not present

## 2021-06-27 DIAGNOSIS — M1711 Unilateral primary osteoarthritis, right knee: Secondary | ICD-10-CM | POA: Diagnosis present

## 2021-06-27 DIAGNOSIS — I4821 Permanent atrial fibrillation: Secondary | ICD-10-CM | POA: Diagnosis present

## 2021-06-27 DIAGNOSIS — L89151 Pressure ulcer of sacral region, stage 1: Secondary | ICD-10-CM | POA: Diagnosis not present

## 2021-06-27 HISTORY — PX: TOTAL KNEE REVISION: SHX996

## 2021-06-27 LAB — SARS CORONAVIRUS 2 BY RT PCR (HOSPITAL ORDER, PERFORMED IN ~~LOC~~ HOSPITAL LAB): SARS Coronavirus 2: NEGATIVE

## 2021-06-27 SURGERY — TOTAL KNEE REVISION
Anesthesia: Spinal | Site: Knee | Laterality: Right

## 2021-06-27 MED ORDER — POLYETHYLENE GLYCOL 3350 17 G PO PACK
17.0000 g | PACK | Freq: Every day | ORAL | Status: DC | PRN
  Administered 2021-07-03 – 2021-07-06 (×2): 17 g via ORAL
  Filled 2021-06-27 (×2): qty 1

## 2021-06-27 MED ORDER — FENTANYL CITRATE PF 50 MCG/ML IJ SOSY
PREFILLED_SYRINGE | INTRAMUSCULAR | Status: AC
  Administered 2021-06-27: 50 ug via INTRAVENOUS
  Filled 2021-06-27: qty 2

## 2021-06-27 MED ORDER — PHENYLEPHRINE 40 MCG/ML (10ML) SYRINGE FOR IV PUSH (FOR BLOOD PRESSURE SUPPORT)
PREFILLED_SYRINGE | INTRAVENOUS | Status: AC
  Filled 2021-06-27: qty 10

## 2021-06-27 MED ORDER — BUPIVACAINE LIPOSOME 1.3 % IJ SUSP
INTRAMUSCULAR | Status: DC | PRN
  Administered 2021-06-27: 20 mL

## 2021-06-27 MED ORDER — DILTIAZEM HCL ER COATED BEADS 180 MG PO CP24
180.0000 mg | ORAL_CAPSULE | Freq: Every day | ORAL | Status: DC
  Administered 2021-06-28 – 2021-07-06 (×9): 180 mg via ORAL
  Filled 2021-06-27 (×9): qty 1

## 2021-06-27 MED ORDER — FENTANYL CITRATE PF 50 MCG/ML IJ SOSY
50.0000 ug | PREFILLED_SYRINGE | INTRAMUSCULAR | Status: DC
  Administered 2021-06-27: 50 ug via INTRAVENOUS

## 2021-06-27 MED ORDER — LACTATED RINGERS IV SOLN: INTRAVENOUS | Status: DC

## 2021-06-27 MED ORDER — FLEET ENEMA 7-19 GM/118ML RE ENEM: 1.0000 | ENEMA | Freq: Once | RECTAL | Status: DC | PRN

## 2021-06-27 MED ORDER — PHENOL 1.4 % MT LIQD: 1.0000 | OROMUCOSAL | Status: DC | PRN

## 2021-06-27 MED ORDER — MIDAZOLAM HCL 2 MG/2ML IJ SOLN
1.0000 mg | INTRAMUSCULAR | Status: DC
  Administered 2021-06-27: 1 mg via INTRAVENOUS

## 2021-06-27 MED ORDER — ACETAMINOPHEN 10 MG/ML IV SOLN
1000.0000 mg | Freq: Four times a day (QID) | INTRAVENOUS | Status: DC
  Administered 2021-06-27: 1000 mg via INTRAVENOUS
  Filled 2021-06-27: qty 100

## 2021-06-27 MED ORDER — 0.9 % SODIUM CHLORIDE (POUR BTL) OPTIME
TOPICAL | Status: DC | PRN
  Administered 2021-06-27: 1000 mL

## 2021-06-27 MED ORDER — VANCOMYCIN HCL IN DEXTROSE 1-5 GM/200ML-% IV SOLN
1000.0000 mg | Freq: Two times a day (BID) | INTRAVENOUS | Status: AC
Start: 2021-06-27 — End: 2021-06-27
  Administered 2021-06-27: 1000 mg via INTRAVENOUS
  Filled 2021-06-27: qty 200

## 2021-06-27 MED ORDER — DOCUSATE SODIUM 100 MG PO CAPS
100.0000 mg | ORAL_CAPSULE | Freq: Two times a day (BID) | ORAL | Status: DC
  Administered 2021-06-27 – 2021-07-06 (×17): 100 mg via ORAL
  Filled 2021-06-27 (×18): qty 1

## 2021-06-27 MED ORDER — METOCLOPRAMIDE HCL 5 MG PO TABS: 5.0000 mg | ORAL_TABLET | Freq: Three times a day (TID) | ORAL | Status: DC | PRN

## 2021-06-27 MED ORDER — SODIUM CHLORIDE (PF) 0.9 % IJ SOLN
INTRAMUSCULAR | Status: DC | PRN
  Administered 2021-06-27: 60 mL

## 2021-06-27 MED ORDER — ONDANSETRON HCL 4 MG PO TABS: 4.0000 mg | ORAL_TABLET | Freq: Four times a day (QID) | ORAL | Status: DC | PRN

## 2021-06-27 MED ORDER — METHOCARBAMOL 500 MG PO TABS
500.0000 mg | ORAL_TABLET | Freq: Four times a day (QID) | ORAL | Status: DC | PRN
  Administered 2021-06-27 – 2021-07-06 (×11): 500 mg via ORAL
  Filled 2021-06-27 (×12): qty 1

## 2021-06-27 MED ORDER — FENTANYL CITRATE PF 50 MCG/ML IJ SOSY
25.0000 ug | PREFILLED_SYRINGE | INTRAMUSCULAR | Status: DC | PRN
  Administered 2021-06-27 (×2): 25 ug via INTRAVENOUS

## 2021-06-27 MED ORDER — PHENYLEPHRINE HCL (PRESSORS) 10 MG/ML IV SOLN
INTRAVENOUS | Status: AC
  Filled 2021-06-27: qty 2

## 2021-06-27 MED ORDER — BUPIVACAINE LIPOSOME 1.3 % IJ SUSP
INTRAMUSCULAR | Status: AC
  Filled 2021-06-27: qty 20

## 2021-06-27 MED ORDER — ONDANSETRON HCL 4 MG/2ML IJ SOLN: 4.0000 mg | Freq: Once | INTRAMUSCULAR | Status: DC | PRN

## 2021-06-27 MED ORDER — METHOCARBAMOL 500 MG IVPB - SIMPLE MED
500.0000 mg | Freq: Four times a day (QID) | INTRAVENOUS | Status: DC | PRN
  Filled 2021-06-27: qty 50

## 2021-06-27 MED ORDER — MENTHOL 3 MG MT LOZG: 1.0000 | LOZENGE | OROMUCOSAL | Status: DC | PRN

## 2021-06-27 MED ORDER — STERILE WATER FOR IRRIGATION IR SOLN
Status: DC | PRN
  Administered 2021-06-27: 2000 mL

## 2021-06-27 MED ORDER — SODIUM CHLORIDE (PF) 0.9 % IJ SOLN
INTRAMUSCULAR | Status: AC
  Filled 2021-06-27: qty 10

## 2021-06-27 MED ORDER — PROPOFOL 10 MG/ML IV BOLUS
INTRAVENOUS | Status: DC | PRN
  Administered 2021-06-27: 10 mg via INTRAVENOUS
  Administered 2021-06-27: 20 mg via INTRAVENOUS

## 2021-06-27 MED ORDER — DIPHENHYDRAMINE HCL 12.5 MG/5ML PO ELIX
12.5000 mg | ORAL_SOLUTION | ORAL | Status: DC | PRN
  Administered 2021-06-27: 25 mg via ORAL
  Administered 2021-06-28: 12.5 mg via ORAL
  Administered 2021-06-29 – 2021-07-04 (×2): 25 mg via ORAL
  Filled 2021-06-27: qty 10
  Filled 2021-06-27: qty 5
  Filled 2021-06-27 (×2): qty 10

## 2021-06-27 MED ORDER — BISACODYL 10 MG RE SUPP: 10.0000 mg | Freq: Every day | RECTAL | Status: DC | PRN

## 2021-06-27 MED ORDER — DEXAMETHASONE SODIUM PHOSPHATE 10 MG/ML IJ SOLN
10.0000 mg | Freq: Once | INTRAMUSCULAR | Status: AC
  Administered 2021-06-28: 10 mg via INTRAVENOUS
  Filled 2021-06-27: qty 1

## 2021-06-27 MED ORDER — VANCOMYCIN HCL IN DEXTROSE 1-5 GM/200ML-% IV SOLN
1000.0000 mg | Freq: Once | INTRAVENOUS | Status: AC
  Administered 2021-06-27: 1000 mg via INTRAVENOUS
  Filled 2021-06-27: qty 200

## 2021-06-27 MED ORDER — SODIUM CHLORIDE 0.9 % IR SOLN
Status: DC | PRN
  Administered 2021-06-27: 3000 mL

## 2021-06-27 MED ORDER — OXYCODONE HCL 5 MG PO TABS
5.0000 mg | ORAL_TABLET | ORAL | Status: DC | PRN
  Administered 2021-06-27 – 2021-07-02 (×28): 10 mg via ORAL
  Administered 2021-07-03 (×2): 5 mg via ORAL
  Administered 2021-07-04: 10 mg via ORAL
  Administered 2021-07-04 (×2): 5 mg via ORAL
  Administered 2021-07-05 – 2021-07-06 (×4): 10 mg via ORAL
  Administered 2021-07-06: 5 mg via ORAL
  Administered 2021-07-06: 10 mg via ORAL
  Filled 2021-06-27 (×11): qty 2
  Filled 2021-06-27 (×2): qty 1
  Filled 2021-06-27 (×16): qty 2
  Filled 2021-06-27: qty 1
  Filled 2021-06-27: qty 2
  Filled 2021-06-27: qty 1
  Filled 2021-06-27 (×6): qty 2
  Filled 2021-06-27: qty 1

## 2021-06-27 MED ORDER — MIDAZOLAM HCL 2 MG/2ML IJ SOLN
INTRAMUSCULAR | Status: AC
  Administered 2021-06-27: 1 mg via INTRAVENOUS
  Filled 2021-06-27: qty 2

## 2021-06-27 MED ORDER — ALBUTEROL SULFATE HFA 108 (90 BASE) MCG/ACT IN AERS: 2.0000 | INHALATION_SPRAY | Freq: Four times a day (QID) | RESPIRATORY_TRACT | Status: DC | PRN

## 2021-06-27 MED ORDER — CEFAZOLIN SODIUM-DEXTROSE 2-4 GM/100ML-% IV SOLN: 2.0000 g | Freq: Four times a day (QID) | INTRAVENOUS | Status: DC

## 2021-06-27 MED ORDER — FENTANYL CITRATE PF 50 MCG/ML IJ SOSY
PREFILLED_SYRINGE | INTRAMUSCULAR | Status: AC
  Administered 2021-06-27: 50 ug via INTRAVENOUS
  Filled 2021-06-27: qty 1

## 2021-06-27 MED ORDER — SODIUM CHLORIDE 0.9 % IV SOLN: INTRAVENOUS | Status: DC

## 2021-06-27 MED ORDER — BUPIVACAINE IN DEXTROSE 0.75-8.25 % IT SOLN
INTRATHECAL | Status: DC | PRN
  Administered 2021-06-27: 1.8 mL via INTRATHECAL

## 2021-06-27 MED ORDER — DEXAMETHASONE SODIUM PHOSPHATE 10 MG/ML IJ SOLN
8.0000 mg | Freq: Once | INTRAMUSCULAR | Status: AC
  Administered 2021-06-27: 8 mg via INTRAVENOUS

## 2021-06-27 MED ORDER — LORATADINE 10 MG PO TABS
10.0000 mg | ORAL_TABLET | Freq: Every day | ORAL | Status: DC | PRN
  Administered 2021-06-28: 10 mg via ORAL
  Filled 2021-06-27: qty 1

## 2021-06-27 MED ORDER — MORPHINE SULFATE (PF) 2 MG/ML IV SOLN
0.5000 mg | INTRAVENOUS | Status: DC | PRN
  Administered 2021-06-27 – 2021-06-30 (×3): 1 mg via INTRAVENOUS
  Filled 2021-06-27 (×3): qty 1

## 2021-06-27 MED ORDER — BUPIVACAINE LIPOSOME 1.3 % IJ SUSP: 20.0000 mL | Freq: Once | INTRAMUSCULAR | Status: DC

## 2021-06-27 MED ORDER — ONDANSETRON HCL 4 MG/2ML IJ SOLN
INTRAMUSCULAR | Status: DC | PRN
  Administered 2021-06-27: 4 mg via INTRAVENOUS

## 2021-06-27 MED ORDER — RIVAROXABAN 10 MG PO TABS
10.0000 mg | ORAL_TABLET | Freq: Every day | ORAL | Status: DC
  Administered 2021-06-28 – 2021-06-30 (×3): 10 mg via ORAL
  Filled 2021-06-27 (×3): qty 1

## 2021-06-27 MED ORDER — CLONIDINE HCL (ANALGESIA) 100 MCG/ML EP SOLN
EPIDURAL | Status: DC | PRN
  Administered 2021-06-27: 100 ug

## 2021-06-27 MED ORDER — TRANEXAMIC ACID-NACL 1000-0.7 MG/100ML-% IV SOLN
1000.0000 mg | INTRAVENOUS | Status: AC
  Administered 2021-06-27: 1000 mg via INTRAVENOUS
  Filled 2021-06-27: qty 100

## 2021-06-27 MED ORDER — ROPIVACAINE HCL 5 MG/ML IJ SOLN
INTRAMUSCULAR | Status: DC | PRN
  Administered 2021-06-27 (×6): 5 mL via PERINEURAL

## 2021-06-27 MED ORDER — FENTANYL CITRATE PF 50 MCG/ML IJ SOSY
PREFILLED_SYRINGE | INTRAMUSCULAR | Status: AC
  Filled 2021-06-27: qty 1

## 2021-06-27 MED ORDER — CHLORHEXIDINE GLUCONATE 0.12 % MT SOLN
15.0000 mL | Freq: Once | OROMUCOSAL | Status: AC
  Administered 2021-06-27: 15 mL via OROMUCOSAL

## 2021-06-27 MED ORDER — PROPOFOL 500 MG/50ML IV EMUL
INTRAVENOUS | Status: DC | PRN
  Administered 2021-06-27: 50 ug/kg/min via INTRAVENOUS

## 2021-06-27 MED ORDER — FUROSEMIDE 40 MG PO TABS
40.0000 mg | ORAL_TABLET | Freq: Every day | ORAL | Status: DC
  Administered 2021-06-27 – 2021-07-03 (×7): 40 mg via ORAL
  Filled 2021-06-27 (×7): qty 1

## 2021-06-27 MED ORDER — ALBUTEROL SULFATE (2.5 MG/3ML) 0.083% IN NEBU
2.5000 mg | INHALATION_SOLUTION | Freq: Four times a day (QID) | RESPIRATORY_TRACT | Status: DC | PRN
  Administered 2021-06-27 – 2021-06-30 (×4): 2.5 mg via RESPIRATORY_TRACT
  Filled 2021-06-27 (×4): qty 3

## 2021-06-27 MED ORDER — METOCLOPRAMIDE HCL 5 MG/ML IJ SOLN
5.0000 mg | Freq: Three times a day (TID) | INTRAMUSCULAR | Status: DC | PRN
Start: 2021-06-27 — End: 2021-07-06

## 2021-06-27 MED ORDER — ACETAMINOPHEN 10 MG/ML IV SOLN: 1000.0000 mg | Freq: Once | INTRAVENOUS | Status: DC | PRN

## 2021-06-27 MED ORDER — ACETAMINOPHEN 325 MG PO TABS
325.0000 mg | ORAL_TABLET | Freq: Four times a day (QID) | ORAL | Status: DC | PRN
  Administered 2021-07-01 – 2021-07-06 (×2): 650 mg via ORAL
  Filled 2021-06-27 (×2): qty 2

## 2021-06-27 MED ORDER — TRAMADOL HCL 50 MG PO TABS
50.0000 mg | ORAL_TABLET | Freq: Four times a day (QID) | ORAL | Status: DC | PRN
  Administered 2021-06-27 – 2021-06-28 (×2): 50 mg via ORAL
  Filled 2021-06-27 (×2): qty 1

## 2021-06-27 MED ORDER — ONDANSETRON HCL 4 MG/2ML IJ SOLN
INTRAMUSCULAR | Status: AC
  Filled 2021-06-27: qty 2

## 2021-06-27 MED ORDER — PHENYLEPHRINE 40 MCG/ML (10ML) SYRINGE FOR IV PUSH (FOR BLOOD PRESSURE SUPPORT)
PREFILLED_SYRINGE | INTRAVENOUS | Status: DC | PRN
  Administered 2021-06-27: 40 ug via INTRAVENOUS

## 2021-06-27 MED ORDER — PHENYLEPHRINE HCL-NACL 20-0.9 MG/250ML-% IV SOLN
INTRAVENOUS | Status: DC | PRN
  Administered 2021-06-27: 40 ug/min via INTRAVENOUS

## 2021-06-27 MED ORDER — DEXAMETHASONE SODIUM PHOSPHATE 10 MG/ML IJ SOLN
INTRAMUSCULAR | Status: DC | PRN
  Administered 2021-06-27: 10 mg via INTRAVENOUS

## 2021-06-27 MED ORDER — ORAL CARE MOUTH RINSE: 15.0000 mL | Freq: Once | OROMUCOSAL | Status: AC

## 2021-06-27 MED ORDER — DEXAMETHASONE SODIUM PHOSPHATE 10 MG/ML IJ SOLN
INTRAMUSCULAR | Status: AC
  Filled 2021-06-27: qty 1

## 2021-06-27 MED ORDER — POVIDONE-IODINE 10 % EX SWAB
2.0000 "application " | Freq: Once | CUTANEOUS | Status: AC
  Administered 2021-06-27: 2 via TOPICAL

## 2021-06-27 MED ORDER — ONDANSETRON HCL 4 MG/2ML IJ SOLN: 4.0000 mg | Freq: Four times a day (QID) | INTRAMUSCULAR | Status: DC | PRN

## 2021-06-27 SURGICAL SUPPLY — 70 items
AUG TIB ATTUNE UNV SZ5 6X5 (Joint) ×4 IMPLANT
AUGMENT DISTAL FEM SZ6 4 KNEE (Miscellaneous) ×4 IMPLANT
AUGMENT TIB ATTUNE UNV SZ5 6X5 (Joint) ×2 IMPLANT
BAG COUNTER SPONGE SURGICOUNT (BAG) ×2 IMPLANT
BAG DECANTER FOR FLEXI CONT (MISCELLANEOUS) ×2 IMPLANT
BAG ZIPLOCK 12X15 (MISCELLANEOUS) IMPLANT
BASE TIB KNEE REV RP ATUNE SZ6 (Knees) ×2 IMPLANT
BLADE SAG 18X100X1.27 (BLADE) ×2 IMPLANT
BLADE SAW SGTL 11.0X1.19X90.0M (BLADE) ×2 IMPLANT
BLADE SAW SGTL 81X20 HD (BLADE) ×2 IMPLANT
BLADE SURG SZ10 CARB STEEL (BLADE) ×4 IMPLANT
BNDG ELASTIC 6X5.8 VLCR STR LF (GAUZE/BANDAGES/DRESSINGS) ×2 IMPLANT
BONE CEMENT GENTAMICIN (Cement) ×6 IMPLANT
CEMENT BONE GENTAMICIN 40 (Cement) ×3 IMPLANT
CLOTH BEACON ORANGE TIMEOUT ST (SAFETY) IMPLANT
COVER SURGICAL LIGHT HANDLE (MISCELLANEOUS) ×2 IMPLANT
CUFF TOURN SGL QUICK 34 (TOURNIQUET CUFF) ×1
CUFF TRNQT CYL 34X4.125X (TOURNIQUET CUFF) ×1 IMPLANT
DECANTER SPIKE VIAL GLASS SM (MISCELLANEOUS) ×2 IMPLANT
DRAPE INCISE IOBAN 66X45 STRL (DRAPES) ×2 IMPLANT
DRAPE U-SHAPE 47X51 STRL (DRAPES) ×2 IMPLANT
DRSG ADAPTIC 3X8 NADH LF (GAUZE/BANDAGES/DRESSINGS) ×2 IMPLANT
DRSG AQUACEL AG ADV 3.5X10 (GAUZE/BANDAGES/DRESSINGS) ×2 IMPLANT
DRSG PAD ABDOMINAL 8X10 ST (GAUZE/BANDAGES/DRESSINGS) ×2 IMPLANT
DURAPREP 26ML APPLICATOR (WOUND CARE) ×2 IMPLANT
ELECT REM PT RETURN 15FT ADLT (MISCELLANEOUS) IMPLANT
EVACUATOR 1/8 PVC DRAIN (DRAIN) IMPLANT
FEM KNEE ATTUNE REV CRS SZ6 RT (Orthopedic Implant) ×2 IMPLANT
FEMORAL KNE ATTN REV CRS SZ6RT (Orthopedic Implant) ×1 IMPLANT
GAUZE SPONGE 4X4 12PLY STRL (GAUZE/BANDAGES/DRESSINGS) ×2 IMPLANT
GLOVE SRG 8 PF TXTR STRL LF DI (GLOVE) ×1 IMPLANT
GLOVE SURG ENC MOIS LTX SZ6.5 (GLOVE) IMPLANT
GLOVE SURG ENC MOIS LTX SZ8 (GLOVE) ×2 IMPLANT
GLOVE SURG UNDER POLY LF SZ7 (GLOVE) IMPLANT
GLOVE SURG UNDER POLY LF SZ8 (GLOVE) ×1
GLOVE SURG UNDER POLY LF SZ8.5 (GLOVE) ×2 IMPLANT
GOWN STRL REUS W/TWL LRG LVL3 (GOWN DISPOSABLE) ×2 IMPLANT
HANDPIECE INTERPULSE COAX TIP (DISPOSABLE) ×1
HOLDER FOLEY CATH W/STRAP (MISCELLANEOUS) ×2 IMPLANT
IMMOBILIZER KNEE 20 (SOFTGOODS) ×2
IMMOBILIZER KNEE 20 THIGH 36 (SOFTGOODS) ×1 IMPLANT
INSERT TIB CRS ATTUNE SZ6 18 (Insert) ×2 IMPLANT
MANIFOLD NEPTUNE II (INSTRUMENTS) ×2 IMPLANT
NS IRRIG 1000ML POUR BTL (IV SOLUTION) ×2 IMPLANT
PACK TOTAL KNEE CUSTOM (KITS) ×2 IMPLANT
PADDING CAST ABS 6INX4YD NS (CAST SUPPLIES) ×1
PADDING CAST ABS COTTON 6X4 NS (CAST SUPPLIES) ×1 IMPLANT
PADDING CAST COTTON 6X4 STRL (CAST SUPPLIES) ×4 IMPLANT
PROTECTOR NERVE ULNAR (MISCELLANEOUS) ×2 IMPLANT
RESTRICTOR CEMENT SZ 5 C-STEM (Cement) ×2 IMPLANT
REV ATTUNE STEM 14X30 KNEE (Orthopedic Implant) ×2 IMPLANT
SET HNDPC FAN SPRY TIP SCT (DISPOSABLE) ×1 IMPLANT
SLEEVE TIB CMT ATTUNE 29 (Sleeve) ×2 IMPLANT
STEM REV ATTUNE 14X30 KNEE (Orthopedic Implant) ×1 IMPLANT
STEM STR ATTUNE PF 24X60 (Knees) ×2 IMPLANT
STRIP CLOSURE SKIN 1/2X4 (GAUZE/BANDAGES/DRESSINGS) ×4 IMPLANT
SUT MNCRL AB 4-0 PS2 18 (SUTURE) ×2 IMPLANT
SUT STRATAFIX 0 PDS 27 VIOLET (SUTURE) ×2
SUT VIC AB 2-0 CT1 27 (SUTURE) ×3
SUT VIC AB 2-0 CT1 TAPERPNT 27 (SUTURE) ×3 IMPLANT
SUTURE STRATFX 0 PDS 27 VIOLET (SUTURE) ×1 IMPLANT
SWAB COLLECTION DEVICE MRSA (MISCELLANEOUS) IMPLANT
SWAB CULTURE ESWAB REG 1ML (MISCELLANEOUS) IMPLANT
SYR 50ML LL SCALE MARK (SYRINGE) ×4 IMPLANT
TOWER CARTRIDGE SMART MIX (DISPOSABLE) ×2 IMPLANT
TRAY FOLEY MTR SLVR 16FR STAT (SET/KITS/TRAYS/PACK) IMPLANT
TUBE KAMVAC SUCTION (TUBING) IMPLANT
TUBE SUCTION HIGH CAP CLEAR NV (SUCTIONS) ×2 IMPLANT
WATER STERILE IRR 1000ML POUR (IV SOLUTION) ×2 IMPLANT
WRAP KNEE MAXI GEL POST OP (GAUZE/BANDAGES/DRESSINGS) ×2 IMPLANT

## 2021-06-27 NOTE — Anesthesia Procedure Notes (Signed)
Spinal  Patient location during procedure: OR Start time: 06/27/2021 11:19 AM End time: 06/27/2021 11:21 AM Reason for block: surgical anesthesia Staffing Performed: anesthesiologist  Anesthesiologist: Leilani Able, MD Preanesthetic Checklist Completed: patient identified, IV checked, site marked, risks and benefits discussed, surgical consent, monitors and equipment checked, pre-op evaluation and timeout performed Spinal Block Patient position: sitting Prep: DuraPrep and site prepped and draped Patient monitoring: continuous pulse ox and blood pressure Approach: midline Location: L3-4 Injection technique: single-shot Needle Needle type: Pencan  Needle gauge: 24 G Needle length: 10 cm Needle insertion depth: 9 cm Assessment Sensory level: T8 Events: CSF return

## 2021-06-27 NOTE — H&P (Signed)
ADMISSION H&P  Patient is being admitted for right total knee arthroplasty revision.  Subjective:  Chief Complaint: Failed right total knee arthroplasty  HPI: Sherri Hart, 75 y.o. female presents for pre-operative visit in preparation for their right total knee arthroplasty revision, which is scheduled on 06/27/2021 with Dr. Lequita Halt at [facility] The patient has had symptoms in the right knee including instability which has impacted their quality of life and ability to do activities of daily living. The patient currently has a diagnosis of right knee primary osteoarthritis and has failed conservative treatments including activity modification. The patient has had no previous surgery on the right knee. The patient denies an active infection.  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   Atrial fibrillation with RVR (HCC) 02/01/2021   Acute diastolic (congestive) heart failure (HCC) 07/05/2020   Elevated troponin level not due myocardial infarction 07/04/2020   Hypomagnesemia 07/04/2020   Atrial fibrillation with rapid ventricular response (HCC) 07/04/2020   Acute cardiogenic pulmonary edema (HCC) 07/04/2020   COPD with acute exacerbation (HCC) 07/04/2020   Nicotine dependence, cigarettes, uncomplicated 07/04/2020    Past Medical History:  Diagnosis Date   Anxiety    Arthritis    Atrial fibrillation (HCC)    Complication of anesthesia    used to get migraine headaches after surgery   Congestive heart failure (HCC)    COPD (chronic obstructive pulmonary disease) (HCC)    Dyspnea    if afib causes a problem   Pneumonia     Past Surgical History:  Procedure Laterality Date   BACK SURGERY     CHOLECYSTECTOMY     NECK SURGERY     REPLACEMENT TOTAL KNEE Right    TUBAL LIGATION      Prior to Admission medications   Medication Sig Start Date End Date Taking? Authorizing Provider  albuterol (VENTOLIN HFA)  108 (90 Base) MCG/ACT inhaler Inhale 2 puffs into the lungs every 6 (six) hours as needed for wheezing or shortness of breath. 02/04/21  Yes Gonfa, Boyce Medici, MD  diltiazem (CARDIZEM CD) 180 MG 24 hr capsule Take 1 capsule (180 mg total) by mouth daily. 02/04/21  Yes Almon Hercules, MD  furosemide (LASIX) 40 MG tablet Take 1 tablet (40 mg total) by mouth daily. 02/04/21  Yes Almon Hercules, MD  loratadine (CLARITIN) 10 MG tablet Take 10 mg by mouth daily as needed for allergies.   Yes [provider]  rivaroxaban (XARELTO) 20 MG TABS tablet Take 1 tablet (20 mg total) by mouth daily with supper. 05/31/21  Yes Azalee Course, PA    Allergies  Allergen Reactions   Penicillins     Did it involve swelling of the face/tongue/throat, SOB, or low BP? Yes Did it involve sudden or severe rash/hives, skin peeling, or any reaction on the inside of your mouth or nose? N Did you need to seek medical attention at a hospital or doctor's office? Y When did it last happen? childhood      If all above answers are "NO", may proceed with cephalosporin use.     Social History   Socioeconomic History   Marital status: Widowed    Spouse name: Not on file   Number of children: Not on file   Years of education: Not on file   Highest education level: Not on file  Occupational History   Not on file  Tobacco Use   Smoking status:  Every Day    Packs/day: 0.50    Years: 53.00    Pack years: 26.50    Types: Cigarettes   Smokeless tobacco: Never  Vaping Use   Vaping Use: Never used  Substance and Sexual Activity   Alcohol use: Never   Drug use: Never   Sexual activity: Not on file  Other Topics Concern   Not on file  Social History Narrative   Not on file   Social Determinants of Health   Financial Resource Strain: Not on file  Food Insecurity: Not on file  Transportation Needs: No Transportation Needs   Lack of Transportation (Medical): No   Lack of Transportation (Non-Medical): No  Physical  Activity: Not on file  Stress: Not on file  Social Connections: Not on file  Intimate Partner Violence: Not on file    Tobacco Use: High Risk   Smoking Tobacco Use: Every Day   Smokeless Tobacco Use: Never   Social History   Substance and Sexual Activity  Alcohol Use Never    Family History  Problem Relation Age of Onset   COPD Other     Review of Systems  Constitutional:  Negative for chills and fever.  HENT:  Negative for congestion, sore throat and tinnitus.   Eyes:  Negative for double vision, photophobia and pain.  Respiratory:  Negative for cough, shortness of breath and wheezing.   Cardiovascular:  Negative for chest pain, palpitations and orthopnea.  Gastrointestinal:  Negative for heartburn, nausea and vomiting.  Genitourinary:  Negative for dysuria, frequency and urgency.  Musculoskeletal:  Positive for joint pain.  Neurological:  Negative for dizziness, weakness and headaches.   Objective:  Physical Exam: Well nourished and well developed.  General: Alert and oriented x3, cooperative and pleasant, no acute distress.  Head: normocephalic, atraumatic, neck supple.  Eyes: EOMI.  Respiratory: breath sounds clear in all fields, no wheezing, rales, or rhonchi. Cardiovascular: Regular rate and rhythm, no murmurs, gallops or rubs.  Abdomen: non-tender to palpation and soft, normoactive bowel sounds. Musculoskeletal:    Right Knee Exam:   Well-healed scar from previous arthroplasty.   No effusion present. Mild soft tissue swelling present.   Valgus inclination to the knee.   Fair amount of varus/valgus laxity in extension as well as some mild AP laxity in flexion.   No gross instability.   Calves soft and nontender. Motor function intact in LE. Strength 5/5 LE bilaterally. Neuro: Distal pulses 2+. Sensation to light touch intact in LE.  Vital signs in last 24 hours: Temp:  [98.3 F (36.8 C)] 98.3 F (36.8 C) (10/19 0941) Pulse Rate:  [70] 70 (10/19  0941) Resp:  [15] 15 (10/19 0941) BP: (159)/(87) 159/87 (10/19 0941) SpO2:  [95 %] 95 % (10/19 0941) Weight:  [108.4 kg] 108.4 kg (10/19 0944)  Imaging Review Radiographs - 3 views of the right knee from the [facility] system from the hospital demonstrate a well seated prosthesis. She has a valgus inclination to this prosthesis with overall alignment of approximately 7 to 10 degrees of valgus. There is no evidence of any definitive loosening. No evidence of any osteolysis. On the sunrise view, she does have some lateral tilt to her patella.    Assessment/Plan:  Failed right total knee arthroplasty  The treatment options including medical management, injection therapy arthroscopy and arthroplasty were discussed at length. The risks and benefits of total knee arthroplasty were presented and reviewed. The risks due to aseptic loosening, infection, stiffness, patella tracking problems, thromboembolic  complications and other imponderables were discussed. The patient acknowledged the explanation, agreed to proceed with the plan and consent was signed. Patient is being admitted for inpatient treatment for surgery, pain control, PT, OT, prophylactic antibiotics, VTE prophylaxis, progressive ambulation and ADLs and discharge planning. The patient is planning to be discharged  home .  Therapy Plans: [facility] Disposition: Home with Granddaughter Planned DVT Prophylaxis: Xarelto 20mg  (A-Fib) DME Needed:  PCP: (patient contacting for clearance) Cardiologist: [employer] (patient contacting for clearance) TXA: IV Allergies: PCN (Anaphylaxis) Anesthesia Concerns: Migraine postoperatively in the past after surgeries in the past BMI: 39.5 Last HgbA1c: N/A  Pharmacy: [facility] [address]  - Patient was instructed on what medications to stop prior to surgery. - Follow-up visit in 2 weeks with Dr. Azalee Course - Begin physical therapy following surgery - Pre-operative lab work as  pre-surgical testing - Prescriptions will be provided in hospital at time of discharge  Lequita Halt, PA-C Orthopedic Surgery [facility] [place]

## 2021-06-27 NOTE — Discharge Instructions (Addendum)
 Sherri Aluisio, MD Total Joint Specialist [facility] [address] [place], [place] [address] [phone]  POSTOPERATIVE DIRECTIONS  Knee Rehabilitation, Guidelines Following Surgery  Results after knee surgery are often greatly improved when you follow the exercise, range of motion and muscle strengthening exercises prescribed by your doctor. Safety measures are also important to protect the knee from further injury. If any of these exercises cause you to have increased pain or swelling in your knee joint, decrease the amount until you are comfortable again and slowly increase them. If you have problems or questions, call your caregiver or physical therapist for advice.   HOME CARE INSTRUCTIONS  Remove items at home which could result in a fall. This includes throw rugs or furniture in walking pathways.  ICE to the affected knee as much as tolerated. Icing helps control swelling. If the swelling is well controlled you will be more comfortable and rehab easier. Continue to use ice on the knee for pain and swelling from surgery. You may notice swelling that will progress down to the foot and ankle. This is normal after surgery. Elevate the leg when you are not up walking on it.    Continue to use the breathing machine which will help keep your temperature down. It is common for your temperature to cycle up and down following surgery, especially at night when you are not up moving around and exerting yourself. The breathing machine keeps your lungs expanded and your temperature down. Do not place pillow under the operative knee, focus on keeping the knee straight while resting  DIET You may resume your previous home diet once you are discharged from the hospital.  DRESSING / WOUND CARE / SHOWERING Keep your bulky bandage on for 2 days. On the third post-operative day you may remove the Ace bandage and gauze. There is a waterproof adhesive bandage on your skin  which will stay in place until your first follow-up appointment. Once you remove this you will not need to place another bandage You may begin showering 3 days following surgery, but do not submerge the incision under water.  ACTIVITY For the first 5 days, the key is rest and control of pain and swelling Do your home exercises twice a day starting on post-operative day 3. On the days you go to physical therapy, just do the home exercises once that day. You should rest, ice and elevate the leg for 50 minutes out of every hour. Get up and walk/stretch for 10 minutes per hour. After 5 days you can increase your activity slowly as tolerated. Walk with your walker as instructed. Use the walker until you are comfortable transitioning to a cane. Walk with the cane in the opposite hand of the operative leg. You may discontinue the cane once you are comfortable and walking steadily. Avoid periods of inactivity such as sitting longer than an hour when not asleep. This helps prevent blood clots.  You may discontinue the knee immobilizer once you are able to perform a straight leg raise while lying down. You may resume a sexual relationship in one month or when given the OK by your doctor.  You may return to work once you are cleared by your doctor.  Do not drive a car for 6 weeks or until released by your surgeon.  Do not drive while taking narcotics.  TED HOSE STOCKINGS Wear the elastic stockings on both legs for three weeks following surgery during the day. You may remove them   at night for sleeping.  WEIGHT BEARING Weight bearing as tolerated with assist device (walker, cane, etc) as directed, use it as long as suggested by your surgeon or therapist, typically at least 4-6 weeks.  POSTOPERATIVE CONSTIPATION PROTOCOL Constipation - defined medically as fewer than three stools per week and severe constipation as less than one stool per week.  One of the most common issues patients have following surgery  is constipation.  Even if you have a regular bowel pattern at home, your normal regimen is likely to be disrupted due to multiple reasons following surgery.  Combination of anesthesia, postoperative narcotics, change in appetite and fluid intake all can affect your bowels.  In order to avoid complications following surgery, here are some recommendations in order to help you during your recovery period.  Colace (docusate) - Pick up an over-the-counter form of Colace or another stool softener and take twice a day as long as you are requiring postoperative pain medications.  Take with a full glass of water daily.  If you experience loose stools or diarrhea, hold the colace until you stool forms back up. If your symptoms do not get better within 1 week or if they get worse, check with your doctor. Dulcolax (bisacodyl) - Pick up over-the-counter and take as directed by the product packaging as needed to assist with the movement of your bowels.  Take with a full glass of water.  Use this product as needed if not relieved by Colace only.  MiraLax (polyethylene glycol) - Pick up over-the-counter to have on hand. MiraLax is a solution that will increase the amount of water in your bowels to assist with bowel movements.  Take as directed and can mix with a glass of water, juice, soda, coffee, or tea. Take if you go more than two days without a movement. Do not use MiraLax more than once per day. Call your doctor if you are still constipated or irregular after using this medication for 7 days in a row.  If you continue to have problems with postoperative constipation, please contact the office for further assistance and recommendations.  If you experience "the worst abdominal pain ever" or develop nausea or vomiting, please contact the office immediatly for further recommendations for treatment.  ITCHING If you experience itching with your medications, try taking only a single pain pill, or even half a pain pill at a  time.  You can also use Benadryl over the counter for itching or also to help with sleep.   MEDICATIONS See your medication summary on the "After Visit Summary" that the nursing staff will review with you prior to discharge.  You may have some home medications which will be placed on hold until you complete the course of blood thinner medication.  It is important for you to complete the blood thinner medication as prescribed by your surgeon.  Continue your approved medications as instructed at time of discharge.  PRECAUTIONS If you experience chest pain or shortness of breath - call 911 immediately for transfer to the hospital emergency department.  If you develop a fever greater that 101 F, purulent drainage from wound, increased redness or drainage from wound, foul odor from the wound/dressing, or calf pain - CONTACT YOUR SURGEON.                                                     FOLLOW-UP APPOINTMENTS Make sure you keep all of your appointments after your operation with your surgeon and caregivers. You should call the office at the above phone number and make an appointment for approximately two weeks after the date of your surgery or on the date instructed by your surgeon outlined in the "After Visit Summary".  RANGE OF MOTION AND STRENGTHENING EXERCISES  Rehabilitation of the knee is important following a knee injury or an operation. After just a few days of immobilization, the muscles of the thigh which control the knee become weakened and shrink (atrophy). Knee exercises are designed to build up the tone and strength of the thigh muscles and to improve knee motion. Often times heat used for twenty to thirty minutes before working out will loosen up your tissues and help with improving the range of motion but do not use heat for the first two weeks following surgery. These exercises can be done on a training (exercise) mat, on the floor, on a table or on a bed. Use what ever works the best and is  most comfortable for you Knee exercises include:  Leg Lifts - While your knee is still immobilized in a splint or cast, you can do straight leg raises. Lift the leg to 60 degrees, hold for 3 sec, and slowly lower the leg. Repeat 10-20 times 2-3 times daily. Perform this exercise against resistance later as your knee gets better.  Quad and Hamstring Sets - Tighten up the muscle on the front of the thigh (Quad) and hold for 5-10 sec. Repeat this 10-20 times hourly. Hamstring sets are done by pushing the foot backward against an object and holding for 5-10 sec. Repeat as with quad sets.  Leg Slides: Lying on your back, slowly slide your foot toward your buttocks, bending your knee up off the floor (only go as far as is comfortable). Then slowly slide your foot back down until your leg is flat on the floor again. Angel Wings: Lying on your back spread your legs to the side as far apart as you can without causing discomfort.  A rehabilitation program following serious knee injuries can speed recovery and prevent re-injury in the future due to weakened muscles. Contact your doctor or a physical therapist for more information on knee rehabilitation.   POST-OPERATIVE OPIOID TAPER INSTRUCTIONS: It is important to wean off of your opioid medication as soon as possible. If you do not need pain medication after your surgery it is ok to stop day one. Opioids include: Codeine, Hydrocodone(Norco, Vicodin), Oxycodone(Percocet, oxycontin) and hydromorphone amongst others.  Long term and even short term use of opiods can cause: Increased pain response Dependence Constipation Depression Respiratory depression And more.  Withdrawal symptoms can include Flu like symptoms Nausea, vomiting And more Techniques to manage these symptoms Hydrate well Eat regular healthy meals Stay active Use relaxation techniques(deep breathing, meditating, yoga) Do Not substitute Alcohol to help with tapering If you have been on  opioids for less than two weeks and do not have pain than it is ok to stop all together.  Plan to wean off of opioids This plan should start within one week post op of your joint replacement. Maintain the same interval or time between taking each dose and first decrease the dose.  Cut the total daily intake of opioids by one tablet each day Next start to increase the time between doses. The last dose that should be eliminated is the evening dose.   IF YOU ARE TRANSFERRED TO   A SKILLED REHAB FACILITY If the patient is transferred to a skilled rehab facility following release from the hospital, a list of the current medications will be sent to the facility for the patient to continue.  When discharged from the skilled rehab facility, please have the facility set up the patient's Home Health Physical Therapy prior to being released. Also, the skilled facility will be responsible for providing the patient with their medications at time of release from the facility to include their pain medication, the muscle relaxants, and their blood thinner medication. If the patient is still at the rehab facility at time of the two week follow up appointment, the skilled rehab facility will also need to assist the patient in arranging follow up appointment in our office and any transportation needs.  MAKE SURE YOU:  Understand these instructions.  Get help right away if you are not doing well or get worse.   DENTAL ANTIBIOTICS:  In most cases prophylactic antibiotics for Dental procdeures after total joint surgery are not necessary.  Exceptions are as follows:  1. History of prior total joint infection  2. Severely immunocompromised (Organ Transplant, cancer chemotherapy, Rheumatoid biologic meds such as Humera)  3. Poorly controlled diabetes (A1C &gt; 8.0, blood glucose over 200)  If you have one of these conditions, contact your surgeon for an antibiotic prescription, prior to your dental procedure.     Pick up stool softner and laxative for home use following surgery while on pain medications. Do not submerge incision under water. Please use good hand washing techniques while changing dressing each day. May shower starting three days after surgery. Please use a clean towel to pat the incision dry following showers. Continue to use ice for pain and swelling after surgery. Do not use any lotions or creams on the incision until instructed by your surgeon.  

## 2021-06-27 NOTE — Plan of Care (Signed)
Discussed with patient about plan of care for post-op day 0. ° ° °Will continue to monitor patient.  ° ° °SWhittemore, RN ° °

## 2021-06-27 NOTE — Anesthesia Postprocedure Evaluation (Signed)
Anesthesia Post Note  Patient: Sherri Hart  Procedure(s) Performed: TOTAL KNEE REVISION (Right: Knee)     Patient location during evaluation: PACU Anesthesia Type: Spinal Level of consciousness: awake and sedated Pain management: pain level controlled Vital Signs Assessment: post-procedure vital signs reviewed and stable Respiratory status: spontaneous breathing Cardiovascular status: stable Postop Assessment: no headache, no backache, spinal receding, patient able to bend at knees and no apparent nausea or vomiting Anesthetic complications: no   No notable events documented.  Last Vitals:  Vitals:   06/27/21 1345 06/27/21 1400  BP: 124/78 135/86  Pulse: 85 88  Resp: 15 16  Temp:    SpO2: 92% 92%    Last Pain:  Vitals:   06/27/21 1105  TempSrc:   PainSc: 0-No pain                 Caren Macadam

## 2021-06-27 NOTE — Anesthesia Procedure Notes (Addendum)
  Anesthesia Regional Block: Adductor canal block   Pre-Anesthetic Checklist: , timeout performed,  Correct Patient, Correct Site, Correct Laterality,  Correct Procedure, Correct Position, site marked,  Risks and benefits discussed,  Surgical consent,  Pre-op evaluation,  At surgeon's request and post-op pain management  Laterality: Lower and Right  Prep: chloraprep       Needles:  Injection technique: Single-shot  Needle Type: Echogenic Stimulator Needle     Needle Length: 9cm  Needle Gauge: 20   Needle insertion depth: 2 cm   Additional Needles:   Procedures:,,,, ultrasound used (permanent image in chart),,    Narrative:  Start time: 06/27/2021 10:55 AM End time: 06/27/2021 11:04 AM Injection made incrementally with aspirations every 5 mL.  Performed by: Personally  Anesthesiologist: Leilani Able, MD

## 2021-06-27 NOTE — Evaluation (Signed)
Physical Therapy Evaluation Patient Details Name: Sherri Hart MRN: [id number] DOB: 1946/03/27 Today's Date: 06/27/2021  History of Present Illness  Patient is 75 y.o. female s/p Rt TKR on 06/27/21 with PMH significant for OA, anxiety, A-fib, COPD, CHF, back surgery.  Clinical Impression  Sherri Hart is a 75 y.o. female POD 0 s/p Rt TKR. Patient reports independence with mobility at baseline. Patient is now limited by functional impairments (see PT problem list below) and requires mod assist for bed mobility and transfers with RW. Patient was limited by Rt quad weakness and knee pain and unable to advance past sit<>stand transfer this session. Patient instructed in exercise to facilitate circulation to manage edema and reduce risk of DVT. Patient will benefit from continued skilled PT interventions to address impairments and progress towards PLOF. Acute PT will follow to progress mobility and stair training in preparation for safe discharge home.        Recommendations for follow up therapy are one component of a multi-disciplinary discharge planning process, led by the attending physician.  Recommendations may be updated based on patient status, additional functional criteria and insurance authorization.  Follow Up Recommendations Follow surgeon's recommendation for DC plan and follow-up therapies    Equipment Recommendations  Rolling walker with 5" wheels    Recommendations for Other Services       Precautions / Restrictions Precautions Precautions: Fall Restrictions Weight Bearing Restrictions: No Other Position/Activity Restrictions: WBAT      Mobility  Bed Mobility Overal bed mobility: Needs Assistance Bed Mobility: Supine to Sit;Sit to Supine     Supine to sit: Min assist;HOB elevated Sit to supine: Mod assist   General bed mobility comments: Cues to use bed rail and sequence walking Bil LE's off EOB. Assist to bring Rt LE off EOB. Mod assist to return to  supine for bil LE's.    Transfers Overall transfer level: Needs assistance Equipment used: Rolling walker (2 wheeled) Transfers: Sit to/from Stand Sit to Stand: Mod assist;From elevated surface         General transfer comment: cues for hand placement and assist for power up. pt limited by Rt knee pain and quad weakness and mod assist required to maintain balance and prevent knee buckling. Returned to sit EOB.  Ambulation/Gait                Stairs            Wheelchair Mobility    Modified Rankin (Stroke Patients Only)       Balance Overall balance assessment: Needs assistance Sitting-balance support: Feet supported Sitting balance-Leahy Scale: Fair     Standing balance support: During functional activity;Bilateral upper extremity supported Standing balance-Leahy Scale: Poor Standing balance comment: heavy reliance on therapist and RW                             Pertinent Vitals/Pain Pain Assessment: Faces Faces Pain Scale: Hurts whole lot Pain Location: Rt knee Pain Descriptors / Indicators: Aching;Discomfort;Grimacing Pain Intervention(s): Limited activity within patient's tolerance;Monitored during session;Repositioned    Home Living Family/patient expects to be discharged to:: Private residence Living Arrangements: Other (Comment) (granddaughter and son) Available Help at Discharge: Family;Available 24 hours/day Type of Home: Apartment Home Access: Stairs to enter Entrance Stairs-Rails: Left Entrance Stairs-Number of Steps: 4+6 Home Layout: One level Home Equipment: Cane - single point;Walker - 4 wheels;Shower seat;Grab bars - tub/shower;Grab bars - toilet Additional Comments: pt lives with  her grandaughter and her great grandson    Prior Function Level of Independence: Independent with assistive device(s)         Comments: walks with cane and rollator on occasion. Lives with granddaughter and great grandchildren (4, 6yo).      Hand Dominance   Dominant Hand: Right    Extremity/Trunk Assessment   Upper Extremity Assessment Upper Extremity Assessment: Generalized weakness    Lower Extremity Assessment Lower Extremity Assessment: RLE deficits/detail;Generalized weakness RLE Deficits / Details: limited due to pain, poor quad activation RLE: Unable to fully assess due to pain RLE Sensation: WNL RLE Coordination: WNL    Cervical / Trunk Assessment Cervical / Trunk Assessment: Other exceptions Cervical / Trunk Exceptions: habitus  Communication   Communication: No difficulties  Cognition Arousal/Alertness: Awake/alert Behavior During Therapy: WFL for tasks assessed/performed Overall Cognitive Status: Within Functional Limits for tasks assessed                                        General Comments      Exercises Total Joint Exercises Ankle Circles/Pumps: AROM;Both;20 reps;Supine   Assessment/Plan    PT Assessment Patient needs continued PT services  PT Problem List Decreased strength;Decreased range of motion;Decreased activity tolerance;Decreased balance;Decreased mobility;Decreased knowledge of use of DME;Decreased knowledge of precautions;Pain;Obesity       PT Treatment Interventions DME instruction;Gait training;Stair training;Functional mobility training;Therapeutic activities;Therapeutic exercise;Balance training;Patient/family education    PT Goals (Current goals can be found in the Care Plan section)  Acute Rehab PT Goals Patient Stated Goal: be able to walk more without pain PT Goal Formulation: With patient Time For Goal Achievement: 07/04/21 Potential to Achieve Goals: Good    Frequency 7X/week   Barriers to discharge Inaccessible home environment pt has 10 steps to enter home    Co-evaluation               AM-PAC PT "6 Clicks" Mobility  Outcome Measure Help needed turning from your back to your side while in a flat bed without using bedrails?:  A Little Help needed moving from lying on your back to sitting on the side of a flat bed without using bedrails?: A Little Help needed moving to and from a bed to a chair (including a wheelchair)?: A Little Help needed standing up from a chair using your arms (e.g., wheelchair or bedside chair)?: A Little Help needed to walk in hospital room?: A Little Help needed climbing 3-5 steps with a railing? : A Lot 6 Click Score: 17    End of Session Equipment Utilized During Treatment: Gait belt Activity Tolerance: Patient tolerated treatment well Patient left: in bed;with call bell/phone within reach;with family/visitor present Nurse Communication: Mobility status PT Visit Diagnosis: Other abnormalities of gait and mobility (R26.89);Muscle weakness (generalized) (M62.81);Difficulty in walking, not elsewhere classified (R26.2);Pain Pain - Right/Left: Right Pain - part of body: Knee    Time: 7893-8101 PT Time Calculation (min) (ACUTE ONLY): 24 min   Charges:   PT Evaluation $PT Eval Low Complexity: 1 Low PT Treatments $Therapeutic Activity: 8-22 mins        Wynn Maudlin, DPT Acute Rehabilitation Services Office [phone] Pager [phone]   Anitra Lauth 06/27/2021, 7:34 PM

## 2021-06-27 NOTE — Progress Notes (Signed)
Orthopedic Tech Progress Note Patient Details:  Weda Baumgarner Block 1945/10/21 [id number]  Patient ID: Livingston Diones, female   DOB: [birth year], 75 y.o.   MRN: [id number]  Kizzie Fantasia 06/27/2021, 2:17 PM Cpm placed on right leg in pacu

## 2021-06-27 NOTE — Brief Op Note (Signed)
06/27/2021  1:13 PM  PATIENT:  Sherri Hart  75 y.o. female  PRE-OPERATIVE DIAGNOSIS:  Aseptic loosening right total knee arthroplasty  POST-OPERATIVE DIAGNOSIS:  Aseptic loosening right total knee arthroplasty  PROCEDURE:  Procedure(s): TOTAL KNEE REVISION (Right)  SURGEON:  Surgeon(s) and Role:    Ollen Gross, MD - Primary  PHYSICIAN ASSISTANT:   ASSISTANTS: Nelia Shi, PA-C   ANESTHESIA:   spinal and adductor canal Hart  EBL:  50 mL   BLOOD ADMINISTERED:none  DRAINS: none   LOCAL MEDICATIONS USED:  OTHER Exparel  COUNTS:  YES  TOURNIQUET:   Total Tourniquet Time Documented: Thigh (Left) - 40 minutes Thigh (Left) - 26 minutes Total: Thigh (Left) - 66 minutes   DICTATION: .Other Dictation: Dictation Number [id number]  PLAN OF CARE: Admit to inpatient   PATIENT DISPOSITION:  PACU - hemodynamically stable.

## 2021-06-27 NOTE — Interval H&P Note (Signed)
History and Physical Interval Note:  06/27/2021 11:11 AM  Sherri Hart  has presented today for surgery, with the diagnosis of Aseptic loosening right total knee arthroplasty.  The various methods of treatment have been discussed with the patient and family. After consideration of risks, benefits and other options for treatment, the patient has consented to  Procedure(s): TOTAL KNEE REVISION (Right) as a surgical intervention.  The patient's history has been reviewed, patient examined, no change in status, stable for surgery.  I have reviewed the patient's chart and labs.  Questions were answered to the patient's satisfaction.     Homero Fellers [name]

## 2021-06-27 NOTE — Progress Notes (Signed)
Orthopedic Tech Progress Note Patient Details:  Sherri Hart Nov 13, 1945 [id number]  Patient ID: Sherri Hart, female   DOB: 13-Aug-1946, 75 y.o.   MRN: [id number]  Sherri Hart 06/27/2021, 4:43 PM Patient removed from cpm for PT

## 2021-06-27 NOTE — Anesthesia Preprocedure Evaluation (Addendum)
Anesthesia Evaluation  Patient identified by MRN, date of birth, ID band Patient awake    Reviewed: Allergy & Precautions, NPO status , Patient's Chart, lab work & pertinent test results  Airway Mallampati: II       Dental  (+) Poor Dentition, Missing,    Pulmonary Current Smoker,    Pulmonary exam normal + rhonchi        Cardiovascular  Rhythm:Irregular Rate:Normal     Neuro/Psych    GI/Hepatic negative GI ROS, Neg liver ROS,   Endo/Other  negative endocrine ROS  Renal/GU negative Renal ROS     Musculoskeletal   Abdominal (+) + obese,   Peds  Hematology negative hematology ROS (+)   Anesthesia Other Findings ventricular ejection fraction, by estimation, is 55 to 60%. The left ventricle has normal function. The left ventricle has no regional wall motion abnormalities. There is mild concentric left ventricular hypertrophy. Left ventricular diastolic function could not be evaluated. 2. Right ventricular systolic function is mildly reduced. The right ventricular size is mildly enlarged. There is mildly elevated pulmonary artery systolic pressure. The estimated right ventricular systolic pressure is 37.2 mmHg. 3. Left atrial size was severely dilated. 4. Right atrial size was severely dilated. 5. A small pericardial effusion is present. 6. The mitral valve is normal in structure. Trivial mitral valve regurgitation. No evidence of mitral stenosis. 7. The aortic valve is tricuspid. There is mild calcification of the aortic valve. Aortic valve regurgitation is not visualized. Mild aortic valve sclerosis is present, with no evidence of aortic valve stenosis. 8. The inferior vena cava is dilated in size with >50% respiratory variability, suggesting right atrial pressure of 8 mmHg. Compa  Reproductive/Obstetrics                             Anesthesia Physical Anesthesia Plan  ASA:  3  Anesthesia Plan: Spinal   Post-op Pain Management:  Regional for Post-op pain   Induction:   PONV Risk Score and Plan: 1 and Ondansetron, Midazolam and Dexamethasone  Airway Management Planned: Natural Airway and Simple Face Mask  Additional Equipment: None  Intra-op Plan:   Post-operative Plan:   Informed Consent: I have reviewed the patients History and Physical, chart, labs and discussed the procedure including the risks, benefits and alternatives for the proposed anesthesia with the patient or authorized representative who has indicated his/her understanding and acceptance.       Plan Discussed with: CRNA  Anesthesia Plan Comments:         Anesthesia Quick Evaluation

## 2021-06-27 NOTE — Progress Notes (Signed)
Assisted Dr. Hatchett with right, ultrasound guided, adductor canal block. Side rails up, monitors on throughout procedure. See vital signs in flow sheet. Tolerated Procedure well.  

## 2021-06-27 NOTE — Anesthesia Procedure Notes (Signed)
Procedure Name: MAC Date/Time: 06/27/2021 11:17 AM Performed by: Deliah Boston, CRNA Pre-anesthesia Checklist: Patient identified, Emergency Drugs available, Suction available and Patient being monitored Patient Re-evaluated:Patient Re-evaluated prior to induction Oxygen Delivery Method: Simple face mask Placement Confirmation: positive ETCO2

## 2021-06-27 NOTE — Transfer of Care (Signed)
Immediate Anesthesia Transfer of Care Note  Patient: Sherri Hart  Procedure(s) Performed: Procedure(s): TOTAL KNEE REVISION (Right)  Patient Location: PACU  Anesthesia Type:MAC, Regional and Spinal  Level of Consciousness: Patient easily awoken, sedated, comfortable, cooperative, following commands, responds to stimulation.   Airway & Oxygen Therapy: Patient spontaneously breathing, ventilating well, oxygen via simple oxygen mask.  Post-op Assessment: Report given to PACU RN, vital signs reviewed and stable.   Post vital signs: Reviewed and stable.  Complications: No apparent anesthesia complications  Last Vitals:  Vitals Value Taken Time  BP 135/80 06/27/21 1336  Temp    Pulse 68 06/27/21 1338  Resp 18 06/27/21 1338  SpO2 96 % 06/27/21 1338  Vitals shown include unvalidated device data.  Last Pain:  Vitals:   06/27/21 1105  TempSrc:   PainSc: 0-No pain      Patients Stated Pain Goal: 4 (47/09/29 5747)  Complications: No notable events documented.

## 2021-06-28 LAB — CBC
HCT: 42.3 % (ref 36.0–46.0)
Hemoglobin: 13.4 g/dL (ref 12.0–15.0)
MCH: 32.7 pg (ref 26.0–34.0)
MCHC: 31.7 g/dL (ref 30.0–36.0)
MCV: 103.2 fL — ABNORMAL HIGH (ref 80.0–100.0)
Platelets: 186 10*3/uL (ref 150–400)
RBC: 4.1 MIL/uL (ref 3.87–5.11)
RDW: 13.4 % (ref 11.5–15.5)
WBC: 14 10*3/uL — ABNORMAL HIGH (ref 4.0–10.5)
nRBC: 0 % (ref 0.0–0.2)

## 2021-06-28 LAB — BASIC METABOLIC PANEL
Anion gap: 9 (ref 5–15)
BUN: 18 mg/dL (ref 8–23)
CO2: 25 mmol/L (ref 22–32)
Calcium: 8.7 mg/dL — ABNORMAL LOW (ref 8.9–10.3)
Chloride: 102 mmol/L (ref 98–111)
Creatinine, Ser: 1.03 mg/dL — ABNORMAL HIGH (ref 0.44–1.00)
GFR, Estimated: 57 mL/min — ABNORMAL LOW (ref >60)
Glucose, Bld: 181 mg/dL — ABNORMAL HIGH (ref 70–99)
Potassium: 4.5 mmol/L (ref 3.5–5.1)
Sodium: 136 mmol/L (ref 135–145)

## 2021-06-28 MED ORDER — OXYCODONE HCL 5 MG PO TABS: 5.0000 mg | ORAL_TABLET | ORAL | 0 refills | Status: DC | PRN

## 2021-06-28 MED ORDER — ALBUTEROL SULFATE (2.5 MG/3ML) 0.083% IN NEBU
2.5000 mg | INHALATION_SOLUTION | Freq: Three times a day (TID) | RESPIRATORY_TRACT | Status: DC
  Administered 2021-06-29 – 2021-07-01 (×7): 2.5 mg via RESPIRATORY_TRACT
  Filled 2021-06-28 (×7): qty 3

## 2021-06-28 MED ORDER — METHOCARBAMOL 500 MG PO TABS: 500.0000 mg | ORAL_TABLET | Freq: Four times a day (QID) | ORAL | 0 refills | Status: DC | PRN

## 2021-06-28 MED ORDER — SALINE SPRAY 0.65 % NA SOLN
1.0000 | NASAL | Status: DC | PRN
  Administered 2021-06-29 – 2021-07-01 (×2): 1 via NASAL
  Filled 2021-06-28: qty 44

## 2021-06-28 MED ORDER — TRAMADOL HCL 50 MG PO TABS: 50.0000 mg | ORAL_TABLET | Freq: Four times a day (QID) | ORAL | 0 refills | Status: DC | PRN

## 2021-06-28 MED ORDER — ALBUTEROL SULFATE HFA 108 (90 BASE) MCG/ACT IN AERS
2.0000 | INHALATION_SPRAY | RESPIRATORY_TRACT | Status: DC | PRN
  Administered 2021-06-29 – 2021-07-01 (×2): 2 via RESPIRATORY_TRACT
  Filled 2021-06-28 (×2): qty 6.7

## 2021-06-28 NOTE — Progress Notes (Signed)
Patient remains on 4 liters nasal cannula this morning with O2 sats in the low 90's. Patient's sats on room air are high 70's to mid 80's. Patient is compliant with incentive spirometer use.

## 2021-06-28 NOTE — Op Note (Signed)
NAME: Sherri Hart, Sherri Hart MEDICAL RECORD NO: [id number] ACCOUNT NO: [id number] DATE OF BIRTH: 09-21-1945 FACILITY: [facility] LOCATION: [id number] PHYSICIAN: Gus Rankin. Javaughn Opdahl, MD  Operative Report   DATE OF PROCEDURE: 06/27/2021  PREOPERATIVE DIAGNOSIS:  Aseptic loosening, right total knee arthroplasty.  POSTOPERATIVE DIAGNOSIS:  Aseptic loosening, right total knee arthroplasty.  PROCEDURE:  Right total knee arthroplasty revision.  SURGEON:  Gus Rankin. Reade Trefz, MD  ASSISTANT:  Nelia Shi, PA-C  ANESTHESIA:  Adductor canal block and spinal.  ESTIMATED BLOOD LOSS:  50 mL.  DRAIN: None.  TOURNIQUET TIME:  Up 38 minutes at 300 mmHg, down 8 minutes, up additional 29 minutes at 300 mmHg.  COMPLICATIONS:  None.  CONDITION:  Stable to recovery.  BRIEF CLINICAL NOTE:  The patient is a 75 year old female who had a right total knee arthroplasty done approximately 20 years ago.  She presented to my office several months ago with pain and instability in the knee.  It was noted that she had probable  loosening in her knee, had developed a valgus alignment.  We obtained a bone scan, which did show loosening of the tibial component.  She presents now for right total knee arthroplasty revision.  PROCEDURE IN DETAIL:  After successful administration of adductor canal block and spinal anesthetic, a tourniquet was placed high on the right thigh.  Right lower extremity prepped and draped in the usual sterile fashion.  Extremity was wrapped an  Esmarch, knee flexed, tourniquet inflated to 300 mmHg.  Midline incision was made with a 10 blade through subcutaneous tissue to the level of the extensor mechanism.  A fresh blade was used to make a medial parapatellar arthrotomy.  We did not encounter  any fluid in the joint, though she did have a fair amount of polyethylene wear debris in the joint.  Soft tissue at the proximal medial tibia was subperiosteally elevated to the joint line with a knife and into the  semimembranosus bursa with a Cobb  elevator.  Soft tissue laterally was elevated with attention being paid to avoid the patellar tendon on the tibial tubercle.  The patella was everted and knee flexed 90 degrees.  I removed the tibial polyethylene from the tibial tray.  There was  significant wear in the polyethylene.  Tibia was then subluxed forward and circumferential retraction was placed.  Oscillating saw was used to disrupt the interface between the tibial component and bone and the tibial component was easily removed with no  bone loss.  It was consistent with loosening.  I then removed the cement plug from the tibial canal and we thoroughly irrigated the canal and reamed up to 14 mm for 14 mm cemented stem.  The extramedullary tibial alignment guide was placed, referencing  proximally at the medial aspect of the tibial tubercle and distally along the second metatarsal axis and tibial crest.  The block was pinned to remove 2 mm from the previous cut bone surface.  Resection was made with an oscillating saw.  Size 6 for the  Attune revision system was placed and then the proximal tibia was drilled with a modular drill and a modular drill plus stem extension.  We then prepared for a 29 mm sleeve with the broach.  Tibial preparation was completed.  I trialled for a cement  restrictor, size 5 was most appropriate and a size 5 cement restrictor was placed at the appropriate depth in a tibial canal.  We then disrupted the interface between the femoral component and bone with osteotomes and  removed the femoral component with essentially no bone loss.  We accessed the femoral canal and reamed up to a 24 mm, which got a great press-fit.  This was reamed  for a 24 x 60 stem extension.  The reamer was left to serve as intramedullary alignment guide and then the distal femoral cutting block was placed to remove approximately 2 mm off the distal femur.  Size 6 was also most appropriate femoral component.   The  cutting block for the size 6 attached to a 60 x 24 stem extension is placed and the rotation is marked off the epicondylar axis and confirmed by creating a rectangular flexion gap at 90 degrees with spacer blocks.  Anterior and posterior cuts were  made.  No bone was resected.  The intercondylar block was placed.  The neck cut was subsequently made.  The trial was then placed on the tibial side.  It was a size 6 Attune revision tray with a 14 x 30 stem extension and 5 mm augments medial and lateral  and a 29 sleeve.  On the femoral side, a size 6 Attune constrained femur with 4 mm augments distally, medial and lateral and a 24 x 60 stem extension and 5 degrees of valgus.  This was placed and we opted 18 mm insert and with 18 full extension was  achieved with excellent varus, valgus, and anterior, posterior balance throughout full range of motion.  Patelloplasty was performed to expose the patellar component, which did not show any wear.  It tracked normally, so the patellar component was left  intact.  We then released the tourniquet after initial tourniquet time of 38 minutes and tourniquet was let down for 8 minutes while the components were assembled on the back table.  After 8 minutes, the legs were wrapped in Esmarch and tourniquet  reinflated to 300 mmHg.  The trials were removed and the cut bone surfaces were prepared with pulsatile lavage.  The implants were assembled on the back table and then three batches of gentamicin impregnated cement is mixed.  We then injected into the  tibial canal and cemented the canal as well as the sleeve and cemented proximally.  The Attune revision tibia was placed, it was a size 6 with the 14 x 30 stem extension and the 29 cemented sleeve with 5 mm augments medial and lateral, it was impacted  and all extruded cement removed.  On the femoral side, we cemented distally, had a press-fit stem.  The size of the femur was 6 posterior stabilized constrained femur with a 4 mm  augments distally, both medial and lateral and a 24 x 60 press-fit stem.   This was impacted and all extruded cement removed.  18 mm insert was placed.  Knee held in full extension with excellent stability throughout full range of motion.  When the cement was fully hardened, then a permanent 18 mm posterior stabilized rotating  platform insert was placed into the tibial tray.  The knee was reduced with outstanding stability throughout full range of motion.  The patella tracked normally.  A 20 mL of Exparel mixed with 60 mL of saline was injected into the extensor mechanism,  periosteum of the femur and subcu.  The wound was again copiously irrigated with saline solution.  Arthrotomy was closed with running 0 Stratafix suture.  Flexion against gravity was 130 degrees.  Patella tracked normally.  Tourniquet was released for  second tourniquet time 29 minutes.  The subcutaneous was then  closed with interrupted 2-0 Vicryl and subcuticular running 4-0 Monocryl.  Incision was cleaned and dried and Steri-Strips and sterile dressing applied.  She was placed into a knee  immobilizer, awakened, and transported to recovery in stable condition.  Note that a surgical assistant was of medical necessity for this procedure to do it in a safe and expeditious manner.  Surgical assistant was necessary for proper positioning of the limb, for safe removal of the old implants and for safe and accurate  placement of the new implants.   NIK D: 06/27/2021 1:22:31 pm T: 06/28/2021 1:21:00 am  JOB: [id number]/ [id number]

## 2021-06-28 NOTE — Progress Notes (Signed)
Physical Therapy Treatment Patient Details Name: Sherri Hart MRN: [id number] DOB: 1946/02/05 Today's Date: 06/28/2021   History of Present Illness Patient is 75 y.o. female s/p Rt TKR on 06/27/21 with PMH significant for OA, anxiety, A-fib, COPD, CHF, back surgery.    PT Comments    Pt very cooperative but limited by fatigue/SOB/pain/premorbid deconditioning and requiring increased time and significant assist for performance of minimal basic mobility tasks.  This am, pt performed limited therex program and up out of bed to [facility] and then stand pvt with RW to recliner.  RN alerted and pt requesting use of inhalers 2* SOB on 4L O2.  Recommendations for follow up therapy are one component of a multi-disciplinary discharge planning process, led by the attending physician.  Recommendations may be updated based on patient status, additional functional criteria and insurance authorization.  Follow Up Recommendations  Follow surgeon's recommendation for DC plan and follow-up therapies     Equipment Recommendations  Rolling walker with 5" wheels    Recommendations for Other Services       Precautions / Restrictions Precautions Precautions: Fall;Knee Required Braces or Orthoses: Knee Immobilizer - Right Knee Immobilizer - Right: Discontinue once straight leg raise with < 10 degree lag Restrictions Weight Bearing Restrictions: No Other Position/Activity Restrictions: WBAT     Mobility  Bed Mobility Overal bed mobility: Needs Assistance Bed Mobility: Supine to Sit     Supine to sit: Min assist;HOB elevated     General bed mobility comments: Cues to use bed rail and sequence walking Bil LE's off EOB. Assist to bring R LE over EOB    Transfers Overall transfer level: Needs assistance Equipment used: Rolling walker (2 wheeled) Transfers: Sit to/from [employer] Sit to Stand: Mod assist;From elevated surface;+2 safety/equipment Stand pivot transfers: Mod  assist;+2 safety/equipment       General transfer comment: Cues for LE management and use of UEs to self assist.  Physical assist to bring wt up and fwd and to balance in standing.  Step by step cues to perform stand pvt safely with RW.  Ambulation/Gait Ambulation/Gait assistance: Min assist;Mod assist;+2 physical assistance;+2 safety/equipment Gait Distance (Feet): 2 Feet Assistive device: Rolling walker (2 wheeled) Gait Pattern/deviations: Step-to pattern;Decreased step length - right;Decreased step length - left;Shuffle;Antalgic;Decreased stance time - right;Trunk flexed Gait velocity: decr   General Gait Details: cues for posture, sequence, position from RW and safety.  Pt stepped forward to allow placement of BSC 2* urinary incontinence.   Stairs             Wheelchair Mobility    Modified Rankin (Stroke Patients Only)       Balance Overall balance assessment: Needs assistance Sitting-balance support: Feet supported Sitting balance-Leahy Scale: Good     Standing balance support: During functional activity;Bilateral upper extremity supported Standing balance-Leahy Scale: Poor Standing balance comment: heavy reliance on therapist and RW                            Cognition Arousal/Alertness: Awake/alert Behavior During Therapy: WFL for tasks assessed/performed Overall Cognitive Status: Within Functional Limits for tasks assessed                                        Exercises Total Joint Exercises Ankle Circles/Pumps: AROM;Both;20 reps;Supine Quad Sets: AROM;Both;10 reps;Supine Heel Slides: AAROM;Right;10 reps;Supine Straight Leg Raises:  AAROM;Right;10 reps;Supine    General Comments        Pertinent Vitals/Pain Pain Assessment: Faces Faces Pain Scale: Hurts even more Pain Location: Rt knee Pain Descriptors / Indicators: Aching;Discomfort;Grimacing Pain Intervention(s): Limited activity within patient's  tolerance;Monitored during session;Premedicated before session;Ice applied    Home Living                      Prior Function            PT Goals (current goals can now be found in the care plan section) Acute Rehab PT Goals Patient Stated Goal: be able to walk more without pain PT Goal Formulation: With patient Time For Goal Achievement: 07/04/21 Potential to Achieve Goals: Good Progress towards PT goals: Progressing toward goals    Frequency    7X/week      PT Plan Current plan remains appropriate    Co-evaluation              AM-PAC PT "6 Clicks" Mobility   Outcome Measure  Help needed turning from your back to your side while in a flat bed without using bedrails?: A Little Help needed moving from lying on your back to sitting on the side of a flat bed without using bedrails?: A Little Help needed moving to and from a bed to a chair (including a wheelchair)?: A Lot Help needed standing up from a chair using your arms (e.g., wheelchair or bedside chair)?: A Lot Help needed to walk in hospital room?: A Lot Help needed climbing 3-5 steps with a railing? : Total 6 Click Score: 13    End of Session Equipment Utilized During Treatment: Gait belt;Right knee immobilizer Activity Tolerance: Patient limited by fatigue Patient left: in chair;with call bell/phone within reach;with chair alarm set;with nursing/sitter in room Nurse Communication: Mobility status PT Visit Diagnosis: Other abnormalities of gait and mobility (R26.89);Muscle weakness (generalized) (M62.81);Difficulty in walking, not elsewhere classified (R26.2);Pain Pain - Right/Left: Right Pain - part of body: Knee     Time: 0810-0852 PT Time Calculation (min) (ACUTE ONLY): 42 min  Charges:  $Gait Training: 8-22 mins $Therapeutic Exercise: 8-22 mins $Therapeutic Activity: 8-22 mins                     Mauro Kaufmann PT Acute Rehabilitation Services Pager [phone] Office  [phone]    Sherri Hart 06/28/2021, 9:00 AM

## 2021-06-28 NOTE — Plan of Care (Signed)
  Problem: Activity: Goal: Risk for activity intolerance will decrease Outcome: Progressing   Problem: Elimination: Goal: Will not experience complications related to urinary retention Outcome: Progressing   Problem: Activity: Goal: Range of joint motion will improve Outcome: Progressing   Problem: Pain Management: Goal: Pain level will decrease with appropriate interventions Outcome: Progressing

## 2021-06-28 NOTE — Progress Notes (Signed)
Physical Therapy Treatment Patient Details Name: Sherri Hart MRN: [id number] DOB: [birth year] Today's Date: 06/28/2021   History of Present Illness Patient is 75 y.o. female s/p Rt TKR on 06/27/21 with PMH significant for OA, anxiety, A-fib, COPD, CHF, back surgery.    PT Comments    Pt very cooperative but progressing slowly with mobility and limited by fatigue/SOB/pain/premorbid deconditioning and requiring increased time and significant assist for performance of basic mobility tasks.    Recommendations for follow up therapy are one component of a multi-disciplinary discharge planning process, led by the attending physician.  Recommendations may be updated based on patient status, additional functional criteria and insurance authorization.  Follow Up Recommendations  Follow surgeon's recommendation for DC plan and follow-up therapies     Equipment Recommendations  Rolling walker with 5" wheels    Recommendations for Other Services       Precautions / Restrictions Precautions Precautions: Fall;Knee Required Braces or Orthoses: Knee Immobilizer - Right Knee Immobilizer - Right: Discontinue once straight leg raise with < 10 degree lag Restrictions Weight Bearing Restrictions: No Other Position/Activity Restrictions: WBAT     Mobility  Bed Mobility Overal bed mobility: Needs Assistance Bed Mobility: Sit to Supine       Sit to supine: Mod assist   General bed mobility comments: INcreased time with cues for sequence and assist to manage bil LEs onto bed    Transfers Overall transfer level: Needs assistance Equipment used: Rolling walker (2 wheeled) Transfers: Sit to/from Stand Sit to Stand: Mod assist;+2 safety/equipment;From elevated surface         General transfer comment: Cues for LE management and use of UEs to self assist.  Physical assist to bring wt up and fwd and to balance in standing.  Ambulation/Gait Ambulation/Gait assistance: Min assist;Mod  assist;+2 safety/equipment Gait Distance (Feet): 14 Feet Assistive device: Rolling walker (2 wheeled) Gait Pattern/deviations: Step-to pattern;Decreased step length - left;Decreased stance time - right;Shuffle;Trunk flexed Gait velocity: decr   General Gait Details: Step-by-step cues for posture, sequence, position from RW and safety with additional cues to focus pt on task and negate unsafe impulsive movements.  [employer] Rankin (Stroke Patients Only)       Balance Overall balance assessment: Needs assistance Sitting-balance support: Feet supported Sitting balance-Leahy Scale: Good     Standing balance support: During functional activity;Bilateral upper extremity supported Standing balance-Leahy Scale: Poor Standing balance comment: heavy reliance on therapist and RW                            Cognition Arousal/Alertness: Awake/alert Behavior During Therapy: WFL for tasks assessed/performed;Impulsive Overall Cognitive Status: Within Functional Limits for tasks assessed                                        Exercises      General Comments        Pertinent Vitals/Pain Pain Assessment: 0-10 Pain Score: 6  Pain Location: Rt knee Pain Descriptors / Indicators: Aching;Discomfort;Grimacing Pain Intervention(s): Limited activity within patient's tolerance;Monitored during session;Premedicated before session;Ice applied    Home Living                      Prior Function  PT Goals (current goals can now be found in the care plan section) Acute Rehab PT Goals Patient Stated Goal: be able to walk more without pain PT Goal Formulation: With patient Time For Goal Achievement: 07/04/21 Potential to Achieve Goals: Good Progress towards PT goals: Progressing toward goals    Frequency    7X/week      PT Plan Current plan remains  appropriate    Co-evaluation              AM-PAC PT "6 Clicks" Mobility   Outcome Measure  Help needed turning from your back to your side while in a flat bed without using bedrails?: A Little Help needed moving from lying on your back to sitting on the side of a flat bed without using bedrails?: A Little Help needed moving to and from a bed to a chair (including a wheelchair)?: A Lot Help needed standing up from a chair using your arms (e.g., wheelchair or bedside chair)?: A Lot Help needed to walk in hospital room?: A Lot Help needed climbing 3-5 steps with a railing? : Total 6 Click Score: 13    End of Session Equipment Utilized During Treatment: Gait belt;Right knee immobilizer Activity Tolerance: Patient limited by fatigue Patient left: in bed;with call bell/phone within reach;with bed alarm set;with nursing/sitter in room Nurse Communication: Mobility status PT Visit Diagnosis: Other abnormalities of gait and mobility (R26.89);Muscle weakness (generalized) (M62.81);Difficulty in walking, not elsewhere classified (R26.2);Pain Pain - Right/Left: Right Pain - part of body: Knee     Time: 3295-1884 PT Time Calculation (min) (ACUTE ONLY): 21 min  Charges:  $Gait Training: 8-22 mins                     Mauro Kaufmann PT Acute Rehabilitation Services Pager [phone] Office [phone]    Jojo Pehl 06/28/2021, 3:41 PM

## 2021-06-28 NOTE — Progress Notes (Signed)
   Subjective: 1 Day Post-Op Procedure(s) (LRB): TOTAL KNEE REVISION (Right) Patient reports pain as mild.   Patient seen in rounds by Dr. Lequita Halt. Patient is well, and has had no acute complaints or problems. Denies SOB, chest pain, or calf pain. No acute overnight events. Ambulated limited with therapy yesterday. Will continue therapy today.     Objective: Vital signs in last 24 hours: Temp:  [97.6 F (36.4 C)-98.3 F (36.8 C)] 98 F (36.7 C) (10/20 0544) Pulse Rate:  [61-93] 66 (10/20 0544) Resp:  [13-22] 17 (10/20 0544) BP: (124-185)/(70-117) 159/95 (10/20 0544) SpO2:  [90 %-96 %] 94 % (10/20 0544) Weight:  [108.4 kg] 108.4 kg (10/19 0944)  Intake/Output from previous day:  Intake/Output Summary (Last 24 hours) at 06/28/2021 0751 Last data filed at 06/28/2021 0738 Gross per 24 hour  Intake 2867.08 ml  Output 3100 ml  Net -232.92 ml     Intake/Output this shift: Total I/O In: 148.8 [I.V.:148.8] Out: -   Labs: Recent Labs    06/28/21 0320  HGB 13.4   Recent Labs    06/28/21 0320  WBC 14.0*  RBC 4.10  HCT 42.3  PLT 186   Recent Labs    06/28/21 0320  NA 136  K 4.5  CL 102  CO2 25  BUN 18  CREATININE 1.03*  GLUCOSE 181*  CALCIUM 8.7*   No results for input(s): LABPT, INR in the last 72 hours.  Exam: General - Patient is Alert and Oriented Extremity - Neurologically intact Neurovascular intact Intact pulses distally Dorsiflexion/Plantar flexion intact Dressing - dressing C/D/I Motor Function - intact, moving foot and toes well on exam.   Past Medical History:  Diagnosis Date   Anxiety    Arthritis    Atrial fibrillation (HCC)    Complication of anesthesia    used to get migraine headaches after surgery   Congestive heart failure (HCC)    COPD (chronic obstructive pulmonary disease) (HCC)    Dyspnea    if afib causes a problem   Pneumonia     Assessment/Plan: 1 Day Post-Op Procedure(s) (LRB): TOTAL KNEE REVISION  (Right) Principal Problem:   Failed total knee arthroplasty (HCC) Active Problems:   Status post revision of total knee replacement, right  Estimated body mass index is 39.77 kg/m as calculated from the following:   Height as of this encounter: 5\' 5"  (1.651 m).   Weight as of this encounter: 108.4 kg. Up with therapy    DVT Prophylaxis - Xarelto and TED hose Weight bearing as tolerated. Continue therapy.  Plan is to go Home after hospital stay.  Plan for two sessions with PT today, and if meeting goals, will plan for possible discharge this afternoon.   Patient to follow up in two weeks with Dr. in clinic.   The PDMP database was reviewed today prior to any opioid medications being prescribed to this patient.Lequita Halt, MBA, PA-C Orthopedic Surgery 06/28/2021, 7:51 AM

## 2021-06-28 NOTE — Progress Notes (Signed)
Alphonsa Overall PA called concerning pt overall breathing. She is not overall short of breath though she continues to desat to 86 percent on 6l [place] with talking and exertion. She will recover to 89 percent on 6l with rest. Pt lungs also remain wheezy and tight. Pt also has a non-productive cough as well. Rn also encouraged pt to use her incentive spirometer and use her inhaler properly. Rn educated on pt how to use her inhaler properly. Respiratory therapy has already made needed changes. Provider did not make any changes at this time as the pt is not in acute distress. PT also states that she takes her claritin daily and that she feels like this helps her sinuses that feel congested as well. Rn will discuss these concerns with md on rounds. Rn will continue to monitor.

## 2021-06-29 ENCOUNTER — Encounter (HOSPITAL_COMMUNITY): Payer: Self-pay | Admitting: Orthopedic Surgery

## 2021-06-29 LAB — CBC
HCT: 38.1 % (ref 36.0–46.0)
Hemoglobin: 12.7 g/dL (ref 12.0–15.0)
MCH: 33.6 pg (ref 26.0–34.0)
MCHC: 33.3 g/dL (ref 30.0–36.0)
MCV: 100.8 fL — ABNORMAL HIGH (ref 80.0–100.0)
Platelets: 170 10*3/uL (ref 150–400)
RBC: 3.78 MIL/uL — ABNORMAL LOW (ref 3.87–5.11)
RDW: 13.6 % (ref 11.5–15.5)
WBC: 19.3 10*3/uL — ABNORMAL HIGH (ref 4.0–10.5)
nRBC: 0 % (ref 0.0–0.2)

## 2021-06-29 LAB — BASIC METABOLIC PANEL
Anion gap: 8 (ref 5–15)
BUN: 34 mg/dL — ABNORMAL HIGH (ref 8–23)
CO2: 28 mmol/L (ref 22–32)
Calcium: 9 mg/dL (ref 8.9–10.3)
Chloride: 99 mmol/L (ref 98–111)
Creatinine, Ser: 1.26 mg/dL — ABNORMAL HIGH (ref 0.44–1.00)
GFR, Estimated: 45 mL/min — ABNORMAL LOW (ref >60)
Glucose, Bld: 162 mg/dL — ABNORMAL HIGH (ref 70–99)
Potassium: 4.4 mmol/L (ref 3.5–5.1)
Sodium: 135 mmol/L (ref 135–145)

## 2021-06-29 MED ORDER — LORATADINE 10 MG PO TABS
10.0000 mg | ORAL_TABLET | Freq: Every day | ORAL | Status: DC
  Administered 2021-06-29 – 2021-07-06 (×8): 10 mg via ORAL
  Filled 2021-06-29 (×8): qty 1

## 2021-06-29 NOTE — Progress Notes (Signed)
Physical Therapy Treatment Patient Details Name: Sherri Hart MRN: [id number] DOB: [birth year] Today's Date: 06/29/2021   History of Present Illness Patient is 75 y.o. female s/p Rt TKR on 06/27/21 with PMH significant for OA, anxiety, A-fib, COPD, CHF, back surgery.    PT Comments    Pt continues very cooperative and progressing slowly with mobility but also continues limited by pain and fatigues easily.   Recommendations for follow up therapy are one component of a multi-disciplinary discharge planning process, led by the attending physician.  Recommendations may be updated based on patient status, additional functional criteria and insurance authorization.  Follow Up Recommendations  Follow surgeon's recommendation for DC plan and follow-up therapies     Equipment Recommendations  Rolling walker with 5" wheels    Recommendations for Other Services       Precautions / Restrictions Precautions Precautions: Fall;Knee Required Braces or Orthoses: Knee Immobilizer - Right Knee Immobilizer - Right: Discontinue once straight leg raise with < 10 degree lag Restrictions Weight Bearing Restrictions: No Other Position/Activity Restrictions: WBAT     Mobility  Bed Mobility Overal bed mobility: Needs Assistance Bed Mobility: Supine to Sit     Supine to sit: Min assist;HOB elevated     General bed mobility comments: INcreased time with cues for sequence and assist to manage R LE    Transfers Overall transfer level: Needs assistance Equipment used: Rolling walker (2 wheeled) Transfers: Sit to/from Stand Sit to Stand: +2 safety/equipment;Min assist;Mod assist         General transfer comment: Cues for LE management and use of UEs to self assist.  Physical assist to bring wt up and fwd and to balance in standing.  Ambulation/Gait Ambulation/Gait assistance: Min assist;Mod assist;+2 safety/equipment Gait Distance (Feet): 34 Feet Assistive device: Rolling walker (2  wheeled) Gait Pattern/deviations: Step-to pattern;Decreased step length - left;Decreased stance time - right;Shuffle;Trunk flexed Gait velocity: decr   General Gait Details: Step-by-step cues for posture, sequence, position from RW and safety with additional cues to focus pt on task and negate unsafe impulsive movements.  Distance ltd by [employer] (Stroke Patients Only)       Balance Overall balance assessment: Needs assistance Sitting-balance support: Feet supported Sitting balance-Leahy Scale: Good     Standing balance support: During functional activity;Bilateral upper extremity supported Standing balance-Leahy Scale: Poor                              Cognition Arousal/Alertness: Awake/alert Behavior During Therapy: WFL for tasks assessed/performed;Impulsive Overall Cognitive Status: Within Functional Limits for tasks assessed                                        Exercises Total Joint Exercises Ankle Circles/Pumps: AROM;Both;20 reps;Supine Quad Sets: AROM;Both;10 reps;Supine Heel Slides: AAROM;Right;Supine;15 reps Straight Leg Raises: AAROM;Right;10 reps;Supine    General Comments        Pertinent Vitals/Pain Pain Assessment: 0-10 Pain Score: 6  Pain Location: Rt knee Pain Descriptors / Indicators: Aching;Discomfort;Grimacing Pain Intervention(s): Limited activity within patient's tolerance;Monitored during session;Premedicated before session;Ice applied    Home Living                      Prior Function  PT Goals (current goals can now be found in the care plan section) Acute Rehab PT Goals Patient Stated Goal: be able to walk more without pain PT Goal Formulation: With patient Time For Goal Achievement: 07/04/21 Potential to Achieve Goals: Good Progress towards PT goals: Progressing toward goals    Frequency     7X/week      PT Plan Current plan remains appropriate    Co-evaluation              AM-PAC PT "6 Clicks" Mobility   Outcome Measure  Help needed turning from your back to your side while in a flat bed without using bedrails?: A Little Help needed moving from lying on your back to sitting on the side of a flat bed without using bedrails?: A Little Help needed moving to and from a bed to a chair (including a wheelchair)?: A Lot Help needed standing up from a chair using your arms (e.g., wheelchair or bedside chair)?: A Lot Help needed to walk in hospital room?: A Lot Help needed climbing 3-5 steps with a railing? : Total 6 Click Score: 13    End of Session Equipment Utilized During Treatment: Gait belt;Right knee immobilizer Activity Tolerance: Patient limited by fatigue;Patient limited by pain Patient left: in chair;with call bell/phone within reach;with chair alarm set;with family/visitor present Nurse Communication: Mobility status PT Visit Diagnosis: Other abnormalities of gait and mobility (R26.89);Muscle weakness (generalized) (M62.81);Difficulty in walking, not elsewhere classified (R26.2);Pain Pain - Right/Left: Right Pain - part of body: Knee     Time: 1111-1150 PT Time Calculation (min) (ACUTE ONLY): 39 min  Charges:  $Gait Training: 8-22 mins $Therapeutic Exercise: 8-22 mins                     Mauro Kaufmann PT Acute Rehabilitation Services Pager [phone] Office [phone]    Sherri Hart 06/29/2021, 12:22 PM

## 2021-06-29 NOTE — Care Management Important Message (Signed)
Important Message  Patient Details IM Letter given to the Patient. Name: Nieshia Larmon MRN: [id number] Date of Birth: 03-11-1946   Medicare Important Message Given:  Yes     Caren Macadam 06/29/2021, 12:49 PM

## 2021-06-29 NOTE — Progress Notes (Signed)
   Subjective: 2 Days Post-Op Procedure(s) (LRB): TOTAL KNEE REVISION (Right) Patient reports pain as mild.   Patient seen in rounds by Dr. Lequita Halt. Patient is had some issues with oxygen saturation dropping last night. Patient with known chronic COPD that requires supplemental O2 at baseline. Discussed with nurse this AM that increasing O2 intake will lead to CO2 retention. Will not make any changes at this time. Home allergy medication continued. Denies chest pain and patient with no shortness of breath this AM. Plan is to go Home after hospital stay.  Objective: Vital signs in last 24 hours: Temp:  [97.8 F (36.6 C)-98.2 F (36.8 C)] 97.8 F (36.6 C) (10/21 0531) Pulse Rate:  [78-96] 81 (10/21 0531) Resp:  [17-20] 17 (10/21 0531) BP: (127-144)/(74-90) 144/90 (10/21 0531) SpO2:  [89 %-95 %] 93 % (10/21 0531)  Intake/Output from previous day:  Intake/Output Summary (Last 24 hours) at 06/29/2021 0712 Last data filed at 06/29/2021 0530 Gross per 24 hour  Intake 2697.71 ml  Output 1700 ml  Net 997.71 ml    Intake/Output this shift: No intake/output data recorded.  Labs: Recent Labs    06/28/21 0320 06/29/21 0321  HGB 13.4 12.7   Recent Labs    06/28/21 0320 06/29/21 0321  WBC 14.0* 19.3*  RBC 4.10 3.78*  HCT 42.3 38.1  PLT 186 170   Recent Labs    06/28/21 0320 06/29/21 0359  NA 136 135  K 4.5 4.4  CL 102 99  CO2 25 28  BUN 18 34*  CREATININE 1.03* 1.26*  GLUCOSE 181* 162*  CALCIUM 8.7* 9.0   No results for input(s): LABPT, INR in the last 72 hours.  Exam: General - Patient is Alert and Oriented Extremity - Neurologically intact Neurovascular intact Sensation intact distally Dorsiflexion/Plantar flexion intact Dressing/Incision - clean, dry, no drainage Motor Function - intact, moving foot and toes well on exam.   Past Medical History:  Diagnosis Date   Anxiety    Arthritis    Atrial fibrillation (HCC)    Complication of anesthesia    used  to get migraine headaches after surgery   Congestive heart failure (HCC)    COPD (chronic obstructive pulmonary disease) (HCC)    Dyspnea    if afib causes a problem   Pneumonia     Assessment/Plan: 2 Days Post-Op Procedure(s) (LRB): TOTAL KNEE REVISION (Right) Principal Problem:   Failed total knee arthroplasty (HCC) Active Problems:   Status post revision of total knee replacement, right  Estimated body mass index is 39.77 kg/m as calculated from the following:   Height as of this encounter: 5\' 5"  (1.651 m).   Weight as of this encounter: 108.4 kg. Up with therapy  DVT Prophylaxis - Xarelto Weight-bearing as tolerated  As described in physical therapy notes from yesterday, patient is deconditioned and will be slow to progress with mobility. She is ready to discharge to home once she is meeting her goals with therapy. This may take into the weekend to accomplish however.   Possible discharge today, but will most likely be Saturday or Sunday. Scheduled for OPPT with [facility]. Patient will follow-up in the office in 2 weeks.  The PDMP database was reviewed today prior to any opioid medications being prescribed to this patient.  Tuesday, PA-C Orthopedic Surgery [phone] 06/29/2021, 7:12 AM

## 2021-06-29 NOTE — Progress Notes (Signed)
Physical Therapy Treatment Patient Details Name: Sherri Hart MRN: [id number] DOB: Mar 19, 1946 Today's Date: 06/29/2021   History of Present Illness Patient is 75 y.o. female s/p Rt TKR on 06/27/21 with PMH significant for OA, anxiety, A-fib, COPD, CHF, back surgery.    PT Comments    Pt continues very cooperative and progressing slowly with mobility but also continues limited by pain,fatigues easily, and highly distractible.   Recommendations for follow up therapy are one component of a multi-disciplinary discharge planning process, led by the attending physician.  Recommendations may be updated based on patient status, additional functional criteria and insurance authorization.  Follow Up Recommendations  Follow surgeon's recommendation for DC plan and follow-up therapies     Equipment Recommendations  Rolling walker with 5" wheels    Recommendations for Other Services       Precautions / Restrictions Precautions Precautions: Fall;Knee Required Braces or Orthoses: Knee Immobilizer - Right Knee Immobilizer - Right: Discontinue once straight leg raise with < 10 degree lag Restrictions Weight Bearing Restrictions: No Other Position/Activity Restrictions: WBAT     Mobility  Bed Mobility Overal bed mobility: Needs Assistance Bed Mobility: Supine to Sit;Sit to Supine     Supine to sit: Min guard Sit to supine: Mod assist   General bed mobility comments: INcreased time with cues for sequence and assist to manage R LE off bed and bil LEs onto bed    Transfers Overall transfer level: Needs assistance Equipment used: Rolling walker (2 wheeled) Transfers: Sit to/from Stand Sit to Stand: +2 safety/equipment;Min assist;Mod assist Stand pivot transfers: Min assist;+2 safety/equipment       General transfer comment: Cues for LE management and use of UEs to self assist.  Physical assist to bring wt up and fwd and to balance in standing.  Ambulation/Gait Ambulation/Gait  assistance: Min assist;Mod assist;+2 safety/equipment Gait Distance (Feet): 38 Feet Assistive device: Rolling walker (2 wheeled) Gait Pattern/deviations: Step-to pattern;Decreased step length - left;Decreased stance time - right;Shuffle;Trunk flexed Gait velocity: decr   General Gait Details: Step-by-step cues for posture, sequence, position from RW and safety with additional cues to focus pt on task and negate unsafe impulsive movements.  Distance ltd by [employer] (Stroke Patients Only)       Balance Overall balance assessment: Needs assistance Sitting-balance support: Feet supported Sitting balance-Leahy Scale: Good     Standing balance support: During functional activity;Bilateral upper extremity supported Standing balance-Leahy Scale: Poor                              Cognition Arousal/Alertness: Awake/alert Behavior During Therapy: WFL for tasks assessed/performed;Impulsive Overall Cognitive Status: Within Functional Limits for tasks assessed                                        Exercises Total Joint Exercises Ankle Circles/Pumps: AROM;Both;20 reps;Supine Quad Sets: AROM;Both;10 reps;Supine Heel Slides: AAROM;Right;Supine;15 reps Straight Leg Raises: AAROM;Right;10 reps;Supine    General Comments        Pertinent Vitals/Pain Pain Assessment: 0-10 Pain Score: 7  Pain Location: Rt knee Pain Descriptors / Indicators: Aching;Discomfort;Grimacing Pain Intervention(s): Limited activity within patient's tolerance;Monitored during session;Premedicated before session;Ice applied    Home Living  Prior Function            PT Goals (current goals can now be found in the care plan section) Acute Rehab PT Goals Patient Stated Goal: be able to walk more without pain PT Goal Formulation: With patient Time For Goal Achievement:  07/04/21 Potential to Achieve Goals: Good Progress towards PT goals: Progressing toward goals    Frequency    7X/week      PT Plan Current plan remains appropriate    Co-evaluation              AM-PAC PT "6 Clicks" Mobility   Outcome Measure  Help needed turning from your back to your side while in a flat bed without using bedrails?: A Little Help needed moving from lying on your back to sitting on the side of a flat bed without using bedrails?: A Little Help needed moving to and from a bed to a chair (including a wheelchair)?: A Lot Help needed standing up from a chair using your arms (e.g., wheelchair or bedside chair)?: A Lot Help needed to walk in hospital room?: A Lot Help needed climbing 3-5 steps with a railing? : Total 6 Click Score: 13    End of Session Equipment Utilized During Treatment: Gait belt;Right knee immobilizer Activity Tolerance: Patient limited by fatigue;Patient limited by pain Patient left: in bed;with call bell/phone within reach;with bed alarm set Nurse Communication: Mobility status PT Visit Diagnosis: Other abnormalities of gait and mobility (R26.89);Muscle weakness (generalized) (M62.81);Difficulty in walking, not elsewhere classified (R26.2);Pain Pain - Right/Left: Right Pain - part of body: Knee     Time: 1400-1425 PT Time Calculation (min) (ACUTE ONLY): 25 min  Charges:  $Gait Training: 23-37 mins $Therapeutic Exercise: 8-22 mins                     Mauro Kaufmann PT Acute Rehabilitation Services Pager [phone] Office [phone]    Larsen Dungan 06/29/2021, 2:30 PM

## 2021-06-29 NOTE — Plan of Care (Signed)
  Problem: Education: Goal: Knowledge of the prescribed therapeutic regimen will improve Outcome: Progressing   Problem: Activity: Goal: Risk for activity intolerance will decrease Outcome: Progressing   Problem: Pain Management: Goal: Pain level will decrease with appropriate interventions Outcome: Progressing   

## 2021-06-30 ENCOUNTER — Inpatient Hospital Stay (HOSPITAL_COMMUNITY): Payer: Medicare HMO

## 2021-06-30 DIAGNOSIS — T84018A Broken internal joint prosthesis, other site, initial encounter: Secondary | ICD-10-CM | POA: Diagnosis not present

## 2021-06-30 DIAGNOSIS — Z96651 Presence of right artificial knee joint: Secondary | ICD-10-CM | POA: Diagnosis not present

## 2021-06-30 DIAGNOSIS — I4891 Unspecified atrial fibrillation: Secondary | ICD-10-CM

## 2021-06-30 DIAGNOSIS — J9621 Acute and chronic respiratory failure with hypoxia: Secondary | ICD-10-CM

## 2021-06-30 DIAGNOSIS — J9811 Atelectasis: Secondary | ICD-10-CM

## 2021-06-30 DIAGNOSIS — I509 Heart failure, unspecified: Secondary | ICD-10-CM

## 2021-06-30 DIAGNOSIS — Z96659 Presence of unspecified artificial knee joint: Secondary | ICD-10-CM

## 2021-06-30 DIAGNOSIS — J441 Chronic obstructive pulmonary disease with (acute) exacerbation: Secondary | ICD-10-CM

## 2021-06-30 DIAGNOSIS — J9622 Acute and chronic respiratory failure with hypercapnia: Secondary | ICD-10-CM

## 2021-06-30 LAB — CBC
HCT: 39 % (ref 36.0–46.0)
Hemoglobin: 12.3 g/dL (ref 12.0–15.0)
MCH: 33.2 pg (ref 26.0–34.0)
MCHC: 31.5 g/dL (ref 30.0–36.0)
MCV: 105.1 fL — ABNORMAL HIGH (ref 80.0–100.0)
Platelets: 169 10*3/uL (ref 150–400)
RBC: 3.71 MIL/uL — ABNORMAL LOW (ref 3.87–5.11)
RDW: 13.7 % (ref 11.5–15.5)
WBC: 16.6 10*3/uL — ABNORMAL HIGH (ref 4.0–10.5)
nRBC: 0 % (ref 0.0–0.2)

## 2021-06-30 LAB — BASIC METABOLIC PANEL
Anion gap: 10 (ref 5–15)
BUN: 37 mg/dL — ABNORMAL HIGH (ref 8–23)
CO2: 31 mmol/L (ref 22–32)
Calcium: 8.9 mg/dL (ref 8.9–10.3)
Chloride: 95 mmol/L — ABNORMAL LOW (ref 98–111)
Creatinine, Ser: 1.28 mg/dL — ABNORMAL HIGH (ref 0.44–1.00)
GFR, Estimated: 44 mL/min — ABNORMAL LOW (ref >60)
Glucose, Bld: 87 mg/dL (ref 70–99)
Potassium: 4.8 mmol/L (ref 3.5–5.1)
Sodium: 136 mmol/L (ref 135–145)

## 2021-06-30 LAB — BLOOD GAS, ARTERIAL
Acid-Base Excess: 5.3 mmol/L — ABNORMAL HIGH (ref 0.0–2.0)
Bicarbonate: 30.4 mmol/L — ABNORMAL HIGH (ref 20.0–28.0)
FIO2: 100
O2 Saturation: 93.3 %
Patient temperature: 98.6
pCO2 arterial: 48.4 mmHg — ABNORMAL HIGH (ref 32.0–48.0)
pH, Arterial: 7.414 (ref 7.350–7.450)
pO2, Arterial: 66.7 mmHg — ABNORMAL LOW (ref 83.0–108.0)

## 2021-06-30 MED ORDER — ALBUTEROL SULFATE (2.5 MG/3ML) 0.083% IN NEBU: 2.5000 mg | INHALATION_SOLUTION | RESPIRATORY_TRACT | Status: DC | PRN

## 2021-06-30 MED ORDER — ORAL CARE MOUTH RINSE
15.0000 mL | Freq: Two times a day (BID) | OROMUCOSAL | Status: DC
  Administered 2021-06-30 – 2021-07-06 (×9): 15 mL via OROMUCOSAL

## 2021-06-30 MED ORDER — METHYLPREDNISOLONE SODIUM SUCC 125 MG IJ SOLR
60.0000 mg | Freq: Once | INTRAMUSCULAR | Status: AC
  Administered 2021-06-30: 60 mg via INTRAVENOUS
  Filled 2021-06-30: qty 2

## 2021-06-30 MED ORDER — FUROSEMIDE 10 MG/ML IJ SOLN
40.0000 mg | Freq: Once | INTRAMUSCULAR | Status: AC
  Administered 2021-06-30: 40 mg via INTRAVENOUS
  Filled 2021-06-30: qty 4

## 2021-06-30 MED ORDER — MORPHINE SULFATE (PF) 4 MG/ML IV SOLN
4.0000 mg | Freq: Once | INTRAVENOUS | Status: AC
  Administered 2021-06-30: 4 mg via INTRAVENOUS
  Filled 2021-06-30: qty 1

## 2021-06-30 MED ORDER — RIVAROXABAN 10 MG PO TABS
20.0000 mg | ORAL_TABLET | Freq: Every day | ORAL | Status: DC
  Administered 2021-07-01 – 2021-07-06 (×6): 20 mg via ORAL
  Filled 2021-06-30: qty 1
  Filled 2021-06-30: qty 2
  Filled 2021-06-30: qty 1
  Filled 2021-06-30: qty 2
  Filled 2021-06-30 (×2): qty 1

## 2021-06-30 MED ORDER — DILTIAZEM HCL 25 MG/5ML IV SOLN
10.0000 mg | Freq: Four times a day (QID) | INTRAVENOUS | Status: DC | PRN
  Administered 2021-06-30: 10 mg via INTRAVENOUS
  Filled 2021-06-30 (×2): qty 5

## 2021-06-30 NOTE — Progress Notes (Signed)
PT Cancellation Note  Patient Details Name: Sherri Hart MRN: [id number] DOB: 03/16/1946   Cancelled Treatment:     PT pm session deferred - RN advises pt now I afib with RVR and transferring to tele.  Will follow.   Yevonne Yokum 06/30/2021, 4:16 PM

## 2021-06-30 NOTE — Progress Notes (Signed)
RT NOTE:  RT called for stat visit due to respiratory distress. Pt on 15L NRB when RT entered the room with an elevated respiratory rate. PRN albuterol was given. Rapid response RN ordered an ABG which was obtained and sent to lab. Pt being moved to ICU level bed for closer monitoring.

## 2021-06-30 NOTE — Progress Notes (Signed)
Brief cardiology note  I received a call from the orthopedic MD regarding patient's Afib with RVR.  Patient is at [facility] post total knee arthroplasty. She has hypoxic and hypercapnic respiratory failure and is requiring nonrebreather mask for acute decompensation. EKG shows Afib in RVE with HR 110-120s. BP is stable. Given patient's respiratory failure, she is being transferred to ICU for closer monitoring.  His heart rate will improve with treatment of his respiratory failure and pain control. Agree with hospitalist's plan to initiate diltiazem drip for now. Advise adding oral metoprolol 12.5 mg bid and wean off diltiazem. Heart rate 110-120s is acceptable. Obtain echocardiogram. Replete electrolytes as needed. Please call me back for any concerns/change in clinical status.

## 2021-06-30 NOTE — Plan of Care (Signed)
  Problem: Pain Management: Goal: Pain level will decrease with appropriate interventions Outcome: Progressing   

## 2021-06-30 NOTE — Progress Notes (Signed)
Physical Therapy Treatment Patient Details Name: Sherri Hart MRN: [id number] DOB: Mar 20, 1946 Today's Date: 06/30/2021   History of Present Illness Patient is 75 y.o. female s/p Rt TKR on 06/27/21 with PMH significant for OA, anxiety, A-fib, COPD, CHF, back surgery.    PT Comments    Pt continues very cooperative but limited by SOB/pain with activity and also with marked premorbid deconditioning.  Pt initial plan was to dc to home with granddaughter but slow progress, 10 stairs to enter home, and granddaughters limited ability to assist pt suggest dc to SNF level rehab to maximize safety and IND prior to return home may be more appropriate option.   Recommendations for follow up therapy are one component of a multi-disciplinary discharge planning process, led by the attending physician.  Recommendations may be updated based on patient status, additional functional criteria and insurance authorization.  Follow Up Recommendations  Follow surgeon's recommendation for DC plan and follow-up therapies     Equipment Recommendations  Rolling walker with 5" wheels    Recommendations for Other Services       Precautions / Restrictions Precautions Precautions: Fall;Knee Required Braces or Orthoses: Knee Immobilizer - Right Knee Immobilizer - Right: Discontinue once straight leg raise with < 10 degree lag Restrictions Weight Bearing Restrictions: No RLE Weight Bearing: Weight bearing as tolerated     Mobility  Bed Mobility Overal bed mobility: Needs Assistance Bed Mobility: Supine to Sit     Supine to sit: Min guard     General bed mobility comments: Increased time with pt self assisting R LE with UEs    Transfers Overall transfer level: Needs assistance Equipment used: Rolling walker (2 wheeled) Transfers: Sit to/from Stand Sit to Stand: +2 safety/equipment;Min assist;Mod assist         General transfer comment: Cues for LE management and use of UEs to self assist.   Physical assist to bring wt up and fwd and to balance in standing.  Ambulation/Gait Ambulation/Gait assistance: Min assist;Mod assist;+2 safety/equipment Gait Distance (Feet): 16 Feet Assistive device: Rolling walker (2 wheeled) Gait Pattern/deviations: Step-to pattern;Decreased step length - left;Decreased stance time - right;Shuffle;Trunk flexed Gait velocity: decr   General Gait Details: Step-by-step cues for posture, sequence, position from RW and safety with additional cues to focus pt on task and negate unsafe impulsive movements.  Distance ltd by [employer] (Stroke Patients Only)       Balance Overall balance assessment: Needs assistance Sitting-balance support: Feet supported Sitting balance-Leahy Scale: Good     Standing balance support: During functional activity;Bilateral upper extremity supported Standing balance-Leahy Scale: Poor                              Cognition Arousal/Alertness: Awake/alert Behavior During Therapy: WFL for tasks assessed/performed;Impulsive Overall Cognitive Status: Within Functional Limits for tasks assessed                                        Exercises Total Joint Exercises Ankle Circles/Pumps: AROM;Both;20 reps;Supine Quad Sets: AROM;Both;10 reps;Supine Heel Slides: AAROM;Right;Supine;15 reps Straight Leg Raises: AAROM;Right;10 reps;Supine    General Comments        Pertinent Vitals/Pain Pain Assessment: 0-10 Pain Score: 7  Pain Location: Rt knee Pain  Descriptors / Indicators: Aching;Discomfort;Grimacing Pain Intervention(s): Limited activity within patient's tolerance;Monitored during session;Premedicated before session;Ice applied    Home Living                      Prior Function            PT Goals (current goals can now be found in the care plan section) Acute Rehab PT Goals Patient Stated Goal: be  able to walk more without pain PT Goal Formulation: With patient Time For Goal Achievement: 07/04/21 Potential to Achieve Goals: Good Progress towards PT goals: Not progressing toward goals - comment    Frequency    7X/week      PT Plan Current plan remains appropriate    Co-evaluation              AM-PAC PT "6 Clicks" Mobility   Outcome Measure  Help needed turning from your back to your side while in a flat bed without using bedrails?: A Little Help needed moving from lying on your back to sitting on the side of a flat bed without using bedrails?: A Little Help needed moving to and from a bed to a chair (including a wheelchair)?: A Lot Help needed standing up from a chair using your arms (e.g., wheelchair or bedside chair)?: A Lot Help needed to walk in hospital room?: A Lot Help needed climbing 3-5 steps with a railing? : Total 6 Click Score: 13    End of Session Equipment Utilized During Treatment: Gait belt;Right knee immobilizer Activity Tolerance: Patient limited by fatigue;Patient limited by pain Patient left: in chair;with call bell/phone within reach;with chair alarm set Nurse Communication: Mobility status PT Visit Diagnosis: Other abnormalities of gait and mobility (R26.89);Muscle weakness (generalized) (M62.81);Difficulty in walking, not elsewhere classified (R26.2);Pain Pain - Right/Left: Right Pain - part of body: Knee     Time: 9242-6834 PT Time Calculation (min) (ACUTE ONLY): 28 min  Charges:  $Gait Training: 8-22 mins $Therapeutic Exercise: 8-22 mins                     Mauro Kaufmann PT Acute Rehabilitation Services Pager [phone] Office [phone]    Brallan Denio 06/30/2021, 12:41 PM

## 2021-06-30 NOTE — Consult Note (Addendum)
[facility] Medical Consultation  Dalina Samara Block BMW:[id number] DOB: 06-18-1946 DOA: 06/27/2021 PCP: Ellyn Hack, MD   Requesting physician: Ollen Gross, MD  Date of consultation: 06/30/2021   Reason for consultation: Shortness of breath.  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  Currently on nonrebreather mask.  Patient has significant pain limiting her ventilation.  X-ray shows some atelectasis.  She would benefit from incentive spirometry deep breaths and adequate pain control.  We will focus on pain management.  Continue treatment for COPD and CHF.  Failed total knee arthroplasty.  Status post revision surgery.  Management as per primary team.  On knee immobilizer at this time  Permanent atrial fibrillation/atrial fibrillation RVR.  Patient is on Xarelto as outpatient.  Will need to be resumed.  Cardiology will be consulted.  Heart rate is being exacerbated by sympathetic drive from excessive pain.  Will need better pain control.  We will add Cardizem IV as needed for heart rate less than 120  Acute COPD exacerbation.  Patient is on home oxygen at 4 L/min but was not really compliant at home.  We will continue bronchodilators 4 hourly, was initially wheezing so we will add steroid 60 mg x 1.   History of diastolic congestive heart failure.  Minimally negative balance so we will give 1 dose of IV Lasix for now.  TRH team will followup again tomorrow. Please contact me if I can be of assistance in the meanwhile. Thank you for this consultation.  Chief Complaint: Shortness of breath  HPI:  Patient is a 75 years old female with past medical history of right total knee arthroplasty which had  failed.  Patient was admitted hospital for revision arthroplasty by orthopedics team.  Patient does have baseline shortness of breath and is on 4 L of oxygen at home though she is not quite compliant with it.  Recently patient has been having increasing oxygen demand especially today and has been complaining of severe pain in the right knee.  Has been having some cough but no fever chills or rigor.  No concern of nausea vomiting.  States that she has no trouble swallowing.  No concern for aspiration.    Patient was also noted to have atrial fibrillation with RVR so hospitalist team was consulted for further evaluation and treatment.  Review of Systems:  All systems were reviewed and were negative except as in the history of presenting illness  Past Medical History:  Diagnosis Date   Anxiety    Arthritis    Atrial fibrillation (HCC)    Complication of anesthesia    used to get migraine headaches after surgery   Congestive heart failure (HCC)    COPD (chronic obstructive pulmonary disease) (HCC)    Dyspnea    if afib causes a problem   Pneumonia    Past Surgical History:  Procedure Laterality Date   BACK SURGERY     CHOLECYSTECTOMY     NECK SURGERY     REPLACEMENT TOTAL KNEE Right    TOTAL KNEE REVISION Right 06/27/2021   Procedure: TOTAL KNEE REVISION;  Surgeon: Ollen Gross, MD;  Location: [facility];  Service: Orthopedics;  Laterality: Right;   TUBAL LIGATION     Social History:  reports that she  has been smoking cigarettes. She has a 26.50 pack-year smoking history. She has never used smokeless tobacco. She reports that she does not drink alcohol and does not use drugs.  Allergies  Allergen Reactions   Penicillins     ++ Pt stated that she can take keflex++  Did it involve swelling of the face/tongue/throat, SOB, or low BP? Yes Did it involve sudden or severe rash/hives, skin peeling, or any reaction on the inside of your mouth or nose? N Did you need to seek medical attention at  a hospital or doctor's office? Y When did it last happen? childhood      If all above answers are "NO", may proceed with cephalosporin use.    Family History  Problem Relation Age of Onset   COPD Other     Prior to Admission medications   Medication Sig Start Date End Date Taking? Authorizing Provider  albuterol (VENTOLIN HFA) 108 (90 Base) MCG/ACT inhaler Inhale 2 puffs into the lungs every 6 (six) hours as needed for wheezing or shortness of breath. 02/04/21  Yes Gonfa, Boyce Medici, MD  diltiazem (CARDIZEM CD) 180 MG 24 hr capsule Take 1 capsule (180 mg total) by mouth daily. 02/04/21  Yes Almon Hercules, MD  furosemide (LASIX) 40 MG tablet Take 1 tablet (40 mg total) by mouth daily. 02/04/21  Yes Almon Hercules, MD  loratadine (CLARITIN) 10 MG tablet Take 10 mg by mouth daily as needed for allergies.   Yes [provider]  rivaroxaban (XARELTO) 20 MG TABS tablet Take 1 tablet (20 mg total) by mouth daily with supper. 05/31/21  Yes Azalee Course, PA  methocarbamol (ROBAXIN) 500 MG tablet Take 1 tablet (500 mg total) by mouth every 6 (six) hours as needed for muscle spasms. 06/28/21   Nelia Shi D, PA-C  oxyCODONE (OXY IR/ROXICODONE) 5 MG immediate release tablet Take 1-2 tablets (5-10 mg total) by mouth every 4 (four) hours as needed for severe pain. 06/28/21   Nelia Shi D, PA-C  traMADol (ULTRAM) 50 MG tablet Take 1-2 tablets (50-100 mg total) by mouth every 6 (six) hours as needed for moderate pain. 06/28/21   Alyssa Grove, PA-C    Physical Exam: Blood pressure 119/82, pulse 87, temperature (!) 97.4 F (36.3 C), resp. rate 17, height 5\' 5"  (1.651 m), weight 108.4 kg, SpO2 90 %. Vitals:   06/30/21 0750 06/30/21 1329  BP:    Pulse:    Resp:    Temp:    SpO2: 95% 90%   Body mass index is 39.77 kg/m.   Intake/Output Summary (Last 24 hours) at 06/30/2021 1738 Last data filed at 06/30/2021 1239 Gross per 24 hour  Intake 1060 ml  Output 1350 ml  Net -290 ml     General: Obese built, in moderate respiratory distress, on nonrebreather mask. HENT: Normocephalic, pupils equally reacting to light and accommodation.  No scleral pallor or icterus noted. Oral mucosa is moist.  Chest:   Diminished breath sounds bilaterally.  CVS: S1 &S2 heard.  Irregular rhythm with tachycardia. Abdomen: Soft, nontender, nondistended.   Extremities: No cyanosis, clubbing or edema.  Right knee in an immobilizer. Psych: Alert, awake and communicative but in moderate distress, anxious  CNS: Moving all extremities.     Skin: Warm and dry.  No rashes noted.  Laboratory Data:  CBC: Recent Labs  Lab 06/28/21 0320 06/29/21 0321 06/30/21 0326  WBC 14.0* 19.3* 16.6*  HGB 13.4 12.7 12.3  HCT 42.3 38.1 39.0  MCV 103.2* 100.8* 105.1*  PLT 186 170 169    Basic Metabolic Panel: Recent Labs  Lab 06/28/21 0320 06/29/21 0359  NA 136 135  K 4.5 4.4  CL 102 99  CO2 25 28  GLUCOSE 181* 162*  BUN 18 34*  CREATININE 1.03* 1.26*  CALCIUM 8.7* 9.0    Liver Function Tests: No results for input(s): AST, ALT, ALKPHOS, BILITOT, PROT, ALBUMIN in the last 168 hours. No results for input(s): LIPASE, AMYLASE in the last 168 hours. No results for input(s): AMMONIA in the last 168 hours.  Cardiac Enzymes: No results for input(s): CKTOTAL, CKMB, CKMBINDEX, TROPONINI in the last 168 hours.  BNP: Invalid input(s): POCBNP  CBG: No results for input(s): GLUCAP in the last 168 hours.  Radiological Exams on Admission: DG Chest Port 1 View  Result Date: 06/30/2021 CLINICAL DATA:  Acute respiratory distress. Knee surgery 3 days ago. EXAM: PORTABLE CHEST 1 VIEW COMPARISON:  Chest radiograph 06/22/2021.  CT 10/04/2020 FINDINGS: Upper normal heart size, stable allowing for differences in technique. Unchanged mediastinal contours. Aortic atherosclerosis. Bandlike streaky opacity at both lung bases typical of atelectasis. Mild emphysema and peribronchial thickening. There is no  significant pleural effusion. No visualized pneumothorax. Surgical hardware in the lower cervical spine. IMPRESSION: 1. Bandlike streaky opacity at both lung bases typical of atelectasis. 2. Mild emphysema and chronic peribronchial thickening. Electronically Signed   By: Narda Rutherford M.D.   On: 06/30/2021 17:34    EKG: Independently reviewed.  Atrial fibrillation with RVR  Time spent: 59 minutes  Taralynn Quiett [facility] 06/30/2021

## 2021-06-30 NOTE — Significant Event (Signed)
Rapid Response Event Note   Reason for Call : A Fib RVR Increased WOB    Initial Focused Assessment:  Patient sitting up in chair on 6 liters of oxygen. O2 sats at 79-80 % NRB in place at 15 % , Patient A/ O x 4, moaning, reports pain scale at 10. Assisted back to bed with 4 assistance.   Interventions:   CXR Breathing Treatment  Transfer to SD Unit  Consult [facility] Cards    Plan of Care:  [facility] consulted Cards Consulted.  Transfer to SD unit      Event Summary:   MD Notified:  1700 Call Time:   1715 Arrival Time: 1720  End Time:  Sharyn Lull Parrish Bonn, RN

## 2021-06-30 NOTE — TOC Initial Note (Signed)
Transition of Care [facility]) - Initial/Assessment Note    Patient Details  Name: Sherri Hart MRN: [id number] Date of Birth: 1946/01/25  [facility] of [facility] CM/SW Contact:    Lennart Pall, LCSW Phone Number: 06/30/2021, 2:41 PM  Clinical Narrative:                 Met with pt yesterday and today to review dc plans.  Pt very much wants to avoid need for SNF rehab and hopeful to return home. Notes she has an adult granddaughter who lives with her and plans to take time from work to assist at [employer].  Pt aware she is making slow gains but gains nonetheless and PA notes may be ready for dc beginning of the week.  Pt notes she does have a rw at home but in need of bedside commode.  Orders placed and no agency preference with referral made to [place] (delivery to [place] room).  Initial plans were for pt to have OPPT, however, may need to change to HHPT if mobility not improved significantly (PA aware.)  TOC will continue to follow.  Expected Discharge Plan: [employer] Barriers to Discharge: Continued Medical Work up   Patient Goals and CMS Choice Patient states their goals for this hospitalization and ongoing recovery are:: return home      Expected Discharge Plan and Services Expected Discharge Plan: [place] In-house Referral: Clinical Social Work     Living arrangements for the past 2 months: [place] Expected Discharge Date: 06/29/21               DME Arranged: 3-N-1 DME Agency: [employer] Date DME Agency Contacted: 06/30/21 Time DME Agency Contacted: [phone] Representative spoke with at DME Agency: Freda Munro            Prior Living Arrangements/Services Living arrangements for the past 2 months: [place] with:: Relatives (adult granddaughter) Patient language and need for interpreter reviewed:: Yes Do you feel safe going back to the place where you live?: Yes      Need for Family Participation in Patient Care:  Yes (Comment) Care giver support system in place?: Yes (comment)   Criminal Activity/Legal Involvement Pertinent to Current Situation/Hospitalization: No - Comment as needed  Activities of Daily Living Home Assistive Devices/Equipment: Cane (specify quad or straight), Walker (specify type) ADL Screening (condition at time of admission) Patient's cognitive ability adequate to safely complete daily activities?: Yes Is the patient deaf or have difficulty hearing?: Yes Does the patient have difficulty seeing, even when wearing glasses/contacts?: No Does the patient have difficulty concentrating, remembering, or making decisions?: No Patient able to express need for assistance with ADLs?: Yes Does the patient have difficulty dressing or bathing?: No Independently performs ADLs?: Yes (appropriate for developmental age) Does the patient have difficulty walking or climbing stairs?: No Weakness of Legs: None Weakness of Arms/Hands: None  Permission Sought/Granted                  Emotional Assessment Appearance:: Appears stated age Attitude/Demeanor/Rapport: Engaged, Gracious Affect (typically observed): Accepting Orientation: : Oriented to Self, Oriented to Place, Oriented to  Time, Oriented to Situation Alcohol / Substance Use: Not Applicable Psych Involvement: No (comment)  Admission diagnosis:  Status post revision of total knee replacement, right [Z96.651] Patient Active Problem List   Diagnosis Date Noted   Failed total knee arthroplasty ([place]) 06/27/2021   Status post revision of total knee replacement, right  06/27/2021   Atrial fibrillation with RVR ([place]) 40/99/2780   Acute diastolic (congestive) heart failure ([place]) 07/05/2020   Elevated troponin level not due myocardial infarction 07/04/2020   Hypomagnesemia 07/04/2020   Atrial fibrillation with rapid ventricular response ([place]) 07/04/2020   Acute cardiogenic pulmonary edema ([place]) 07/04/2020   COPD with acute exacerbation  ([place]) 07/04/2020   Nicotine dependence, cigarettes, uncomplicated 04/47/1580   PCP:  Roselee Nova, MD Pharmacy:   [facility] [place], [place] [address] [place] [address] Phone: [phone] Fax: [place], [place] [address] Phone: [phone] Fax: [phone]     Social Determinants of Health (SDOH) Interventions    Readmission Risk Interventions No flowsheet data found.

## 2021-06-30 NOTE — Plan of Care (Signed)
  Problem: Education: Goal: Knowledge of the prescribed therapeutic regimen will improve Outcome: Progressing   Problem: Activity: Goal: Range of joint motion will improve Outcome: Progressing   Problem: Pain Management: Goal: Pain level will decrease with appropriate interventions Outcome: Progressing   

## 2021-07-01 DIAGNOSIS — I4891 Unspecified atrial fibrillation: Secondary | ICD-10-CM | POA: Diagnosis not present

## 2021-07-01 DIAGNOSIS — L8990 Pressure ulcer of unspecified site, unspecified stage: Secondary | ICD-10-CM | POA: Diagnosis present

## 2021-07-01 DIAGNOSIS — Z96651 Presence of right artificial knee joint: Secondary | ICD-10-CM | POA: Diagnosis not present

## 2021-07-01 DIAGNOSIS — J441 Chronic obstructive pulmonary disease with (acute) exacerbation: Secondary | ICD-10-CM | POA: Diagnosis not present

## 2021-07-01 DIAGNOSIS — T84018A Broken internal joint prosthesis, other site, initial encounter: Secondary | ICD-10-CM | POA: Diagnosis not present

## 2021-07-01 LAB — PHOSPHORUS: Phosphorus: 4.2 mg/dL (ref 2.5–4.6)

## 2021-07-01 LAB — BASIC METABOLIC PANEL
Anion gap: 9 (ref 5–15)
BUN: 39 mg/dL — ABNORMAL HIGH (ref 8–23)
CO2: 33 mmol/L — ABNORMAL HIGH (ref 22–32)
Calcium: 8.5 mg/dL — ABNORMAL LOW (ref 8.9–10.3)
Chloride: 93 mmol/L — ABNORMAL LOW (ref 98–111)
Creatinine, Ser: 1.23 mg/dL — ABNORMAL HIGH (ref 0.44–1.00)
GFR, Estimated: 46 mL/min — ABNORMAL LOW (ref >60)
Glucose, Bld: 184 mg/dL — ABNORMAL HIGH (ref 70–99)
Potassium: 4.2 mmol/L (ref 3.5–5.1)
Sodium: 135 mmol/L (ref 135–145)

## 2021-07-01 LAB — CBC
HCT: 39.1 % (ref 36.0–46.0)
Hemoglobin: 12.5 g/dL (ref 12.0–15.0)
MCH: 32.8 pg (ref 26.0–34.0)
MCHC: 32 g/dL (ref 30.0–36.0)
MCV: 102.6 fL — ABNORMAL HIGH (ref 80.0–100.0)
Platelets: 177 10*3/uL (ref 150–400)
RBC: 3.81 MIL/uL — ABNORMAL LOW (ref 3.87–5.11)
RDW: 13.7 % (ref 11.5–15.5)
WBC: 13 10*3/uL — ABNORMAL HIGH (ref 4.0–10.5)
nRBC: 0 % (ref 0.0–0.2)

## 2021-07-01 LAB — MAGNESIUM: Magnesium: 1.9 mg/dL (ref 1.7–2.4)

## 2021-07-01 MED ORDER — ALBUTEROL SULFATE (2.5 MG/3ML) 0.083% IN NEBU
2.5000 mg | INHALATION_SOLUTION | Freq: Two times a day (BID) | RESPIRATORY_TRACT | Status: DC
  Administered 2021-07-01 – 2021-07-06 (×10): 2.5 mg via RESPIRATORY_TRACT
  Filled 2021-07-01 (×10): qty 3

## 2021-07-01 MED ORDER — CHLORHEXIDINE GLUCONATE CLOTH 2 % EX PADS
6.0000 | MEDICATED_PAD | Freq: Every day | CUTANEOUS | Status: DC
  Administered 2021-07-01 – 2021-07-04 (×4): 6 via TOPICAL

## 2021-07-01 NOTE — Progress Notes (Signed)
Orthopedic Tech Progress Note Patient Details:  Sherri Hart Block [birth year] [id number]  Patient ID: Sherri Hart, female   DOB: 11-Nov-1945, 75 y.o.   MRN: [id number]  Sherri Hart 07/01/2021, 2:16 PMpickup CPM

## 2021-07-01 NOTE — Progress Notes (Addendum)
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  Patient was admitted hospital for revision arthroplasty by orthopedics team.  Patient does have baseline shortness of breath and is on 4 L of oxygen at home though she is not quite compliant with it.  Recently patient has been having increasing oxygen demand especially today and has been complaining of severe pain in the right knee.  Has been having some cough but no fever chills or rigor.  No concern of nausea vomiting.  States that she has no trouble swallowing.  No concern for aspiration.    Patient was also noted to have atrial fibrillation with RVR so hospitalist team was consulted for further evaluation and treatment.  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 Initially required nonrebreather mask.  Currently on nasal cannula.  We will continue to wean as able.  She was encouraged incentive spirometry deep breathing.  Pain is under better control today so she feels better.  No need for further Solu-Medrol but continue with bronchodilators.   Failed total knee arthroplasty.  Status post revision surgery.  Management as per primary team.  Continue knee immobilizer.   Permanent atrial fibrillation with RVR.   Improved after pain management.  Continue Cardizem and Xarelto.   Acute COPD exacerbation.  Patient is on home oxygen at 4 L/min but was not really compliant at home.  Continue bronchodilators.   Improved   History of diastolic congestive heart failure.  No crackles.  No significant edema.  Received IV Lasix yesterday.  Continue oral Lasix from home.  Pressure ulceration stage I present on admission.  Preventive protocol. Pressure Injury 07/01/21 Pretibial Right;Lateral Stage 1 -  Intact skin with non-blanchable redness of a localized area usually over a bony prominence. Thin red nonblanchable line from ice pack (Active)  07/01/21 0800  Location: Pretibial  Location Orientation: Right;Lateral  Staging: Stage 1 -  Intact skin with non-blanchable redness of a localized area usually over a bony prominence.  Wound Description (Comments): Thin red nonblanchable line from ice pack  Present on Admission: No        DVT prophylaxis: SCDs Start: 06/27/21 1456 Place TED hose Start: 06/27/21 1456 rivaroxaban (XARELTO) tablet 20 mg    Code Status: Full code  Family Communication: Spoke with the patient's granddaughter at bedside  Status is: Inpatient  Remains inpatient appropriate because: Status post knee replacement,  Procedures: Right knee revision surgery  Anti-infectives:  None  Subjective: Today, patient was seen and examined at bedside.  Patient feels much better with breathing today.  Nursing staff reported that she was little anxious yesterday.  Has mild cough but no overt wheezing.  Objective: Vitals:   07/01/21 0800 07/01/21 0827  BP: 126/78   Pulse: 97   Resp: 10   Temp:  (!) 97.4 F (36.3 C)  SpO2: (!) 89%     Intake/Output Summary (Last 24 hours) at 07/01/2021 1025 Last data filed at 07/01/2021 0300 Gross per 24 hour  Intake --  Output 900 ml  Net -900 ml   Filed Weights   06/27/21 0944  Weight: 108.4 kg   Body mass index is 39.77 kg/m.   Physical Exam: General: Obese built, not in distress, on nasal cannula oxygen. HENT: Normocephalic, pupils equally reacting to light and accommodation.  No scleral pallor or icterus noted. Oral mucosa is  moist.  Chest:   Diminished breath sounds bilaterally.  No obvious crackles or wheezes noted. CVS: S1 &S2 heard.  Irregular rhythm  Abdomen: Soft, nontender, nondistended.   Extremities: No cyanosis, clubbing or edema.  Right knee in an immobilizer. Psych: Alert, awake and oriented,  CNS: Moving all extremities.    Nonfocal. Skin: Warm and dry.  No rashes noted.  Data Review: I have personally reviewed the following laboratory data and studies,  CBC: Recent Labs  Lab 06/28/21 0320 06/29/21 0321 06/30/21 0326 07/01/21 0249  WBC 14.0* 19.3* 16.6* 13.0*  HGB 13.4 12.7 12.3 12.5  HCT 42.3 38.1 39.0 39.1  MCV 103.2* 100.8* 105.1* 102.6*  PLT 186 170 169 177   Basic Metabolic Panel: Recent Labs  Lab 06/28/21 0320 06/29/21 0359 06/30/21 1811 07/01/21 0249  NA 136 135 136 135  K 4.5 4.4 4.8 4.2  CL 102 99 95* 93*  CO2 25 28 31  33*  GLUCOSE 181* 162* 87 184*  BUN 18 34* 37* 39*  CREATININE 1.03* 1.26* 1.28* 1.23*  CALCIUM 8.7* 9.0 8.9 8.5*  MG  --   --   --  1.9  PHOS  --   --   --  4.2   Liver Function Tests: No results for input(s): AST, ALT, ALKPHOS, BILITOT, PROT, ALBUMIN in the last 168 hours. No results for input(s): LIPASE, AMYLASE in the last 168 hours. No results for input(s): AMMONIA in the last 168 hours. Cardiac Enzymes: No results for input(s): CKTOTAL, CKMB, CKMBINDEX, TROPONINI in the last 168 hours. BNP (last 3 results) Recent Labs    07/04/20 1904 02/01/21 1255  BNP 503.9* 258.1*    ProBNP (last 3 results) No results for input(s): PROBNP in the last 8760 hours.  CBG: No results for input(s): GLUCAP in the last 168 hours. Recent Results (from the past 240 hour(s))  SARS Coronavirus 2 by RT PCR (hospital order, performed in [facility] hospital lab) Nasopharyngeal Nasopharyngeal Swab     Status: None   Collection Time: 06/27/21  9:26 AM   Specimen: Nasopharyngeal Swab  Result Value Ref Range Status   SARS Coronavirus 2 NEGATIVE NEGATIVE Final     Comment: (NOTE) SARS-CoV-2 target nucleic acids are NOT DETECTED.  The SARS-CoV-2 RNA is generally detectable in upper and lower respiratory specimens during the acute phase of infection. The lowest concentration of SARS-CoV-2 viral copies this assay can detect is 250 copies / mL. A negative result does not preclude SARS-CoV-2 infection and should not be used as the sole basis for treatment or other patient management decisions.  A negative result may occur with improper specimen collection / handling, submission of specimen other than nasopharyngeal swab, presence of viral mutation(s) within the areas targeted by this assay, and inadequate number of viral copies (<250 copies / mL). A negative result must be combined with clinical observations, patient history, and epidemiological information.  Fact Sheet for Patients:   06/29/21  Fact Sheet for Healthcare Providers: BoilerBrush.com.cy  This test is not yet approved or  cleared by the https://pope.com/ FDA and has been authorized for detection and/or diagnosis of SARS-CoV-2  by FDA under an Emergency Use Authorization (EUA).  This EUA will remain in effect (meaning this test can be used) for the duration of the COVID-19 declaration under Section 564(b)(1) of the Act, 21 U.S.C. section 360bbb-3(b)(1), unless the authorization is terminated or revoked sooner.  Performed at [facility], [address], [place], [place] [address]      Studies: DG Chest Port 1 View  Result Date: 06/30/2021 CLINICAL DATA:  Acute respiratory distress. Knee surgery 3 days ago. EXAM: PORTABLE CHEST 1 VIEW COMPARISON:  Chest radiograph 06/22/2021.  CT 10/04/2020 FINDINGS: Upper normal heart size, stable allowing for differences in technique. Unchanged mediastinal contours. Aortic atherosclerosis. Bandlike streaky opacity at both lung bases typical of atelectasis. Mild emphysema and  peribronchial thickening. There is no significant pleural effusion. No visualized pneumothorax. Surgical hardware in the lower cervical spine. IMPRESSION: 1. Bandlike streaky opacity at both lung bases typical of atelectasis. 2. Mild emphysema and chronic peribronchial thickening. Electronically Signed   By: Narda Rutherford M.D.   On: 06/30/2021 17:34      Joycelyn Das, MD  [facility] 07/01/2021  If 7PM-7AM, please contact night-coverage

## 2021-07-01 NOTE — Progress Notes (Signed)
Subjective: 4 Days Post-Op Procedure(s) (LRB): TOTAL KNEE REVISION (Right) Patient reports pain as moderate.   Seen in rounds for [place] Pt got transferred to ICU yesterday due to Afib and RVR Reports no events overnight and is feeling well this am.  Objective: Vital signs in last 24 hours: Temp:  [97.4 F (36.3 C)-98.3 F (36.8 C)] 97.4 F (36.3 C) (10/23 0827) Pulse Rate:  [88-122] 97 (10/23 0800) Resp:  [10-18] 10 (10/23 0800) BP: (111-153)/(57-102) 126/78 (10/23 0800) SpO2:  [89 %-100 %] 89 % (10/23 0800) FiO2 (%):  [100 %] 100 % (10/22 1730)  Intake/Output from previous day: 10/22 0701 - 10/23 0700 In: 0  Out: 900 [Urine:900] Intake/Output this shift: No intake/output data recorded.  Recent Labs    06/29/21 0321 06/30/21 0326 07/01/21 0249  HGB 12.7 12.3 12.5   Recent Labs    06/30/21 0326 07/01/21 0249  WBC 16.6* 13.0*  RBC 3.71* 3.81*  HCT 39.0 39.1  PLT 169 177   Recent Labs    06/30/21 1811 07/01/21 0249  NA 136 135  K 4.8 4.2  CL 95* 93*  CO2 31 33*  BUN 37* 39*  CREATININE 1.28* 1.23*  GLUCOSE 87 184*  CALCIUM 8.9 8.5*   No results for input(s): LABPT, INR in the last 72 hours.  Neurologically intact Neurovascular intact Sensation intact distally Intact pulses distally Dorsiflexion/Plantar flexion intact Incision: dressing C/D/I Compartment soft   Assessment/Plan: 4 Days Post-Op Procedure(s) (LRB): TOTAL KNEE REVISION (Right) Unfortunately patient went into Afib with RVR yesterday, so PT was cancelled Hopefully patient starts to improve today. Hospitalist is on board and following  We would like to get her back with PT to continue progress in ambulation    Ceasar Mons [phone] 07/01/2021, 9:18 AM

## 2021-07-01 NOTE — Progress Notes (Signed)
Physical Therapy Treatment Patient Details Name: Sherri Hart MRN: [id number] DOB: [birth year] Today's Date: 07/01/2021   History of Present Illness Patient is 75 y.o. female s/p Rt TKR on 06/27/21 with PMH significant for OA, anxiety, A-fib, COPD, CHF, back surgery.  06/30/21 Rapid response called with pt in afib with RVR and pt transferred to step-down unit.    PT Comments    Pt continues cooperative and with noted improvement in activity tolerance, focus on task and quality of movement this date.  However, pt continues to require increased time, rest breaks and significant assist for safe performance of all basic mobility tasks.   Recommendations for follow up therapy are one component of a multi-disciplinary discharge planning process, led by the attending physician.  Recommendations may be updated based on patient status, additional functional criteria and insurance authorization.  Follow Up Recommendations  Follow surgeon's recommendation for DC plan and follow-up therapies     Equipment Recommendations  Rolling walker with 5" wheels    Recommendations for Other Services       Precautions / Restrictions Precautions Precautions: Fall;Knee Required Braces or Orthoses: Knee Immobilizer - Right Knee Immobilizer - Right: Discontinue once straight leg raise with < 10 degree lag Restrictions Weight Bearing Restrictions: No RLE Weight Bearing: Weight bearing as tolerated Other Position/Activity Restrictions: WBAT     Mobility  Bed Mobility Overal bed mobility: Needs Assistance Bed Mobility: Sit to Supine       Sit to supine: Min assist;Mod assist   General bed mobility comments: Increased time with cues for sequence and assist with LEs    Transfers Overall transfer level: Needs assistance Equipment used: Rolling walker (2 wheeled) Transfers: Sit to/from Stand Sit to Stand: Min assist         General transfer comment: Cues for LE management and use of UEs to  self assist.  Physical assist to bring wt up and fwd and to balance in standing.  Ambulation/Gait Ambulation/Gait assistance: Min assist;+2 safety/equipment Gait Distance (Feet): 52 Feet Assistive device: Rolling walker (2 wheeled) Gait Pattern/deviations: Step-to pattern;Decreased step length - left;Decreased stance time - right;Shuffle;Trunk flexed Gait velocity: decr   General Gait Details: cues for posture, sequence, position from RW and safety with additional cues to focus pt on task and negate unsafe impulsive movements.  Distance ltd by [employer] Rankin (Stroke Patients Only)       Balance Overall balance assessment: Needs assistance Sitting-balance support: Feet supported Sitting balance-Leahy Scale: Good     Standing balance support: During functional activity;Bilateral upper extremity supported Standing balance-Leahy Scale: Poor                              Cognition Arousal/Alertness: Awake/alert Behavior During Therapy: WFL for tasks assessed/performed;Impulsive Overall Cognitive Status: Within Functional Limits for tasks assessed                                        Exercises Total Joint Exercises Ankle Circles/Pumps: AROM;Both;20 reps;Supine Quad Sets: AROM;Both;Supine;15 reps Heel Slides: AAROM;Right;Supine;15 reps Straight Leg Raises: AAROM;Right;Supine;20 reps    General Comments        Pertinent Vitals/Pain Pain Assessment: 0-10 Pain Score: 7  Pain Location: Rt knee Pain Descriptors / Indicators: Aching;Discomfort;Grimacing  Pain Intervention(s): Limited activity within patient's tolerance;Monitored during session;Premedicated before session;Ice applied    Home Living                      Prior Function            PT Goals (current goals can now be found in the care plan section) Acute Rehab PT Goals Patient Stated Goal: be able to  walk more without pain PT Goal Formulation: With patient Time For Goal Achievement: 07/04/21 Potential to Achieve Goals: Good Progress towards PT goals: Progressing toward goals    Frequency    7X/week      PT Plan Current plan remains appropriate    Co-evaluation              AM-PAC PT "6 Clicks" Mobility   Outcome Measure  Help needed turning from your back to your side while in a flat bed without using bedrails?: A Little Help needed moving from lying on your back to sitting on the side of a flat bed without using bedrails?: A Little Help needed moving to and from a bed to a chair (including a wheelchair)?: A Lot Help needed standing up from a chair using your arms (e.g., wheelchair or bedside chair)?: A Little Help needed to walk in hospital room?: A Little Help needed climbing 3-5 steps with a railing? : A Lot 6 Click Score: 16    End of Session Equipment Utilized During Treatment: Gait belt;Right knee immobilizer Activity Tolerance: Patient limited by fatigue;Patient limited by pain;Patient tolerated treatment well Patient left: in bed;with call bell/phone within reach;with nursing/sitter in room;with bed alarm set Nurse Communication: Mobility status PT Visit Diagnosis: Other abnormalities of gait and mobility (R26.89);Muscle weakness (generalized) (M62.81);Difficulty in walking, not elsewhere classified (R26.2);Pain Pain - Right/Left: Right Pain - part of body: Knee     Time: 5188-4166 PT Time Calculation (min) (ACUTE ONLY): 46 min  Charges:  $Gait Training: 23-37 mins $Therapeutic Exercise: 8-22 mins                     Mauro Kaufmann PT Acute Rehabilitation Services Pager [phone] Office [phone]    Sherri Hart 07/01/2021, 5:17 PM

## 2021-07-02 DIAGNOSIS — I4891 Unspecified atrial fibrillation: Secondary | ICD-10-CM | POA: Diagnosis not present

## 2021-07-02 DIAGNOSIS — T84018A Broken internal joint prosthesis, other site, initial encounter: Secondary | ICD-10-CM | POA: Diagnosis not present

## 2021-07-02 DIAGNOSIS — J9621 Acute and chronic respiratory failure with hypoxia: Secondary | ICD-10-CM | POA: Diagnosis not present

## 2021-07-02 DIAGNOSIS — L89151 Pressure ulcer of sacral region, stage 1: Secondary | ICD-10-CM

## 2021-07-02 DIAGNOSIS — Z96651 Presence of right artificial knee joint: Secondary | ICD-10-CM | POA: Diagnosis not present

## 2021-07-02 LAB — CBC
HCT: 38.1 % (ref 36.0–46.0)
Hemoglobin: 12.2 g/dL (ref 12.0–15.0)
MCH: 32.9 pg (ref 26.0–34.0)
MCHC: 32 g/dL (ref 30.0–36.0)
MCV: 102.7 fL — ABNORMAL HIGH (ref 80.0–100.0)
Platelets: 191 K/uL (ref 150–400)
RBC: 3.71 MIL/uL — ABNORMAL LOW (ref 3.87–5.11)
RDW: 13.8 % (ref 11.5–15.5)
WBC: 17.9 K/uL — ABNORMAL HIGH (ref 4.0–10.5)
nRBC: 0 % (ref 0.0–0.2)

## 2021-07-02 LAB — BASIC METABOLIC PANEL
Anion gap: 5 (ref 5–15)
BUN: 44 mg/dL — ABNORMAL HIGH (ref 8–23)
CO2: 35 mmol/L — ABNORMAL HIGH (ref 22–32)
Calcium: 8.5 mg/dL — ABNORMAL LOW (ref 8.9–10.3)
Chloride: 97 mmol/L — ABNORMAL LOW (ref 98–111)
Creatinine, Ser: 1.12 mg/dL — ABNORMAL HIGH (ref 0.44–1.00)
GFR, Estimated: 52 mL/min — ABNORMAL LOW (ref >60)
Glucose, Bld: 125 mg/dL — ABNORMAL HIGH (ref 70–99)
Potassium: 3.7 mmol/L (ref 3.5–5.1)
Sodium: 137 mmol/L (ref 135–145)

## 2021-07-02 LAB — MAGNESIUM: Magnesium: 2 mg/dL (ref 1.7–2.4)

## 2021-07-02 MED ORDER — LEVALBUTEROL HCL 0.63 MG/3ML IN NEBU
0.6300 mg | INHALATION_SOLUTION | Freq: Four times a day (QID) | RESPIRATORY_TRACT | Status: DC | PRN
  Filled 2021-07-02: qty 3

## 2021-07-02 NOTE — TOC Progression Note (Signed)
Transition of Care [facility]) - Progression Note   Patient Details  Name: Sherri Hart MRN: [id number] Date of Birth: Aug 01, 1946  [facility] CM/SW Contact  Ewing Schlein, LCSW Phone Number: 07/02/2021, 11:20 AM  Clinical Narrative: CSW confirmed that patient will still discharge home with OPPT now that patient is ambulating over 50 feet. Patient has needed DME.  Expected Discharge Plan: Home w Home Health Services Barriers to Discharge: Continued Medical Work up  Expected Discharge Plan and Services Expected Discharge Plan: Home w Home Health Services In-house Referral: Clinical Social Work Living arrangements for the past 2 months: Single Family Home Expected Discharge Date: 06/29/21               DME Arranged: 3-N-1 DME Agency: [employer] Date DME Agency Contacted: 06/30/21 Time DME Agency Contacted: [phone] Representative spoke with at DME Agency: Velna Hatchet  Readmission Risk Interventions No flowsheet data found.

## 2021-07-02 NOTE — Progress Notes (Signed)
Physical Therapy Treatment Patient Details Name: Sherri Hart MRN: [id number] DOB: 05-May-1946 Today's Date: 07/02/2021   History of Present Illness Patient is 75 y.o. female s/p Rt TKR on 06/27/21 with PMH significant for OA, anxiety, A-fib, COPD, CHF, back surgery.  06/30/21 Rapid response called with pt in afib with RVR and pt transferred to step-down unit.    PT Comments    Patient presents with significant change in MS and  level of assistance for mobility compared to 10/23. Patient is not able to focus on activity, requiring constant cues/stimulation. Patient required +2 mod/max assistance today. HR 115, SPO2 on 8 L HFNC 91%C  With noted periods of shallow slow RR  down to 8.  Continue PT Patient now will definitely require post acute rehab due to medical complications.  Recommendations for follow up therapy are one component of a multi-disciplinary discharge planning process, led by the attending physician.  Recommendations may be updated based on patient status, additional functional criteria and insurance authorization.  Follow Up Recommendations  Skilled nursing-short term rehab (<3 hours/day)     Assistance Recommended at Discharge    Equipment Recommendations  Rolling walker (2 wheels)    Recommendations for Other Services       Precautions / Restrictions Precautions Precautions: Fall;Knee Required Braces or Orthoses: Knee Immobilizer - Right Knee Immobilizer - Right: Discontinue once straight leg raise with < 10 degree lag Restrictions RLE Weight Bearing: Weight bearing as tolerated     Mobility  Bed Mobility Overal bed mobility: Needs Assistance       Supine to sit: Mod assist     General bed mobility comments: multimodal cues to  for task, started moving legs to bed edge, required constan tactile verbal cues. Max assist to return to supine, patient poorly engaged in  efforts.    Transfers Overall transfer level: Needs assistance   Transfers: Sit  to/from Stand Sit to Stand: +2 safety/equipment;+2 physical assistance;Mod assist           General transfer comment: multimodal cues  and mod +2 to power up to stand at RW, Ques to place hands on RW. side stepped with much effort and cues. patient is not alert for following even though eyes open.    Ambulation/Gait                 Stairs             Wheelchair Mobility    Modified Rankin (Stroke Patients Only)       Balance   Sitting-balance support: Feet supported;Bilateral upper extremity supported Sitting balance-Leahy Scale: Poor     Standing balance support: During functional activity;Bilateral upper extremity supported Standing balance-Leahy Scale: Poor Standing balance comment: patient  lethargic and not focussed.                            Cognition Arousal/Alertness: Awake/alert;Lethargic Behavior During Therapy: Flat affect Overall Cognitive Status: Impaired/Different from baseline Area of Impairment: Orientation;Attention;Following commands;Awareness                 Orientation Level: Time Current Attention Level: Focused   Following Commands: Follows one step commands inconsistently   Awareness: Intellectual   General Comments: patient noted to  stare off, not focussing, noted RR decr to 8. when stimulated, patient responds" What". Patient required constant cues  to perform mobility, stand and  sit down.        Exercises  General Comments        Pertinent Vitals/Pain Faces Pain Scale: Hurts even more Pain Location: Rt knee Pain Descriptors / Indicators: Aching;Discomfort;Grimacing Pain Intervention(s): Monitored during session;Premedicated before session    Home Living                          Prior Function            PT Goals (current goals can now be found in the care plan section) Acute Rehab PT Goals Patient Stated Goal: not able PT Goal Formulation: Patient unable to participate in  goal setting Time For Goal Achievement: 07/16/21 Potential to Achieve Goals: Fair Progress towards PT goals: Not progressing toward goals - comment (has had medical complications)    Frequency    Min 4X/week      PT Plan Discharge plan needs to be updated;Frequency needs to be updated    Co-evaluation              AM-PAC PT "6 Clicks" Mobility   Outcome Measure  Help needed turning from your back to your side while in a flat bed without using bedrails?: Total Help needed moving from lying on your back to sitting on the side of a flat bed without using bedrails?: Total Help needed moving to and from a bed to a chair (including a wheelchair)?: Total Help needed standing up from a chair using your arms (e.g., wheelchair or bedside chair)?: Total Help needed to walk in hospital room?: Total Help needed climbing 3-5 steps with a railing? : Total 6 Click Score: 6    End of Session Equipment Utilized During Treatment: Gait belt;Right knee immobilizer Activity Tolerance: Patient limited by fatigue;Treatment limited secondary to medical complications (Comment) Patient left: in bed;with call bell/phone within reach;with bed alarm set Nurse Communication: Mobility status PT Visit Diagnosis: Other abnormalities of gait and mobility (R26.89);Muscle weakness (generalized) (M62.81);Difficulty in walking, not elsewhere classified (R26.2);Pain Pain - Right/Left: Right Pain - part of body: Knee     Time: 1696-7893 PT Time Calculation (min) (ACUTE ONLY): 19 min  Charges:  $Therapeutic Activity: 8-22 mins                    Blanchard Kelch PT Acute Rehabilitation Services Pager [phone] Office [phone]   Rada Hay 07/02/2021, 5:02 PM

## 2021-07-02 NOTE — Progress Notes (Signed)
   Subjective: 5 Days Post-Op Procedure(s) (LRB): TOTAL KNEE REVISION (Right) Patient reports pain as mild.   Patient seen in rounds for Dr. Lequita Halt. Patient is well, and has had no acute complaints or problems. States she is ready to go home, but discussed with patient that she must be medically cleared prior. Denies chest pain or SOB. Reports knee is doing well, ambulated 21' yesterday with PT. Plan is to go Home after hospital stay.  Objective: Vital signs in last 24 hours: Temp:  [97.6 F (36.4 C)-99.4 F (37.4 C)] 98.4 F (36.9 C) (10/24 0813) Pulse Rate:  [92-127] 110 (10/24 0500) Resp:  [12-28] 16 (10/24 0500) BP: (105-151)/(53-122) 151/122 (10/24 0400) SpO2:  [89 %-97 %] 92 % (10/24 0833)  Intake/Output from previous day:  Intake/Output Summary (Last 24 hours) at 07/02/2021 1037 Last data filed at 07/02/2021 0000 Gross per 24 hour  Intake --  Output 2900 ml  Net -2900 ml    Intake/Output this shift: No intake/output data recorded.  Labs: Recent Labs    06/30/21 0326 07/01/21 0249 07/02/21 0248  HGB 12.3 12.5 12.2   Recent Labs    07/01/21 0249 07/02/21 0248  WBC 13.0* 17.9*  RBC 3.81* 3.71*  HCT 39.1 38.1  PLT 177 191   Recent Labs    07/01/21 0249 07/02/21 0248  NA 135 137  K 4.2 3.7  CL 93* 97*  CO2 33* 35*  BUN 39* 44*  CREATININE 1.23* 1.12*  GLUCOSE 184* 125*  CALCIUM 8.5* 8.5*   No results for input(s): LABPT, INR in the last 72 hours.  Exam: General - Patient is Alert and Oriented Extremity - Neurologically intact Neurovascular intact Sensation intact distally Dorsiflexion/Plantar flexion intact Dressing/Incision - Mild bloody drainage coming from lateral border on Aquacel on exam. Removed dressing, cleaned area with sterile gauze and reapplied a fresh Aquacel. Incision looks great. Motor Function - intact, moving foot and toes well on exam.   Past Medical History:  Diagnosis Date   Anxiety    Arthritis    Atrial fibrillation  (HCC)    Complication of anesthesia    used to get migraine headaches after surgery   Congestive heart failure (HCC)    COPD (chronic obstructive pulmonary disease) (HCC)    Dyspnea    if afib causes a problem   Pneumonia     Assessment/Plan: 5 Days Post-Op Procedure(s) (LRB): TOTAL KNEE REVISION (Right) Principal Problem:   Failed total knee arthroplasty (HCC) Active Problems:   Atrial fibrillation with rapid ventricular response (HCC)   COPD with acute exacerbation (HCC)   Status post revision of total knee replacement, right   Pressure injury of skin  Estimated body mass index is 39.77 kg/m as calculated from the following:   Height as of this encounter: 5\' 5"  (1.651 m).   Weight as of this encounter: 108.4 kg. Up with therapy  DVT Prophylaxis - Xarelto Weight-bearing as tolerated  Discharge to home with OPPT once medically cleared. Very appreciative of the hospitalist team's assistance with Ms. Block's care.  , PA-C Orthopedic Surgery [phone] 07/02/2021, 10:37 AM

## 2021-07-02 NOTE — Progress Notes (Signed)
PROGRESS NOTE  Sherri Hart OJJ:[id number] DOB: [birth year] DOA: 06/27/2021 PCP: Ellyn Hack, MD   LOS: 5 days   Brief narrative:  Patient is a 75 years old female with past medical history of right total knee arthroplasty which had failed.  Patient was admitted hospital for revision arthroplasty by orthopedics team.  Patient does have baseline shortness of breath and is on 4 L of oxygen at home though she is not quite compliant with it.  Recently patient has been having increasing oxygen demand especially today and has been complaining of severe pain in the right knee.  Has been having some cough but no fever chills or rigor.  No concern of nausea vomiting.  States that she has no trouble swallowing.  No concern for aspiration.    Patient was also noted to have atrial fibrillation with RVR so hospitalist team was consulted for further evaluation and treatment.  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 Initially required nonrebreather mask.  Currently on nasal cannula.  We will continue to wean down oxygen to her home levels at 4 L/min if possible.  No active wheezing or crackles.  Continue incentive spirometry pain control.  Patient feels better.    Failed total knee arthroplasty.  Status post revision surgery.  Management as per primary team.  Continue knee immobilizer.   Permanent atrial fibrillation with RVR.   Improved after pain management.  Mild tachycardia likely secondary to albuterol.  Continue Cardizem and Xarelto.  Changed to Xopenex.   Acute COPD exacerbation.  Patient is on home oxygen at 4 L/min but was not really compliant at home.   Continue bronchodilators.  Change to Xopenex due to tachycardia.  Improved   History of diastolic congestive heart failure.  No crackles.  No significant edema.  Symptoms dose of Lasix during hospitalization, continue oral Lasix from home.  Pressure ulceration stage I present on admission.  Preventive protocol. Pressure Injury 07/01/21 Pretibial Right;Lateral Stage 1 -  Intact skin with non-blanchable redness of a localized area usually over a bony prominence. Thin red nonblanchable line from ice pack (Active)  07/01/21 0800  Location: Pretibial  Location Orientation: Right;Lateral  Staging: Stage 1 -  Intact skin with non-blanchable redness of a localized area usually over a bony prominence.  Wound Description (Comments): Thin red nonblanchable line from ice pack  Present on Admission: No     DVT prophylaxis: SCDs Start: 06/27/21 1456 Place TED hose Start: 06/27/21 1456 rivaroxaban (XARELTO) tablet 20 mg   Code Status: Full code  Family Communication: Granddaughter at bedside  Status is: Inpatient  Remains inpatient appropriate because: Status post knee replacement,  Procedures: Right knee revision surgery  Anti-infectives:  None  Subjective: Today, patient was seen and examined at bedside.  Feels okay with breathing.  Complains of mild sputum production.  Pain under control.  Objective: Vitals:   07/02/21 0813 07/02/21 0833  BP:    Pulse:    Resp:    Temp: 98.4 F (36.9 C)   SpO2:  92%    Intake/Output Summary (Last 24 hours) at 07/02/2021 0930 Last data filed at 07/02/2021 0000 Gross per 24 hour  Intake --  Output 2900  ml  Net -2900 ml    Filed Weights   06/27/21 0944  Weight: 108.4 kg   Body mass index is 39.77 kg/m.   Physical Exam:  General: Obese built, not in obvious distress, communicative, on nasal cannula oxygen HENT:   No scleral pallor or icterus noted. Oral mucosa is moist.  Chest:   Diminished breath sounds bilaterally. No crackles or  wheezes.  CVS: S1 &S2 heard. No murmur.  Irregular. Abdomen: Soft, nontender, nondistended.  Bowel sounds are heard.   Extremities: No cyanosis, clubbing or edema.  Right knee status post surgery. Psych: Alert, awake and oriented, normal mood CNS:  No cranial nerve deficits.  Power equal in all extremities.   Skin: Warm and dry.  Right knee surgery.   Data Review: I have personally reviewed the following laboratory data and studies,  CBC: Recent Labs  Lab 06/28/21 0320 06/29/21 0321 06/30/21 0326 07/01/21 0249 07/02/21 0248  WBC 14.0* 19.3* 16.6* 13.0* 17.9*  HGB 13.4 12.7 12.3 12.5 12.2  HCT 42.3 38.1 39.0 39.1 38.1  MCV 103.2* 100.8* 105.1* 102.6* 102.7*  PLT 186 170 169 177 191    Basic Metabolic Panel: Recent Labs  Lab 06/28/21 0320 06/29/21 0359 06/30/21 1811 07/01/21 0249 07/02/21 0248  NA 136 135 136 135 137  K 4.5 4.4 4.8 4.2 3.7  CL 102 99 95* 93* 97*  CO2 25 28 31  33* 35*  GLUCOSE 181* 162* 87 184* 125*  BUN 18 34* 37* 39* 44*  CREATININE 1.03* 1.26* 1.28* 1.23* 1.12*  CALCIUM 8.7* 9.0 8.9 8.5* 8.5*  MG  --   --   --  1.9 2.0  PHOS  --   --   --  4.2  --     Liver Function Tests: No results for input(s): AST, ALT, ALKPHOS, BILITOT, PROT, ALBUMIN in the last 168 hours. No results for input(s): LIPASE, AMYLASE in the last 168 hours. No results for input(s): AMMONIA in the last 168 hours. Cardiac Enzymes: No results for input(s): CKTOTAL, CKMB, CKMBINDEX, TROPONINI in the last 168 hours. BNP (last 3 results) Recent Labs    07/04/20 1904 02/01/21 1255  BNP 503.9* 258.1*     ProBNP (last 3 results) No results for input(s): PROBNP in the last 8760 hours.  CBG: No results for input(s): GLUCAP in the last 168 hours. Recent Results (from the past 240 hour(s))  SARS Coronavirus 2 by RT PCR (hospital order, performed in [facility] hospital lab) Nasopharyngeal Nasopharyngeal Swab     Status: None   Collection Time: 06/27/21  9:26 AM   Specimen:  Nasopharyngeal Swab  Result Value Ref Range Status   SARS Coronavirus 2 NEGATIVE NEGATIVE Final    Comment: (NOTE) SARS-CoV-2 target nucleic acids are NOT DETECTED.  The SARS-CoV-2 RNA is generally detectable in upper and lower respiratory specimens during the acute phase of infection. The lowest concentration of SARS-CoV-2 viral copies this assay can detect is 250 copies / mL. A negative result does not preclude SARS-CoV-2 infection and should not be used as the sole basis for treatment or other patient management decisions.  A negative result may occur with improper specimen collection / handling, submission of specimen other than nasopharyngeal swab, presence of viral mutation(s) within the areas targeted by this assay, and inadequate number of viral copies (<250 copies / mL). A negative result must be combined with clinical observations, patient history, and epidemiological information.  Fact Sheet for Patients:   06/29/21  Fact Sheet for Healthcare  Providers: https://pope.com/  This test is not yet approved or  cleared by the [place] and has been authorized for detection and/or diagnosis of SARS-CoV-2 by FDA under an Emergency Use Authorization (EUA).  This EUA will remain in effect (meaning this test can be used) for the duration of the COVID-19 declaration under Section 564(b)(1) of the Act, 21 U.S.C. section 360bbb-3(b)(1), unless the authorization is terminated or revoked sooner.  Performed at [facility], [address] [place], [place] [address]       Studies: DG Chest Port 1 View  Result Date: 06/30/2021 CLINICAL DATA:  Acute respiratory distress. Knee surgery 3 days ago. EXAM: PORTABLE CHEST 1 VIEW COMPARISON:  Chest radiograph 06/22/2021.  CT 10/04/2020 FINDINGS: Upper normal heart size, stable allowing for differences in technique. Unchanged mediastinal contours. Aortic  atherosclerosis. Bandlike streaky opacity at both lung bases typical of atelectasis. Mild emphysema and peribronchial thickening. There is no significant pleural effusion. No visualized pneumothorax. Surgical hardware in the lower cervical spine. IMPRESSION: 1. Bandlike streaky opacity at both lung bases typical of atelectasis. 2. Mild emphysema and chronic peribronchial thickening. Electronically Signed   By: Narda Rutherford M.D.   On: 06/30/2021 17:34      Joycelyn Das, MD  [facility] 07/02/2021  If 7PM-7AM, please contact night-coverage

## 2021-07-03 DIAGNOSIS — Z96651 Presence of right artificial knee joint: Secondary | ICD-10-CM | POA: Diagnosis not present

## 2021-07-03 DIAGNOSIS — I4891 Unspecified atrial fibrillation: Secondary | ICD-10-CM | POA: Diagnosis not present

## 2021-07-03 DIAGNOSIS — J9621 Acute and chronic respiratory failure with hypoxia: Secondary | ICD-10-CM | POA: Diagnosis not present

## 2021-07-03 DIAGNOSIS — T84018A Broken internal joint prosthesis, other site, initial encounter: Secondary | ICD-10-CM | POA: Diagnosis not present

## 2021-07-03 LAB — BASIC METABOLIC PANEL
Anion gap: 8 (ref 5–15)
BUN: 36 mg/dL — ABNORMAL HIGH (ref 8–23)
CO2: 34 mmol/L — ABNORMAL HIGH (ref 22–32)
Calcium: 8.5 mg/dL — ABNORMAL LOW (ref 8.9–10.3)
Chloride: 93 mmol/L — ABNORMAL LOW (ref 98–111)
Creatinine, Ser: 0.93 mg/dL (ref 0.44–1.00)
GFR, Estimated: >60 mL/min (ref >60)
Glucose, Bld: 137 mg/dL — ABNORMAL HIGH (ref 70–99)
Potassium: 3.6 mmol/L (ref 3.5–5.1)
Sodium: 135 mmol/L (ref 135–145)

## 2021-07-03 LAB — CBC
HCT: 39.9 % (ref 36.0–46.0)
Hemoglobin: 12.7 g/dL (ref 12.0–15.0)
MCH: 32.8 pg (ref 26.0–34.0)
MCHC: 31.8 g/dL (ref 30.0–36.0)
MCV: 103.1 fL — ABNORMAL HIGH (ref 80.0–100.0)
Platelets: 202 10*3/uL (ref 150–400)
RBC: 3.87 MIL/uL (ref 3.87–5.11)
RDW: 14 % (ref 11.5–15.5)
WBC: 14.6 10*3/uL — ABNORMAL HIGH (ref 4.0–10.5)
nRBC: 0 % (ref 0.0–0.2)

## 2021-07-03 MED ORDER — GUAIFENESIN 100 MG/5ML PO LIQD
5.0000 mL | ORAL | Status: DC | PRN
Start: 2021-07-03 — End: 2021-07-03

## 2021-07-03 MED ORDER — GUAIFENESIN 100 MG/5ML PO LIQD
5.0000 mL | ORAL | Status: DC | PRN
  Administered 2021-07-03: 5 mL via ORAL
  Filled 2021-07-03: qty 10

## 2021-07-03 NOTE — Progress Notes (Signed)
Physical Therapy Treatment Patient Details Name: Sherri Hart MRN: [id number] DOB: 1946/05/25 Today's Date: 07/03/2021   History of Present Illness Patient is 75 y.o. female s/p Rt TKR on 06/27/21 with PMH significant for OA, anxiety, A-fib, COPD, CHF, back surgery.  06/30/21 Rapid response called with pt in afib with RVR and pt transferred to step-down unit.    PT Comments    The patient remains lethargic and unable to effectively participate in mobility. Requires constant cues and max assist of 2 to stand an pivot to bed. .    Recommendations for follow up therapy are one component of a multi-disciplinary discharge planning process, led by the attending physician.  Recommendations may be updated based on patient status, additional functional criteria and insurance authorization.  Follow Up Recommendations  Skilled nursing-short term rehab (<3 hours/day)     Assistance Recommended at Discharge Frequent or constant Supervision/Assistance  Equipment Recommendations  None recommended by PT    Recommendations for Other Services       Precautions / Restrictions Precautions Precautions: Fall;Knee Required Braces or Orthoses: Knee Immobilizer - Right Knee Immobilizer - Right: Discontinue once straight leg raise with < 10 degree lag Restrictions RLE Weight Bearing: Weight bearing as tolerated Other Position/Activity Restrictions: WBAT     Mobility  Bed Mobility Overal bed mobility: Needs Assistance Bed Mobility: sit to supine  Sit to supine: Max assist;+2 for safety/equipment;+2 for physical assistance   General bed mobility comments: patient not able to assist with return to supine.    Transfers Overall transfer level: Needs assistance ) Transfers: Sit to/from Stand;Stand Pivot Transfers Sit to Stand: +2 safety/equipment;+2 physical assistance;Max assist Stand pivot transfers: Max assist;+2 safety/equipment;+2 physical assistance         General transfer comment:  max assist to stand from recliner and pivot back to bed. patient  antalgic on R and unable to stand    Ambulation/Gait                 Stairs             Wheelchair Mobility    Modified Rankin (Stroke Patients Only)       Balance Overall balance assessment: Needs assistance Sitting-balance support: Feet supported;Bilateral upper extremity supported Sitting balance-Leahy Scale: Poor Sitting balance - Comments: requires cues stay aroused and sit up   Standing balance support: During functional activity;Bilateral upper extremity supported Standing balance-Leahy Scale: Poor Standing balance comment: patient  lethargic and not focussed.                            Cognition Arousal/Alertness: Lethargic Behavior During Therapy: Flat affect Overall Cognitive Status: Impaired/Different from baseline Area of Impairment: Orientation;Attention;Following commands;Awareness                 Orientation Level: Time;Situation     Following Commands: Follows one step commands inconsistently   Awareness: Intellectual   General Comments: patient noted to  stare off, not focusing, requires constant stimulation to arouse and follow directions for mobility.        Exercises Total Joint Exercises Ankle Circles/Pumps: AROM;Both;20 reps;Supine Heel Slides: AAROM;Right;Supine;15 reps Hip ABduction/ADduction: AAROM;Right;10 reps;Supine Straight Leg Raises: AAROM;Right;Supine;10 reps    General Comments        Pertinent Vitals/Pain Faces Pain Scale: Hurts even more Pain Location: Rt knee Pain Descriptors / Indicators: Aching;Discomfort;Grimacing;Moaning Pain Intervention(s): Monitored during session    Home Living  Prior Function            PT Goals (current goals can now be found in the care plan section) Progress towards PT goals: Progressing toward goals    Frequency    Min 4X/week      PT Plan  Current plan remains appropriate    Co-evaluation              AM-PAC PT "6 Clicks" Mobility   Outcome Measure  Help needed turning from your back to your side while in a flat bed without using bedrails?: A Lot Help needed moving from lying on your back to sitting on the side of a flat bed without using bedrails?: Total Help needed moving to and from a bed to a chair (including a wheelchair)?: Total Help needed standing up from a chair using your arms (e.g., wheelchair or bedside chair)?: Total Help needed to walk in hospital room?: Total Help needed climbing 3-5 steps with a railing? : Total 6 Click Score: 7    End of Session Equipment Utilized During Treatment: Gait belt;Right knee immobilizer Activity Tolerance: Patient limited by fatigue;Treatment limited secondary to medical complications (Comment) Patient left: in bed;with call bell/phone within reach;with bed alarm set Nurse Communication: Mobility status PT Visit Diagnosis: Other abnormalities of gait and mobility (R26.89);Muscle weakness (generalized) (M62.81);Difficulty in walking, not elsewhere classified (R26.2);Pain Pain - Right/Left: Right Pain - part of body: Knee     Time: 1530-1551 PT Time Calculation (min) (ACUTE ONLY): 21 min  Charges:  $Therapeutic Activity: 8-22 mins                     Blanchard Kelch PT Acute Rehabilitation Services Pager [phone] Office [phone]    Rada Hay 07/03/2021, 4:49 PM

## 2021-07-03 NOTE — Progress Notes (Signed)
   Subjective: 6 Days Post-Op Procedure(s) (LRB): TOTAL KNEE REVISION (Right) Patient reports pain as mild.   Patient seen in rounds for Dr. Lequita Halt. Patient is doing fair. Had setbacks with physical therapy yesterday in regards to mobility.   Objective: Vital signs in last 24 hours: Temp:  [97.9 F (36.6 C)-98.8 F (37.1 C)] 98.8 F (37.1 C) (10/25 0806) Pulse Rate:  [95-129] 102 (10/25 0700) Resp:  [15-27] 17 (10/25 0700) BP: (101-145)/(47-116) 118/79 (10/25 0700) SpO2:  [79 %-97 %] 92 % (10/25 0806)  Intake/Output from previous day:  Intake/Output Summary (Last 24 hours) at 07/03/2021 1114 Last data filed at 07/03/2021 0700 Gross per 24 hour  Intake 480 ml  Output 2100 ml  Net -1620 ml    Intake/Output this shift: No intake/output data recorded.  Labs: Recent Labs    07/01/21 0249 07/02/21 0248 07/03/21 0238  HGB 12.5 12.2 12.7   Recent Labs    07/02/21 0248 07/03/21 0238  WBC 17.9* 14.6*  RBC 3.71* 3.87  HCT 38.1 39.9  PLT 191 202   Recent Labs    07/02/21 0248 07/03/21 0238  NA 137 135  K 3.7 3.6  CL 97* 93*  CO2 35* 34*  BUN 44* 36*  CREATININE 1.12* 0.93  GLUCOSE 125* 137*  CALCIUM 8.5* 8.5*   No results for input(s): LABPT, INR in the last 72 hours.  Exam: General - Patient is Alert and Oriented Extremity - Neurologically intact Neurovascular intact Sensation intact distally Dorsiflexion/Plantar flexion intact Dressing/Incision - clean, dry, no drainage Motor Function - intact, moving foot and toes well on exam.   Past Medical History:  Diagnosis Date   Anxiety    Arthritis    Atrial fibrillation (HCC)    Complication of anesthesia    used to get migraine headaches after surgery   Congestive heart failure (HCC)    COPD (chronic obstructive pulmonary disease) (HCC)    Dyspnea    if afib causes a problem   Pneumonia     Assessment/Plan: 6 Days Post-Op Procedure(s) (LRB): TOTAL KNEE REVISION (Right) Principal Problem:    Failed total knee arthroplasty (HCC) Active Problems:   Atrial fibrillation with rapid ventricular response (HCC)   COPD with acute exacerbation (HCC)   Status post revision of total knee replacement, right   Pressure injury of skin  Estimated body mass index is 39.77 kg/m as calculated from the following:   Height as of this encounter: 5\' 5"  (1.651 m).   Weight as of this encounter: 108.4 kg. Up with therapy  DVT Prophylaxis - Xarelto Weight-bearing as tolerated  Has been medically cleared for discharge. Based on her performance with physical therapy yesterday, she may need to discharge to SNF. Was doing very well the day prior and ambulated 52'. Would like to see how she does with two sessions of physical therapy today prior to making a final disposition decision.   , PA-C Orthopedic Surgery [phone] 07/03/2021, 11:14 AM

## 2021-07-03 NOTE — Progress Notes (Signed)
Physical Therapy Treatment Patient Details Name: Sherri Hart MRN: [id number] DOB: 12-Dec-1945 Today's Date: 07/03/2021   History of Present Illness Patient is 75 y.o. female s/p Rt TKR on 06/27/21 with PMH significant for OA, anxiety, A-fib, COPD, CHF, back surgery.  06/30/21 Rapid response called with pt in afib with RVR and pt transferred to step-down unit.    PT Comments    Patient continues to be lethargic and has much difficulty with participation. Required 2 max assistance to stand and pivot to recliner. Patient could not utilize Rw so  held each UE to stand and pivot.  Patient remains on 6 L HFNC. Spo2 in low 90's.   Recommendations for follow up therapy are one component of a multi-disciplinary discharge planning process, led by the attending physician.  Recommendations may be updated based on patient status, additional functional criteria and insurance authorization.  Follow Up Recommendations  Skilled nursing-short term rehab (<3 hours/day)     Assistance Recommended at Discharge Frequent or constant Supervision/Assistance  Equipment Recommendations  None recommended by PT    Recommendations for Other Services       Precautions / Restrictions Precautions Precautions: Fall;Knee Required Braces or Orthoses: Knee Immobilizer - Right Knee Immobilizer - Right: Discontinue once straight leg raise with < 10 degree lag Restrictions Other Position/Activity Restrictions: WBAT     Mobility  Bed Mobility Overal bed mobility: Needs Assistance Bed Mobility: Supine to Sit     Supine to sit: Mod assist;+2 for safety/equipment;+2 for physical assistance     General bed mobility comments: multimodal cues to  for task, started moving legs to bed edge, required constant tactile verbal cues. .    Transfers Overall transfer level: Needs assistance Equipment used: Rolling walker (2 wheels) Transfers: Sit to/from [employer] Transfers Sit to Stand: +2  safety/equipment;+2 physical assistance;Max assist Stand pivot transfers: Max assist;+2 safety/equipment;+2 physical assistance         General transfer comment: multimodal cues  and max +2 to power up to stand at RW,  Unable to get patient to take a step, sat down . Stood and pivoted with 2 person arm hold, much cues and effort to turn patient to recliner, decreased control of descent.    Ambulation/Gait                 Stairs             Wheelchair Mobility    Modified Rankin (Stroke Patients Only)       Balance Overall balance assessment: Needs assistance Sitting-balance support: Feet supported;Bilateral upper extremity supported Sitting balance-Leahy Scale: Poor Sitting balance - Comments: requires cues stay aroused and sit up   Standing balance support: During functional activity;Bilateral upper extremity supported Standing balance-Leahy Scale: Poor Standing balance comment: patient  lethargic and not focussed.                            Cognition Arousal/Alertness: Lethargic Behavior During Therapy: Flat affect Overall Cognitive Status: Impaired/Different from baseline Area of Impairment: Orientation;Attention;Following commands;Awareness                 Orientation Level: Time;Situation     Following Commands: Follows one step commands inconsistently   Awareness: Intellectual   General Comments: patient noted to  stare off, not focusing, requires constant stimulation to arouse and follow directions for mobility.        Exercises      General Comments  Pertinent Vitals/Pain Pain Location: Rt knee Pain Descriptors / Indicators: Aching;Discomfort;Grimacing;Moaning Pain Intervention(s): Monitored during session;Premedicated before session    Home Living                          Prior Function            PT Goals (current goals can now be found in the care plan section) Progress towards PT goals: Not  progressing toward goals - comment (ltoo lethargic to participate.)    Frequency    Min 4X/week      PT Plan Current plan remains appropriate    Co-evaluation              AM-PAC PT "6 Clicks" Mobility   Outcome Measure  Help needed turning from your back to your side while in a flat bed without using bedrails?: A Lot Help needed moving from lying on your back to sitting on the side of a flat bed without using bedrails?: Total Help needed moving to and from a bed to a chair (including a wheelchair)?: Total Help needed standing up from a chair using your arms (e.g., wheelchair or bedside chair)?: Total Help needed to walk in hospital room?: Total Help needed climbing 3-5 steps with a railing? : Total 6 Click Score: 7    End of Session Equipment Utilized During Treatment: Gait belt;Right knee immobilizer Activity Tolerance: Patient limited by fatigue;Treatment limited secondary to medical complications (Comment) Patient left: in chair;with call bell/phone within reach;with chair alarm set Nurse Communication: Mobility status PT Visit Diagnosis: Other abnormalities of gait and mobility (R26.89);Muscle weakness (generalized) (M62.81);Difficulty in walking, not elsewhere classified (R26.2);Pain Pain - Right/Left: Right     Time: 2620-3559 PT Time Calculation (min) (ACUTE ONLY): 24 min  Charges:  $Therapeutic Activity: 23-37 mins                     Blanchard Kelch PT Acute Rehabilitation Services Pager [phone] Office [phone]    Rada Hay 07/03/2021, 4:43 PM

## 2021-07-03 NOTE — Progress Notes (Signed)
PROGRESS NOTE  Sherri Hart FIE:[id number] DOB: [birth year] DOA: 06/27/2021 PCP: Ellyn Hack, MD   LOS: 6 days   Brief narrative:  Patient is a 75 years old female with past medical history of right total knee arthroplasty which had failed was admitted to the hospital for revision arthroplasty by orthopedics team.  Patient does have baseline shortness of breath and is on 4 L of oxygen at home though she is not quite compliant with it.  Medical team was consulted due to increasing oxygen demand postoperatively. Patient was also noted to have atrial fibrillation with RVR so hospitalist team was consulted for further evaluation and treatment.  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 Initially required nonrebreather mask.  Currently on nasal cannula.  Currently on her baseline oxygen demand at 4 to 6 L..  Feel better.  Was encouraged on deep breathing and incentive spirometry.   Failed total knee arthroplasty.  Status post revision surgery.  Management as per primary team.    Permanent atrial fibrillation with RVR.   Improved after pain management.  Mild tachycardia likely secondary to albuterol.  Continue Cardizem and Xarelto.  On Xopenex at this time   Acute COPD exacerbation.  Patient is on home oxygen at 4 L/min but was not really compliant at home.  Continue bronchodilators.  Changed to Xopenex due to tachycardia.  Improved no active wheezing.   Chronic diastolic congestive heart failure.  continue oral Lasix from home.  Patient received 1 dose of IV Lasix during hospitalization.  Pressure ulceration stage I present on admission.  Preventive  protocol. Pressure Injury 07/01/21 Pretibial Right;Lateral Stage 1 -  Intact skin with non-blanchable redness of a localized area usually over a bony prominence. Thin red nonblanchable line from ice pack (Active)  07/01/21 0800  Location: Pretibial  Location Orientation: Right;Lateral  Staging: Stage 1 -  Intact skin with non-blanchable redness of a localized area usually over a bony prominence.  Wound Description (Comments): Thin red nonblanchable line from ice pack  Present on Admission: No    Disposition.  At this time patient seems to be at her baseline.  Medically stable for disposition.    DVT prophylaxis: SCDs Start: 06/27/21 1456 Place TED hose Start: 06/27/21 1456 rivaroxaban (XARELTO) tablet 20 mg   Code Status: Full code  Family Communication:  Spoke with the patient at bedside.  Status is: Inpatient  Remains inpatient appropriate because: Status post knee replacement,  Procedures: Right knee revision surgery  Anti-infectives:  None  Subjective: Today, patient was seen and examined at bedside.  Continues to feel okay with breathing.  Denies chest pain, fever or chills.  Objective: Vitals:   07/03/21 0700 07/03/21 0806  BP: 118/79   Pulse: (!) 102   Resp: 17   Temp:  98.8 F (37.1 C)  SpO2: 94% 92%    Intake/Output Summary (Last 24 hours) at 07/03/2021 1025 Last data filed at 07/03/2021 0700 Gross per 24 hour  Intake 480 ml  Output 2100 ml  Net -1620 ml    Filed Weights   06/27/21 0944  Weight: 108.4 kg   Body mass index is 39.77 kg/m.   Physical Exam: General: Obese built,  not in obvious distress, communicative, on nasal cannula oxygen HENT:   No scleral pallor or icterus noted. Oral mucosa is moist.  Chest:   Diminished breath sounds bilaterally.  CVS: S1 &S2 heard. No murmur.  Irregular. Abdomen: Soft, nontender, nondistended.  Bowel sounds are heard.   Extremities: No cyanosis, clubbing or edema.  Right knee status post surgery. Psych:  Alert, awake and oriented, normal mood CNS:  No cranial nerve deficits.  Power equal in all extremities.   Skin: Warm and dry.  Right knee surgery.   Data Review: I have personally reviewed the following laboratory data and studies,  CBC: Recent Labs  Lab 06/29/21 0321 06/30/21 0326 07/01/21 0249 07/02/21 0248 07/03/21 0238  WBC 19.3* 16.6* 13.0* 17.9* 14.6*  HGB 12.7 12.3 12.5 12.2 12.7  HCT 38.1 39.0 39.1 38.1 39.9  MCV 100.8* 105.1* 102.6* 102.7* 103.1*  PLT 170 169 177 191 202    Basic Metabolic Panel: Recent Labs  Lab 06/29/21 0359 06/30/21 1811 07/01/21 0249 07/02/21 0248 07/03/21 0238  NA 135 136 135 137 135  K 4.4 4.8 4.2 3.7 3.6  CL 99 95* 93* 97* 93*  CO2 28 31 33* 35* 34*  GLUCOSE 162* 87 184* 125* 137*  BUN 34* 37* 39* 44* 36*  CREATININE 1.26* 1.28* 1.23* 1.12* 0.93  CALCIUM 9.0 8.9 8.5* 8.5* 8.5*  MG  --   --  1.9 2.0  --   PHOS  --   --  4.2  --   --     Liver Function Tests: No results for input(s): AST, ALT, ALKPHOS, BILITOT, PROT, ALBUMIN in the last 168 hours. No results for input(s): LIPASE, AMYLASE in the last 168 hours. No results for input(s): AMMONIA in the last 168 hours. Cardiac Enzymes: No results for input(s): CKTOTAL, CKMB, CKMBINDEX, TROPONINI in the last 168 hours. BNP (last 3 results) Recent Labs    07/04/20 1904 02/01/21 1255  BNP 503.9* 258.1*     ProBNP (last 3 results) No results for input(s): PROBNP in the last 8760 hours.  CBG: No results for input(s): GLUCAP in the last 168 hours. Recent Results (from the past 240 hour(s))  SARS Coronavirus 2 by RT PCR (hospital order, performed in [facility] hospital lab) Nasopharyngeal Nasopharyngeal Swab     Status: None   Collection Time: 06/27/21  9:26 AM   Specimen: Nasopharyngeal Swab  Result Value Ref Range Status   SARS Coronavirus 2 NEGATIVE NEGATIVE Final    Comment: (NOTE) SARS-CoV-2 target nucleic acids are NOT DETECTED.  The SARS-CoV-2 RNA is generally  detectable in upper and lower respiratory specimens during the acute phase of infection. The lowest concentration of SARS-CoV-2 viral copies this assay can detect is 250 copies / mL. A negative result does not preclude SARS-CoV-2 infection and should not be used as the sole basis for treatment or other patient management decisions.  A negative result may occur with improper specimen collection / handling, submission of specimen other than nasopharyngeal swab, presence of viral mutation(s) within the areas targeted by this assay, and inadequate number of viral copies (<250 copies / mL). A negative result must be combined with clinical observations, patient history, and epidemiological information.  Fact Sheet for Patients:   BoilerBrush.com.cy  Fact Sheet for Healthcare Providers: https://pope.com/  This test is not yet approved or  cleared by the [place] FDA and has been authorized for detection and/or diagnosis of SARS-CoV-2 by FDA under an Emergency Use Authorization (EUA).  This EUA  will remain in effect (meaning this test can be used) for the duration of the COVID-19 declaration under Section 564(b)(1) of the Act, 21 U.S.C. section 360bbb-3(b)(1), unless the authorization is terminated or revoked sooner.  Performed at [facility], [address], [place], [place] [address]       Studies: No results found.    Joycelyn Das, MD  [facility] 07/03/2021  If 7PM-7AM, please contact night-coverage

## 2021-07-04 DIAGNOSIS — I4891 Unspecified atrial fibrillation: Secondary | ICD-10-CM | POA: Diagnosis not present

## 2021-07-04 DIAGNOSIS — Z96659 Presence of unspecified artificial knee joint: Secondary | ICD-10-CM | POA: Diagnosis not present

## 2021-07-04 DIAGNOSIS — T84018A Broken internal joint prosthesis, other site, initial encounter: Secondary | ICD-10-CM | POA: Diagnosis not present

## 2021-07-04 DIAGNOSIS — R0603 Acute respiratory distress: Secondary | ICD-10-CM | POA: Diagnosis not present

## 2021-07-04 MED ORDER — TRAMADOL HCL 50 MG PO TABS: 50.0000 mg | ORAL_TABLET | Freq: Four times a day (QID) | ORAL | 0 refills | Status: DC | PRN

## 2021-07-04 MED ORDER — FUROSEMIDE 40 MG PO TABS
40.0000 mg | ORAL_TABLET | Freq: Every day | ORAL | Status: DC
  Administered 2021-07-05 – 2021-07-06 (×2): 40 mg via ORAL
  Filled 2021-07-04 (×2): qty 1

## 2021-07-04 MED ORDER — RIVAROXABAN 20 MG PO TABS: 20.0000 mg | ORAL_TABLET | Freq: Every day | ORAL | 0 refills | Status: DC

## 2021-07-04 MED ORDER — METHOCARBAMOL 500 MG PO TABS: 500.0000 mg | ORAL_TABLET | Freq: Four times a day (QID) | ORAL | 0 refills | Status: DC | PRN

## 2021-07-04 MED ORDER — OXYCODONE HCL 5 MG PO TABS: 5.0000 mg | ORAL_TABLET | Freq: Four times a day (QID) | ORAL | 0 refills | Status: DC | PRN

## 2021-07-04 MED ORDER — FUROSEMIDE 10 MG/ML IJ SOLN
40.0000 mg | Freq: Once | INTRAMUSCULAR | Status: AC
  Administered 2021-07-04: 40 mg via INTRAVENOUS
  Filled 2021-07-04: qty 4

## 2021-07-04 NOTE — Progress Notes (Signed)
Pt transferred to [facility]. Report given. Pt's belongings packed. Pt stable at time of transfer

## 2021-07-04 NOTE — Progress Notes (Signed)
PROGRESS NOTE  Sherri Hart YQI:[id number] DOB: 06-25-1946 DOA: 06/27/2021 PCP: Ellyn Hack, MD   LOS: 7 days   Brief Narrative / Interim history: Patient is a 75 years old female with past medical history of right total knee arthroplasty which had failed was admitted to the hospital for revision arthroplasty by orthopedics team.  Patient does have baseline shortness of breath and is on 4 L of oxygen at home though she is not quite compliant with it.  Medical team was consulted due to increasing oxygen demand postoperatively. Patient was also noted to have atrial fibrillation with RVR so hospitalist team was consulted for further evaluation and treatment.  Subjective / 24h Interval events: She is doing well, appreciates her breathing is improved.  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  Currently on nasal cannula.  Currently on her baseline oxygen demand at 4 to 6 L..  Feel better.  Was encouraged on deep breathing and incentive spirometry.  Active Problems Failed total knee arthroplasty-Status post revision surgery.  Management as per primary team.    Permanent atrial fibrillation with RVR-Improved after pain management.  Mild tachycardia likely secondary to albuterol.  Continue Cardizem and Xarelto.  On Xopenex at this time   Acute COPD exacerbation-Patient is on home oxygen at 4 L/min but was not really compliant at home.  Continue bronchodilators.  Changed to Xopenex due to tachycardia.  Improved no active wheezing.   Chronic diastolic congestive heart failure -IV Lasix x1 today, continue oral tomorrow  Scheduled Meds:  albuterol  2.5 mg Nebulization BID   Chlorhexidine Gluconate Cloth  6 each Topical Daily   diltiazem  180 mg Oral Daily   docusate sodium  100 mg Oral  BID   [START ON 07/05/2021] furosemide  40 mg Oral Daily   loratadine  10 mg Oral Daily   mouth rinse  15 mL Mouth Rinse BID   rivaroxaban  20 mg Oral Q breakfast   Continuous Infusions:  methocarbamol (ROBAXIN) IV     PRN Meds:.acetaminophen, albuterol, bisacodyl, diltiazem, diphenhydrAMINE, guaiFENesin, levalbuterol, menthol-cetylpyridinium **OR** phenol, methocarbamol **OR** methocarbamol (ROBAXIN) IV, metoCLOPramide **OR** metoCLOPramide (REGLAN) injection, morphine injection, ondansetron **OR** ondansetron (ZOFRAN) IV, oxyCODONE, polyethylene glycol, sodium chloride, sodium phosphate, traMADol  Diet Orders (From admission, onward)     Start     Ordered   07/04/21 0000  Diet - low sodium heart healthy        07/04/21 1147   06/28/21 0000  Diet - low sodium heart healthy        06/28/21 0755   06/27/21 1457  Diet regular Room service appropriate? Yes; Fluid consistency: Thin  Diet effective now       Question Answer Comment  Room service appropriate? Yes   Fluid consistency: Thin      06/27/21 1456            DVT prophylaxis: SCDs Start: 06/27/21 1456 Place TED hose Start: 06/27/21 1456 rivaroxaban (XARELTO) tablet 20 mg     Code Status: Full Code  Family Communication: Family present at bedside  Status is: Inpatient  Level of care: Stepdown  Microbiology  none  Antimicrobials: none   Objective: Vitals:   07/04/21 0749 07/04/21 0800 07/04/21 1000 07/04/21 1100  BP:  133/68  101/65  Pulse:  (!) 103 98   Resp:  20 19 (!) 22  Temp: 97.8 F (36.6 C)   (!) 97.4 F (36.3 C)  TempSrc: Oral   Oral  SpO2: 91% (!) 88% (!) 89%   Weight:      Height:        Intake/Output Summary (Last 24 hours) at 07/04/2021 1258 Last data filed at 07/04/2021 1102 Gross per 24 hour  Intake --  Output 2250 ml  Net -2250 ml   Filed Weights   06/27/21 0944  Weight: 108.4 kg    Examination:  Constitutional: NAD Eyes: no scleral icterus ENMT: Mucous membranes are  moist.  Neck: normal, supple Respiratory: clear to auscultation bilaterally, no wheezing, no crackles. Normal respiratory effort. Overall distant breath sounds Cardiovascular: Regular rate and rhythm, no murmurs / rubs / gallops. No LE edema. Good peripheral pulses Abdomen: non distended, no tenderness. Bowel sounds positive.  Musculoskeletal: no clubbing / cyanosis.  Skin: no rashes Neurologic: No focal deficits  Data Reviewed: I have independently reviewed following labs and imaging studies   CBC: Recent Labs  Lab 06/29/21 0321 06/30/21 0326 07/01/21 0249 07/02/21 0248 07/03/21 0238  WBC 19.3* 16.6* 13.0* 17.9* 14.6*  HGB 12.7 12.3 12.5 12.2 12.7  HCT 38.1 39.0 39.1 38.1 39.9  MCV 100.8* 105.1* 102.6* 102.7* 103.1*  PLT 170 169 177 191 202   Basic Metabolic Panel: Recent Labs  Lab 06/29/21 0359 06/30/21 1811 07/01/21 0249 07/02/21 0248 07/03/21 0238  NA 135 136 135 137 135  K 4.4 4.8 4.2 3.7 3.6  CL 99 95* 93* 97* 93*  CO2 28 31 33* 35* 34*  GLUCOSE 162* 87 184* 125* 137*  BUN 34* 37* 39* 44* 36*  CREATININE 1.26* 1.28* 1.23* 1.12* 0.93  CALCIUM 9.0 8.9 8.5* 8.5* 8.5*  MG  --   --  1.9 2.0  --   PHOS  --   --  4.2  --   --    Liver Function Tests: No results for input(s): AST, ALT, ALKPHOS, BILITOT, PROT, ALBUMIN in the last 168 hours. Coagulation Profile: No results for input(s): INR, PROTIME in the last 168 hours. HbA1C: No results for input(s): HGBA1C in the last 72 hours. CBG: No results for input(s): GLUCAP in the last 168 hours.  Recent Results (from the past 240 hour(s))  SARS Coronavirus 2 by RT PCR (hospital order, performed in [facility] hospital lab) Nasopharyngeal Nasopharyngeal Swab     Status: None   Collection Time: 06/27/21  9:26 AM   Specimen: Nasopharyngeal Swab  Result Value Ref Range Status   SARS Coronavirus 2 NEGATIVE NEGATIVE Final    Comment: (NOTE) SARS-CoV-2 target nucleic acids are NOT DETECTED.  The SARS-CoV-2 RNA is  generally detectable in upper and lower respiratory specimens during the acute phase of infection. The lowest concentration of SARS-CoV-2 viral copies this assay can detect is 250 copies / mL. A negative result does not preclude SARS-CoV-2 infection and should not be used as the sole basis for treatment or other patient management decisions.  A negative result may occur with improper specimen collection / handling, submission of specimen other than nasopharyngeal swab, presence of viral mutation(s) within the areas targeted by this assay, and inadequate number of viral copies (<250 copies / mL). A negative result must be combined with clinical observations, patient history, and epidemiological information.  Fact Sheet for Patients:   BoilerBrush.com.cy  Fact Sheet for Healthcare Providers: https://pope.com/  This test is not yet approved or  cleared by the [place] FDA  and has been authorized for detection and/or diagnosis of SARS-CoV-2 by FDA under an Emergency Use Authorization (EUA).  This EUA will remain in effect (meaning this test can be used) for the duration of the COVID-19 declaration under Section 564(b)(1) of the Act, 21 U.S.C. section 360bbb-3(b)(1), unless the authorization is terminated or revoked sooner.  Performed at [facility], [address], [place], [place] [address]      Radiology Studies: No results found.   Pamella Pert, MD, PhD [facility]  Between 7 am - 7 pm I am available, please contact me via Amion (for emergencies) or Securechat (non urgent messages)  Between 7 pm - 7 am I am not available, please contact night coverage MD/APP via Amion

## 2021-07-04 NOTE — Progress Notes (Signed)
   Subjective: 7 Days Post-Op Procedure(s) (LRB): TOTAL KNEE REVISION (Right) Patient reports pain as mild.   Plan is to go  home vs rehab  after hospital stay.  Objective: Vital signs in last 24 hours: Temp:  [97.5 F (36.4 C)-98.9 F (37.2 C)] 98.9 F (37.2 C) (10/26 0400) Pulse Rate:  [92-105] 92 (10/26 0600) Resp:  [14-21] 16 (10/26 0600) BP: (127-147)/(60-86) 132/60 (10/26 0600) SpO2:  [88 %-96 %] 93 % (10/26 0600)  Intake/Output from previous day:  Intake/Output Summary (Last 24 hours) at 07/04/2021 0718 Last data filed at 07/03/2021 2300 Gross per 24 hour  Intake --  Output 1350 ml  Net -1350 ml    Intake/Output this shift: No intake/output data recorded.  Labs: Recent Labs    07/02/21 0248 07/03/21 0238  HGB 12.2 12.7   Recent Labs    07/02/21 0248 07/03/21 0238  WBC 17.9* 14.6*  RBC 3.71* 3.87  HCT 38.1 39.9  PLT 191 202   Recent Labs    07/02/21 0248 07/03/21 0238  NA 137 135  K 3.7 3.6  CL 97* 93*  CO2 35* 34*  BUN 44* 36*  CREATININE 1.12* 0.93  GLUCOSE 125* 137*  CALCIUM 8.5* 8.5*   No results for input(s): LABPT, INR in the last 72 hours.  EXAM General - Patient is Alert and Oriented Extremity - Neurologically intact Dorsiflexion/Plantar flexion intact No cellulitis present Compartment soft Dressing/Incision - clean, dry, no drainage Motor Function - intact, moving foot and toes well on exam.   Past Medical History:  Diagnosis Date   Anxiety    Arthritis    Atrial fibrillation (HCC)    Complication of anesthesia    used to get migraine headaches after surgery   Congestive heart failure (HCC)    COPD (chronic obstructive pulmonary disease) (HCC)    Dyspnea    if afib causes a problem   Pneumonia     Assessment/Plan: 7 Days Post-Op Procedure(s) (LRB): TOTAL KNEE REVISION (Right) Principal Problem:   Failed total knee arthroplasty (HCC) Active Problems:   Atrial fibrillation with rapid ventricular response (HCC)    COPD with acute exacerbation (HCC)   Status post revision of total knee replacement, right   Pressure injury of skin   Up with therapy Progressing very slowly. Will initiate process for SNF. If patient does better with PT and is safe for discharge then she potentially can go home but currently that is not looking favorable. She wants to go home but I reinforced to her that she has not met any goals for discharge and is not currently safe to go home  OK to transfer back to [place] 07/04/2021, 7:18 AM

## 2021-07-04 NOTE — [place] FL2 (Signed)
[place] MEDICAID FL2 LEVEL OF CARE SCREENING TOOL     IDENTIFICATION  Patient Name: Sherri Hart Block Birthdate: [birth year] Sex: female Admission Date (Current Location): 06/27/2021  [facility] and [place] Number:  Producer, television/film/video and Address:  [facility] [place],  [address] [place], [place] [address]      Provider Number: [id number]  Attending Physician Name and Address:  Ollen Gross, MD  Relative Name and Phone Number:  Cleatrice Burke (granddaughter) Ph: [phone]    Current Level of Care: Hospital Recommended Level of Care: Skilled Nursing Facility Prior Approval Number:    Date Approved/Denied:   PASRR Number: [id number]  Discharge Plan: SNF    Current Diagnoses: Patient Active Problem List   Diagnosis Date Noted   Pressure injury of skin 07/01/2021   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   Status post revision of total knee replacement, 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   Atrial fibrillation with RVR (HCC) 02/01/2021   Acute diastolic (congestive) heart failure (HCC) 07/05/2020   Elevated troponin level not due myocardial infarction 07/04/2020   Hypomagnesemia 07/04/2020   Atrial fibrillation with rapid ventricular response (HCC) 07/04/2020   Acute cardiogenic pulmonary edema (HCC) 07/04/2020   COPD with acute exacerbation (HCC) 07/04/2020   Nicotine dependence, cigarettes, uncomplicated 07/04/2020    Orientation RESPIRATION BLADDER Height & Weight     Self, Time, Situation, Place  O2 (6-8L/min) Continent Weight: 239 lb (108.4 kg) Height:  5\' 5"  (165.1 cm)  BEHAVIORAL SYMPTOMS/MOOD NEUROLOGICAL BOWEL NUTRITION STATUS   (N/A)  (N/A) Continent Diet (Regular diet)  AMBULATORY STATUS COMMUNICATION OF NEEDS Skin   Extensive Assist Verbally Surgical wounds, Skin abrasions, Other (Comment), PU Stage and Appropriate Care (Ecchymosis: bilateral arms; abrasion: bilateral arms, legs; PU: right pretibial (intact skin with non-blanchable redness of a localized  area usually over a bony prominence))                       Personal Care Assistance Level of Assistance  Bathing, Feeding, Dressing Bathing Assistance: Maximum assistance Feeding assistance: Limited assistance Dressing Assistance: Maximum assistance     Functional Limitations Info  Speech, Hearing, Sight Sight Info: Adequate Hearing Info: Adequate Speech Info: Adequate    SPECIAL CARE FACTORS FREQUENCY  PT (By licensed PT), OT (By licensed OT)     PT Frequency: 5x's/week OT Frequency: 5x's/week            Contractures Contractures Info: Not present    Additional Factors Info  Code Status, Allergies Code Status Info: Full Allergies Info: Penicillins           Current Medications (07/04/2021):  This is the current hospital active medication list Current Facility-Administered Medications  Medication Dose Route Frequency Provider Last Rate Last Admin   acetaminophen (TYLENOL) tablet 325-650 mg  325-650 mg Oral Q6H PRN 07/06/2021 D, PA-C   650 mg at 07/01/21 1219   albuterol (PROVENTIL) (2.5 MG/3ML) 0.083% nebulizer solution 2.5 mg  2.5 mg Nebulization BID Pokhrel, Laxman, MD   2.5 mg at 07/04/21 0748   albuterol (VENTOLIN HFA) 108 (90 Base) MCG/ACT inhaler 2 puff  2 puff Inhalation Q2H PRN 07/06/21, MD   2 puff at 07/01/21 1032   bisacodyl (DULCOLAX) suppository 10 mg  10 mg Rectal Daily PRN 07/03/21 D, PA-C       Chlorhexidine Gluconate Cloth 2 % PADS 6 each  6 each Topical Daily Pokhrel, Laxman, MD   6 each at 07/04/21 1103   diltiazem (CARDIZEM  CD) 24 hr capsule 180 mg  180 mg Oral Daily Nelia Shi D, PA-C   180 mg at 07/04/21 1103   diltiazem (CARDIZEM) injection 10 mg  10 mg Intravenous Q6H PRN Pokhrel, Laxman, MD   10 mg at 06/30/21 2226   diphenhydrAMINE (BENADRYL) 12.5 MG/5ML elixir 12.5-25 mg  12.5-25 mg Oral Q4H PRN Alyssa Grove, PA-C   25 mg at 06/29/21 2205   docusate sodium (COLACE) capsule 100 mg  100 mg Oral BID Nelia Shi D, PA-C   100 mg at 07/04/21 1103   [START ON 07/05/2021] furosemide (LASIX) tablet 40 mg  40 mg Oral Daily Leatha Gilding, MD       guaiFENesin (ROBITUSSIN) 100 MG/5ML liquid 5 mL  5 mL Oral Q4H PRN Ollen Gross, MD   5 mL at 07/03/21 2046   levalbuterol (XOPENEX) nebulizer solution 0.63 mg  0.63 mg Nebulization Q6H PRN Pokhrel, Laxman, MD       loratadine (CLARITIN) tablet 10 mg  10 mg Oral Daily Ollen Gross, MD   10 mg at 07/04/21 1103   MEDLINE mouth rinse  15 mL Mouth Rinse BID Ollen Gross, MD   15 mL at 07/03/21 2121   menthol-cetylpyridinium (CEPACOL) lozenge 3 mg  1 lozenge Oral PRN Nelia Shi D, PA-C       Or   phenol (CHLORASEPTIC) mouth spray 1 spray  1 spray Mouth/Throat PRN Nelia Shi D, PA-C       methocarbamol (ROBAXIN) tablet 500 mg  500 mg Oral Q6H PRN Nelia Shi D, PA-C   500 mg at 07/01/21 1817   Or   methocarbamol (ROBAXIN) 500 mg in dextrose 5 % 50 mL IVPB  500 mg Intravenous Q6H PRN Theda Belfast, Sean D, PA-C       metoCLOPramide (REGLAN) tablet 5-10 mg  5-10 mg Oral Q8H PRN Theda Belfast, Sean D, PA-C       Or   metoCLOPramide (REGLAN) injection 5-10 mg  5-10 mg Intravenous Q8H PRN Theda Belfast, Sean D, PA-C       morphine 2 MG/ML injection 0.5-1 mg  0.5-1 mg Intravenous Q2H PRN Nelia Shi D, PA-C   1 mg at 06/30/21 1720   ondansetron (ZOFRAN) tablet 4 mg  4 mg Oral Q6H PRN Nelia Shi D, PA-C       Or   ondansetron [facility]) injection 4 mg  4 mg Intravenous Q6H PRN Nelia Shi D, PA-C       oxyCODONE (Oxy IR/ROXICODONE) immediate release tablet 5-10 mg  5-10 mg Oral Q4H PRN Nelia Shi D, PA-C   5 mg at 07/04/21 0524   polyethylene glycol (MIRALAX / GLYCOLAX) packet 17 g  17 g Oral Daily PRN Alyssa Grove, PA-C   17 g at 07/03/21 0902   rivaroxaban (XARELTO) tablet 20 mg  20 mg Oral Q breakfast Cassandria Anger, PA-C   20 mg at 07/04/21 9924   sodium chloride (OCEAN) 0.65 % nasal spray 1 spray  1 spray Each Nare PRN Ollen Gross, MD   1 spray at 07/01/21 1213   sodium phosphate (FLEET) 7-19 GM/118ML enema 1 enema  1 enema Rectal Once PRN Nelia Shi D, PA-C       traMADol Janean Sark) tablet 50 mg  50 mg Oral Q6H PRN Nelia Shi D, PA-C   50 mg at 06/28/21 2683     Discharge Medications: Please see discharge summary for a list of discharge medications.  Relevant Imaging Results:  Relevant Lab Results:  Additional Information SSN: [id number]  Ewing Schlein, LCSW

## 2021-07-04 NOTE — TOC Progression Note (Signed)
Transition of Care [facility]) - Progression Note   Patient Details  Name: Sherri Hart MRN: [id number] Date of Birth: 11-Mar-1946  [facility]) CM/SW Contact  Ewing Schlein, LCSW Phone Number: 07/04/2021, 1:22 PM  Clinical Narrative: Patient is now being recommended for SNF due to lack of progress with PT. CSW spoke with patient who is now agreeable to SNF. Patient is vaccinated and boosted x1 for COVID.  FL2 done; PASRR received. Initial referral faxed out. Patient is still on 6-8L/min O2, so this will likely affect bed offers unless she can be weaned to a lower liter flow. TOC awaiting bed offers.  Expected Discharge Plan: Home w Home Health Services Barriers to Discharge: Continued Medical Work up  Expected Discharge Plan and Services Expected Discharge Plan: Home w Home Health Services In-house Referral: Clinical Social Work Living arrangements for the past 2 months: Single Family Home Expected Discharge Date: 07/04/21               DME Arranged: 3-N-1 DME Agency: [employer] Date DME Agency Contacted: 06/30/21 Time DME Agency Contacted: [phone] Representative spoke with at DME Agency: Velna Hatchet  Readmission Risk Interventions No flowsheet data found.

## 2021-07-04 NOTE — Progress Notes (Signed)
Physical Therapy Treatment Patient Details Name: Sherri Hart MRN: [id number] DOB: [birth year] Today's Date: 07/04/2021   History of Present Illness Patient is 75 y.o. female s/p Rt TKR on 06/27/21 with PMH significant for OA, anxiety, A-fib, COPD, CHF, back surgery.  06/30/21 Rapid response called with pt in afib with RVR and pt transferred to step-down unit.    PT Comments    Patient slightly more awake but not engaging in mobility and standing. Did not  attempt any steps when cues while standing. Patient Sits back down. Continue PT   Recommendations for follow up therapy are one component of a multi-disciplinary discharge planning process, led by the attending physician.  Recommendations may be updated based on patient status, additional functional criteria and insurance authorization.  Follow Up Recommendations  Skilled nursing-short term rehab (<3 hours/day)     Assistance Recommended at Discharge Frequent or constant Supervision/Assistance  Equipment Recommendations  None recommended by PT    Recommendations for Other Services       Precautions / Restrictions Precautions Precautions: Fall;Knee Required Braces or Orthoses: Knee Immobilizer - Right Knee Immobilizer - Right: Discontinue once straight leg raise with < 10 degree lag Restrictions Weight Bearing Restrictions: No RLE Weight Bearing: Weight bearing as tolerated     Mobility  Bed Mobility   Bed Mobility: Supine to Sit;Sit to Supine     Supine to sit: Mod assist Sit to supine: Max assist;+2 for physical assistance;+2 for safety/equipment   General bed mobility comments: patient does not participate with much cues and tactile cues.    Transfers Overall transfer level: Needs assistance Equipment used: Rolling walker (2 wheels)   Sit to Stand: +2 safety/equipment;+2 physical assistance;Max assist Stand pivot transfers: Max assist;+2 safety/equipment;+2 physical assistance         General transfer  comment: stood x 2, attempted side steps, patient did not perform    Ambulation/Gait                 Stairs             Wheelchair Mobility    Modified Rankin (Stroke Patients Only)       Balance Overall balance assessment: Needs assistance Sitting-balance support: Feet supported;Bilateral upper extremity supported Sitting balance-Leahy Scale: Fair Sitting balance - Comments: requires cues stay aroused and sit up   Standing balance support: During functional activity;Bilateral upper extremity supported Standing balance-Leahy Scale: Poor Standing balance comment: patient  lethargic and not focussed.                            Cognition Arousal/Alertness: Lethargic   Overall Cognitive Status: Impaired/Different from baseline Area of Impairment: Orientation;Attention;Following commands;Awareness                 Orientation Level: Time;Situation Current Attention Level: Focused       Awareness: Intellectual   General Comments: slightly more alert, does not  follow directionsm, requires frequent cues and encouragement.        Exercises      General Comments        Pertinent Vitals/Pain Faces Pain Scale: Hurts even more Pain Location: Rt knee Pain Descriptors / Indicators: Moaning Pain Intervention(s): Limited activity within patient's tolerance    Home Living                          Prior Function  PT Goals (current goals can now be found in the care plan section) Progress towards PT goals: Not progressing toward goals - comment (remains lethargic)    Frequency    Min 4X/week      PT Plan Current plan remains appropriate    Co-evaluation              AM-PAC PT "6 Clicks" Mobility   Outcome Measure  Help needed turning from your back to your side while in a flat bed without using bedrails?: A Lot Help needed moving from lying on your back to sitting on the side of a flat bed without  using bedrails?: A Lot Help needed moving to and from a bed to a chair (including a wheelchair)?: A Lot Help needed standing up from a chair using your arms (e.g., wheelchair or bedside chair)?: A Lot Help needed to walk in hospital room?: Total Help needed climbing 3-5 steps with a railing? : Total 6 Click Score: 10    End of Session Equipment Utilized During Treatment: Gait belt;Right knee immobilizer Activity Tolerance: Patient limited by fatigue;Patient limited by lethargy Patient left: in bed;with call bell/phone within reach;with bed alarm set Nurse Communication: Mobility status PT Visit Diagnosis: Other abnormalities of gait and mobility (R26.89);Muscle weakness (generalized) (M62.81);Difficulty in walking, not elsewhere classified (R26.2);Pain Pain - Right/Left: Right Pain - part of body: Knee     Time: 1500-1535 PT Time Calculation (min) (ACUTE ONLY): 35 min  Charges:  $Therapeutic Activity: 23-37 mins                     Blanchard Kelch PT Acute Rehabilitation Services Pager [phone] Office [phone]   Rada Hay 07/04/2021, 6:00 PM

## 2021-07-05 DIAGNOSIS — R0603 Acute respiratory distress: Secondary | ICD-10-CM | POA: Diagnosis not present

## 2021-07-05 DIAGNOSIS — Z96659 Presence of unspecified artificial knee joint: Secondary | ICD-10-CM | POA: Diagnosis not present

## 2021-07-05 DIAGNOSIS — I4891 Unspecified atrial fibrillation: Secondary | ICD-10-CM | POA: Diagnosis not present

## 2021-07-05 DIAGNOSIS — T84018A Broken internal joint prosthesis, other site, initial encounter: Secondary | ICD-10-CM | POA: Diagnosis not present

## 2021-07-05 NOTE — Progress Notes (Signed)
Orthopedic Tech Progress Note Patient Details:  Sherri Hart Block 09/05/1946 [id number]  Patient ID: Sherri Hart, female   DOB: [birth year], 75 y.o.   MRN: [id number] In chart to view notes regarding CMP usage.  Sherri Hart 07/05/2021, 10:42 AM

## 2021-07-05 NOTE — Progress Notes (Signed)
Physical Therapy Treatment Patient Details Name: Sherri Hart MRN: [id number] DOB: [birth year] Today's Date: 07/05/2021   History of Present Illness Patient is 75 y.o. female s/p Rt TKR on 06/27/21 with PMH significant for OA, anxiety, A-fib, COPD, CHF, back surgery.  06/30/21 Rapid response called with pt in afib with RVR and pt transferred to step-down unit.    PT Comments    Pt continues agreeable and with marked improvement over yesterday but fatigues easily and pain limited.  Recommendations for follow up therapy are one component of a multi-disciplinary discharge planning process, led by the attending physician.  Recommendations may be updated based on patient status, additional functional criteria and insurance authorization.  Follow Up Recommendations  Skilled nursing-short term rehab (<3 hours/day)     Assistance Recommended at Discharge Frequent or constant Supervision/Assistance  Equipment Recommendations  None recommended by PT    Recommendations for Other Services       Precautions / Restrictions Precautions Precautions: Fall;Knee Required Braces or Orthoses: Knee Immobilizer - Right Knee Immobilizer - Right: Discontinue once straight leg raise with < 10 degree lag Restrictions Weight Bearing Restrictions: No RLE Weight Bearing: Weight bearing as tolerated Other Position/Activity Restrictions: WBAT     Mobility  Bed Mobility Overal bed mobility: Needs Assistance Bed Mobility: Supine to Sit     Supine to sit: Min assist;Mod assist     General bed mobility comments: cues for sequence and use of L LE to self assist    Transfers Overall transfer level: Needs assistance Equipment used: Rolling walker (2 wheels) Transfers: Sit to/from [employer] Transfers Sit to Stand: +2 safety/equipment;Min assist;Mod assist;From elevated surface Stand pivot transfers: Min assist;Mod assist;From elevated surface;+2 safety/equipment         General transfer  comment: cues for LE management and use of UEs to self assist.  Physical assist to bring wt up and fwd and balance in standing.  Stand/pvt with RW bed to [facility]    Ambulation/Gait Ambulation/Gait assistance: Min assist;+2 safety/equipment Gait Distance (Feet): 16 Feet Assistive device: Rolling walker (2 wheels) Gait Pattern/deviations: Step-to pattern;Decreased step length - left;Decreased stance time - right;Shuffle;Trunk flexed Gait velocity: decr   General Gait Details: cues for posture, sequence, position from RW and safety with additional cues to focus pt on task and negate unsafe impulsive movements.  Distance [employer] by [employer] Rankin (Stroke Patients Only)       Balance Overall balance assessment: Needs assistance Sitting-balance support: Feet supported;No upper extremity supported Sitting balance-Leahy Scale: Good     Standing balance support: During functional activity;Bilateral upper extremity supported Standing balance-Leahy Scale: Poor                              Cognition Arousal/Alertness: Awake/alert Behavior During Therapy: Flat affect Overall Cognitive Status: Within Functional Limits for tasks assessed                         Following Commands: Follows one step commands consistently                Exercises      General Comments        Pertinent Vitals/Pain Pain Assessment: 0-10 Pain Score: 5  Pain Location: Rt knee Pain Descriptors / Indicators: Aching;Grimacing;Sore Pain Intervention(s): Limited activity within patient's tolerance;Monitored  during session;Premedicated before session;Ice applied    Home Living                          Prior Function            PT Goals (current goals can now be found in the care plan section) Acute Rehab PT Goals Patient Stated Goal: Go HOME PT Goal Formulation: With patient Time For Goal  Achievement: 07/16/21 Potential to Achieve Goals: Fair Progress towards PT goals: Progressing toward goals    Frequency    Min 4X/week      PT Plan Current plan remains appropriate    Co-evaluation              AM-PAC PT "6 Clicks" Mobility   Outcome Measure  Help needed turning from your back to your side while in a flat bed without using bedrails?: A Little Help needed moving from lying on your back to sitting on the side of a flat bed without using bedrails?: A Little Help needed moving to and from a bed to a chair (including a wheelchair)?: A Lot Help needed standing up from a chair using your arms (e.g., wheelchair or bedside chair)?: A Lot Help needed to walk in hospital room?: A Lot Help needed climbing 3-5 steps with a railing? : Total 6 Click Score: 13    End of Session Equipment Utilized During Treatment: Gait belt;Right knee immobilizer Activity Tolerance: Patient limited by fatigue;Patient tolerated treatment well Patient left: in chair;with call bell/phone within reach;with chair alarm set;with family/visitor present Nurse Communication: Mobility status PT Visit Diagnosis: Other abnormalities of gait and mobility (R26.89);Muscle weakness (generalized) (M62.81);Difficulty in walking, not elsewhere classified (R26.2);Pain Pain - Right/Left: Right Pain - part of body: Knee     Time: 1137-1207 PT Time Calculation (min) (ACUTE ONLY): 30 min  Charges:  $Gait Training: 8-22 mins $Therapeutic Activity: 8-22 mins                     Sherri Hart PT Acute Rehabilitation Services Pager [phone] Office [phone]    Sherri Hart 07/05/2021, 12:52 PM

## 2021-07-05 NOTE — TOC Progression Note (Signed)
Transition of Care [facility]) - Progression Note    Patient Details  Name: Sherri Hart MRN: [id number] Date of Birth: November 27, 1945  [facility]) CM/SW Contact  Amada Jupiter, LCSW Phone Number: 07/05/2021, 12:40 PM  Clinical Narrative:    Have presented SNF bed offers to pt and she asks that I review with her granddaughter to make choice.  Have left VM and text message to contact number on chart and await return call.   Expected Discharge Plan: Home w Home Health Services Barriers to Discharge: Continued Medical Work up  Expected Discharge Plan and Services Expected Discharge Plan: Home w Home Health Services In-house Referral: Clinical Social Work     Living arrangements for the past 2 months: Single Family Home Expected Discharge Date: 07/04/21               DME Arranged: 3-N-1 DME Agency: [employer] Date DME Agency Contacted: 06/30/21 Time DME Agency Contacted: [phone] Representative spoke with at DME Agency: Velna Hatchet             Social Determinants of Health (SDOH) Interventions    Readmission Risk Interventions No flowsheet data found.

## 2021-07-05 NOTE — Progress Notes (Signed)
   Subjective: 8 Days Post-Op Procedure(s) (LRB): TOTAL KNEE REVISION (Right) Patient reports pain as mild.   Plan is to go home after hospital stay.   Objective: Vital signs in last 24 hours: Temp:  [97.3 F (36.3 C)-98.4 F (36.9 C)] 97.3 F (36.3 C) (10/27 0614) Pulse Rate:  [82-98] 88 (10/27 0614) Resp:  [16-23] 16 (10/27 0614) BP: (101-134)/(44-94) 114/94 (10/27 0614) SpO2:  [85 %-99 %] 92 % (10/27 0732)  Intake/Output from previous day:  Intake/Output Summary (Last 24 hours) at 07/05/2021 0823 Last data filed at 07/05/2021 0600 Gross per 24 hour  Intake 120 ml  Output 1300 ml  Net -1180 ml    Intake/Output this shift: No intake/output data recorded.  Labs: Recent Labs    07/03/21 0238  HGB 12.7   Recent Labs    07/03/21 0238  WBC 14.6*  RBC 3.87  HCT 39.9  PLT 202   Recent Labs    07/03/21 0238  NA 135  K 3.6  CL 93*  CO2 34*  BUN 36*  CREATININE 0.93  GLUCOSE 137*  CALCIUM 8.5*   No results for input(s): LABPT, INR in the last 72 hours.  Exam: General - Patient is Alert and Oriented Extremity - Neurologically intact Neurovascular intact Intact pulses distally Dorsiflexion/Plantar flexion intact Dressing/Incision - clean, dry, no drainage Motor Function - intact, moving foot and toes well on exam.   Past Medical History:  Diagnosis Date   Anxiety    Arthritis    Atrial fibrillation (HCC)    Complication of anesthesia    used to get migraine headaches after surgery   Congestive heart failure (HCC)    COPD (chronic obstructive pulmonary disease) (HCC)    Dyspnea    if afib causes a problem   Pneumonia     Assessment/Plan: 8 Days Post-Op Procedure(s) (LRB): TOTAL KNEE REVISION (Right) Principal Problem:   Failed total knee arthroplasty (HCC) Active Problems:   Atrial fibrillation with rapid ventricular response (HCC)   COPD with acute exacerbation (HCC)   Status post revision of total knee replacement, right   Pressure  injury of skin  Estimated body mass index is 39.77 kg/m as calculated from the following:   Height as of this encounter: 5\' 5"  (1.651 m).   Weight as of this encounter: 108.4 kg. Up with therapy  DVT Prophylaxis - Xarelto and TED hose Weight-bearing as tolerated  Patient transferred back to 3W yesterday. Plan to discharge to SNF based upon availability. Discussed again with patient, reasoning for moving her to SNF as she is still not meeting goals with PT.    , MBA, PA-C Orthopedic Surgery [phone] 07/05/2021, 8:23 AM

## 2021-07-05 NOTE — Progress Notes (Signed)
Physical Therapy Treatment Patient Details Name: Sherri Hart MRN: [id number] DOB: 07-05-1946 Today's Date: 07/05/2021   History of Present Illness Patient is 75 y.o. female s/p Rt TKR on 06/27/21 with PMH significant for OA, anxiety, A-fib, COPD, CHF, back surgery.  06/30/21 Rapid response called with pt in afib with RVR and pt transferred to step-down unit.    PT Comments    Pt participating with PT and following cues - increased alertness vs last several days.  Pt requests OOB deferred to after bfast and next pain meds.  Pt performed therex program with assist and pt participating throughout but with cues to fucus on task and multiple rest breaks.   Recommendations for follow up therapy are one component of a multi-disciplinary discharge planning process, led by the attending physician.  Recommendations may be updated based on patient status, additional functional criteria and insurance authorization.  Follow Up Recommendations  Skilled nursing-short term rehab (<3 hours/day)     Assistance Recommended at Discharge Frequent or constant Supervision/Assistance  Equipment Recommendations  None recommended by PT    Recommendations for Other Services       Precautions / Restrictions Precautions Precautions: Fall;Knee Required Braces or Orthoses: Knee Immobilizer - Right Knee Immobilizer - Right: Discontinue once straight leg raise with < 10 degree lag Restrictions Weight Bearing Restrictions: No RLE Weight Bearing: Weight bearing as tolerated Other Position/Activity Restrictions: WBAT     Mobility  Bed Mobility               General bed mobility comments: deferred to after bfast and next pain meds    Transfers                        Ambulation/Gait                 Stairs             Wheelchair Mobility    Modified Rankin (Stroke Patients Only)       Balance                                             Cognition Arousal/Alertness: Awake/alert Behavior During Therapy: Flat affect Overall Cognitive Status: Within Functional Limits for tasks assessed                         Following Commands: Follows one step commands consistently                Exercises Total Joint Exercises Ankle Circles/Pumps: AROM;Both;20 reps;Supine Quad Sets: AROM;Both;Supine;15 reps Heel Slides: AAROM;Right;Supine;15 reps Hip ABduction/ADduction: AAROM;Right;Supine;15 reps Straight Leg Raises: AAROM;Right;Supine;15 reps Goniometric ROM: -4 - 65    General Comments        Pertinent Vitals/Pain Pain Assessment: 0-10 Pain Score: 6  Pain Location: Rt knee Pain Descriptors / Indicators: Aching;Grimacing;Sore Pain Intervention(s): Limited activity within patient's tolerance;Monitored during session;Premedicated before session;Ice applied    Home Living                          Prior Function            PT Goals (current goals can now be found in the care plan section) Acute Rehab PT Goals Patient Stated Goal: Go HOME PT Goal Formulation: With patient Time For Goal  Achievement: 07/16/21 Potential to Achieve Goals: Fair Progress towards PT goals: Progressing toward goals    Frequency    Min 4X/week      PT Plan Current plan remains appropriate    Co-evaluation              AM-PAC PT "6 Clicks" Mobility   Outcome Measure  Help needed turning from your back to your side while in a flat bed without using bedrails?: A Lot Help needed moving from lying on your back to sitting on the side of a flat bed without using bedrails?: A Lot Help needed moving to and from a bed to a chair (including a wheelchair)?: A Lot Help needed standing up from a chair using your arms (e.g., wheelchair or bedside chair)?: A Lot Help needed to walk in hospital room?: Total Help needed climbing 3-5 steps with a railing? : Total 6 Click Score: 10    End of Session   Activity  Tolerance: Patient limited by fatigue;Patient limited by pain Patient left: in bed;with call bell/phone within reach;with bed alarm set   PT Visit Diagnosis: Other abnormalities of gait and mobility (R26.89);Muscle weakness (generalized) (M62.81);Difficulty in walking, not elsewhere classified (R26.2);Pain Pain - Right/Left: Right Pain - part of body: Knee     Time: 0815-0832 PT Time Calculation (min) (ACUTE ONLY): 17 min  Charges:  $Therapeutic Exercise: 8-22 mins                     Mauro Kaufmann PT Acute Rehabilitation Services Pager [phone] Office [phone]    Alaisha Eversley 07/05/2021, 8:42 AM

## 2021-07-05 NOTE — Progress Notes (Signed)
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  Patient does have baseline shortness of breath and is on 4 L of oxygen at home though she is not quite compliant with it.  Medical team was consulted due to increasing oxygen demand postoperatively. Patient was also noted to have atrial fibrillation with RVR so hospitalist team was consulted for further evaluation and treatment.  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  Currently on nasal cannula.  Currently on her baseline oxygen demand at 4 to 6 L..  Feel better.  Was encouraged on deep breathing and incentive spirometry.  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  Management as per primary team.    Permanent atrial fibrillation with RVR-Improved after pain management.  Mild tachycardia likely secondary to albuterol.  Continue Cardizem and Xarelto.  On Xopenex at this time   Acute COPD exacerbation-Patient is on home oxygen at 4 L/min but was not really compliant at home.  Continue bronchodilators.  Changed to Xopenex due to tachycardia.  Improved no active wheezing.   Chronic diastolic congestive heart failure -continue oral Lasix  Scheduled Meds:  albuterol  2.5 mg Nebulization BID   Chlorhexidine Gluconate Cloth  6 each Topical Daily   diltiazem  180 mg Oral Daily   docusate sodium  100 mg Oral  BID   furosemide  40 mg Oral Daily   loratadine  10 mg Oral Daily   mouth rinse  15 mL Mouth Rinse BID   rivaroxaban  20 mg Oral Q breakfast   Continuous Infusions:  methocarbamol (ROBAXIN) IV     PRN Meds:.acetaminophen, albuterol, bisacodyl, diltiazem, diphenhydrAMINE, guaiFENesin, levalbuterol, menthol-cetylpyridinium **OR** phenol, methocarbamol **OR** methocarbamol (ROBAXIN) IV, metoCLOPramide **OR** metoCLOPramide (REGLAN) injection, morphine injection, ondansetron **OR** ondansetron (ZOFRAN) IV, oxyCODONE, polyethylene glycol, sodium chloride, sodium phosphate, traMADol  Diet Orders (From admission, onward)     Start     Ordered   07/04/21 0000  Diet - low sodium heart healthy        07/04/21 1147   06/28/21 0000  Diet - low sodium heart healthy        06/28/21 0755   06/27/21 1457  Diet regular Room service appropriate? Yes; Fluid consistency: Thin  Diet effective now       Question Answer Comment  Room service appropriate? Yes   Fluid consistency: Thin      06/27/21 1456            DVT prophylaxis: rivaroxaban (XARELTO) tablet 20 mg Start: 07/01/21 0800 SCDs Start: 06/27/21 1456 Place TED hose Start: 06/27/21 1456 rivaroxaban (XARELTO) tablet 20 mg     Code Status: Full Code  Family Communication: Family present at bedside  Status is: Inpatient  Level of care: Med-Surg  Microbiology  none  Antimicrobials: none   Objective: Vitals:   07/05/21 0136 07/05/21 0614 07/05/21 0732 07/05/21 0927  BP: 134/83 (!) 114/94  110/71  Pulse: 95  88  87  Resp: 18 16  18   Temp: (!) 97.4 F (36.3 C) (!) 97.3 F (36.3 C)  (!) 97.5 F (36.4 C)  TempSrc: Oral Oral    SpO2: 94% (!) 85% 92% 90%  Weight:      Height:        Intake/Output Summary (Last 24 hours) at 07/05/2021 1017 Last data filed at 07/05/2021 1000 Gross per 24 hour  Intake 360 ml  Output 1050 ml  Net -690 ml    Filed Weights   06/27/21 0944  Weight: 108.4 kg     Examination:  Constitutional: No distress Eyes: Anicteric ENMT: mmm Neck: normal, supple Respiratory: CTA bilaterally, no wheezing, normal respiratory effort Cardiovascular: Regular rate and rhythm, no peripheral edema Abdomen: Soft, NT, ND, bowel sounds positive Musculoskeletal: no clubbing / cyanosis.  Skin: No rash seen Neurologic: Nonfocal  Data Reviewed: I have independently reviewed following labs and imaging studies   CBC: Recent Labs  Lab 06/29/21 0321 06/30/21 0326 07/01/21 0249 07/02/21 0248 07/03/21 0238  WBC 19.3* 16.6* 13.0* 17.9* 14.6*  HGB 12.7 12.3 12.5 12.2 12.7  HCT 38.1 39.0 39.1 38.1 39.9  MCV 100.8* 105.1* 102.6* 102.7* 103.1*  PLT 170 169 177 191 202    Basic Metabolic Panel: Recent Labs  Lab 06/29/21 0359 06/30/21 1811 07/01/21 0249 07/02/21 0248 07/03/21 0238  NA 135 136 135 137 135  K 4.4 4.8 4.2 3.7 3.6  CL 99 95* 93* 97* 93*  CO2 28 31 33* 35* 34*  GLUCOSE 162* 87 184* 125* 137*  BUN 34* 37* 39* 44* 36*  CREATININE 1.26* 1.28* 1.23* 1.12* 0.93  CALCIUM 9.0 8.9 8.5* 8.5* 8.5*  MG  --   --  1.9 2.0  --   PHOS  --   --  4.2  --   --     Liver Function Tests: No results for input(s): AST, ALT, ALKPHOS, BILITOT, PROT, ALBUMIN in the last 168 hours. Coagulation Profile: No results for input(s): INR, PROTIME in the last 168 hours. HbA1C: No results for input(s): HGBA1C in the last 72 hours. CBG: No results for input(s): GLUCAP in the last 168 hours.  Recent Results (from the past 240 hour(s))  SARS Coronavirus 2 by RT PCR (hospital order, performed in [facility] hospital lab) Nasopharyngeal Nasopharyngeal Swab     Status: None   Collection Time: 06/27/21  9:26 AM   Specimen: Nasopharyngeal Swab  Result Value Ref Range Status   SARS Coronavirus 2 NEGATIVE NEGATIVE Final    Comment: (NOTE) SARS-CoV-2 target nucleic acids are NOT DETECTED.  The SARS-CoV-2 RNA is generally detectable in upper and lower respiratory specimens  during the acute phase of infection. The lowest concentration of SARS-CoV-2 viral copies this assay can detect is 250 copies / mL. A negative result does not preclude SARS-CoV-2 infection and should not be used as the sole basis for treatment or other patient management decisions.  A negative result may occur with improper specimen collection / handling, submission of specimen other than nasopharyngeal swab, presence of viral mutation(s) within the areas targeted by this assay, and inadequate number of viral copies (<250 copies / mL). A negative result must be combined with clinical observations, patient history, and epidemiological information.  Fact Sheet for Patients:   06/29/21  Fact Sheet for Healthcare Providers: BoilerBrush.com.cy  This test is not yet approved or  cleared by the https://pope.com/ FDA and has been authorized for detection and/or diagnosis of SARS-CoV-2 by FDA  under an Emergency Use Authorization (EUA).  This EUA will remain in effect (meaning this test can be used) for the duration of the COVID-19 declaration under Section 564(b)(1) of the Act, 21 U.S.C. section 360bbb-3(b)(1), unless the authorization is terminated or revoked sooner.  Performed at [facility], [address] [place], [place] [address]       Radiology Studies: No results found.   Pamella Pert, MD, PhD [facility]  Between 7 am - 7 pm I am available, please contact me via Amion (for emergencies) or Securechat (non urgent messages)  Between 7 pm - 7 am I am not available, please contact night coverage MD/APP via Amion

## 2021-07-06 DIAGNOSIS — J441 Chronic obstructive pulmonary disease with (acute) exacerbation: Secondary | ICD-10-CM | POA: Diagnosis not present

## 2021-07-06 LAB — SARS CORONAVIRUS 2 (TAT 6-24 HRS): SARS Coronavirus 2: NEGATIVE

## 2021-07-06 NOTE — TOC Transition Note (Signed)
Transition of Care [facility]) - CM/SW Discharge Note   Patient Details  Name: Sherri Hart MRN: [id number] Date of Birth: 01-15-1946  [facility] of [facility] CM/SW Contact:  Amada Jupiter, LCSW Phone Number: 07/06/2021, 11:38 AM   Clinical Narrative:     Pt and granddaughter have accepted a SNF bed offer at [facility] who can admit pt today.  Have received insurance authorization.  PTAR called at 11:35am.  RN to call report to [phone].  No further TOC needs.  Final next level of care: Skilled Nursing Facility Barriers to Discharge: Barriers Resolved   Patient Goals and CMS Choice Patient states their goals for this hospitalization and ongoing recovery are:: return home      Discharge Placement PASRR number recieved: 07/04/21            Patient chooses bed at: [facility] [place] Patient to be transferred to facility by: PTAR Name of family member notified: granddaughter Patient and family notified of of transfer: 07/06/21  Discharge Plan and Services In-house Referral: Clinical Social Work              DME Arranged: 3-N-1 DME Agency: [employer] Date DME Agency Contacted: 06/30/21 Time DME Agency Contacted: [phone] Representative spoke with at DME Agency: Velna Hatchet            Social Determinants of Health (SDOH) Interventions     Readmission Risk Interventions No flowsheet data found.

## 2021-07-06 NOTE — Progress Notes (Signed)
Report called to [facility], waiting on PTAR.

## 2021-07-06 NOTE — Progress Notes (Signed)
Physical Therapy Treatment Patient Details Name: Sherri Hart MRN: [id number] DOB: Oct 06, 1945 Today's Date: 07/06/2021   History of Present Illness Patient is 75 y.o. female s/p Rt TKR on 06/27/21 with PMH significant for OA, anxiety, A-fib, COPD, CHF, back surgery.  06/30/21 Rapid response called with pt in afib with RVR and pt transferred to step-down unit.    PT Comments    Pt performed therex program with assist.  OOB deferred to later at pt request 2* increased pain and headache - RN aware.   Recommendations for follow up therapy are one component of a multi-disciplinary discharge planning process, led by the attending physician.  Recommendations may be updated based on patient status, additional functional criteria and insurance authorization.  Follow Up Recommendations  Skilled nursing-short term rehab (<3 hours/day)     Assistance Recommended at Discharge Frequent or constant Supervision/Assistance  Equipment Recommendations  None recommended by PT    Recommendations for Other Services       Precautions / Restrictions Precautions Precautions: Fall;Knee Required Braces or Orthoses: Knee Immobilizer - Right Knee Immobilizer - Right: Discontinue once straight leg raise with < 10 degree lag Restrictions Weight Bearing Restrictions: No RLE Weight Bearing: Weight bearing as tolerated     Mobility  Bed Mobility                    Transfers                        Ambulation/Gait                 Stairs             Wheelchair Mobility    Modified Rankin (Stroke Patients Only)       Balance                                            Cognition Arousal/Alertness: Awake/alert Behavior During Therapy: Flat affect Overall Cognitive Status: Within Functional Limits for tasks assessed Area of Impairment: Orientation;Attention;Following commands;Awareness                 Orientation Level:  Time;Situation Current Attention Level: Focused   Following Commands: Follows one step commands consistently   Awareness: Intellectual   General Comments: slightly more alert, does not  follow directionsm, requires frequent cues and encouragement.        Exercises Total Joint Exercises Ankle Circles/Pumps: AROM;Both;20 reps;Supine Quad Sets: AROM;Both;Supine;15 reps Heel Slides: AAROM;Right;Supine;15 reps Hip ABduction/ADduction: AAROM;Right;Supine;15 reps Straight Leg Raises: AAROM;Right;Supine;15 reps Goniometric ROM: -5 - 45 - pain limited    General Comments        Pertinent Vitals/Pain Pain Assessment: 0-10 Pain Score: 8  Pain Location: Rt knee Pain Descriptors / Indicators: Aching;Grimacing;Sore Pain Intervention(s): Limited activity within patient's tolerance;Monitored during session;Premedicated before session;Ice applied;Patient requesting pain meds-RN notified    Home Living                          Prior Function            PT Goals (current goals can now be found in the care plan section) Acute Rehab PT Goals Patient Stated Goal: Go HOME PT Goal Formulation: With patient Time For Goal Achievement: 07/16/21 Potential to Achieve Goals: Fair Progress towards PT goals: Progressing toward goals  Frequency    Min 4X/week      PT Plan Current plan remains appropriate    Co-evaluation              AM-PAC PT "6 Clicks" Mobility   Outcome Measure  Help needed turning from your back to your side while in a flat bed without using bedrails?: A Little Help needed moving from lying on your back to sitting on the side of a flat bed without using bedrails?: A Little Help needed moving to and from a bed to a chair (including a wheelchair)?: A Lot Help needed standing up from a chair using your arms (e.g., wheelchair or bedside chair)?: A Lot Help needed to walk in hospital room?: A Lot Help needed climbing 3-5 steps with a railing? :  Total 6 Click Score: 13    End of Session Equipment Utilized During Treatment: Gait belt;Right knee immobilizer Activity Tolerance: Patient limited by fatigue;Patient tolerated treatment well Patient left: in chair;with call bell/phone within reach;with chair alarm set;with family/visitor present Nurse Communication: Mobility status PT Visit Diagnosis: Other abnormalities of gait and mobility (R26.89);Muscle weakness (generalized) (M62.81);Difficulty in walking, not elsewhere classified (R26.2);Pain Pain - Right/Left: Right Pain - part of body: Knee     Time: 1034-1100 PT Time Calculation (min) (ACUTE ONLY): 26 min  Charges:  $Therapeutic Exercise: 8-22 mins                     Sherri Hart PT Acute Rehabilitation Services Pager [phone] Office [phone]    Sherri Hart 07/06/2021, 12:20 PM

## 2021-07-06 NOTE — Discharge Summary (Signed)
Physician Discharge Summary   Patient ID: Sherri Hart MRN: [id number] DOB/AGE: 1946/01/07 75 y.o.  Admit date: 06/27/2021 Discharge date:   Primary Diagnosis: s/p Right TKA Revision   Admission Diagnoses:  Past Medical History:  Diagnosis Date   Anxiety    Arthritis    Atrial fibrillation (HCC)    Complication of anesthesia    used to get migraine headaches after surgery   Congestive heart failure (HCC)    COPD (chronic obstructive pulmonary disease) (HCC)    Dyspnea    if afib causes a problem   Pneumonia    Discharge Diagnoses:   Principal Problem:   Failed total knee arthroplasty (HCC) Active Problems:   Atrial fibrillation with rapid ventricular response (HCC)   COPD with acute exacerbation (HCC)   Status post revision of total knee replacement, right   Pressure injury of skin  Estimated body mass index is 39.77 kg/m as calculated from the following:   Height as of this encounter: 5\' 5"  (1.651 m).   Weight as of this encounter: 108.4 kg.  Procedure:  Procedure(s) (LRB): TOTAL KNEE REVISION (Right)   Consults: cardiology and pulmonary/intensive care  HPI: The patient is a 75 year old female who had a right total knee arthroplasty done approximately 20 years ago.  She presented to my office several months ago with pain and instability in the knee.  It was noted that she had probable  loosening in her knee, had developed a valgus alignment.  We obtained a bone scan, which did show loosening of the tibial component.  She presents now for right total knee arthroplasty revision.  Laboratory Data: Admission on 06/27/2021  Component Date Value Ref Range Status   SARS Coronavirus 2 06/27/2021 NEGATIVE  NEGATIVE Final   Comment: (NOTE) SARS-CoV-2 target nucleic acids are NOT DETECTED.  The SARS-CoV-2 RNA is generally detectable in upper and lower respiratory specimens during the acute phase of infection. The lowest concentration of SARS-CoV-2 viral copies this  assay can detect is 250 copies / mL. A negative result does not preclude SARS-CoV-2 infection and should not be used as the sole basis for treatment or other patient management decisions.  A negative result may occur with improper specimen collection / handling, submission of specimen other than nasopharyngeal swab, presence of viral mutation(s) within the areas targeted by this assay, and inadequate number of viral copies (<250 copies / mL). A negative result must be combined with clinical observations, patient history, and epidemiological information.  Fact Sheet for Patients:   06/29/2021  Fact Sheet for Healthcare Providers: BoilerBrush.com.cy  This test is not yet approved or                           cleared by the https://pope.com/ FDA and has been authorized for detection and/or diagnosis of SARS-CoV-2 by FDA under an Emergency Use Authorization (EUA).  This EUA will remain in effect (meaning this test can be used) for the duration of the COVID-19 declaration under Section 564(b)(1) of the Act, 21 U.S.C. section 360bbb-3(b)(1), unless the authorization is terminated or revoked sooner.  Performed at [facility], [address] [place], [place]    WBC 06/28/2021 14.0 (A)  4.0 - 10.5 K/uL Final   RBC 06/28/2021 4.10  3.87 - 5.11 MIL/uL Final   Hemoglobin 06/28/2021 13.4  12.0 - 15.0 g/dL Final   HCT 06/30/2021 42.3  36.0 - 46.0 % Final   MCV 06/28/2021  103.2 (A)  80.0 - 100.0 fL Final   MCH 06/28/2021 32.7  26.0 - 34.0 pg Final   MCHC 06/28/2021 31.7  30.0 - 36.0 g/dL Final   RDW 40/98/1191 13.4  11.5 - 15.5 % Final   Platelets 06/28/2021 186  150 - 400 K/uL Final   nRBC 06/28/2021 0.0  0.0 - 0.2 % Final   Performed at [facility], [address] [place], [place] [address]   Sodium 06/28/2021 136  135 - 145 mmol/L Final   Potassium 06/28/2021 4.5  3.5 - 5.1 mmol/L Final    Chloride 06/28/2021 102  98 - 111 mmol/L Final   CO2 06/28/2021 25  22 - 32 mmol/L Final   Glucose, Bld 06/28/2021 181 (A)  70 - 99 mg/dL Final   Glucose reference range applies only to samples taken after fasting for at least 8 hours.   BUN 06/28/2021 18  8 - 23 mg/dL Final   Creatinine, Ser 06/28/2021 1.03 (A)  0.44 - 1.00 mg/dL Final   Calcium 56/21/3086 8.7 (A)  8.9 - 10.3 mg/dL Final   GFR, Estimated 06/28/2021 57 (A)  >60 mL/min Final   Comment: (NOTE) Calculated using the CKD-EPI Creatinine Equation (2021)    Anion gap 06/28/2021 9  5 - 15 Final   Performed at [facility], [address] [place], [place] [address]   WBC 06/29/2021 19.3 (A)  4.0 - 10.5 K/uL Final   RBC 06/29/2021 3.78 (A)  3.87 - 5.11 MIL/uL Final   Hemoglobin 06/29/2021 12.7  12.0 - 15.0 g/dL Final   HCT 96/29/5284 38.1  36.0 - 46.0 % Final   MCV 06/29/2021 100.8 (A)  80.0 - 100.0 fL Final   MCH 06/29/2021 33.6  26.0 - 34.0 pg Final   MCHC 06/29/2021 33.3  30.0 - 36.0 g/dL Final   RDW 13/24/4010 13.6  11.5 - 15.5 % Final   Platelets 06/29/2021 170  150 - 400 K/uL Final   nRBC 06/29/2021 0.0  0.0 - 0.2 % Final   Performed at [facility], [address] [place], [place] [address]   Sodium 06/29/2021 135  135 - 145 mmol/L Final   Potassium 06/29/2021 4.4  3.5 - 5.1 mmol/L Final   Chloride 06/29/2021 99  98 - 111 mmol/L Final   CO2 06/29/2021 28  22 - 32 mmol/L Final   Glucose, Bld 06/29/2021 162 (A)  70 - 99 mg/dL Final   Glucose reference range applies only to samples taken after fasting for at least 8 hours.   BUN 06/29/2021 34 (A)  8 - 23 mg/dL Final   Creatinine, Ser 06/29/2021 1.26 (A)  0.44 - 1.00 mg/dL Final   Calcium 66/44/0347 9.0  8.9 - 10.3 mg/dL Final   GFR, Estimated 06/29/2021 45 (A)  >60 mL/min Final   Comment: (NOTE) Calculated using the CKD-EPI Creatinine Equation (2021)    Anion gap 06/29/2021 8  5 - 15 Final   Performed at [facility], [address] [place], [place] [address]   WBC 06/30/2021 16.6 (A)  4.0 - 10.5 K/uL Final   RBC 06/30/2021 3.71 (A)  3.87 - 5.11 MIL/uL Final   Hemoglobin 06/30/2021 12.3  12.0 - 15.0 g/dL Final   HCT 63/87/5643 39.0  36.0 - 46.0 % Final   MCV 06/30/2021 105.1 (A)  80.0 - 100.0 fL Final   MCH 06/30/2021 33.2  26.0 - 34.0 pg Final   MCHC 06/30/2021 31.5  30.0 - 36.0 g/dL Final   RDW 16/06/9603 13.7  11.5 - 15.5 % Final   Platelets 06/30/2021 169  150 - 400 K/uL Final   nRBC 06/30/2021 0.0  0.0 - 0.2 % Final   Performed at [facility], [address], [place], [place] [address]   Sodium 06/30/2021 136  135 - 145 mmol/L Final   Potassium 06/30/2021 4.8  3.5 - 5.1 mmol/L Final   Chloride 06/30/2021 95 (A)  98 - 111 mmol/L Final   CO2 06/30/2021 31  22 - 32 mmol/L Final   Glucose, Bld 06/30/2021 87  70 - 99 mg/dL Final   Glucose reference range applies only to samples taken after fasting for at least 8 hours.   BUN 06/30/2021 37 (A)  8 - 23 mg/dL Final   Creatinine, Ser 06/30/2021 1.28 (A)  0.44 - 1.00 mg/dL Final   Calcium 11/91/4782 8.9  8.9 - 10.3 mg/dL Final   GFR, Estimated 06/30/2021 44 (A)  >60 mL/min Final   Comment: (NOTE) Calculated using the CKD-EPI Creatinine Equation (2021)    Anion gap 06/30/2021 10  5 - 15 Final   Performed at [facility], [address] [place], [place] [address]   FIO2 06/30/2021 100.00   Final   pH, Arterial 06/30/2021 7.414  7.350 - 7.450 Final   pCO2 arterial 06/30/2021 48.4 (A)  32.0 - 48.0 mmHg Final   pO2, Arterial 06/30/2021 66.7 (A)  83.0 - 108.0 mmHg Final   Bicarbonate 06/30/2021 30.4 (A)  20.0 - 28.0 mmol/L Final   Acid-Base Excess 06/30/2021 5.3 (A)  0.0 - 2.0 mmol/L Final   O2 Saturation 06/30/2021 93.3  % Final   Patient temperature 06/30/2021 98.6   Final   Allens test (pass/fail) 06/30/2021 PASS  PASS Final   Performed at [facility], [address]  [place], [place] [address]   Sodium 07/01/2021 135  135 - 145 mmol/L Final   Potassium 07/01/2021 4.2  3.5 - 5.1 mmol/L Final   Chloride 07/01/2021 93 (A)  98 - 111 mmol/L Final   CO2 07/01/2021 33 (A)  22 - 32 mmol/L Final   Glucose, Bld 07/01/2021 184 (A)  70 - 99 mg/dL Final   Glucose reference range applies only to samples taken after fasting for at least 8 hours.   BUN 07/01/2021 39 (A)  8 - 23 mg/dL Final   Creatinine, Ser 07/01/2021 1.23 (A)  0.44 - 1.00 mg/dL Final   Calcium 78/46/9629 8.5 (A)  8.9 - 10.3 mg/dL Final   GFR, Estimated 07/01/2021 46 (A)  >60 mL/min Final   Comment: (NOTE) Calculated using the CKD-EPI Creatinine Equation (2021)    Anion gap 07/01/2021 9  5 - 15 Final   Performed at [facility], [address], [place], [place] [address]   Magnesium 07/01/2021 1.9  1.7 - 2.4 mg/dL Final   Performed at [facility], [address] [place], [place] [address]   Phosphorus 07/01/2021 4.2  2.5 - 4.6 mg/dL Final   Performed at [facility], [address], [place], [place] [address]   WBC 07/01/2021 13.0 (A)  4.0 - 10.5 K/uL Final   RBC 07/01/2021 3.81 (A)  3.87 - 5.11 MIL/uL Final   Hemoglobin 07/01/2021 12.5  12.0 - 15.0 g/dL Final   HCT 53/66/4403 39.1  36.0 - 46.0 % Final   MCV 07/01/2021 102.6 (A)  80.0 - 100.0 fL Final  MCH 07/01/2021 32.8  26.0 - 34.0 pg Final   MCHC 07/01/2021 32.0  30.0 - 36.0 g/dL Final   RDW 16/09/930 13.7  11.5 - 15.5 % Final   Platelets 07/01/2021 177  150 - 400 K/uL Final   nRBC 07/01/2021 0.0  0.0 - 0.2 % Final   Performed at [facility], [address], [place], [place] [address]   Sodium 07/02/2021 137  135 - 145 mmol/L Final   Potassium 07/02/2021 3.7  3.5 - 5.1 mmol/L Final   Chloride 07/02/2021 97 (A)  98 - 111 mmol/L Final   CO2 07/02/2021 35 (A)  22 - 32 mmol/L Final   Glucose, Bld 07/02/2021 125 (A)  70 - 99 mg/dL Final   Glucose reference range applies  only to samples taken after fasting for at least 8 hours.   BUN 07/02/2021 44 (A)  8 - 23 mg/dL Final   Creatinine, Ser 07/02/2021 1.12 (A)  0.44 - 1.00 mg/dL Final   Calcium 22/10/5425 8.5 (A)  8.9 - 10.3 mg/dL Final   GFR, Estimated 07/02/2021 52 (A)  >60 mL/min Final   Comment: (NOTE) Calculated using the CKD-EPI Creatinine Equation (2021)    Anion gap 07/02/2021 5  5 - 15 Final   Performed at [facility], [address], [place], [place] [address]   WBC 07/02/2021 17.9 (A)  4.0 - 10.5 K/uL Final   RBC 07/02/2021 3.71 (A)  3.87 - 5.11 MIL/uL Final   Hemoglobin 07/02/2021 12.2  12.0 - 15.0 g/dL Final   HCT 62/83/1517 38.1  36.0 - 46.0 % Final   MCV 07/02/2021 102.7 (A)  80.0 - 100.0 fL Final   MCH 07/02/2021 32.9  26.0 - 34.0 pg Final   MCHC 07/02/2021 32.0  30.0 - 36.0 g/dL Final   RDW 61/60/7371 13.8  11.5 - 15.5 % Final   Platelets 07/02/2021 191  150 - 400 K/uL Final   nRBC 07/02/2021 0.0  0.0 - 0.2 % Final   Performed at [facility], [address] [place], [place] [address]   Magnesium 07/02/2021 2.0  1.7 - 2.4 mg/dL Final   Performed at [facility], [address] [place], [place] [address]   WBC 07/03/2021 14.6 (A)  4.0 - 10.5 K/uL Final   RBC 07/03/2021 3.87  3.87 - 5.11 MIL/uL Final   Hemoglobin 07/03/2021 12.7  12.0 - 15.0 g/dL Final   HCT 27/11/5007 39.9  36.0 - 46.0 % Final   MCV 07/03/2021 103.1 (A)  80.0 - 100.0 fL Final   MCH 07/03/2021 32.8  26.0 - 34.0 pg Final   MCHC 07/03/2021 31.8  30.0 - 36.0 g/dL Final   RDW 38/18/2993 14.0  11.5 - 15.5 % Final   Platelets 07/03/2021 202  150 - 400 K/uL Final   nRBC 07/03/2021 0.0  0.0 - 0.2 % Final   Performed at [facility], [address], [place], [place] [address]   Sodium 07/03/2021 135  135 - 145 mmol/L Final   Potassium 07/03/2021 3.6  3.5 - 5.1 mmol/L Final   Chloride 07/03/2021 93 (A)  98 - 111 mmol/L Final   CO2 07/03/2021 34 (A)   22 - 32 mmol/L Final   Glucose, Bld 07/03/2021 137 (A)  70 - 99 mg/dL Final   Glucose reference range applies only to samples taken after fasting for at least 8 hours.   BUN 07/03/2021 36 (A)  8 - 23 mg/dL  Final   Creatinine, Ser 07/03/2021 0.93  0.44 - 1.00 mg/dL Final   Calcium 78/29/5621 8.5 (A)  8.9 - 10.3 mg/dL Final   GFR, Estimated 07/03/2021 >60  >60 mL/min Final   Comment: (NOTE) Calculated using the CKD-EPI Creatinine Equation (2021)    Anion gap 07/03/2021 8  5 - 15 Final   Performed at [facility], [address], [place], [place] [address]   SARS Coronavirus 2 07/05/2021 NEGATIVE  NEGATIVE Final   Comment: (NOTE) SARS-CoV-2 target nucleic acids are NOT DETECTED.  The SARS-CoV-2 RNA is generally detectable in upper and lower respiratory specimens during the acute phase of infection. Negative results do not preclude SARS-CoV-2 infection, do not rule out co-infections with other pathogens, and should not be used as the sole basis for treatment or other patient management decisions. Negative results must be combined with clinical observations, patient history, and epidemiological information. The expected result is Negative.  Fact Sheet for Patients: HairSlick.no  Fact Sheet for Healthcare Providers: quierodirigir.com  This test is not yet approved or cleared by the [place] FDA and  has been authorized for detection and/or diagnosis of SARS-CoV-2 by FDA under an Emergency Use Authorization (EUA). This EUA will remain  in effect (meaning this test can be used) for the duration of the COVID-19 declaration under Se                          ction 564(b)(1) of the Act, 21 U.S.C. section 360bbb-3(b)(1), unless the authorization is terminated or revoked sooner.  Performed at [facility], [address] [place], [place] [address]   Hospital Outpatient Visit on 06/20/2021  Component Date  Value Ref Range Status   MRSA, PCR 06/20/2021 NEGATIVE  NEGATIVE Final   Staphylococcus aureus 06/20/2021 NEGATIVE  NEGATIVE Final   Comment: (NOTE) The Xpert SA Assay (FDA approved for NASAL specimens in patients 65 years of age and older), is one component of a comprehensive surveillance program. It is not intended to diagnose infection nor to guide or monitor treatment. Performed at [facility], [address], [place], [place] [address]    WBC 06/20/2021 9.1  4.0 - 10.5 K/uL Final   RBC 06/20/2021 4.50  3.87 - 5.11 MIL/uL Final   Hemoglobin 06/20/2021 14.7  12.0 - 15.0 g/dL Final   HCT 84/13/2440 46.1 (A)  36.0 - 46.0 % Final   MCV 06/20/2021 102.4 (A)  80.0 - 100.0 fL Final   MCH 06/20/2021 32.7  26.0 - 34.0 pg Final   MCHC 06/20/2021 31.9  30.0 - 36.0 g/dL Final   RDW 07/06/2535 13.5  11.5 - 15.5 % Final   Platelets 06/20/2021 169  150 - 400 K/uL Final   nRBC 06/20/2021 0.0  0.0 - 0.2 % Final   Performed at [facility], [address] [place], [place] [address]   Sodium 06/20/2021 142  135 - 145 mmol/L Final   Potassium 06/20/2021 4.5  3.5 - 5.1 mmol/L Final   Chloride 06/20/2021 107  98 - 111 mmol/L Final   CO2 06/20/2021 26  22 - 32 mmol/L Final   Glucose, Bld 06/20/2021 102 (A)  70 - 99 mg/dL Final   Glucose reference range applies only to samples taken after fasting for at least 8 hours.   BUN 06/20/2021 14  8 - 23 mg/dL Final   Creatinine, Ser 06/20/2021 0.94  0.44 - 1.00 mg/dL Final  Calcium 06/20/2021 9.5  8.9 - 10.3 mg/dL Final   Total Protein 82/95/6213 7.5  6.5 - 8.1 g/dL Final   Albumin 08/65/7846 4.0  3.5 - 5.0 g/dL Final   AST 96/29/5284 16  15 - 41 U/L Final   ALT 06/20/2021 10  0 - 44 U/L Final   Alkaline Phosphatase 06/20/2021 66  38 - 126 U/L Final   Total Bilirubin 06/20/2021 0.9  0.3 - 1.2 mg/dL Final   GFR, Estimated 06/20/2021 >60  >60 mL/min Final   Comment: (NOTE) Calculated using the CKD-EPI Creatinine  Equation (2021)    Anion gap 06/20/2021 9  5 - 15 Final   Performed at [facility], [address], [place], [place] [address]     X-Rays:DG Chest 2 View  Result Date: 06/25/2021 CLINICAL DATA:  Preop for knee surgery.  COPD.  Atrial fibrillation. EXAM: CHEST - 2 VIEW COMPARISON:  02/01/2021 FINDINGS: Cervical spine fixation. Midline trachea. Mild cardiomegaly. Atherosclerosis in the transverse aorta. No pleural effusion or pneumothorax. Diffuse peribronchial thickening. No lobar consolidation. IMPRESSION: Cardiomegaly and COPD/chronic bronchitis. No acute superimposed process. Aortic Atherosclerosis (ICD10-I70.0). Electronically Signed   By: Jeronimo Greaves M.D.   On: 06/25/2021 13:55   DG Chest Port 1 View  Result Date: 06/30/2021 CLINICAL DATA:  Acute respiratory distress. Knee surgery 3 days ago. EXAM: PORTABLE CHEST 1 VIEW COMPARISON:  Chest radiograph 06/22/2021.  CT 10/04/2020 FINDINGS: Upper normal heart size, stable allowing for differences in technique. Unchanged mediastinal contours. Aortic atherosclerosis. Bandlike streaky opacity at both lung bases typical of atelectasis. Mild emphysema and peribronchial thickening. There is no significant pleural effusion. No visualized pneumothorax. Surgical hardware in the lower cervical spine. IMPRESSION: 1. Bandlike streaky opacity at both lung bases typical of atelectasis. 2. Mild emphysema and chronic peribronchial thickening. Electronically Signed   By: Narda Rutherford M.D.   On: 06/30/2021 17:34    EKG: Orders placed or performed during the hospital encounter of 06/27/21   EKG 12-Lead   EKG 12-Lead     Hospital Course: Naesha Buckalew is a 75 y.o. who was admitted to [facility]. They were brought to the operating room on 06/27/2021 and underwent Procedure(s): TOTAL KNEE REVISION.  Patient tolerated the procedure well and was later transferred to the recovery room and then to the orthopaedic floor for  postoperative care. They were given PO and IV analgesics for pain control following their surgery. They were given 24 hours of postoperative antibiotics of  Anti-infectives (From admission, onward)    Start     Dose/Rate Route Frequency Ordered Stop   06/27/21 2100  vancomycin (VANCOCIN) IVPB 1000 mg/200 mL premix       Note to Pharmacy: Please edit to match common surgical prophylaxis antibiotics for individuals who receive VANC preop   1,000 mg 200 mL/hr over 60 Minutes Intravenous Every 12 hours 06/27/21 1733 06/27/21 2134   06/27/21 1700  ceFAZolin (ANCEF) IVPB 2g/100 mL premix  Status:  Discontinued        2 g 200 mL/hr over 30 Minutes Intravenous Every 6 hours 06/27/21 1455 06/27/21 1733   06/27/21 0930  vancomycin (VANCOCIN) IVPB 1000 mg/200 mL premix        1,000 mg 200 mL/hr over 60 Minutes Intravenous  Once 06/27/21 0925 06/27/21 1201      and started on DVT prophylaxis in the form of Xarelto and TED hose.   PT and OT were ordered for total joint protocol. Discharge planning consulted to  help with postop disposition and equipment needs. Patient struggled to meet goals with PT during the entirety of her hospital stay, but also had episode of atrial fibrillation while inpatient which led to cardiology and hospitalist consult and has been followed by each team respectively. Patient had acute COPD exacerbation and was recommended bronchodilators and was changed to Xopenex due to tachycardia. On cardizem and Xarelto due to permanent atrial fibrillation with RVR. Due to coexisting issues from pulmonary and cardiology standpoint, as well as inability to meet goals with PT regarding her knee, recommend SNF placement, and patient transferred to SNF on POD#9   Diet: Regular diet Activity: WBAT Follow-up: in two weeks Disposition: Skilled nursing facility Discharged Condition: fair   Discharge Instructions     Call MD / Call 911   Complete by: As directed    If you experience chest pain  or shortness of breath, CALL 911 and be transported to the hospital emergency room.  If you develope a fever above 101 F, pus (white drainage) or increased drainage or redness at the wound, or calf pain, call your surgeon's office.   Call MD / Call 911   Complete by: As directed    If you experience chest pain or shortness of breath, CALL 911 and be transported to the hospital emergency room.  If you develope a fever above 101 F, pus (white drainage) or increased drainage or redness at the wound, or calf pain, call your surgeon's office.   Call MD / Call 911   Complete by: As directed    If you experience chest pain or shortness of breath, CALL 911 and be transported to the hospital emergency room.  If you develope a fever above 101 F, pus (white drainage) or increased drainage or redness at the wound, or calf pain, call your surgeon's office.   Change dressing   Complete by: As directed    You may remove the bulky bandage (ACE wrap and gauze) two days after surgery. You will have an adhesive waterproof bandage underneath. Leave this in place until your first follow-up appointment.   Change dressing   Complete by: As directed    You may remove the bulky bandage (ACE wrap and gauze) two days after surgery. You will have an adhesive waterproof bandage underneath. Leave this in place until your first follow-up appointment.   Change dressing   Complete by: As directed    You may remove the bulky bandage (ACE wrap and gauze) two days after surgery. You will have an adhesive waterproof bandage underneath. Leave this in place until your first follow-up appointment.   Constipation Prevention   Complete by: As directed    Drink plenty of fluids.  Prune juice may be helpful.  You may use a stool softener, such as Colace (over the counter) 100 mg twice a day.  Use MiraLax (over the counter) for constipation as needed.   Constipation Prevention   Complete by: As directed    Drink plenty of fluids.  Prune  juice may be helpful.  You may use a stool softener, such as Colace (over the counter) 100 mg twice a day.  Use MiraLax (over the counter) for constipation as needed.   Constipation Prevention   Complete by: As directed    Drink plenty of fluids.  Prune juice may be helpful.  You may use a stool softener, such as Colace (over the counter) 100 mg twice a day.  Use MiraLax (over the counter) for constipation as  needed.   Diet - low sodium heart healthy   Complete by: As directed    Diet - low sodium heart healthy   Complete by: As directed    Diet - low sodium heart healthy   Complete by: As directed    Do not put a pillow under the knee. Place it under the heel.   Complete by: As directed    Do not put a pillow under the knee. Place it under the heel.   Complete by: As directed    Do not put a pillow under the knee. Place it under the heel.   Complete by: As directed    Driving restrictions   Complete by: As directed    No driving for two weeks   Driving restrictions   Complete by: As directed    No driving for two weeks   Driving restrictions   Complete by: As directed    No driving for two weeks   Post-operative opioid taper instructions:   Complete by: As directed    POST-OPERATIVE OPIOID TAPER INSTRUCTIONS: It is important to wean off of your opioid medication as soon as possible. If you do not need pain medication after your surgery it is ok to stop day one. Opioids include: Codeine, Hydrocodone(Norco, Vicodin), Oxycodone(Percocet, oxycontin) and hydromorphone amongst others.  Long term and even short term use of opiods can cause: Increased pain response Dependence Constipation Depression Respiratory depression And more.  Withdrawal symptoms can include Flu like symptoms Nausea, vomiting And more Techniques to manage these symptoms Hydrate well Eat regular healthy meals Stay active Use relaxation techniques(deep breathing, meditating, yoga) Do Not substitute Alcohol  to help with tapering If you have been on opioids for less than two weeks and do not have pain than it is ok to stop all together.  Plan to wean off of opioids This plan should start within one week post op of your joint replacement. Maintain the same interval or time between taking each dose and first decrease the dose.  Cut the total daily intake of opioids by one tablet each day Next start to increase the time between doses. The last dose that should be eliminated is the evening dose.      Post-operative opioid taper instructions:   Complete by: As directed    POST-OPERATIVE OPIOID TAPER INSTRUCTIONS: It is important to wean off of your opioid medication as soon as possible. If you do not need pain medication after your surgery it is ok to stop day one. Opioids include: Codeine, Hydrocodone(Norco, Vicodin), Oxycodone(Percocet, oxycontin) and hydromorphone amongst others.  Long term and even short term use of opiods can cause: Increased pain response Dependence Constipation Depression Respiratory depression And more.  Withdrawal symptoms can include Flu like symptoms Nausea, vomiting And more Techniques to manage these symptoms Hydrate well Eat regular healthy meals Stay active Use relaxation techniques(deep breathing, meditating, yoga) Do Not substitute Alcohol to help with tapering If you have been on opioids for less than two weeks and do not have pain than it is ok to stop all together.  Plan to wean off of opioids This plan should start within one week post op of your joint replacement. Maintain the same interval or time between taking each dose and first decrease the dose.  Cut the total daily intake of opioids by one tablet each day Next start to increase the time between doses. The last dose that should be eliminated is the evening dose.  Post-operative opioid taper instructions:   Complete by: As directed    POST-OPERATIVE OPIOID TAPER INSTRUCTIONS: It is  important to wean off of your opioid medication as soon as possible. If you do not need pain medication after your surgery it is ok to stop day one. Opioids include: Codeine, Hydrocodone(Norco, Vicodin), Oxycodone(Percocet, oxycontin) and hydromorphone amongst others.  Long term and even short term use of opiods can cause: Increased pain response Dependence Constipation Depression Respiratory depression And more.  Withdrawal symptoms can include Flu like symptoms Nausea, vomiting And more Techniques to manage these symptoms Hydrate well Eat regular healthy meals Stay active Use relaxation techniques(deep breathing, meditating, yoga) Do Not substitute Alcohol to help with tapering If you have been on opioids for less than two weeks and do not have pain than it is ok to stop all together.  Plan to wean off of opioids This plan should start within one week post op of your joint replacement. Maintain the same interval or time between taking each dose and first decrease the dose.  Cut the total daily intake of opioids by one tablet each day Next start to increase the time between doses. The last dose that should be eliminated is the evening dose.      TED hose   Complete by: As directed    Use stockings (TED hose) for three weeks on both leg(s).  You may remove them at night for sleeping.   TED hose   Complete by: As directed    Use stockings (TED hose) for three weeks on both leg(s).  You may remove them at night for sleeping.   TED hose   Complete by: As directed    Use stockings (TED hose) for three weeks on both leg(s).  You may remove them at night for sleeping.   Weight bearing as tolerated   Complete by: As directed    Weight bearing as tolerated   Complete by: As directed    Weight bearing as tolerated   Complete by: As directed       Allergies as of 07/06/2021       Reactions   Penicillins    ++ Pt stated that she can take keflex++ Did it involve swelling of  the face/tongue/throat, SOB, or low BP? Yes Did it involve sudden or severe rash/hives, skin peeling, or any reaction on the inside of your mouth or nose? N Did you need to seek medical attention at a hospital or doctor's office? Y When did it last happen? childhood      If all above answers are "NO", may proceed with cephalosporin use.        Medication List     TAKE these medications    albuterol 108 (90 Base) MCG/ACT inhaler Commonly known as: VENTOLIN HFA Inhale 2 puffs into the lungs every 6 (six) hours as needed for wheezing or shortness of breath.   diltiazem 180 MG 24 hr capsule Commonly known as: CARDIZEM CD Take 1 capsule (180 mg total) by mouth daily.   furosemide 40 MG tablet Commonly known as: LASIX Take 1 tablet (40 mg total) by mouth daily.   loratadine 10 MG tablet Commonly known as: CLARITIN Take 10 mg by mouth daily as needed for allergies.   methocarbamol 500 MG tablet Commonly known as: ROBAXIN Take 1 tablet (500 mg total) by mouth every 6 (six) hours as needed for muscle spasms.   oxyCODONE 5 MG immediate release tablet Commonly known as: Oxy IR/ROXICODONE Take  1-2 tablets (5-10 mg total) by mouth every 6 (six) hours as needed for severe pain.   rivaroxaban 20 MG Tabs tablet Commonly known as: Xarelto Take 1 tablet (20 mg total) by mouth daily with supper. What changed: Another medication with the same name was added. Make sure you understand how and when to take each.   rivaroxaban 20 MG Tabs tablet Commonly known as: XARELTO Take 1 tablet (20 mg total) by mouth daily with supper. What changed: You were already taking a medication with the same name, and this prescription was added. Make sure you understand how and when to take each.   traMADol 50 MG tablet Commonly known as: ULTRAM Take 1-2 tablets (50-100 mg total) by mouth every 6 (six) hours as needed for moderate pain.               Durable Medical Equipment  (From admission,  onward)           Start     Ordered   06/30/21 1427  For home use only DME Bedside commode  Once       Question:  Patient needs a bedside commode to treat with the following condition  Answer:  S/P revision of total knee, right   06/30/21 1426              Discharge Care Instructions  (From admission, onward)           Start     Ordered   07/06/21 0000  Weight bearing as tolerated        07/06/21 0905   07/06/21 0000  Change dressing       Comments: You may remove the bulky bandage (ACE wrap and gauze) two days after surgery. You will have an adhesive waterproof bandage underneath. Leave this in place until your first follow-up appointment.   07/06/21 0905   07/04/21 0000  Weight bearing as tolerated        07/04/21 1147   07/04/21 0000  Change dressing       Comments: You may remove the bulky bandage (ACE wrap and gauze) two days after surgery. You will have an adhesive waterproof bandage underneath. Leave this in place until your first follow-up appointment.   07/04/21 1147   06/28/21 0000  Weight bearing as tolerated        06/28/21 0755   06/28/21 0000  Change dressing       Comments: You may remove the bulky bandage (ACE wrap and gauze) two days after surgery. You will have an adhesive waterproof bandage underneath. Leave this in place until your first follow-up appointment.   06/28/21 0755            Contact information for follow-up providers     Ollen Gross, MD Follow up in 2 week(s).   Specialty: Orthopedic Surgery Contact information: [address] [place] [address] [phone]              Contact information for after-discharge care     Destination     [facility] .   Service: Skilled Nursing Contact information: [address] [place] [address] [phone]                     Signed: Nelia Shi, MBA, PA-C Orthopedic Surgery 07/06/2021,  9:55 AM

## 2021-07-06 NOTE — Progress Notes (Signed)
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  Patient does have baseline shortness of breath and is on 4 L of oxygen at home though she is not quite compliant with it.  Medical team was consulted due to increasing oxygen demand postoperatively. Patient was also noted to have atrial fibrillation with RVR so hospitalist team was consulted for further evaluation and treatment.  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  Currently on nasal cannula.  Currently on her baseline oxygen demand at 4 to 6 L..  Feels better.  Was encouraged on deep breathing and incentive spirometry.    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  Management as per primary team.    Permanent atrial fibrillation with RVR-Improved after pain management.  Mild tachycardia likely secondary to albuterol.  Continue Cardizem and Xarelto.  On Xopenex at this time   Acute COPD exacerbation-Patient is on home oxygen at 4 L/min but was not really compliant at home.  Continue bronchodilators.  Changed to Xopenex due to tachycardia.  Improved no active wheezing.   Chronic diastolic congestive heart failure -continue oral Lasix  Scheduled Meds:  albuterol  2.5 mg Nebulization BID   Chlorhexidine Gluconate Cloth  6 each Topical Daily   diltiazem  180 mg  Oral Daily   docusate sodium  100 mg Oral BID   furosemide  40 mg Oral Daily   loratadine  10 mg Oral Daily   mouth rinse  15 mL Mouth Rinse BID   rivaroxaban  20 mg Oral Q breakfast   Continuous Infusions:  methocarbamol (ROBAXIN) IV     PRN Meds:.acetaminophen, albuterol, bisacodyl, diltiazem, diphenhydrAMINE, guaiFENesin, levalbuterol, menthol-cetylpyridinium **OR** phenol, methocarbamol **OR** methocarbamol (ROBAXIN) IV, metoCLOPramide **OR** metoCLOPramide (REGLAN) injection, morphine injection, ondansetron **OR** ondansetron (ZOFRAN) IV, oxyCODONE, polyethylene glycol, sodium chloride, sodium phosphate, traMADol  Diet Orders (From admission, onward)     Start     Ordered   07/04/21 0000  Diet - low sodium heart healthy        07/04/21 1147   06/28/21 0000  Diet - low sodium heart healthy        06/28/21 0755   06/27/21 1457  Diet regular Room service appropriate? Yes; Fluid consistency: Thin  Diet effective now       Question Answer Comment  Room service appropriate? Yes   Fluid consistency: Thin      06/27/21 1456            DVT prophylaxis: rivaroxaban (XARELTO) tablet 20 mg Start: 07/01/21 0800 SCDs Start: 06/27/21 1456 Place TED hose Start: 06/27/21 1456 rivaroxaban (XARELTO) tablet 20 mg     Code Status: Full Code  Family Communication: Family present at bedside  Status is: Inpatient  Level of care: Med-Surg  Microbiology  none  Antimicrobials: none   Objective: Vitals:   07/05/21 2003 07/05/21 2104 07/06/21 0153 07/06/21 8119  BP:  111/84 120/73 (!) 168/79  Pulse:  96 100 81  Resp:  18 18 18   Temp:  98.5 F (36.9 C) 97.8 F (36.6 C) 98 F (36.7 C)  TempSrc:  Oral Oral Oral  SpO2: 92% 90% 93% 97%  Weight:      Height:        Intake/Output Summary (Last 24 hours) at 07/06/2021 0713 Last data filed at 07/06/2021 0400 Gross per 24 hour  Intake 1440 ml  Output 1400 ml  Net 40 ml    Filed Weights   06/27/21 0944  Weight: 108.4 kg     Examination:  Constitutional: NAD Respiratory: Clear bilaterally, no wheezing Cardiovascular: RRR  Data Reviewed: I have independently reviewed following labs and imaging studies   CBC: Recent Labs  Lab 06/30/21 0326 07/01/21 0249 07/02/21 0248 07/03/21 0238  WBC 16.6* 13.0* 17.9* 14.6*  HGB 12.3 12.5 12.2 12.7  HCT 39.0 39.1 38.1 39.9  MCV 105.1* 102.6* 102.7* 103.1*  PLT 169 177 191 202    Basic Metabolic Panel: Recent Labs  Lab 06/30/21 1811 07/01/21 0249 07/02/21 0248 07/03/21 0238  NA 136 135 137 135  K 4.8 4.2 3.7 3.6  CL 95* 93* 97* 93*  CO2 31 33* 35* 34*  GLUCOSE 87 184* 125* 137*  BUN 37* 39* 44* 36*  CREATININE 1.28* 1.23* 1.12* 0.93  CALCIUM 8.9 8.5* 8.5* 8.5*  MG  --  1.9 2.0  --   PHOS  --  4.2  --   --     Liver Function Tests: No results for input(s): AST, ALT, ALKPHOS, BILITOT, PROT, ALBUMIN in the last 168 hours. Coagulation Profile: No results for input(s): INR, PROTIME in the last 168 hours. HbA1C: No results for input(s): HGBA1C in the last 72 hours. CBG: No results for input(s): GLUCAP in the last 168 hours.  Recent Results (from the past 240 hour(s))  SARS Coronavirus 2 by RT PCR (hospital order, performed in [facility] hospital lab) Nasopharyngeal Nasopharyngeal Swab     Status: None   Collection Time: 06/27/21  9:26 AM   Specimen: Nasopharyngeal Swab  Result Value Ref Range Status   SARS Coronavirus 2 NEGATIVE NEGATIVE Final    Comment: (NOTE) SARS-CoV-2 target nucleic acids are NOT DETECTED.  The SARS-CoV-2 RNA is generally detectable in upper and lower respiratory specimens during the acute phase of infection. The lowest concentration of SARS-CoV-2 viral copies this assay can detect is 250 copies / mL. A negative result does not preclude SARS-CoV-2 infection and should not be used as the sole basis for treatment or other patient management decisions.  A negative result may occur with improper specimen collection /  handling, submission of specimen other than nasopharyngeal swab, presence of viral mutation(s) within the areas targeted by this assay, and inadequate number of viral copies (<250 copies / mL). A negative result must be combined with clinical observations, patient history, and epidemiological information.  Fact Sheet for Patients:   06/29/21  Fact Sheet for Healthcare Providers: BoilerBrush.com.cy  This test is not yet approved or  cleared by the https://pope.com/ FDA and has been authorized for detection and/or diagnosis of SARS-CoV-2 by FDA under an Emergency Use Authorization (EUA).  This EUA will remain in effect (meaning this test can be used) for the duration of the COVID-19 declaration under Section 564(b)(1) of the Act, 21 U.S.C. section 360bbb-3(b)(1), unless the authorization is terminated or revoked sooner.  Performed at [facility], [address] [place], [place] [address]   SARS CORONAVIRUS 2 (TAT 6-24 HRS) Nasopharyngeal Nasopharyngeal Swab     Status: None   Collection Time: 07/05/21  2:24 PM   Specimen: Nasopharyngeal Swab  Result Value Ref Range Status   SARS Coronavirus 2 NEGATIVE NEGATIVE Final    Comment: (NOTE) SARS-CoV-2 target nucleic acids are NOT DETECTED.  The SARS-CoV-2 RNA is generally detectable in upper and lower respiratory specimens during the acute phase of infection. Negative results do not preclude SARS-CoV-2 infection, do not rule out co-infections with other pathogens, and should not be used as the sole basis for treatment or other patient management decisions. Negative results must be combined with clinical observations, patient history, and epidemiological information. The expected result is Negative.  Fact Sheet for Patients: HairSlick.no  Fact Sheet for Healthcare Providers: quierodirigir.com  This test is  not yet approved or cleared by the [place] FDA and  has been authorized for detection and/or diagnosis of SARS-CoV-2 by FDA under an Emergency Use Authorization (EUA). This EUA will remain  in effect (meaning this test can be used) for the duration of the COVID-19 declaration under Se ction 564(b)(1) of the Act, 21 U.S.C. section 360bbb-3(b)(1), unless the authorization is terminated or revoked sooner.  Performed at [facility], [address] [place], [place] [address]       Radiology Studies: No results found.   Pamella Pert, MD, PhD [facility]  Between 7 am - 7 pm I am available, please contact me via Amion (for emergencies) or Securechat (non urgent messages)  Between 7 pm - 7 am I am not available, please contact night coverage MD/APP via Amion

## 2021-07-12 ENCOUNTER — Other Ambulatory Visit (HOSPITAL_COMMUNITY): Payer: Medicare HMO

## 2021-07-20 ENCOUNTER — Ambulatory Visit: Payer: Medicare HMO | Admitting: Medical

## 2021-07-20 NOTE — Progress Notes (Deleted)
Cardiology Office Note   Date:  07/20/2021   ID:  Sherri Hart [place], DOB 05-Jun-1946, MRN [id number]  PCP:  Roselee Nova, MD  Cardiologist:  Pixie Casino, MD EP: None  No chief complaint on file.     History of Present Illness: Sherri Hart is a 75 y.o. female with PMH of chronic atrial fibrillation, chronic diastolic CHF, HTN, COPD on home O2*** who presents for ***  She was last seen by cardiology and an outpatient visit with Almyra Deforest, PA-C 04/2021 at which time she was referred to pulmonology for management of her COPD after recently relocating to [facility] a few months prior.  Otherwise she was doing well from a cardiac standpoint with stable heart rates and volume status.  She was transitioned from Eliquis to Xarelto due to cost of medication.  Since her last visit she underwent right TKA revision 06/2021 with postop course complicated by COPD exacerbation and atrial fibrillation with RVR.  She was discharged to [facility] 07/06/2021.  Her last echocardiogram 06/2020 showed EF 55-60%, mild concentric LVH, indeterminate LV diastolic function, no R WMA, mildly reduced RV systolic function, severe biatrial enlargement, and no significant valvular dysfunction.  1. Chronic atrial fibrillation: HR *** today. No complaints of bleeding on xarelto -Continue diltiazem for rate control Continue Xarelto for stroke Ppx  2.  Chronic diastolic CHF: Volume status*** -Lasix 40 mg daily -Continue to limit salt intake  3. HTN: BP *** today - Continue diltiazem and lasix  4. COPD: Referred to pulmonology at last visit does not appear to have established care -Encouraged her to follow-up with pulmonology    Past Medical History:  Diagnosis Date   Anxiety    Arthritis    Atrial fibrillation ([place])    Complication of anesthesia    used to get migraine headaches after surgery   Congestive heart failure ([place])    COPD (chronic obstructive pulmonary disease) ([place])    Dyspnea     if afib causes a problem   Pneumonia     Past Surgical History:  Procedure Laterality Date   BACK SURGERY     CHOLECYSTECTOMY     NECK SURGERY     REPLACEMENT TOTAL KNEE Right    TOTAL KNEE REVISION Right 06/27/2021   Procedure: TOTAL KNEE REVISION;  Surgeon: Gaynelle Arabian, MD;  Location: [facility];  Service: Orthopedics;  Laterality: Right;   TUBAL LIGATION       Current Outpatient Medications  Medication Sig Dispense Refill   albuterol (VENTOLIN HFA) 108 (90 Base) MCG/ACT inhaler Inhale 2 puffs into the lungs every 6 (six) hours as needed for wheezing or shortness of breath. 8 g 2   diltiazem (CARDIZEM CD) 180 MG 24 hr capsule Take 1 capsule (180 mg total) by mouth daily. 90 each 1   furosemide (LASIX) 40 MG tablet Take 1 tablet (40 mg total) by mouth daily. 90 tablet 1   loratadine (CLARITIN) 10 MG tablet Take 10 mg by mouth daily as needed for allergies.     methocarbamol (ROBAXIN) 500 MG tablet Take 1 tablet (500 mg total) by mouth every 6 (six) hours as needed for muscle spasms. 40 tablet 0   oxyCODONE (OXY IR/ROXICODONE) 5 MG immediate release tablet Take 1-2 tablets (5-10 mg total) by mouth every 6 (six) hours as needed for severe pain. 42 tablet 0   rivaroxaban (XARELTO) 20 MG TABS tablet Take 1 tablet (20 mg total) by mouth daily with supper. [place]  tablet 1   rivaroxaban (XARELTO) 20 MG TABS tablet Take 1 tablet (20 mg total) by mouth daily with supper. 30 tablet 0   traMADol (ULTRAM) 50 MG tablet Take 1-2 tablets (50-100 mg total) by mouth every 6 (six) hours as needed for moderate pain. 40 tablet 0   No current facility-administered medications for this visit.    Allergies:   Penicillins    Social History:  The patient  reports that she has been smoking cigarettes. She has a 26.50 pack-year smoking history. She has never used smokeless tobacco. She reports that she does not drink alcohol and does not use drugs.   Family History:  The patient's ***family history includes  COPD in an other family member.    ROS:  Please see the history of present illness.   Otherwise, review of systems are positive for {NONE DEFAULTED:18576}.   All other systems are reviewed and negative.    PHYSICAL EXAM: VS:  There were no vitals taken for this visit. , BMI There is no height or weight on file to calculate BMI. GEN: Well nourished, well developed, in no acute distress HEENT: normal Neck: no JVD, carotid bruits, or masses Cardiac: ***RRR; no murmurs, rubs, or gallops,no edema  Respiratory:  clear to auscultation bilaterally, normal work of breathing GI: soft, nontender, nondistended, + BS MS: no deformity or atrophy Skin: warm and dry, no rash Neuro:  Strength and sensation are intact Psych: euthymic mood, full affect   EKG:  EKG {ACTION; IS/IS [id number] ordered today. The ekg ordered today demonstrates ***   Recent Labs: 02/01/2021: B Natriuretic Peptide 258.1 06/20/2021: ALT 10 07/02/2021: Magnesium 2.0 07/03/2021: BUN 36; Creatinine, Ser 0.93; Hemoglobin 12.7; Platelets 202; Potassium 3.6; Sodium 135    Lipid Panel No results found for: CHOL, TRIG, HDL, CHOLHDL, VLDL, LDLCALC, LDLDIRECT    Wt Readings from Last 3 Encounters:  06/27/21 239 lb (108.4 kg)  06/20/21 239 lb (108.4 kg)  04/19/21 241 lb 9.6 oz (109.6 kg)      Other studies Reviewed: Additional studies/ records that were reviewed today include:   Echocardiogram 06/2020: 1. Left ventricular ejection fraction, by estimation, is 55 to 60%. The  left ventricle has normal function. The left ventricle has no regional  wall motion abnormalities. There is mild concentric left ventricular  hypertrophy. Left ventricular diastolic  function could not be evaluated.   2. Right ventricular systolic function is mildly reduced. The right  ventricular size is mildly enlarged. There is mildly elevated pulmonary  artery systolic pressure. The estimated right ventricular systolic  pressure is 0000000  mmHg.   3. Left atrial size was severely dilated.   4. Right atrial size was severely dilated.   5. A small pericardial effusion is present.   6. The mitral valve is normal in structure. Trivial mitral valve  regurgitation. No evidence of mitral stenosis.   7. The aortic valve is tricuspid. There is mild calcification of the  aortic valve. Aortic valve regurgitation is not visualized. Mild aortic  valve sclerosis is present, with no evidence of aortic valve stenosis.   8. The inferior vena cava is dilated in size with >50% respiratory  variability, suggesting right atrial pressure of 8 mmHg.   Comparison(s): No prior Echocardiogram.   Conclusion(s)/Recommendation(s): Normal LVEF with severe biatrial  enlargement, mildly elevated RVSP/mildly decreased RV function, no  significant valve disease.     ASSESSMENT AND PLAN:  1.  ***   Current medicines are reviewed at length with the  patient today.  The patient {ACTIONS; HAS/DOES NOT HAVE:19233} concerns regarding medicines.  The following changes have been made:  {PLAN; NO CHANGE:13088:s}  Labs/ tests ordered today include: *** No orders of the defined types were placed in this encounter.    Disposition:   FU with *** in {gen number [id number]} {Days to years:10300}  Signed, Beatriz Stallion, PA-C  07/20/2021 5:33 AM

## 2021-07-28 ENCOUNTER — Other Ambulatory Visit: Payer: Self-pay | Admitting: Physician Assistant

## 2021-07-29 ENCOUNTER — Other Ambulatory Visit: Payer: Self-pay | Admitting: Physician Assistant

## 2021-07-30 NOTE — Telephone Encounter (Signed)
Prescription refill request for Xarelto received.  Indication:Afib Last office visit:8/22 Weight:108.4 kg Age:75 Scr:0.9 CrCl:95.27 ml/min  Prescription refilled

## 2021-09-04 ENCOUNTER — Emergency Department (HOSPITAL_COMMUNITY): Payer: Medicare HMO

## 2021-09-04 ENCOUNTER — Emergency Department (HOSPITAL_COMMUNITY)
Admission: EM | Admit: 2021-09-04 | Discharge: 2021-09-04 | Disposition: A | Discharge status: Home / Self Care | Payer: Medicare HMO | Attending: Emergency Medicine | Admitting: Emergency Medicine

## 2021-09-04 ENCOUNTER — Other Ambulatory Visit: Payer: Self-pay

## 2021-09-04 DIAGNOSIS — I5031 Acute diastolic (congestive) heart failure: Secondary | ICD-10-CM | POA: Insufficient documentation

## 2021-09-04 DIAGNOSIS — Z7901 Long term (current) use of anticoagulants: Secondary | ICD-10-CM | POA: Insufficient documentation

## 2021-09-04 DIAGNOSIS — R0602 Shortness of breath: Secondary | ICD-10-CM | POA: Diagnosis present

## 2021-09-04 DIAGNOSIS — Z96651 Presence of right artificial knee joint: Secondary | ICD-10-CM | POA: Diagnosis not present

## 2021-09-04 DIAGNOSIS — J441 Chronic obstructive pulmonary disease with (acute) exacerbation: Secondary | ICD-10-CM | POA: Insufficient documentation

## 2021-09-04 DIAGNOSIS — R600 Localized edema: Secondary | ICD-10-CM | POA: Diagnosis not present

## 2021-09-04 DIAGNOSIS — I4891 Unspecified atrial fibrillation: Secondary | ICD-10-CM | POA: Insufficient documentation

## 2021-09-04 DIAGNOSIS — Z20822 Contact with and (suspected) exposure to covid-19: Secondary | ICD-10-CM | POA: Insufficient documentation

## 2021-09-04 DIAGNOSIS — F17210 Nicotine dependence, cigarettes, uncomplicated: Secondary | ICD-10-CM | POA: Insufficient documentation

## 2021-09-04 DIAGNOSIS — Z7951 Long term (current) use of inhaled steroids: Secondary | ICD-10-CM | POA: Insufficient documentation

## 2021-09-04 LAB — CBC WITH DIFFERENTIAL/PLATELET
Abs Immature Granulocytes: 0.01 10*3/uL (ref 0.00–0.07)
Basophils Absolute: 0 10*3/uL (ref 0.0–0.1)
Basophils Relative: 1 %
Eosinophils Absolute: 0.1 10*3/uL (ref 0.0–0.5)
Eosinophils Relative: 1 %
HCT: 49.5 % — ABNORMAL HIGH (ref 36.0–46.0)
Hemoglobin: 15.2 g/dL — ABNORMAL HIGH (ref 12.0–15.0)
Immature Granulocytes: 0 %
Lymphocytes Relative: 24 %
Lymphs Abs: 1.5 10*3/uL (ref 0.7–4.0)
MCH: 30.5 pg (ref 26.0–34.0)
MCHC: 30.7 g/dL (ref 30.0–36.0)
MCV: 99.4 fL (ref 80.0–100.0)
Monocytes Absolute: 0.6 10*3/uL (ref 0.1–1.0)
Monocytes Relative: 10 %
Neutro Abs: 4 10*3/uL (ref 1.7–7.7)
Neutrophils Relative %: 64 %
Platelets: 202 10*3/uL (ref 150–400)
RBC: 4.98 MIL/uL (ref 3.87–5.11)
RDW: 15.6 % — ABNORMAL HIGH (ref 11.5–15.5)
WBC: 6.2 10*3/uL (ref 4.0–10.5)
nRBC: 0 % (ref 0.0–0.2)

## 2021-09-04 LAB — COMPREHENSIVE METABOLIC PANEL
ALT: 13 U/L (ref 0–44)
AST: 17 U/L (ref 15–41)
Albumin: 3.7 g/dL (ref 3.5–5.0)
Alkaline Phosphatase: 101 U/L (ref 38–126)
Anion gap: 12 (ref 5–15)
BUN: 9 mg/dL (ref 8–23)
CO2: 23 mmol/L (ref 22–32)
Calcium: 8.8 mg/dL — ABNORMAL LOW (ref 8.9–10.3)
Chloride: 103 mmol/L (ref 98–111)
Creatinine, Ser: 0.97 mg/dL (ref 0.44–1.00)
GFR, Estimated: >60 mL/min (ref >60)
Glucose, Bld: 94 mg/dL (ref 70–99)
Potassium: 3.9 mmol/L (ref 3.5–5.1)
Sodium: 138 mmol/L (ref 135–145)
Total Bilirubin: 0.9 mg/dL (ref 0.3–1.2)
Total Protein: 6.6 g/dL (ref 6.5–8.1)

## 2021-09-04 LAB — RESP PANEL BY RT-PCR (FLU A&B, COVID) ARPGX2
Influenza A by PCR: NEGATIVE
Influenza B by PCR: NEGATIVE
SARS Coronavirus 2 by RT PCR: NEGATIVE

## 2021-09-04 LAB — TROPONIN I (HIGH SENSITIVITY)
Troponin I (High Sensitivity): 11 ng/L (ref <18)
Troponin I (High Sensitivity): 10 ng/L (ref <18)

## 2021-09-04 LAB — BRAIN NATRIURETIC PEPTIDE: B Natriuretic Peptide: 282.5 pg/mL — ABNORMAL HIGH (ref 0.0–100.0)

## 2021-09-04 MED ORDER — OXYCODONE HCL 5 MG PO TABS
10.0000 mg | ORAL_TABLET | Freq: Every day | ORAL | Status: DC
  Administered 2021-09-04: 18:00:00 10 mg via ORAL
  Filled 2021-09-04: qty 2

## 2021-09-04 MED ORDER — OXYCODONE HCL 5 MG PO TABS
10.0000 mg | ORAL_TABLET | Freq: Once | ORAL | Status: AC
  Administered 2021-09-04: 13:00:00 10 mg via ORAL
  Filled 2021-09-04: qty 2

## 2021-09-04 MED ORDER — TIOTROPIUM BROMIDE MONOHYDRATE 18 MCG IN CAPS: 18.0000 ug | ORAL_CAPSULE | Freq: Every day | RESPIRATORY_TRACT | 0 refills | Status: DC

## 2021-09-04 MED ORDER — IPRATROPIUM-ALBUTEROL 0.5-2.5 (3) MG/3ML IN SOLN
3.0000 mL | Freq: Once | RESPIRATORY_TRACT | Status: AC
  Administered 2021-09-04: 13:00:00 3 mL via RESPIRATORY_TRACT
  Filled 2021-09-04: qty 3

## 2021-09-04 MED ORDER — DOXYCYCLINE HYCLATE 100 MG PO TABS
100.0000 mg | ORAL_TABLET | Freq: Once | ORAL | Status: AC
  Administered 2021-09-04: 18:00:00 100 mg via ORAL
  Filled 2021-09-04: qty 1

## 2021-09-04 MED ORDER — IPRATROPIUM-ALBUTEROL 0.5-2.5 (3) MG/3ML IN SOLN
3.0000 mL | Freq: Once | RESPIRATORY_TRACT | Status: AC
  Administered 2021-09-04: 18:00:00 3 mL via RESPIRATORY_TRACT
  Filled 2021-09-04: qty 3

## 2021-09-04 MED ORDER — DILTIAZEM HCL ER COATED BEADS 180 MG PO CP24
180.0000 mg | ORAL_CAPSULE | Freq: Once | ORAL | Status: AC
  Administered 2021-09-04: 14:00:00 180 mg via ORAL
  Filled 2021-09-04: qty 1

## 2021-09-04 MED ORDER — IOHEXOL 350 MG/ML SOLN
80.0000 mL | Freq: Once | INTRAVENOUS | Status: AC | PRN
  Administered 2021-09-04: 17:00:00 80 mL via INTRAVENOUS

## 2021-09-04 MED ORDER — DOXYCYCLINE HYCLATE 100 MG PO CAPS: 100.0000 mg | ORAL_CAPSULE | Freq: Two times a day (BID) | ORAL | 0 refills | Status: DC

## 2021-09-04 MED ORDER — AZITHROMYCIN 250 MG PO TABS: 500.0000 mg | ORAL_TABLET | Freq: Once | ORAL | Status: DC

## 2021-09-04 MED ORDER — METHYLPREDNISOLONE 4 MG PO TBPK: ORAL_TABLET | Freq: Every day | ORAL | 0 refills | Status: DC

## 2021-09-04 NOTE — ED Provider Notes (Signed)
[facility] EMERGENCY DEPARTMENT Provider Note   CSN: [id number] Arrival date & time: 09/04/21  1159     History Chief Complaint  Patient presents with   Shortness of Breath    Sherri Hart is a 75 y.o. female presents to the ED for evaluation of worsening SOB and cough over the past two days.  She reports the cough is productive, denies any hemoptysis.  She reports she is on 2 L at home.  But has been having to use it more often over the past few days and even had a sleep with oxygen on last night.  She reports her normal oxygenation without supplementation is 96 to 97%.  The patient has A. fib and is on Xarelto.  She reports she is compliant with her Xarelto.  She was off the medication for 4 days during her right knee surgery a little over 2 months ago.  She denies any rhinorrhea or nasal congestion.  Denies any chest pain, lightheadedness, or syncope.  She denies any fevers, abdominal pain, nausea, or vomiting.  Patient reports she did not take her morning Cardizem or pain medication because of the shortness of breath.  Additionally, she reports increasing swelling to her right lower leg.  She reports she is on a daily maintenance inhaler, and thinks it is called Spiriva.  Other daily medications include Cardizem, Lasix, Claritin, Xarelto.  Allergic to penicillins.  Currently still smoker.  Denies any EtOH or drug use.   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   Status post revision of total knee replacement, 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   Atrial fibrillation with RVR ([place]) 0000000   Acute diastolic (congestive) heart failure ([place]) 07/05/2020   Elevated troponin level not due myocardial infarction 07/04/2020   Hypomagnesemia 07/04/2020   Atrial fibrillation with rapid ventricular response ([place]) 07/04/2020   Acute cardiogenic pulmonary edema ([place]) 07/04/2020   COPD with acute exacerbation ([place]) 07/04/2020   Nicotine dependence, cigarettes, uncomplicated [id number]    Past Surgical History:  Procedure Laterality Date   BACK SURGERY     CHOLECYSTECTOMY     NECK SURGERY     REPLACEMENT TOTAL KNEE Right    TOTAL KNEE REVISION Right 06/27/2021   Procedure: TOTAL KNEE REVISION;  Surgeon: Gaynelle Arabian, MD;  Location: [facility];  Service: Orthopedics;  Laterality: Right;   TUBAL LIGATION       OB History   No obstetric history on file.     Family History  Problem Relation Age of Onset   COPD Other     Social History   Tobacco Use   Smoking status: Every Day    Packs/day: 0.50    Years: 53.00    Pack years: 26.50    Types: Cigarettes   Smokeless tobacco: Never  Vaping Use   Vaping Use: Never used  Substance Use Topics   Alcohol use: Never   Drug use: Never    Home Medications Prior to Admission medications   Medication Sig Start Date End Date Taking? Authorizing Provider  albuterol (VENTOLIN HFA) 108 (90 Base) MCG/ACT inhaler Inhale 2 puffs into the lungs every 6 (six) hours as needed for wheezing or shortness of breath. 02/04/21  Yes Gonfa, Charlesetta Ivory, MD  diltiazem (CARDIZEM CD) 180 MG 24 hr capsule Take 1 capsule (180 mg total) by mouth daily. 02/04/21  Yes Mercy Riding, MD  doxycycline (VIBRAMYCIN) 100 MG capsule Take 1 capsule (100 mg total) by mouth 2 (two) times daily. 09/04/21  Yes Sherrell Puller, PA-C  furosemide (LASIX) 40 MG tablet Take 1 tablet  (40 mg total) by mouth daily. 02/04/21  Yes Mercy Riding, MD  loratadine (CLARITIN) 10 MG tablet Take 10 mg by mouth daily as needed for allergies.   Yes [provider]  methylPREDNISolone (MEDROL DOSEPAK) 4 MG TBPK tablet Take by mouth daily for 6 days. Taper over 6 days 09/04/21 09/10/21 Yes Sherrell Puller, PA-C  oxyCODONE (OXY IR/ROXICODONE) 5 MG immediate release tablet Take 1-2 tablets (5-10 mg total) by mouth every 6 (six) hours as needed for severe pain. 07/04/21  Yes Fenton Foy D, PA-C  rivaroxaban (XARELTO) 20 MG TABS tablet Take 1 tablet (20 mg total) by mouth daily with supper. 07/04/21  Yes Fenton Foy D, PA-C  methocarbamol (ROBAXIN) 500 MG tablet Take 1 tablet (500 mg total) by mouth every 6 (six) hours as needed for muscle spasms. Patient not taking: Reported on 09/04/2021 07/04/21   Fenton Foy D, PA-C  rivaroxaban (XARELTO) 20 MG TABS tablet TAKE 1 TABLET(20 MG) BY MOUTH DAILY WITH SUPPER Patient not taking: Reported on 09/04/2021 07/30/21   Almyra Deforest, PA  tiotropium (SPIRIVA) 18 MCG inhalation capsule Place 1 capsule (18 mcg total) into inhaler and inhale daily. 09/04/21   Sherrell Puller, PA-C  traMADol (ULTRAM) 50 MG tablet Take 1-2 tablets (50-100 mg total) by mouth every 6 (six) hours as needed for moderate pain. Patient not taking: Reported on 09/04/2021 07/04/21   Jonnie Kind, PA-C    Allergies    Penicillins  Review of Systems   Review of Systems  Constitutional:  Negative for chills and fever.  HENT:  Negative for congestion, ear pain, rhinorrhea and sore throat.   Eyes:  Negative for pain and visual disturbance.  Respiratory:  Positive for cough and shortness of breath.   Cardiovascular:  Positive for leg swelling. Negative for chest pain and palpitations.  Gastrointestinal:  Negative for abdominal pain, constipation, diarrhea, nausea and vomiting.  Genitourinary:  Negative for dysuria and hematuria.  Musculoskeletal:  Negative for  arthralgias, back pain and myalgias.  Skin:  Negative for color change and rash.  Neurological:  Negative for dizziness, seizures, syncope, weakness and light-headedness.  All other systems reviewed and are negative.  Physical Exam Updated Vital Signs BP (!) 141/71    Pulse 84    Temp 97.9 F (36.6 C) (Oral)    Resp 15    Ht 5\' 7"  (1.702 m)    Wt 108.4 kg    SpO2 97%    BMI 37.43 kg/m   Physical Exam Vitals and nursing note reviewed.  Constitutional:      General: She is not in acute distress.    Appearance: Normal appearance. She is not toxic-appearing.  HENT:     Head:  Normocephalic and atraumatic.     Mouth/Throat:     Mouth: Mucous membranes are moist.  Eyes:     General: No scleral icterus. Cardiovascular:     Rate and Rhythm: Normal rate. Rhythm irregular.     Comments: Irregularly irregular rhythm Pulmonary:     Effort: No accessory muscle usage or respiratory distress.     Comments: Mildly tachypneic, but the patient is still able to speak in full sentences.  She has inspiratory wheezing throughout with coarse wheezing in the bilateral bases right greater than left.  No accessory muscle use, tripoding, nasal flaring, or cyanosis noted.  Patient is satting 94% on 2 L. Abdominal:     General: Abdomen is flat. Bowel sounds are normal.     Palpations: Abdomen is soft.  Musculoskeletal:        General: No deformity.     Cervical back: Normal range of motion.     Right lower leg: Tenderness present. Edema present.     Left lower leg: No tenderness. Edema present.     Comments: Pulses intact.  Compartments are soft.  Patient has pain with minimal movement to her right leg.  Right leg is more swollen than the left.  Skin:    General: Skin is warm and dry.  Neurological:     General: No focal deficit present.     Mental Status: She is alert. Mental status is at baseline.    ED Results / Procedures / Treatments   Labs (all labs ordered are listed, but only abnormal results  are displayed) Labs Reviewed  CBC WITH DIFFERENTIAL/PLATELET - Abnormal; Notable for the following components:      Result Value   Hemoglobin 15.2 (*)    HCT 49.5 (*)    RDW 15.6 (*)    All other components within normal limits  COMPREHENSIVE METABOLIC PANEL - Abnormal; Notable for the following components:   Calcium 8.8 (*)    All other components within normal limits  BRAIN NATRIURETIC PEPTIDE - Abnormal; Notable for the following components:   B Natriuretic Peptide 282.5 (*)    All other components within normal limits  RESP PANEL BY RT-PCR (FLU A&B, COVID) ARPGX2  TROPONIN I (HIGH SENSITIVITY)  TROPONIN I (HIGH SENSITIVITY)    EKG EKG Interpretation  Date/Time:  Tuesday September 04 2021 12:08:11 EST Ventricular Rate:  91 PR Interval:    QRS Duration: 102 QT Interval:  372 QTC Calculation: 458 R Axis:   36 Text Interpretation: Atrial fibrillation Probable anteroseptal infarct, old Minimal ST depression, anterolateral leads Since last tracing Non-specific ST-t changes NOW PRESENT Confirmed by Calvert Cantor [phone]) on 09/04/2021 12:30:59 PM  Radiology DG Chest 2 View  Result Date: 09/04/2021 CLINICAL DATA:  Short of breath, cough, history of COPD EXAM: CHEST - 2 VIEW COMPARISON:  Prior chest x-ray 06/30/2021 FINDINGS: Cardiomegaly. Atherosclerotic calcifications present in the transverse aorta. Pulmonary vascular congestion without overt edema. Hyperinflation. No significant pleural effusion. Chronic bronchitic changes. No pneumothorax or focal airspace infiltrate. No acute osseous abnormality. Incompletely imaged cervical stabilization hardware again noted. IMPRESSION: Cardiomegaly with pulmonary vascular congestion without overt edema. Background of hyperinflation and chronic bronchitic changes consistent with COPD. Electronically Signed   By: Jacqulynn Cadet M.D.   On: 09/04/2021 13:15   CT Angio Chest PE W and/or Wo Contrast  Result Date: 09/04/2021 CLINICAL DATA:   Shortness of breath EXAM: CT ANGIOGRAPHY CHEST WITH CONTRAST TECHNIQUE: Multidetector CT imaging of the chest was performed using the standard protocol  during bolus administration of intravenous contrast. Multiplanar CT image reconstructions and MIPs were obtained to evaluate the vascular anatomy. CONTRAST:  36mL OMNIPAQUE IOHEXOL 350 MG/ML SOLN COMPARISON:  10/04/2020 FINDINGS: Cardiovascular: Mild cardiomegaly with biatrial enlargement. Satisfactory opacification of pulmonary arteries noted, and there is no evidence of pulmonary emboli. 3 vessel coronary calcifications. Incomplete opacification of the thoracic aorta, with no aneurysm or suggestion of stenosis or dissection. Scattered calcified plaque in the distal arch and descending thoracic segment as well as in the visualized nondilated proximal abdominal aorta. Mediastinum/Nodes: No mass or adenopathy. Lungs/Pleura: No pleural effusion. Subpleural blebs in both upper lobes. Minimal dependent atelectasis posteriorly at the right lung base. Fluid or debris in right lower lobe segmental bronchi. Stable 4 mm subpleural nodule in the posterior basal segment right lower lobe image 73/7. No new nodule or mass. 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 Cholecystectomy clips with dilated CBD, stable. No acute findings. Musculoskeletal: Cervical fixation hardware partially visualized. Anterior vertebral endplate spurring at multiple levels in the lower thoracic spine. Review of the MIP images confirms the above findings. IMPRESSION: 1. Negative for acute PE. 2. Coronary and Aortic Atherosclerosis (ICD10-170.0). 3.  Emphysema (ICD10-J43.9). Electronically Signed   By: Lucrezia Europe M.D.   On: 09/04/2021 17:14    Procedures Procedures   Medications Ordered in ED Medications  ipratropium-albuterol (DUONEB) 0.5-2.5 (3) MG/3ML nebulizer solution 3 mL (3 mLs Nebulization Given 09/04/21 1313)  oxyCODONE (Oxy IR/ROXICODONE) immediate  release tablet 10 mg (10 mg Oral Given 09/04/21 1313)  diltiazem (CARDIZEM CD) 24 hr capsule 180 mg (180 mg Oral Given 09/04/21 1422)  iohexol (OMNIPAQUE) 350 MG/ML injection 80 mL (80 mLs Intravenous Contrast Given 09/04/21 1651)  ipratropium-albuterol (DUONEB) 0.5-2.5 (3) MG/3ML nebulizer solution 3 mL (3 mLs Nebulization Given 09/04/21 1755)  doxycycline (VIBRA-TABS) tablet 100 mg (100 mg Oral Given 09/04/21 1755)    ED Course  I have reviewed the triage vital signs and the nursing notes.  Pertinent labs & imaging results that were available during my care of the patient were reviewed by me and considered in my medical decision making (see chart for details).  75 y/o F reports to the ED for eval of worsening SOB and productive cough. Patient was 92% on RA for EMS and was placed on 2L [place] and increased to 94-98%. Recent right knee replacement. Mildly hypertensive, but the patient does not have increased work of breath and is satting 97% on her at home 2L [place]. The patient has known afib and is rate controlled, not rhythm.  Physical exam shows some rhonchorous breath sounds in bilateral lower bases. The patient is speaking in full sentences.Right leg is more swollen than the left. Pulses intact. Compartments are soft. No overlying skin changes noted. Due to increased risk factors, with CTA patient's chest for PE.   CT chest shows cardiomegaly with pulmonary vascular congestion without overt edema.  Background of hyperinflation and chronic bronchial changes consistent with COPD.  CTA of her chest is negative for acute PE, coronary and aortic arterial sclerosis present as well as emphysema.  CBC shows no leukocytosis.  Increased H&H consistent with hypoxia.  Troponins are flat.  Elevated BNP but consistent with patient's previously readings.  Negative for COVID and flu.  CMP shows no Electra abnormalities, however slightly decreased calcium.  Normal LFTs.  Patient did not take her morning pain medicine  or Cardizem.  While here these were ordered.  Additionally she received 2 duo  nebs and reports that she is feeling much better.  With the reassuring lab work as well as physical exam findings, this is likely a COPD exacerbation.  The patient is not requiring any new oxygen as she already is on 2 L at home as needed.  Her wheezing has improved on exam after the DuoNeb treatments.  Additionally, she reports when she is been out of her Spiriva inhaler for a few days.  We will place the patient on doxycycline, Medrol pack, and refill her Spiriva.  We will give dose of Doxy as well as additional dose of her pain medication.  Strict return precautions discussed with patient.  Recommended follow-up with PCP within the week.  Patient agrees with plan.  Patient is stable being discharged home in good condition.   MDM Rules/Calculators/A&P                          Final Clinical Impression(s) / ED Diagnoses Final diagnoses:  COPD exacerbation (HCC)    Rx / DC Orders ED Discharge Orders          Ordered    tiotropium (SPIRIVA) 18 MCG inhalation capsule  Daily        09/04/21 1750    doxycycline (VIBRAMYCIN) 100 MG capsule  2 times daily        09/04/21 1750    methylPREDNISolone (MEDROL DOSEPAK) 4 MG TBPK tablet  Daily        09/04/21 1750             Achille Rich, PA-C 09/05/21 1031    Pollyann Savoy, MD 09/07/21 [phone]

## 2021-09-04 NOTE — Discharge Instructions (Addendum)
You have been given doxycycline, prednisone, and a refill of your Spiriva. Because of your recent illness, you may need to use your at home oxygen more often. Please check your oxygen saturation at home. Please follow up with your PCP this week for re-evaluation.  If you have any worsening shortness of breath, syncope, lightheadedness please return to the ER for re-evaluation. You need to stop smoking to better the functioning of your lungs.

## 2021-09-04 NOTE — ED Provider Notes (Incomplete)
[facility] EMERGENCY DEPARTMENT Provider Note   CSN: [id number] Arrival date & time: 09/04/21  1159     History Chief Complaint  Patient presents with   Shortness of Breath    Sherri Hart is a 75 y.o. female.   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   Atrial fibrillation with RVR (HCC) 02/01/2021   Acute diastolic (congestive) heart failure (HCC) 07/05/2020   Elevated troponin level not due myocardial infarction 07/04/2020   Hypomagnesemia 07/04/2020   Atrial fibrillation with rapid ventricular response (HCC) 07/04/2020   Acute cardiogenic pulmonary edema (HCC) 07/04/2020   COPD with acute exacerbation (HCC) 07/04/2020   Nicotine dependence, cigarettes, uncomplicated 07/04/2020    Past Surgical History:  Procedure Laterality Date   BACK SURGERY     CHOLECYSTECTOMY     NECK SURGERY     REPLACEMENT TOTAL KNEE Right    TOTAL KNEE REVISION Right 06/27/2021   Procedure: TOTAL KNEE REVISION;  Surgeon: Ollen Gross, MD;  Location: [facility];  Service: Orthopedics;  Laterality: Right;   TUBAL LIGATION       OB History   No obstetric history on file.     Family History  Problem Relation Age of Onset   COPD Other     Social History   Tobacco Use   Smoking status: Every Day    Packs/day: 0.50    Years: 53.00    Pack years: 26.50    Types: Cigarettes   Smokeless tobacco: Never  Vaping Use   Vaping Use: Never used  Substance Use  Topics   Alcohol use: Never   Drug use: Never    Home Medications Prior to Admission medications   Medication Sig Start Date End Date Taking? Authorizing Provider  albuterol (VENTOLIN HFA) 108 (90 Base) MCG/ACT inhaler Inhale 2 puffs into the lungs every 6 (six) hours as needed for wheezing or shortness of breath. 02/04/21   Almon Hercules, MD  diltiazem (CARDIZEM CD) 180 MG 24 hr capsule Take 1 capsule (180 mg total) by mouth daily. 02/04/21   Almon Hercules, MD  furosemide (LASIX) 40 MG tablet Take 1 tablet (40 mg total) by mouth daily. 02/04/21   Almon Hercules, MD  loratadine (CLARITIN) 10 MG tablet Take 10 mg by mouth daily as needed for allergies.    [provider]  methocarbamol (ROBAXIN) 500 MG tablet Take 1 tablet (500 mg total) by mouth every 6 (six) hours as needed for muscle spasms. 07/04/21   Nelia Shi D, PA-C  oxyCODONE (OXY IR/ROXICODONE) 5 MG immediate release tablet Take 1-2 tablets (5-10 mg total) by mouth every 6 (six) hours as needed for severe pain. 07/04/21   Nelia Shi D, PA-C  rivaroxaban (XARELTO) 20 MG TABS tablet Take 1 tablet (20 mg total) by mouth daily with supper. 07/04/21   Nelia Shi D, PA-C  rivaroxaban Carlena Hurl)  20 MG TABS tablet TAKE 1 TABLET(20 MG) BY MOUTH DAILY WITH SUPPER 07/30/21   Almyra Deforest, PA  traMADol (ULTRAM) 50 MG tablet Take 1-2 tablets (50-100 mg total) by mouth every 6 (six) hours as needed for moderate pain. 07/04/21   Fenton Foy D, PA-C    Allergies    Penicillins  Review of Systems   Review of Systems  Respiratory:  Positive for shortness of breath.    Physical Exam Updated Vital Signs BP 117/63    Pulse 73    Temp 97.9 F (36.6 C) (Oral)    Resp (!) 21    Ht 5\' 7"  (1.702 m)    Wt 108.4 kg    SpO2 94%    BMI 37.43 kg/m   Physical Exam Constitutional:      General: She is not in acute distress.    Appearance: Normal appearance. She is not toxic-appearing.  HENT:     Head: Normocephalic and atraumatic.      Mouth/Throat:     Mouth: Mucous membranes are moist.  Eyes:     General: No scleral icterus. Cardiovascular:     Rate and Rhythm: Normal rate. Rhythm irregular.     Comments: Irregularly irregular rhythm  Pulmonary:     Effort: Pulmonary effort is normal. No respiratory distress.  Abdominal:     General: Abdomen is flat. Bowel sounds are normal.     Palpations: Abdomen is soft.  Musculoskeletal:        General: No deformity.     Cervical back: Normal range of motion.  Skin:    General: Skin is warm and dry.  Neurological:     General: No focal deficit present.     Mental Status: She is alert. Mental status is at baseline.    ED Results / Procedures / Treatments   Labs (all labs ordered are listed, but only abnormal results are displayed) Labs Reviewed  RESP PANEL BY RT-PCR (FLU A&B, COVID) ARPGX2  CBC WITH DIFFERENTIAL/PLATELET  COMPREHENSIVE METABOLIC PANEL  BRAIN NATRIURETIC PEPTIDE  TROPONIN I (HIGH SENSITIVITY)    EKG EKG Interpretation  Date/Time:  Tuesday September 04 2021 12:08:11 EST Ventricular Rate:  91 PR Interval:    QRS Duration: 102 QT Interval:  372 QTC Calculation: 458 R Axis:   36 Text Interpretation: Atrial fibrillation Probable anteroseptal infarct, old Minimal ST depression, anterolateral leads Since last tracing Non-specific ST-t changes NOW PRESENT Confirmed by Calvert Cantor [phone]) on 09/04/2021 12:30:59 PM  Radiology No results found.  Procedures Procedures   Medications Ordered in ED Medications  ipratropium-albuterol (DUONEB) 0.5-2.5 (3) MG/3ML nebulizer solution 3 mL (has no administration in time range)  oxyCODONE (Oxy IR/ROXICODONE) immediate release tablet 10 mg (has no administration in time range)    ED Course  I have reviewed the triage vital signs and the nursing notes.  Pertinent labs & imaging results that were available during my care of the patient were reviewed by me and considered in my medical decision making  (see chart for details).    MDM Rules/Calculators/A&P                         *** {Remember to document critical care time when appropriate:1}   Final Clinical Impression(s) / ED Diagnoses Final diagnoses:  None    Rx / DC Orders ED Discharge Orders     None

## 2021-09-04 NOTE — ED Triage Notes (Signed)
Patient brought to ED via EMS from home with c/o shob x3 days. 92% on room air with GFD, patient uses O2 at home PRN for COPD exacerbation. Hx COPD, CHF, afib. EMS reports rhonchi present in right lower lobe. Patient alert and oriented x 4 on arrival to ED, dyspnea at rest. 92% on room air, 2L [place] applied, increased to 94-98%. Productive cough present. Patient is a current smoker. Right knee replacement x 1 month ago. Patient states she did not take her cardizem or pain meds this AM due to shortness of breath.  EMS v/s: 94% 2L [place] 134/86 22 RR

## 2021-09-06 ENCOUNTER — Inpatient Hospital Stay (HOSPITAL_COMMUNITY)
Admission: EM | Admit: 2021-09-06 | Discharge: 2021-09-10 | DRG: 190 | Disposition: A | Discharge status: Home / Self Care | Payer: Medicare HMO | Attending: Internal Medicine | Admitting: Internal Medicine

## 2021-09-06 ENCOUNTER — Encounter (HOSPITAL_COMMUNITY): Payer: Self-pay | Admitting: Emergency Medicine

## 2021-09-06 ENCOUNTER — Other Ambulatory Visit: Payer: Self-pay

## 2021-09-06 ENCOUNTER — Emergency Department (HOSPITAL_COMMUNITY): Payer: Medicare HMO

## 2021-09-06 DIAGNOSIS — Z825 Family history of asthma and other chronic lower respiratory diseases: Secondary | ICD-10-CM

## 2021-09-06 DIAGNOSIS — M25561 Pain in right knee: Secondary | ICD-10-CM | POA: Diagnosis present

## 2021-09-06 DIAGNOSIS — Z96651 Presence of right artificial knee joint: Secondary | ICD-10-CM | POA: Diagnosis present

## 2021-09-06 DIAGNOSIS — F17210 Nicotine dependence, cigarettes, uncomplicated: Secondary | ICD-10-CM | POA: Diagnosis present

## 2021-09-06 DIAGNOSIS — J441 Chronic obstructive pulmonary disease with (acute) exacerbation: Principal | ICD-10-CM | POA: Diagnosis present

## 2021-09-06 DIAGNOSIS — B9729 Other coronavirus as the cause of diseases classified elsewhere: Secondary | ICD-10-CM | POA: Diagnosis present

## 2021-09-06 DIAGNOSIS — Z79899 Other long term (current) drug therapy: Secondary | ICD-10-CM

## 2021-09-06 DIAGNOSIS — Z8701 Personal history of pneumonia (recurrent): Secondary | ICD-10-CM

## 2021-09-06 DIAGNOSIS — Z7951 Long term (current) use of inhaled steroids: Secondary | ICD-10-CM

## 2021-09-06 DIAGNOSIS — J1289 Other viral pneumonia: Secondary | ICD-10-CM | POA: Diagnosis present

## 2021-09-06 DIAGNOSIS — J440 Chronic obstructive pulmonary disease with acute lower respiratory infection: Secondary | ICD-10-CM | POA: Diagnosis present

## 2021-09-06 DIAGNOSIS — Z880 Allergy status to penicillin: Secondary | ICD-10-CM

## 2021-09-06 DIAGNOSIS — Z9851 Tubal ligation status: Secondary | ICD-10-CM

## 2021-09-06 DIAGNOSIS — M1990 Unspecified osteoarthritis, unspecified site: Secondary | ICD-10-CM | POA: Diagnosis present

## 2021-09-06 DIAGNOSIS — Z20822 Contact with and (suspected) exposure to covid-19: Secondary | ICD-10-CM | POA: Diagnosis present

## 2021-09-06 DIAGNOSIS — I5032 Chronic diastolic (congestive) heart failure: Secondary | ICD-10-CM | POA: Diagnosis present

## 2021-09-06 DIAGNOSIS — I4891 Unspecified atrial fibrillation: Secondary | ICD-10-CM | POA: Diagnosis present

## 2021-09-06 DIAGNOSIS — Z7901 Long term (current) use of anticoagulants: Secondary | ICD-10-CM

## 2021-09-06 DIAGNOSIS — Z9981 Dependence on supplemental oxygen: Secondary | ICD-10-CM

## 2021-09-06 DIAGNOSIS — J9611 Chronic respiratory failure with hypoxia: Secondary | ICD-10-CM | POA: Diagnosis present

## 2021-09-06 LAB — BASIC METABOLIC PANEL
Anion gap: 11 (ref 5–15)
BUN: 12 mg/dL (ref 8–23)
CO2: 25 mmol/L (ref 22–32)
Calcium: 9.1 mg/dL (ref 8.9–10.3)
Chloride: 103 mmol/L (ref 98–111)
Creatinine, Ser: 1.03 mg/dL — ABNORMAL HIGH (ref 0.44–1.00)
GFR, Estimated: 57 mL/min — ABNORMAL LOW (ref >60)
Glucose, Bld: 99 mg/dL (ref 70–99)
Potassium: 3.7 mmol/L (ref 3.5–5.1)
Sodium: 139 mmol/L (ref 135–145)

## 2021-09-06 LAB — CBC WITH DIFFERENTIAL/PLATELET
Abs Immature Granulocytes: 0.02 10*3/uL (ref 0.00–0.07)
Basophils Absolute: 0 10*3/uL (ref 0.0–0.1)
Basophils Relative: 0 %
Eosinophils Absolute: 0 10*3/uL (ref 0.0–0.5)
Eosinophils Relative: 1 %
HCT: 46.2 % — ABNORMAL HIGH (ref 36.0–46.0)
Hemoglobin: 14.7 g/dL (ref 12.0–15.0)
Immature Granulocytes: 0 %
Lymphocytes Relative: 29 %
Lymphs Abs: 1.8 10*3/uL (ref 0.7–4.0)
MCH: 31.3 pg (ref 26.0–34.0)
MCHC: 31.8 g/dL (ref 30.0–36.0)
MCV: 98.5 fL (ref 80.0–100.0)
Monocytes Absolute: 0.6 10*3/uL (ref 0.1–1.0)
Monocytes Relative: 9 %
Neutro Abs: 3.9 10*3/uL (ref 1.7–7.7)
Neutrophils Relative %: 61 %
Platelets: 162 10*3/uL (ref 150–400)
RBC: 4.69 MIL/uL (ref 3.87–5.11)
RDW: 15.4 % (ref 11.5–15.5)
WBC: 6.4 10*3/uL (ref 4.0–10.5)
nRBC: 0 % (ref 0.0–0.2)

## 2021-09-06 LAB — LIPASE, BLOOD: Lipase: 25 U/L (ref 11–51)

## 2021-09-06 NOTE — ED Triage Notes (Signed)
Pt here from home with complaints of increased SOB. Seen 2X days ago for same. No improvement. Pt requiring O2 support throughout the day. Endorses chest and rib pain. Pt tachypneic, labored.Pt requiring 4 liters . Rhonchi lung sounds  132/74 HR 110 SpO2 89 RA  Received: duo neb tx by EMS

## 2021-09-06 NOTE — ED Provider Notes (Signed)
Emergency Medicine Provider Triage Evaluation Note  Sherri Hart , a 75 y.o. female  was evaluated in triage.  Pt complains of shortness of breath onset 2 days. Associated cough x clear/white sputum, chills, upper abdominal pain. Tried spiriva and medications Rx on her last visit.  Denies fever, chest pain. Has history of COPD. Wears 2 L oxygen at home as needed, patient has had to increase her oxygen to 4 L since being out of the hospital.  Patient was evaluated in the ED on 09/03/2021 for similar symptoms.  Review of Systems  Positive: Shortness of breath, cough Negative: Chest pain  Physical Exam  BP 138/87    Pulse (!) 119    Temp 98.1 F (36.7 C) (Oral)    Resp 16    SpO2 100%  Gen:   Awake, no distress   Resp:  Normal effort  MSK:   Moves extremities without difficulty  Other:  Mild epigastric tenderness to palpation.  No chest wall tenderness to palpation.  Medical Decision Making  Medically screening exam initiated at 8:02 PM.  Appropriate orders placed.  [name] was informed that the remainder of the evaluation will be completed by another provider, this initial triage assessment does not replace that evaluation, and the importance of remaining in the ED until their evaluation is complete.  8:04 PM - Discussed with RN that patient is in need of a room immediately. RN aware and working on room placement.    Klarisa Barman A, PA-C 09/06/21 2008    Goldston, Scott, MD 09/06/21 2232

## 2021-09-07 ENCOUNTER — Observation Stay (HOSPITAL_COMMUNITY): Payer: Medicare HMO

## 2021-09-07 DIAGNOSIS — J441 Chronic obstructive pulmonary disease with (acute) exacerbation: Secondary | ICD-10-CM | POA: Diagnosis present

## 2021-09-07 DIAGNOSIS — R0609 Other forms of dyspnea: Secondary | ICD-10-CM

## 2021-09-07 LAB — ECHOCARDIOGRAM COMPLETE
AR max vel: 2.14 cm2
AV Area VTI: 1.85 cm2
AV Area mean vel: 1.88 cm2
AV Mean grad: 3.3 mmHg
AV Peak grad: 5.7 mmHg
Ao pk vel: 1.19 m/s
S' Lateral: 3.4 cm

## 2021-09-07 LAB — RESP PANEL BY RT-PCR (FLU A&B, COVID) ARPGX2
Influenza A by PCR: NEGATIVE
Influenza B by PCR: NEGATIVE
SARS Coronavirus 2 by RT PCR: NEGATIVE

## 2021-09-07 MED ORDER — ONDANSETRON HCL 4 MG/2ML IJ SOLN
4.0000 mg | Freq: Once | INTRAMUSCULAR | Status: AC
  Administered 2021-09-07: 06:00:00 4 mg via INTRAVENOUS
  Filled 2021-09-07: qty 2

## 2021-09-07 MED ORDER — OXYCODONE HCL 5 MG PO TABS
5.0000 mg | ORAL_TABLET | Freq: Four times a day (QID) | ORAL | Status: DC | PRN
Start: 2021-09-07 — End: 2021-09-11
  Administered 2021-09-07 – 2021-09-09 (×7): 10 mg via ORAL
  Administered 2021-09-09: 5 mg via ORAL
  Administered 2021-09-09 – 2021-09-10 (×5): 10 mg via ORAL
  Filled 2021-09-07 (×13): qty 2

## 2021-09-07 MED ORDER — SODIUM CHLORIDE 0.9 % IV SOLN
100.0000 mg | Freq: Two times a day (BID) | INTRAVENOUS | Status: DC
  Administered 2021-09-07: 04:00:00 100 mg via INTRAVENOUS
  Filled 2021-09-07: qty 100

## 2021-09-07 MED ORDER — NICOTINE 14 MG/24HR TD PT24
14.0000 mg | MEDICATED_PATCH | Freq: Every day | TRANSDERMAL | Status: DC
  Administered 2021-09-07 – 2021-09-10 (×4): 14 mg via TRANSDERMAL
  Filled 2021-09-07 (×4): qty 1

## 2021-09-07 MED ORDER — FUROSEMIDE 40 MG PO TABS
40.0000 mg | ORAL_TABLET | Freq: Every day | ORAL | Status: DC
  Administered 2021-09-07 – 2021-09-10 (×4): 40 mg via ORAL
  Filled 2021-09-07: qty 2
  Filled 2021-09-07 (×3): qty 1

## 2021-09-07 MED ORDER — ACETAMINOPHEN 325 MG PO TABS
650.0000 mg | ORAL_TABLET | Freq: Four times a day (QID) | ORAL | Status: DC | PRN
  Administered 2021-09-09: 650 mg via ORAL
  Filled 2021-09-07: qty 2

## 2021-09-07 MED ORDER — RIVAROXABAN 20 MG PO TABS
20.0000 mg | ORAL_TABLET | Freq: Every day | ORAL | Status: DC
  Administered 2021-09-07 – 2021-09-10 (×4): 20 mg via ORAL
  Filled 2021-09-07: qty 2
  Filled 2021-09-07 (×3): qty 1

## 2021-09-07 MED ORDER — ACETAMINOPHEN 650 MG RE SUPP: 650.0000 mg | Freq: Four times a day (QID) | RECTAL | Status: DC | PRN

## 2021-09-07 MED ORDER — IPRATROPIUM-ALBUTEROL 0.5-2.5 (3) MG/3ML IN SOLN
3.0000 mL | Freq: Four times a day (QID) | RESPIRATORY_TRACT | Status: DC
  Administered 2021-09-07 – 2021-09-10 (×13): 3 mL via RESPIRATORY_TRACT
  Filled 2021-09-07 (×15): qty 3

## 2021-09-07 MED ORDER — CEFTRIAXONE SODIUM 1 G IJ SOLR
1.0000 g | INTRAMUSCULAR | Status: DC
  Administered 2021-09-07 – 2021-09-09 (×3): 1 g via INTRAVENOUS
  Filled 2021-09-07 (×3): qty 10

## 2021-09-07 MED ORDER — GUAIFENESIN ER 600 MG PO TB12
600.0000 mg | ORAL_TABLET | Freq: Two times a day (BID) | ORAL | Status: DC
  Administered 2021-09-07 – 2021-09-10 (×8): 600 mg via ORAL
  Filled 2021-09-07 (×8): qty 1

## 2021-09-07 MED ORDER — UMECLIDINIUM-VILANTEROL 62.5-25 MCG/ACT IN AEPB
1.0000 | INHALATION_SPRAY | Freq: Every day | RESPIRATORY_TRACT | Status: DC
  Administered 2021-09-08 – 2021-09-10 (×3): 1 via RESPIRATORY_TRACT
  Filled 2021-09-07: qty 14

## 2021-09-07 MED ORDER — SODIUM CHLORIDE 0.9% FLUSH
3.0000 mL | Freq: Two times a day (BID) | INTRAVENOUS | Status: DC
  Administered 2021-09-07 – 2021-09-10 (×7): 3 mL via INTRAVENOUS

## 2021-09-07 MED ORDER — DILTIAZEM HCL ER COATED BEADS 180 MG PO CP24
180.0000 mg | ORAL_CAPSULE | Freq: Every day | ORAL | Status: DC
  Administered 2021-09-07 – 2021-09-10 (×4): 180 mg via ORAL
  Filled 2021-09-07 (×4): qty 1

## 2021-09-07 MED ORDER — SODIUM CHLORIDE 0.9 % IV SOLN
500.0000 mg | INTRAVENOUS | Status: DC
  Administered 2021-09-07 – 2021-09-09 (×3): 500 mg via INTRAVENOUS
  Filled 2021-09-07 (×4): qty 5

## 2021-09-07 MED ORDER — IPRATROPIUM-ALBUTEROL 0.5-2.5 (3) MG/3ML IN SOLN
3.0000 mL | RESPIRATORY_TRACT | Status: AC
  Administered 2021-09-07 (×3): 3 mL via RESPIRATORY_TRACT
  Filled 2021-09-07: qty 9

## 2021-09-07 MED ORDER — FENTANYL CITRATE PF 50 MCG/ML IJ SOSY
50.0000 ug | PREFILLED_SYRINGE | Freq: Once | INTRAMUSCULAR | Status: AC
  Administered 2021-09-07: 04:00:00 50 ug via INTRAVENOUS
  Filled 2021-09-07: qty 1

## 2021-09-07 MED ORDER — ONDANSETRON HCL 4 MG PO TABS: 4.0000 mg | ORAL_TABLET | Freq: Four times a day (QID) | ORAL | Status: DC | PRN

## 2021-09-07 MED ORDER — SENNOSIDES-DOCUSATE SODIUM 8.6-50 MG PO TABS: 1.0000 | ORAL_TABLET | Freq: Every evening | ORAL | Status: DC | PRN

## 2021-09-07 MED ORDER — HYDROCODONE BIT-HOMATROP MBR 5-1.5 MG/5ML PO SOLN
5.0000 mL | Freq: Once | ORAL | Status: AC
Start: 2021-09-07 — End: 2021-09-07
  Administered 2021-09-07: 08:00:00 5 mL via ORAL
  Filled 2021-09-07: qty 5

## 2021-09-07 MED ORDER — METHYLPREDNISOLONE SODIUM SUCC 125 MG IJ SOLR
125.0000 mg | Freq: Once | INTRAMUSCULAR | Status: AC
  Administered 2021-09-07: 04:00:00 125 mg via INTRAVENOUS
  Filled 2021-09-07: qty 2

## 2021-09-07 MED ORDER — GUAIFENESIN 100 MG/5ML PO LIQD
5.0000 mL | Freq: Once | ORAL | Status: AC
  Administered 2021-09-07: 08:00:00 5 mL via ORAL
  Filled 2021-09-07: qty 5

## 2021-09-07 MED ORDER — ONDANSETRON HCL 4 MG/2ML IJ SOLN: 4.0000 mg | Freq: Four times a day (QID) | INTRAMUSCULAR | Status: DC | PRN

## 2021-09-07 MED ORDER — PREDNISONE 20 MG PO TABS
40.0000 mg | ORAL_TABLET | Freq: Every day | ORAL | Status: DC
  Administered 2021-09-08 – 2021-09-10 (×3): 40 mg via ORAL
  Filled 2021-09-07 (×3): qty 2

## 2021-09-07 NOTE — ED Notes (Signed)
RN updated grandaughter, Fonda Kinder

## 2021-09-07 NOTE — Progress Notes (Signed)
Pt brought up via stretcher, vitals are stable and Pt is alert and orientated.

## 2021-09-07 NOTE — Evaluation (Signed)
Occupational Therapy Evaluation Patient Details Name: Sherri Hart MRN: [id number] DOB: 1945-09-17 Today's Date: 09/07/2021   History of Present Illness 75 yo female admitted with SOB requiring 4L O2. PMH COPD R TKA CHF Afib with RVR back surg   Clinical Impression   PT admitted with sob. Pt currently with functional limitiations due to the deficits listed below (see OT problem list). Pt currently appears near baseline for basic transfers and adls. Pt tolerates task at this time with stable O2 saturations. Pt could benefit from follow up session to assess with full adl at sink level  Pt will benefit from skilled OT to increase their independence and safety with adls and balance to allow discharge home.       Recommendations for follow up therapy are one component of a multi-disciplinary discharge planning process, led by the attending physician.  Recommendations may be updated based on patient status, additional functional criteria and insurance authorization.   Follow Up Recommendations  No OT follow up    Assistance Recommended at Discharge None  Functional Status Assessment  Patient has had a recent decline in their functional status and demonstrates the ability to make significant improvements in function in a reasonable and predictable amount of time.  Equipment Recommendations  None recommended by OT    Recommendations for Other Services       Precautions / Restrictions Precautions Precautions: Fall      Mobility Bed Mobility Overal bed mobility: Independent                  Transfers Overall transfer level: Needs assistance   Transfers: Sit to/from Stand Sit to Stand: Supervision                  Balance                                           ADL either performed or assessed with clinical judgement   ADL Overall ADL's : Needs assistance/impaired Eating/Feeding: Independent   Grooming: Independent   Upper Body  Bathing: Supervision/ safety   Lower Body Bathing: Supervison/ safety   Upper Body Dressing : Supervision/safety   Lower Body Dressing: Supervision/safety   Toilet Transfer: Supervision/safety   Toileting- Clothing Manipulation and Hygiene: Supervision/safety       Functional mobility during ADLs: Supervision/safety General ADL Comments: pt don socks with hip flexion     Vision Baseline Vision/History: 1 Wears glasses Ability to See in Adequate Light: 0 Adequate       Perception     Praxis      Pertinent Vitals/Pain Pain Assessment: 0-10 Pain Score: 7  Pain Location: chest Pain Descriptors / Indicators: Constant;Discomfort Pain Intervention(s): Monitored during session;Repositioned     Hand Dominance Left   Extremity/Trunk Assessment Upper Extremity Assessment Upper Extremity Assessment: Overall WFL for tasks assessed   Lower Extremity Assessment Lower Extremity Assessment: RLE deficits/detail RLE Deficits / Details: inability to figure 4 cross but does have knee flexion   Cervical / Trunk Assessment Cervical / Trunk Assessment: Back Surgery   Communication Communication Communication: No difficulties   Cognition Arousal/Alertness: Awake/alert Behavior During Therapy: WFL for tasks assessed/performed Overall Cognitive Status: Within Functional Limits for tasks assessed  General Comments: did require OT telling patient to finish breathing treatment. pt had laid down the treatment and noted to have residual in the container remaining     General Comments  BP stable pt with stable O2 during session after breathing treatment    Exercises     Shoulder Instructions      Home Living Family/patient expects to be discharged to:: Private residence Living Arrangements: Children;Other (Comment) (granddaugter great grandson) Available Help at Discharge: Family;Available 24 hours/day Type of Home: Apartment Home  Access: Stairs to enter Entrance Stairs-Number of Steps: 6 Entrance Stairs-Rails: Left Home Layout: One level     Bathroom Shower/Tub: Chief Strategy Officer: Standard Bathroom Accessibility: Yes   Home Equipment: Agricultural consultant (2 wheels);BSC/3in1;Rollator (4 wheels);Grab bars - tub/shower   Additional Comments: pt lives with her grandaughter and her great grandson Engineer, petroleum) dog name luna      Prior Functioning/Environment Prior Level of Function : Needs assist               ADLs Comments: sits on a cooler in the shower        OT Problem List: Cardiopulmonary status limiting activity      OT Treatment/Interventions: Self-care/ADL training;Energy conservation;DME and/or AE instruction;Therapeutic activities;Balance training;Patient/family education    OT Goals(Current goals can be found in the care plan section) Acute Rehab OT Goals Patient Stated Goal: to return home OT Goal Formulation: With patient Time For Goal Achievement: 09/21/21 Potential to Achieve Goals: Good  OT Frequency: Min 2X/week   Barriers to D/C:            Co-evaluation              AM-PAC OT "6 Clicks" Daily Activity     Outcome Measure Help from another person eating meals?: None Help from another person taking care of personal grooming?: None Help from another person toileting, which includes using toliet, bedpan, or urinal?: None Help from another person bathing (including washing, rinsing, drying)?: None Help from another person to put on and taking off regular upper body clothing?: None Help from another person to put on and taking off regular lower body clothing?: None 6 Click Score: 24   End of Session Equipment Utilized During Treatment: Oxygen Nurse Communication: Mobility status;Precautions  Activity Tolerance: Patient tolerated treatment well Patient left: in bed;with call bell/phone within reach (ED no alarms)  OT Visit Diagnosis: Unsteadiness on feet  (R26.81);Muscle weakness (generalized) (M62.81)                Time: 4540-9811 OT Time Calculation (min): 21 min Charges:  OT General Charges $OT Visit: 1 Visit OT Evaluation $OT Eval Moderate Complexity: 1 Mod   Brynn, OTR/L  Acute Rehabilitation Services Pager: [phone] Office: [phone] .   Mateo Flow 09/07/2021, 3:53 PM

## 2021-09-07 NOTE — H&P (Signed)
Date: 09/07/2021               Patient Name:  Sherri Hart MRN: [id number]  DOB: 04-24-1946 Age / Sex: 75 y.o., female   PCP: Ellyn Hack, MD         Medical Service: Internal Medicine Teaching Service         Attending Physician: Dr. Inez Catalina, MD    First Contact: Champ Mungo, DO Pager: ED [phone]  Second Contact: Eliezer Bottom, MD Pager: Janene Harvey [phone]       After Hours (After 5p/  First Contact Pager: [phone]  weekends / holidays): Second Contact Pager: [phone]   SUBJECTIVE  Chief Complaint: shortness of breath  History of Present Illness: Sherri Hart is a 75 y.o. female with a pertinent PMH of chronic hypoxic respiratory failure 2/2 COPD on 2L oxygen at baseline, chronic diastolic heart failure, atrial fibrillation on Eliquis, osteoarthritis with recent knee replacement on 10/19, who presents to [facility] with shortness of breath and cough that have progressed over the last 2 days.  The patient was seen in [facility] ED on 12/27 for shortness of breath at which time she was provided with supportive care and initiated on doxycycline twice daily for 7 days.  She states that prior to presenting at that time she noticed that her nighttime cough was worsening to the point where her house guests were having to close their door at night because it was keeping him awake.  After being seen in the emergency department, she did not feel better, and notes that yesterday she became short of breath requiring her to sit down after going up 7 steps at home.  Her continued decline was concerning to the point that she felt she needed emergency care.  She states that she normally sleeps with 3 pillows elevating her head at night, and that she has been requiring 5 pillows over the last several days.  She has been having to use her albuterol inhaler up to 4 times per day which is increased from baseline, and though it provides some relief the relief is not sustained.  She does have lower extremity  edema bilaterally though she has had this for some time and is on Lasix for this reason, however states her right lower extremity is more swollen after her right knee replacement about 1 month ago.  She does have occasional sputum production and describes it as thick and white, though it is not constant.  She denies fevers but has had chills.  She also endorses recent weight loss and decreased appetite.  She has not noted any increased tightness in her abdomen or swelling in her arms.  Of note, her medication list includes Spiriva, though she has not ever used this.  In the ED, the patient was treated symptomatically with DuoNeb, Solu-Medrol.  Her  cough was treated symptomatically with Robitussin.  She also received fentanyl for her knee pain secondary to recent knee replacement, however this made her nauseous which was managed with Zofran. She also received hydrocodone bit-homatroprine.  Initial BMP showed elevated creatinine to 1.03 and decreased GFR to 57, however this is minimally different from baseline.  CBC was largely unremarkable.  Respiratory panel was negative for COVID-19 and flu.  Chest x-ray was obtained and showed increased opacity of the right middle lobe distribution concerning for pneumonia or aspiration, COPD changes, and cardiomegaly.  Medications: No current facility-administered medications on file prior to encounter.   Current Outpatient Medications  on File Prior to Encounter  Medication Sig Dispense Refill   albuterol (VENTOLIN HFA) 108 (90 Base) MCG/ACT inhaler Inhale 2 puffs into the lungs every 6 (six) hours as needed for wheezing or shortness of breath. 8 g 2   diltiazem (CARDIZEM CD) 180 MG 24 hr capsule Take 1 capsule (180 mg total) by mouth daily. 90 each 1   doxycycline (VIBRAMYCIN) 100 MG capsule Take 1 capsule (100 mg total) by mouth 2 (two) times daily. 14 capsule 0   furosemide (LASIX) 40 MG tablet Take 1 tablet (40 mg total) by mouth daily. 90 tablet 1    loratadine (CLARITIN) 10 MG tablet Take 10 mg by mouth daily as needed for allergies.     oxyCODONE (OXY IR/ROXICODONE) 5 MG immediate release tablet Take 1-2 tablets (5-10 mg total) by mouth every 6 (six) hours as needed for severe pain. 42 tablet 0   tiotropium (SPIRIVA) 18 MCG inhalation capsule Place 1 capsule (18 mcg total) into inhaler and inhale daily. 30 capsule 0   methocarbamol (ROBAXIN) 500 MG tablet Take 1 tablet (500 mg total) by mouth every 6 (six) hours as needed for muscle spasms. (Patient not taking: Reported on 09/04/2021) 40 tablet 0   methylPREDNISolone (MEDROL DOSEPAK) 4 MG TBPK tablet Take by mouth daily for 6 days. Taper over 6 days (Patient not taking: Reported on 09/07/2021) 21 tablet 0   rivaroxaban (XARELTO) 20 MG TABS tablet Take 1 tablet (20 mg total) by mouth daily with supper. 30 tablet 0   rivaroxaban (XARELTO) 20 MG TABS tablet TAKE 1 TABLET(20 MG) BY MOUTH DAILY WITH SUPPER (Patient not taking: Reported on 09/04/2021) 30 tablet 5   traMADol (ULTRAM) 50 MG tablet Take 1-2 tablets (50-100 mg total) by mouth every 6 (six) hours as needed for moderate pain. (Patient not taking: Reported on 09/04/2021) 40 tablet 0    Past Medical History:  Past Medical History:  Diagnosis Date   Anxiety    Arthritis    Atrial fibrillation ([place])    Complication of anesthesia    used to get migraine headaches after surgery   Congestive heart failure (HCC)    COPD (chronic obstructive pulmonary disease) (HCC)    Dyspnea    if afib causes a problem   Pneumonia     Social:  Patient recently moved from [place] and is living with her granddaughter and great grandson.  Sadly, her daughter was murdered in [place] relatively recently.  She reports being independent in her ADLs, and uses a walker for ambulatory assistance.  She enjoys interacting and playing with her grandson.  She denies alcohol or other substance use but does endorse smoking tobacco.  Her primary care physician is at [facility].  She does have a cardiologist established.    Family History: Family History  Problem Relation Age of Onset   COPD Other   Mother had "fluid around her heart" - deceased at 45  Allergies: Allergies as of 09/06/2021 - Review Complete 09/06/2021  Allergen Reaction Noted   Penicillins  07/04/2020    Review of Systems: A complete ROS was negative except as per HPI.   OBJECTIVE:  Physical Exam: Blood pressure 127/63, pulse (!) 114, temperature 98 F (36.7 C), temperature source Oral, resp. rate (!) 23, SpO2 93 %.  Constitutional: Elderly, chronically ill-appearing female sitting comfortably in bed.  No acute distress noted. Cardio: Tachycardic with irregularly irregular rhythm.  No murmurs, rubs, caps. Pulm: Diffuse wheezing noted.  Decreased inspiratory breath sounds.  Patient with appropriate oxygen saturations on 2 L nasal cannula, able to speak in full sentences, no tripoding. Abdomen: Soft, nontender, nondistended. MSK: Bilateral lower extremity noted to have dependent nonpitting edema.  Surgical scar over right knee. Skin: Warm and dry. Neuro: Alert and oriented x3.  No focal deficit noted. Psych: Normal mood and affect.  Pertinent Labs: CBC    Component Value Date/Time   WBC 6.4 09/06/2021 2012   RBC 4.69 09/06/2021 2012   HGB 14.7 09/06/2021 2012   HCT 46.2 (H) 09/06/2021 2012   PLT 162 09/06/2021 2012   MCV 98.5 09/06/2021 2012   MCH 31.3 09/06/2021 2012   MCHC 31.8 09/06/2021 2012   RDW 15.4 09/06/2021 2012   LYMPHSABS 1.8 09/06/2021 2012   MONOABS 0.6 09/06/2021 2012   EOSABS 0.0 09/06/2021 2012   BASOSABS 0.0 09/06/2021 2012     CMP     Component Value Date/Time   NA 139 09/06/2021 2012   NA 146 (H) 04/19/2021 1051   K 3.7 09/06/2021 2012   CL 103 09/06/2021 2012   CO2 25 09/06/2021 2012   GLUCOSE 99 09/06/2021 2012   BUN 12 09/06/2021 2012   BUN 19 04/19/2021 1051   CREATININE 1.03 (H) 09/06/2021 2012   CALCIUM 9.1 09/06/2021 2012   PROT  6.6 09/04/2021 1330   ALBUMIN 3.7 09/04/2021 1330   AST 17 09/04/2021 1330   ALT 13 09/04/2021 1330   ALKPHOS 101 09/04/2021 1330   BILITOT 0.9 09/04/2021 1330   GFRNONAA 57 (L) 09/06/2021 2012    Pertinent Imaging: DG Chest 2 View  Result Date: 09/06/2021 CLINICAL DATA:  Shortness of breath. EXAM: CHEST - 2 VIEW COMPARISON:  CTA chest and portable chest both 09/04/2021 FINDINGS: Heart is moderately enlarged. No vascular congestion is seen. The aorta is tortuous and ectatic with patchy calcifications, stable mediastinal configuration. There is increased opacity in the right middle lobe distribution concerning for pneumonia or aspiration. Rest of the lungs clear with COPD change. No pleural effusion is seen. Osteopenia and slight thoracic levoscoliosis. Thoracic spondylosis. IMPRESSION: Increased opacity right middle lobe distribution, concern for pneumonia or aspiration. Follow-up recommended. COPD. Cardiomegaly . Electronically Signed   By: Telford Nab M.D.   On: 09/06/2021 20:49    EKG: personally reviewed my interpretation is unchanged from previous tracings, nonspecific ST and T waves changes, atrial fibrillation with RVR  ASSESSMENT & PLAN:  Assessment: Principal Problem:   COPD exacerbation (HCC)   Sherri Hart is a 75 y.o. female with a pertinent PMH of chronic hypoxic respiratory failure 2/2 COPD on 2L oxygen at baseline, chronic diastolic heart failure, atrial fibrillation on Eliquis, osteoarthritis with recent knee replacement on 10/19, who presents to [facility] with shortness of breath and cough that have progressed over the last 2 days.on hospital day 0.  Plan: #COPD exacerbation At baseline patient is on 2 L nasal cannula as needed at home, as well as an as needed albuterol inhaler.  However she has required more supplemental oxygen and use of her inhaler over the last several days.  mMRC of 3, indicated by shortness of breath after climbing stairs and moving at a slower  pace.  This is the fourth hospital presentation of this calendar year due to respiratory concerns.  Given these 2 factors, I would place the patient in Gold group D indicating that she should be managed with a LAMA or LAMA + LABA.  She does still smoke tobacco.  Given her recent move to the  area, I am unable to see if/when PFTs have been done, however there is evidence of COPD on imaging.  She is not currently established with a pulmonologist. -Supplemental oxygen, SpO2 goal >90% -DuoNeb every 6 hours -Anoro Ellipta 1 puff daily -Prednisone 40 mg daily  #Community-acquired pneumonia Patient was evaluated 12/27 and was started on doxycycline 100 mg twice daily for 7 days.  Repeat chest x-ray today shows worsening opacification of the right middle lobe compared to chest x-ray from 12/27, concerning for pneumonia or aspiration.  She is negative for COVID-19 and the flu. -Zithromax 500 mg IV daily, Rocephin 1 g IV daily -Mucinex 600 mg twice daily -Legionella, strep pneumonia labs ordered  #Atrial fibrillation Patient reports that she has been on Xarelto 20 mg daily for about the last month, and prior to that she was on Eliquis for about a year and a half however due to price of the medication she switched.  On exam she is in atrial fibrillation with RVR. -Diltiazem 180 mg daily -Continue home Xarelto 20 mg daily  #Chronic diastolic heart failure Patient reports chronic bilateral lower extremity edema for which she takes Lasix 40 mg daily.  Last echocardiogram was from 06/2020 and noted LV EF of 55 to 60% with normal function and no regional wall motion abnormalities, mild concentric LV hypertrophy; RV systolic function mildly reduced with enlarged RV; mildly elevated pulmonary artery systolic pressure; severely dilated bilateral atria; small pericardial effusion.  Patient denies recent increases in weight or worsening edema, however has required more head elevation in order to sleep at  night. -Continue home Lasix 40 mg daily -Echocardiogram ordered  #Right knee replacement Patient is S/P right knee replacement about 1 month ago.  She reports that she works frequently with physical therapy, and that she does still have moderate to severe pain even at rest. -Tylenol 650 mg every 6 hours as needed mild pain or fever -Oxycodone 5-10 mg every 6 hours as needed severe pain -PT consult placed  #Smoker Patient smokes tobacco, however she has not smoked in 6 days. -Nicotine patch   Best Practice: Diet: Cardiac diet IVF: Fluids: IV push only, no IV fluids VTE: rivaroxaban (XARELTO) tablet 20 mg Start: 09/07/21 1700 Code: Full AB: Rocephin 1 g IV daily, Zithromax 500 mg IV daily Status: Observation with expected length of stay less than 2 midnights. Anticipated Discharge Location: Home Barriers to Discharge: Medical stability  Signature: Harvie Heck, MD Internal Medicine Resident, PGY-3 [facility]  Pager: [phone] 10:24 AM, 09/07/2021   Please contact the on call pager after 5 pm and on weekends at [phone].

## 2021-09-07 NOTE — Hospital Course (Addendum)
#  COPD exacerbation Patient presented to [facility] ED with persistent shortness of breath after being evaluated on 12/27 at which time she was discharged home on doxycycline 100 mg twice daily for 7 days. At baseline patient is on 2 L nasal cannula as needed at home, as well as an as needed albuterol inhaler,  however she had required more supplemental oxygen and use of her inhaler in the days prior to admission.  mMRC of 3, indicated by shortness of breath after climbing stairs and moving at a slower pace, and this is the fourth hospital presentation of this calendar year due to respiratory concerns placing the patient in Gold group D indicating that she should be managed with a LAMA or LAMA + LABA.  On day of admission she was on day 6 without smoking.  She was initiated on DuoNeb every 6 hours, an oral Ellipta 1 puff daily, and prednisone 40 mg daily as well as supplemental oxygen.  Repeat EKG and high-sensitivity troponin were obtained on 12/31 due to complaints of chest pain with EKG showing anterior lead T wave inversion, however high-sensitivity troponin was negative.   #Community-acquired pneumonia Patient was evaluated 12/27 and was started on doxycycline 100 mg twice daily for 7 days.  Repeat chest x-ray at admission showed worsening opacification of the right middle lobe compared to chest x-ray from 12/27, concerning for pneumonia or aspiration.  She was negative for COVID-19 and the flu.  She was initiated on Zithromax 500 mg IV daily and Rocephin 1 g IV daily as well as Mucinex 600 mg twice daily.  Legionella and strep pneumonia labs were ordered and showed ***.   #Atrial fibrillation Patient been on Xarelto 20 mg daily for about the last month, and prior to that she was on Eliquis for about a year and a half.  She was persistently in atrial fibrillation however was mostly rate controlled with infrequent RVR.  She was continued on home diltiazem 180 mg daily and Xarelto 20 mg daily.   #Chronic  diastolic heart failure Patient has chronic bilateral lower extremity edema for which she takes Lasix 40 mg daily.  She has not had recent increases in weight or worsening edema, however has required more head elevation in order to sleep at night. Echocardiogram 12/30 showed LV EF of 60-65% with normal function, no regional wall motion abnormalities, and mild concentric LV hypertrophy; mildly reduced RV systolic function, RV moderately enlarged, moderately elevated PA systolic pressure; severely dilated bilateral atria.  She was continued on home Lasix 40 mg daily.   #Right knee replacement Patient is S/P right knee replacement about 1 month ago.  She has been working with physical therapy at home and endorsed continued moderate to severe pain even at rest.  Her pain was managed with Tylenol 650 mg every 6 hours as needed for mild pain and oxycodone 5-10 mg every 6 hours as needed for severe pain.  Physical therapy evaluated patient and did not have further recommendations.   #Smoker On day of admission, patient had not smoked tobacco in 6 days.  She was managed with a nicotine patch.

## 2021-09-07 NOTE — ED Notes (Signed)
Pts diaper was changed and purewick was placed on the pt.

## 2021-09-07 NOTE — ED Provider Notes (Signed)
[facility] EMERGENCY DEPARTMENT Provider Note   CSN: [id number] Arrival date & time: 09/06/21  1934     History Chief Complaint  Patient presents with   Shortness of Breath    Sherri Hart is a 75 y.o. female.   Shortness of Breath Severity:  Mild Onset quality:  Gradual Timing:  Constant Progression:  Worsening Chronicity:  New Context: not activity, not occupational exposure and not pollens   Relieved by:  None tried Worsened by:  Nothing Ineffective treatments:  None tried Associated symptoms: cough and fever   Associated symptoms: no abdominal pain       Past Medical History:  Diagnosis Date   Anxiety    Arthritis    Atrial fibrillation (HCC)    Complication of anesthesia    used to get migraine headaches after surgery   Congestive heart failure (HCC)    COPD (chronic obstructive pulmonary disease) (HCC)    Dyspnea    if afib causes a problem   Pneumonia     Patient Active Problem List   Diagnosis Date Noted   Pressure injury of skin 07/01/2021   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   Status post revision of total knee replacement, 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   Atrial fibrillation with RVR ([place]) 0000000   Acute diastolic (congestive) heart failure ([place]) 07/05/2020   Elevated troponin level not due myocardial infarction 07/04/2020   Hypomagnesemia 07/04/2020   Atrial fibrillation with rapid ventricular response ([place]) 07/04/2020   Acute cardiogenic pulmonary edema ([place]) 07/04/2020   COPD with acute exacerbation ([place]) 07/04/2020   Nicotine dependence, cigarettes, uncomplicated [id number]    Past Surgical History:  Procedure Laterality Date   BACK SURGERY     CHOLECYSTECTOMY     NECK SURGERY     REPLACEMENT TOTAL KNEE Right    TOTAL KNEE REVISION Right 06/27/2021   Procedure: TOTAL KNEE REVISION;  Surgeon: Gaynelle Arabian, MD;  Location: [facility] ORS;  Service: Orthopedics;  Laterality: Right;   TUBAL LIGATION       OB  History   No obstetric history on file.     Family History  Problem Relation Age of Onset   COPD Other     Social History   Tobacco Use   Smoking status: Every Day    Packs/day: 0.50    Years: 53.00    Pack years: 26.50    Types: Cigarettes   Smokeless tobacco: Never  Vaping Use   Vaping Use: Never used  Substance Use Topics   Alcohol use: Never   Drug use: Never    Home Medications Prior to Admission medications   Medication Sig Start Date End Date Taking? Authorizing Provider  albuterol (VENTOLIN HFA) 108 (90 Base) MCG/ACT inhaler Inhale 2 puffs into the lungs every 6 (six) hours as needed for wheezing or shortness of breath. 02/04/21   Mercy Riding, MD  diltiazem (CARDIZEM CD) 180 MG 24 hr capsule Take 1 capsule (180 mg total) by mouth daily. 02/04/21   Mercy Riding, MD  doxycycline (VIBRAMYCIN) 100 MG capsule Take 1 capsule (100 mg total) by mouth 2 (two) times daily. 09/04/21   Sherrell Puller, PA-C  furosemide (LASIX) 40 MG tablet Take 1 tablet (40 mg total) by mouth daily. 02/04/21   Mercy Riding, MD  loratadine (CLARITIN) 10 MG tablet Take 10 mg by mouth daily as needed for allergies.    [provider]  methocarbamol (ROBAXIN) 500 MG tablet Take 1 tablet (500 mg total)  by mouth every 6 (six) hours as needed for muscle spasms. Patient not taking: Reported on 09/04/2021 07/04/21   Fenton Foy D, PA-C  methylPREDNISolone (MEDROL DOSEPAK) 4 MG TBPK tablet Take by mouth daily for 6 days. Taper over 6 days 09/04/21 09/10/21  Sherrell Puller, PA-C  oxyCODONE (OXY IR/ROXICODONE) 5 MG immediate release tablet Take 1-2 tablets (5-10 mg total) by mouth every 6 (six) hours as needed for severe pain. 07/04/21   Fenton Foy D, PA-C  rivaroxaban (XARELTO) 20 MG TABS tablet Take 1 tablet (20 mg total) by mouth daily with supper. 07/04/21   Fenton Foy D, PA-C  rivaroxaban (XARELTO) 20 MG TABS tablet TAKE 1 TABLET(20 MG) BY MOUTH DAILY WITH SUPPER Patient not taking:  Reported on 09/04/2021 07/30/21   Almyra Deforest, PA  tiotropium (SPIRIVA) 18 MCG inhalation capsule Place 1 capsule (18 mcg total) into inhaler and inhale daily. 09/04/21   Sherrell Puller, PA-C  traMADol (ULTRAM) 50 MG tablet Take 1-2 tablets (50-100 mg total) by mouth every 6 (six) hours as needed for moderate pain. Patient not taking: Reported on 09/04/2021 07/04/21   Jonnie Kind, PA-C    Allergies    Penicillins  Review of Systems   Review of Systems  Constitutional:  Positive for fever.  Respiratory:  Positive for cough and shortness of breath.   Gastrointestinal:  Negative for abdominal pain.  All other systems reviewed and are negative.  Physical Exam Updated Vital Signs BP (!) 142/89    Pulse (!) 102    Temp 98.1 F (36.7 C) (Oral)    Resp (!) 22    SpO2 96%   Physical Exam Vitals and nursing note reviewed.  Constitutional:      Appearance: She is well-developed.  HENT:     Head: Normocephalic and atraumatic.  Cardiovascular:     Rate and Rhythm: Normal rate and regular rhythm.  Pulmonary:     Effort: Tachypnea present. No respiratory distress.     Breath sounds: No stridor. Decreased breath sounds and wheezing present.  Abdominal:     General: There is no distension.  Musculoskeletal:     Cervical back: Normal range of motion.  Skin:    General: Skin is warm and dry.  Neurological:     General: No focal deficit present.     Mental Status: She is alert.    ED Results / Procedures / Treatments   Labs (all labs ordered are listed, but only abnormal results are displayed) Labs Reviewed  BASIC METABOLIC PANEL - Abnormal; Notable for the following components:      Result Value   Creatinine, Ser 1.03 (*)    GFR, Estimated 57 (*)    All other components within normal limits  CBC WITH DIFFERENTIAL/PLATELET - Abnormal; Notable for the following components:   HCT 46.2 (*)    All other components within normal limits  LIPASE, BLOOD    EKG None  Radiology DG  Chest 2 View  Result Date: 09/06/2021 CLINICAL DATA:  Shortness of breath. EXAM: CHEST - 2 VIEW COMPARISON:  CTA chest and portable chest both 09/04/2021 FINDINGS: Heart is moderately enlarged. No vascular congestion is seen. The aorta is tortuous and ectatic with patchy calcifications, stable mediastinal configuration. There is increased opacity in the right middle lobe distribution concerning for pneumonia or aspiration. Rest of the lungs clear with COPD change. No pleural effusion is seen. Osteopenia and slight thoracic levoscoliosis. Thoracic spondylosis. IMPRESSION: Increased opacity right middle lobe distribution, concern for pneumonia  or aspiration. Follow-up recommended. COPD. Cardiomegaly . Electronically Signed   By: Almira Bar M.D.   On: 09/06/2021 20:49    Procedures Procedures   Medications Ordered in ED Medications - No data to display  ED Course  I have reviewed the triage vital signs and the nursing notes.  Pertinent labs & imaging results that were available during my care of the patient were reviewed by me and considered in my medical decision making (see chart for details).    MDM Rules/Calculators/A&P                         Tested for covid the other day and negative. Has persistent cough, wheezing and tachypnea, will admit.   Final Clinical Impression(s) / ED Diagnoses Final diagnoses:  None    Rx / DC Orders ED Discharge Orders     None        Jaleisa Brose, Barbara Cower, MD 09/07/21 [phone]

## 2021-09-08 DIAGNOSIS — Z96651 Presence of right artificial knee joint: Secondary | ICD-10-CM | POA: Diagnosis present

## 2021-09-08 DIAGNOSIS — M25561 Pain in right knee: Secondary | ICD-10-CM | POA: Diagnosis present

## 2021-09-08 DIAGNOSIS — J441 Chronic obstructive pulmonary disease with (acute) exacerbation: Principal | ICD-10-CM

## 2021-09-08 DIAGNOSIS — B9729 Other coronavirus as the cause of diseases classified elsewhere: Secondary | ICD-10-CM | POA: Diagnosis present

## 2021-09-08 DIAGNOSIS — J1289 Other viral pneumonia: Secondary | ICD-10-CM | POA: Diagnosis present

## 2021-09-08 DIAGNOSIS — J440 Chronic obstructive pulmonary disease with acute lower respiratory infection: Secondary | ICD-10-CM | POA: Diagnosis present

## 2021-09-08 DIAGNOSIS — F17210 Nicotine dependence, cigarettes, uncomplicated: Secondary | ICD-10-CM | POA: Diagnosis present

## 2021-09-08 DIAGNOSIS — I5032 Chronic diastolic (congestive) heart failure: Secondary | ICD-10-CM | POA: Diagnosis present

## 2021-09-08 DIAGNOSIS — Z20822 Contact with and (suspected) exposure to covid-19: Secondary | ICD-10-CM | POA: Diagnosis present

## 2021-09-08 DIAGNOSIS — Z7951 Long term (current) use of inhaled steroids: Secondary | ICD-10-CM | POA: Diagnosis not present

## 2021-09-08 DIAGNOSIS — Z7901 Long term (current) use of anticoagulants: Secondary | ICD-10-CM | POA: Diagnosis not present

## 2021-09-08 DIAGNOSIS — Z9981 Dependence on supplemental oxygen: Secondary | ICD-10-CM | POA: Diagnosis not present

## 2021-09-08 DIAGNOSIS — Z8701 Personal history of pneumonia (recurrent): Secondary | ICD-10-CM | POA: Diagnosis not present

## 2021-09-08 DIAGNOSIS — Z9851 Tubal ligation status: Secondary | ICD-10-CM | POA: Diagnosis not present

## 2021-09-08 DIAGNOSIS — Z880 Allergy status to penicillin: Secondary | ICD-10-CM | POA: Diagnosis not present

## 2021-09-08 DIAGNOSIS — I4891 Unspecified atrial fibrillation: Secondary | ICD-10-CM | POA: Diagnosis present

## 2021-09-08 DIAGNOSIS — M1990 Unspecified osteoarthritis, unspecified site: Secondary | ICD-10-CM | POA: Diagnosis present

## 2021-09-08 DIAGNOSIS — J9611 Chronic respiratory failure with hypoxia: Secondary | ICD-10-CM | POA: Diagnosis present

## 2021-09-08 DIAGNOSIS — Z79899 Other long term (current) drug therapy: Secondary | ICD-10-CM | POA: Diagnosis not present

## 2021-09-08 DIAGNOSIS — Z825 Family history of asthma and other chronic lower respiratory diseases: Secondary | ICD-10-CM | POA: Diagnosis not present

## 2021-09-08 LAB — RESPIRATORY PANEL BY PCR

## 2021-09-08 LAB — CBC
HCT: 46 % (ref 36.0–46.0)
Hemoglobin: 14.4 g/dL (ref 12.0–15.0)
MCH: 30.7 pg (ref 26.0–34.0)
MCHC: 31.3 g/dL (ref 30.0–36.0)
MCV: 98.1 fL (ref 80.0–100.0)
Platelets: 177 10*3/uL (ref 150–400)
RBC: 4.69 MIL/uL (ref 3.87–5.11)
RDW: 15.3 % (ref 11.5–15.5)
WBC: 7.1 10*3/uL (ref 4.0–10.5)
nRBC: 0 % (ref 0.0–0.2)

## 2021-09-08 LAB — COMPREHENSIVE METABOLIC PANEL
ALT: 10 U/L (ref 0–44)
AST: 15 U/L (ref 15–41)
Albumin: 3.7 g/dL (ref 3.5–5.0)
Alkaline Phosphatase: 92 U/L (ref 38–126)
Anion gap: 12 (ref 5–15)
BUN: 20 mg/dL (ref 8–23)
CO2: 27 mmol/L (ref 22–32)
Calcium: 9 mg/dL (ref 8.9–10.3)
Chloride: 97 mmol/L — ABNORMAL LOW (ref 98–111)
Creatinine, Ser: 1.13 mg/dL — ABNORMAL HIGH (ref 0.44–1.00)
GFR, Estimated: 51 mL/min — ABNORMAL LOW (ref >60)
Glucose, Bld: 125 mg/dL — ABNORMAL HIGH (ref 70–99)
Potassium: 3.7 mmol/L (ref 3.5–5.1)
Sodium: 136 mmol/L (ref 135–145)
Total Bilirubin: 0.6 mg/dL (ref 0.3–1.2)
Total Protein: 7.2 g/dL (ref 6.5–8.1)

## 2021-09-08 LAB — TROPONIN I (HIGH SENSITIVITY): Troponin I (High Sensitivity): 13 ng/L (ref <18)

## 2021-09-08 LAB — HEMOGLOBIN A1C
Hgb A1c MFr Bld: 5 % (ref 4.8–5.6)
Mean Plasma Glucose: 96.8 mg/dL

## 2021-09-08 LAB — TSH: TSH: 0.677 u[IU]/mL (ref 0.350–4.500)

## 2021-09-08 NOTE — Evaluation (Signed)
Physical Therapy Evaluation Patient Details Name: Sherri Hart MRN: [id number] DOB: 10/25/1945 Today's Date: 09/08/2021  History of Present Illness  75 yo female admitted with SOB requiring 4L O2. PMH COPD R TKA CHF Afib with RVR back surg  Clinical Impression  Pt admitted with above.  Pt with noted wheezing and SOB however suspect this is near baseline. SPO2 >92% on 2lo2 via [place] t/o PT session. Pt amb more than typical for herself. Pt will need to be able to complete stair negotiation prior to d/c home. Acute PT to cont to follow.     Recommendations for follow up therapy are one component of a multi-disciplinary discharge planning process, led by the attending physician.  Recommendations may be updated based on patient status, additional functional criteria and insurance authorization.  Follow Up Recommendations No PT follow up    Assistance Recommended at Discharge Intermittent Supervision/Assistance  Functional Status Assessment Patient has had a recent decline in their functional status and demonstrates the ability to make significant improvements in function in a reasonable and predictable amount of time.  Equipment Recommendations  None recommended by PT    Recommendations for Other Services       Precautions / Restrictions Precautions Precautions: Fall Precaution Comments: watch O2 Restrictions Weight Bearing Restrictions: No      Mobility  Bed Mobility Overal bed mobility: Independent                  Transfers Overall transfer level: Needs assistance   Transfers: Sit to/from Stand;Bed to chair/wheelchair/BSC Sit to Stand: Supervision   Step pivot transfers: Min guard       General transfer comment: pt requiring min guard for std pvt to BSC due to pt not using walker and unsteady    Ambulation/Gait Ambulation/Gait assistance: Min guard Gait Distance (Feet): 100 Feet Assistive device: Rolling walker (2 wheels) Gait Pattern/deviations:  Step-through pattern;Decreased stride length;Trunk flexed Gait velocity: dec     General Gait Details: not SOB, SPO2 >92% on 2Lo2 via [place], HR up to 132 bpm, pt with progressive trunk flexion and increased dependency on UEs  Stairs            Wheelchair Mobility    Modified Rankin (Stroke Patients Only)       Balance Overall balance assessment: Needs assistance Sitting-balance support: Feet supported;No upper extremity supported Sitting balance-Leahy Scale: Good     Standing balance support: Single extremity supported;During functional activity Standing balance-Leahy Scale: Fair Standing balance comment: requires external support                             Pertinent Vitals/Pain Pain Assessment: No/denies pain    Home Living Family/patient expects to be discharged to:: Private residence Living Arrangements: Children;Other relatives Available Help at Discharge: Family;Available 24 hours/day Type of Home: Apartment Home Access: Stairs to enter Entrance Stairs-Rails: Left Entrance Stairs-Number of Steps: 6   Home Layout: One level Home Equipment: Agricultural consultant (2 wheels);BSC/3in1;Rollator (4 wheels);Grab bars - tub/shower Additional Comments: pt lives with her grandaughter and her great grandson Engineer, petroleum) dog name luna    Prior Function Prior Level of Function : Needs assist             Mobility Comments: uses RW for amb, on 2LO2 at baseline ADLs Comments: sits on a cooler in the shower     Hand Dominance   Dominant Hand: Left    Extremity/Trunk Assessment   Upper Extremity  Assessment Upper Extremity Assessment: Overall WFL for tasks assessed    Lower Extremity Assessment Lower Extremity Assessment: Generalized weakness    Cervical / Trunk Assessment Cervical / Trunk Assessment: Back Surgery (old)  Communication   Communication: No difficulties  Cognition Arousal/Alertness: Awake/alert Behavior During Therapy: WFL for tasks  assessed/performed Overall Cognitive Status: Within Functional Limits for tasks assessed                                          General Comments General comments (skin integrity, edema, etc.): SpO2 >92% on 2Lo2 via [place]    Exercises     Assessment/Plan    PT Assessment Patient needs continued PT services  PT Problem List Decreased strength;Decreased balance;Decreased activity tolerance;Decreased mobility       PT Treatment Interventions DME instruction;Gait training;Stair training;Functional mobility training;Therapeutic activities;Therapeutic exercise    PT Goals (Current goals can be found in the Care Plan section)  Acute Rehab PT Goals Patient Stated Goal: home PT Goal Formulation: With patient Time For Goal Achievement: 09/21/21 Potential to Achieve Goals: Good    Frequency Min 3X/week   Barriers to discharge        Co-evaluation               AM-PAC PT "6 Clicks" Mobility  Outcome Measure Help needed turning from your back to your side while in a flat bed without using bedrails?: None Help needed moving from lying on your back to sitting on the side of a flat bed without using bedrails?: None Help needed moving to and from a bed to a chair (including a wheelchair)?: A Little Help needed standing up from a chair using your arms (e.g., wheelchair or bedside chair)?: A Little Help needed to walk in hospital room?: A Little Help needed climbing 3-5 steps with a railing? : A Little 6 Click Score: 20    End of Session Equipment Utilized During Treatment: Oxygen Activity Tolerance: Patient tolerated treatment well Patient left: in bed;with call bell/phone within reach (EEG tech present to set up for test) Nurse Communication: Mobility status PT Visit Diagnosis: Unsteadiness on feet (R26.81);Muscle weakness (generalized) (M62.81);Difficulty in walking, not elsewhere classified (R26.2)    Time: 1005-1030 PT Time Calculation (min) (ACUTE ONLY):  25 min   Charges:   PT Evaluation $PT Eval Moderate Complexity: 1 Mod PT Treatments $Gait Training: 8-22 mins        Lewis Shock, PT, DPT Acute Rehabilitation Services Pager #: [phone] Office #: [phone]   Iona Hansen 09/08/2021, 3:37 PM

## 2021-09-08 NOTE — Plan of Care (Signed)
°  Problem: Education: Goal: Knowledge of disease or condition will improve Outcome: Progressing Goal: Knowledge of the prescribed therapeutic regimen will improve Outcome: Progressing Goal: Individualized Educational Video(s) Outcome: Progressing   Problem: Activity: Goal: Ability to tolerate increased activity will improve Outcome: Progressing Goal: Will verbalize the importance of balancing activity with adequate rest periods Outcome: Progressing   Problem: Respiratory: Goal: Ability to maintain a clear airway will improve Outcome: Progressing Goal: Levels of oxygenation will improve Outcome: Progressing Goal: Ability to maintain adequate ventilation will improve Outcome: Progressing   Problem: Activity: Goal: Ability to tolerate increased activity will improve Outcome: Progressing   Problem: Clinical Measurements: Goal: Ability to maintain a body temperature in the normal range will improve Outcome: Progressing   Problem: Respiratory: Goal: Ability to maintain adequate ventilation will improve Outcome: Progressing Goal: Ability to maintain a clear airway will improve Outcome: Progressing   Problem: Education: Goal: Knowledge of General Education information will improve Description: Including pain rating scale, medication(s)/side effects and non-pharmacologic comfort measures Outcome: Progressing   Problem: Health Behavior/Discharge Planning: Goal: Ability to manage health-related needs will improve Outcome: Progressing   Problem: Clinical Measurements: Goal: Ability to maintain clinical measurements within normal limits will improve Outcome: Progressing Goal: Will remain free from infection Outcome: Progressing Goal: Diagnostic test results will improve Outcome: Progressing Goal: Respiratory complications will improve Outcome: Progressing Goal: Cardiovascular complication will be avoided Outcome: Progressing   Problem: Activity: Goal: Risk for activity  intolerance will decrease Outcome: Progressing   Problem: Nutrition: Goal: Adequate nutrition will be maintained Outcome: Progressing   Problem: Pain Managment: Goal: General experience of comfort will improve Outcome: Progressing   Problem: Safety: Goal: Ability to remain free from injury will improve Outcome: Progressing   Problem: Skin Integrity: Goal: Risk for impaired skin integrity will decrease Outcome: Progressing

## 2021-09-08 NOTE — Progress Notes (Signed)
°  Transition of Care [facility]) Screening Note   Patient Details  Name: Sherri Hart Date of Birth: 05-Oct-1945   [facility]) CM/SW Contact:    Bess Kinds, RN Phone Number: [phone] 09/08/2021, 4:45 PM    Transition of Care Department [facility]) has reviewed patient and no TOC needs have been identified at this time. We will continue to monitor patient advancement through interdisciplinary progression rounds. If new patient transition needs arise, please place a TOC consult.

## 2021-09-08 NOTE — Progress Notes (Signed)
°  Date: 09/08/2021  Patient name: Sherri Hart Block  Medical record number: [id number]  Date of birth: August 30, 1946   I have seen and evaluated Livingston Diones and discussed their care with the Residency Team. Briefly, Ms. Block is a 75 year old woman with PMH of COPD, chronic respiratory failure on oxygen who presented with SOB and cough.  She was admitted and started on treatment for CAP and COPD exacerbation.  Her breathing is improving today and wheezing is improved.  She also was found to have some nonspecific ST and T wave changes on EKG.  She noted left sided chest pain which feels like someone is sitting on the chest, but also sharp.  This is also reproducible.  She has a history of Afib and is on eliquis at baseline.   PMHx, Fam Hx, and/or Soc Hx : She is oxygen, but only taking albuterol for her COPD.  Recently moved from [place] and is living with granddaughter her in [place].  FH of COPD  Vitals:   09/08/21 0737 09/08/21 0757  BP: 133/73   Pulse: (!) 49   Resp: 17   Temp: (!) 97.5 F (36.4 C)   SpO2: 94% 95%   Gen: Elderly woman, lying in bed, appears ill, wearing oxygen HENT: Dry MM CV: Mild tachycardia (now lower), irreg irreg, no murmur, trace edema in the LE extremities.  Warm extremities. TTP over left chest and under right breast. Pulm: Increased expiratory phase, minimal wheezing throughout, decreased breath sounds throughout, no rales Abd: Soft, NT, ND, +BS MSK: Normal tone and bulk for age Skin: Midline scar on the right knee, otherwise with chronic skin changes related to age.  Psych: Anxious  Assessment and Plan: I have seen and evaluated the patient as outlined above. I agree with the formulated Assessment and Plan as detailed in the residents' note, with the following changes:   1. COPD exacerbation with overlying CAP in the RML - Continue antibiotics - Continue inhalers - Spiriva started - Monitor for improvement - Continue prednisone - Continue oxygen  2.  Chest pain and non specific changes on EKG - Troponin ordered for further work up, would get EKG in the AM as well to monitor for changes.  - EKG today showed Afib/Flutter with T wave inversions in the Anterior and lateral leads, troponin pending  Other issues per Dr. Diamantina [place] daily note.   Inez Catalina, MD 12/31/202211:45 AM

## 2021-09-08 NOTE — Progress Notes (Signed)
HD#0 SUBJECTIVE:  Patient Summary: Sherri Hart is a 75 y.o. female with a pertinent PMH of chronic hypoxic respiratory failure 2/2 COPD on 2L oxygen at baseline, chronic diastolic heart failure, atrial fibrillation on Eliquis, osteoarthritis with recent knee replacement on 10/19, who presents to [facility] with shortness of breath and cough that have progressed over the last 2 daysand admitted for COPD exacerbation and CAP.   Overnight Events: Patient was unable to sleep but otherwise no events overnight.  Interim History: Patient reports that she was not able to sleep nearly at all overnight due to pain. She continues to have pain in her R knee, under her ribs bilaterally, to her abdomen, and endorses new chest pain that she describes as tender to palpation as well as a pressure-like sensation "like someone is sitting on your chest" with occasional sharpness mainly over her L chest.  OBJECTIVE:  Vital Signs: Vitals:   09/08/21 0444 09/08/21 0500 09/08/21 0737 09/08/21 0757  BP: 120/62  133/73   Pulse: 97  (!) 49   Resp: 20  17   Temp: 97.9 F (36.6 C)  (!) 97.5 F (36.4 C)   TempSrc: Oral  Oral   SpO2:   94% 95%  Weight:  101.9 kg    Height:       Supplemental O2: Nasal Cannula SpO2: 95 % O2 Flow Rate (L/min): 2 L/min  Filed Weights   09/08/21 0052 09/08/21 0500  Weight: 100.6 kg 101.9 kg     Intake/Output Summary (Last 24 hours) at 09/08/2021 0915 Last data filed at 09/07/2021 1053 Gross per 24 hour  Intake 321.93 ml  Output --  Net 321.93 ml   Net IO Since Admission: 321.93 mL [09/08/21 0915]  Physical Exam: Constitutional: Chronically ill appearing female, no acute distress. Cardio: Regular rate with irregularly irregular rhythm. No murmurs, rubs, gallops. Pulm: Clear to auscultation. Airflow is improved from 12/30. Patient is not in respiratory distress, is able to speak in full sentences without tripoding, no pursed lips. Abdomen: Soft, nontender,  nondistended. MSK: Bilateral nonpitting edema to LE. Skin: Skin is warm and dry. Neuro: Alert and oriented x3. No focal deficit noted. Psych: Normal mood and affect.  Patient Lines/Drains/Airways Status     Active Line/Drains/Airways     Name Placement date Placement time Site Days   Peripheral IV 09/07/21 20 G Left;Posterior Forearm 09/07/21  --  Forearm  1   External Urinary Catheter 06/28/21  2148  --  72   Incision (Closed) 06/27/21 Knee Right 06/27/21  1309  -- 73   Pressure Injury 07/01/21 Pretibial Right;Lateral Stage 1 -  Intact skin with non-blanchable redness of a localized area usually over a bony prominence. Thin red nonblanchable line from ice pack 07/01/21  0800  -- 69             ASSESSMENT/PLAN:  Assessment: Principal Problem:   COPD exacerbation (HCC)   Plan: Sherri Hart is a 75 y.o. female with a pertinent PMH of chronic hypoxic respiratory failure 2/2 COPD on 2L oxygen at baseline, chronic diastolic heart failure, atrial fibrillation on Eliquis, osteoarthritis with recent knee replacement on 10/19, who presents to [facility] with shortness of breath and cough that have progressed over the last 2 days.on hospital day 0.   Plan: #COPD exacerbation At baseline patient is on 2 L nasal cannula as needed at home, as well as an as needed albuterol inhaler.  She reports that the breathing treatments she has received in the  hospital have helped with her breathing some, but that she does still have progressive discomfort. She does feel that she is doing a little better from a breathing standpoint today. She is trying to cough up mucus and clear her chest out. Newly concerning however is acute chest pain associated with EKG finding of anterior lead T wave inversion. This is a new finding since admission. -Supplemental oxygen, SpO2 goal >90% -DuoNeb every 6 hours -Anoro Ellipta 1 puff daily -Prednisone 40 mg daily -High sensitivity troponin ordered.    #Community-acquired pneumonia Chest x-ray 12/30 showed worsening opacification of the right middle lobe compared to chest x-ray from 12/27, concerning for pneumonia or aspiration.  She is negative for COVID-19 and the flu. Legionella and strep pneumo pending. -Zithromax 500 mg IV daily, Rocephin 1 g IV daily -Mucinex 600 mg twice daily -Legionella, strep pneumonia labs ordered   #Atrial fibrillation Patient reports that she has been on Xarelto 20 mg daily for about the last month, and prior to that she was on Eliquis for about a year and a half however due to price of the medication she switched.  On exam she is in atrial fibrillation with intermittent RVR. -Diltiazem 180 mg daily -Continue home Xarelto 20 mg daily   #Chronic diastolic heart failure Patient reports chronic bilateral lower extremity edema for which she takes Lasix 40 mg daily.  Last echocardiogram was from 06/2020 and noted LV EF of 55 to 60% with normal function and no regional wall motion abnormalities, mild concentric LV hypertrophy; RV systolic function mildly reduced with enlarged RV; mildly elevated pulmonary artery systolic pressure; severely dilated bilateral atria; small pericardial effusion.  Repeat echocardiogram 12/30 showed LV EF of 60-65% with normal function, no regional wall motion abnormalities, and mild concentric LV hypertrophy; mildly reduced RV systolic function, RV moderately enlarged, moderately elevated PA systolic pressure; severely dilated bilateral atria. Patient is clinically euvolemic. -Continue home Lasix 40 mg daily   #Right knee replacement Patient is S/P right knee replacement about 1 month ago.  She reports that she works frequently with physical therapy, and that she does still have moderate to severe pain even at rest. -Tylenol 650 mg every 6 hours as needed mild pain or fever -Oxycodone 5-10 mg every 6 hours as needed severe pain -PT consult placed   #Smoker Patient smokes tobacco, however  she has not smoked in 7 days. -Nicotine patch  Best Practice: Diet: Cardiac diet IVF: Fluids: IV push only, no IV fluids,  VTE: rivaroxaban (XARELTO) tablet 20 mg Start: 09/07/21 1700 Code: Full AB: Rocephin 1 g IV daily, Zithromax 500 mg IV daily Status: Observation with expected length of stay less than 2 midnights. Anticipated Discharge Location: Home Barriers to Discharge: Medical stability  Signature: Farrel Gordon, D.O.  Internal Medicine Resident, PGY-1 [facility]  Pager: [phone] 9:15 AM, 09/08/2021   Please contact the on call pager after 5 pm and on weekends at [phone].

## 2021-09-09 MED ORDER — AZITHROMYCIN 500 MG PO TABS: 500.0000 mg | ORAL_TABLET | Freq: Every day | ORAL | Status: DC

## 2021-09-09 MED ORDER — SODIUM CHLORIDE 0.9 % IV SOLN: 2.0000 g | INTRAVENOUS | Status: DC

## 2021-09-09 NOTE — Progress Notes (Signed)
Physical Therapy Treatment Patient Details Name: Sherri Hart MRN: [id number] DOB: 1945-09-15 Today's Date: 09/09/2021   History of Present Illness 76 yo female admitted with SOB requiring 4L O2. PMH COPD R TKA CHF Afib with RVR back surg    PT Comments    Continuing work on functional mobility and activity tolerance;  Session focused on progressive amb, and monitoring for need for supplemental O2; Pt initially quite stressed upon beginning of PT session, and voiced frustration at confusion around going home today vs tomorrow; Provided active listening and encouragement, and pt eventually requested to walk the hallways;   Cues to self-monitor fro activity tolerance while walking; occasionally with noted DOE, and O2 sats got as low as 85% (observed lowest), but pt able to stop and take deep breaths and O2 sats would incr back to 90% without the need for supplemental O2; this happened twice while ambulating in the hallway  Recommendations for follow up therapy are one component of a multi-disciplinary discharge planning process, led by the attending physician.  Recommendations may be updated based on patient status, additional functional criteria and insurance authorization.  Follow Up Recommendations  No PT follow up     Assistance Recommended at Discharge Intermittent Supervision/Assistance  Equipment Recommendations  None recommended by PT;Other (comment) (shower seat)    Recommendations for Other Services       Precautions / Restrictions Precautions Precautions: Fall Precaution Comments: Fall risk reduced with use of RW; watch O2     Mobility  Bed Mobility Overal bed mobility: Independent                  Transfers   Equipment used: Rolling walker (2 wheels) Transfers: Sit to/from Stand Sit to Stand: Supervision           General transfer comment: pushed up from bed with R hand    Ambulation/Gait Ambulation/Gait assistance: Min guard Gait Distance  (Feet): 150 Feet Assistive device: Rolling walker (2 wheels) Gait Pattern/deviations: Step-through pattern;Decreased stride length;Trunk flexed       General Gait Details: Cues to self-monitor fro activity tolerance; occasionally with noted DOE, and O2 sats got as low as 85% (observed lowest), but pt able to stop and take deep breaths and O2 sats would incr back to 90%; this happened twice while ambulating in the hallway   Stairs         General stair comments: We discussed stairs, and pt described teh step-by-step technique she uses while holding on to the rail on the L; she described a safe technqiue; declined practicing stairs when offered   Wheelchair Mobility    Modified Rankin (Stroke Patients Only)       Balance     Sitting balance-Leahy Scale: Good       Standing balance-Leahy Scale: Fair                              Cognition Arousal/Alertness: Awake/alert Behavior During Therapy: WFL for tasks assessed/performed (Initially quite emotional and frustrated with confusion around when she will be  going home) Overall Cognitive Status: Within Functional Limits for tasks assessed                                 General Comments: Crying initially, and emotional; Agreeable to gettingup and walking once she got pain meds        Exercises  General Comments General comments (skin integrity, edema, etc.): Walked on room air with 2 noted episodes of decr O2 sats into the 80s; Able to stop and focus on breathing and sats incr back to 90% without the need for supplemental O2      Pertinent Vitals/Pain Pain Assessment: Faces Faces Pain Scale: Hurts even more Pain Location: chest Pain Descriptors / Indicators: Jabbing;Discomfort;Crying Pain Intervention(s): RN gave pain meds during session    Home Living                          Prior Function            PT Goals (current goals can now be found in the care plan section)  Acute Rehab PT Goals Patient Stated Goal: To be able to manage at home PT Goal Formulation: With patient Time For Goal Achievement: 09/21/21 Potential to Achieve Goals: Good Progress towards PT goals: Progressing toward goals    Frequency    Min 3X/week      PT Plan Current plan remains appropriate;Equipment recommendations need to be updated    Co-evaluation              AM-PAC PT "6 Clicks" Mobility   Outcome Measure  Help needed turning from your back to your side while in a flat bed without using bedrails?: None Help needed moving from lying on your back to sitting on the side of a flat bed without using bedrails?: None Help needed moving to and from a bed to a chair (including a wheelchair)?: A Little Help needed standing up from a chair using your arms (e.g., wheelchair or bedside chair)?: A Little Help needed to walk in hospital room?: A Little Help needed climbing 3-5 steps with a railing? : A Little 6 Click Score: 20    End of Session Equipment Utilized During Treatment: Gait belt Activity Tolerance: Patient tolerated treatment well Patient left: in bed;with call bell/phone within reach Nurse Communication: Mobility status PT Visit Diagnosis: Unsteadiness on feet (R26.81);Muscle weakness (generalized) (M62.81);Difficulty in walking, not elsewhere classified (R26.2)     Time: 1641-1730 PT Time Calculation (min) (ACUTE ONLY): 49 min  Charges:  $Gait Training: 23-37 mins $Therapeutic Activity: 8-22 mins                     Van Clines, PT  Acute Rehabilitation Services Pager [phone] Office [phone]    Sherri Hart 09/09/2021, 6:09 PM

## 2021-09-09 NOTE — Progress Notes (Signed)
HD#1 SUBJECTIVE:  Patient Summary: Sherri Hart is a 76 y.o. female with a pertinent PMH of chronic hypoxic respiratory failure 2/2 COPD on 2L oxygen at baseline, chronic diastolic heart failure, atrial fibrillation on Xarelto, osteoarthritis with recent knee replacement on 10/19, who presents to [facility] with shortness of breath and cough that have progressed over the last 2 daysand admitted for COPD exacerbation and CAP.   Overnight Events: High-sensitivity troponin checked secondary to patient reporting acute chest pain and new EKG finding of anterior lead T wave inversion.  It was negative.  Interim History: Patient continues to struggle with sleep but states that she feels like she is starting to get better.  She is appreciative of the Mucinex and feels it is helping her loosen up and clear her chest congestion.  She continues to have pain in her abdomen and over her ribs, attributed to her coughing.  OBJECTIVE:  Vital Signs: Vitals:   09/09/21 0412 09/09/21 0500 09/09/21 0751 09/09/21 0817  BP: 126/74  106/83   Pulse: 100  (!) 101   Resp: 20  18   Temp: 97.7 F (36.5 C)  97.9 F (36.6 C)   TempSrc: Oral  Oral   SpO2: 96%  95% 96%  Weight:  102.1 kg    Height:       Supplemental O2: Nasal Cannula SpO2: 96 % O2 Flow Rate (L/min): 2 L/min  Filed Weights   09/08/21 0052 09/08/21 0500 09/09/21 0500  Weight: 100.6 kg 101.9 kg 102.1 kg     Intake/Output Summary (Last 24 hours) at 09/09/2021 0909 Last data filed at 09/09/2021 0841 Gross per 24 hour  Intake 120 ml  Output --  Net 120 ml   Net IO Since Admission: 441.93 mL [09/09/21 0909]  Physical Exam: Constitutional: Chronically ill-appearing female, no acute distress noted. Cardio: Regular rate with irregularly irregular rhythm.  No murmurs, rubs, gallops. Pulm: Clear to auscultation bilaterally.  Patient is able to communicate in full sentences without tripoding, no pursed lips.  She feels like her breathing is  progressively getting better. Abdomen: Soft, nontender, nondistended. [id number] nonpitting edema to the lower extremities. Skin: Skin is warm and dry. Neuro: Alert and oriented x3.  No focal deficit noted. Psych: Normal mood and affect.  Patient Lines/Drains/Airways Status     Active Line/Drains/Airways     Name Placement date Placement time Site Days   Peripheral IV 09/07/21 20 G Left;Posterior Forearm 09/07/21  --  Forearm  2   External Urinary Catheter 06/28/21  2148  --  75   Incision (Closed) 06/27/21 Knee Right 06/27/21  1309  -- 76   Pressure Injury 07/01/21 Pretibial Right;Lateral Stage 1 -  Intact skin with non-blanchable redness of a localized area usually over a bony prominence. Thin red nonblanchable line from ice pack 07/01/21  0800  -- 70             ASSESSMENT/PLAN:  Assessment: Principal Problem:   COPD exacerbation ([place])   Plan: Sherri Hart is a 76 y.o. female with a pertinent PMH of chronic hypoxic respiratory failure 2/2 COPD on 2L oxygen at baseline, chronic diastolic heart failure, atrial fibrillation on Xarelto, osteoarthritis with recent knee replacement on 10/19, who presents to [facility] with shortness of breath and cough that have progressed over the last 2 days.on hospital day 0.   Plan: #COPD exacerbation At baseline patient is on 2 L nasal cannula as needed at home, as well as an as needed albuterol inhaler.  She reports continued improvement of her breathing with hospital breathing treatments.  She endorses continuing to feel better.  She states that the Mucinex has helped significantly with loosening and ridding of her chest congestion.   -Supplemental oxygen, SpO2 goal >90% -DuoNeb every 6 hours -Anoro Ellipta 1 puff daily -Prednisone 40 mg daily   #Community-acquired pneumonia Chest x-ray 12/30 showed worsening opacification of the right middle lobe compared to chest x-ray from 12/27, concerning for pneumonia or aspiration.  She is  negative for COVID-19 and the flu. Legionella and strep pneumo pending. -Zithromax 500 mg IV daily, Rocephin 1 g IV daily -Mucinex 600 mg twice daily -Legionella, strep pneumonia labs ordered   #Atrial fibrillation Patient reports that she has been on Xarelto 20 mg daily for about the last month, and prior to that she was on Eliquis for about a year and a half however due to price of the medication she switched.  On exam she is in atrial fibrillation with intermittent RVR. -Diltiazem 180 mg daily -Continue home Xarelto 20 mg daily   #Chronic diastolic heart failure Patient reports chronic bilateral lower extremity edema for which she takes Lasix 40 mg daily. Echocardiogram 12/30 showed LV EF of 60-65% with normal function, no regional wall motion abnormalities, and mild concentric LV hypertrophy; mildly reduced RV systolic function, RV moderately enlarged, moderately elevated PA systolic pressure; severely dilated bilateral atria. Patient is clinically euvolemic. -Continue home Lasix 40 mg daily   #Right knee replacement Patient is S/P right knee replacement about 1 month ago.  She reports that she works frequently with physical therapy, and that she does still have moderate to severe pain even at rest. -Tylenol 650 mg every 6 hours as needed mild pain or fever -Oxycodone 5-10 mg every 6 hours as needed severe pain -PT not recommending further follow-up   #Smoker Patient smokes tobacco, however she has not smoked in 8 days. -Nicotine patch   Best Practice: Diet: Cardiac diet IVF: Fluids: IV push only, no IV fluids,  VTE: rivaroxaban (XARELTO) tablet 20 mg Start: 09/07/21 1700 Code: Full AB: Rocephin 1 g IV daily, Zithromax 500 mg IV daily Anticipated Discharge Location: Home Barriers to Discharge: Medical stability  Signature: Farrel Gordon, D.O.  Internal Medicine Resident, PGY-1 [facility]  Pager: [phone] 9:09 AM, 09/09/2021   Please contact the  on call pager after 5 pm and on weekends at [phone].

## 2021-09-09 NOTE — Progress Notes (Signed)
Respiratory panel came back positive for Coronavirus OC43. Ok to stop azith and ceftriaxone per Dr. August Saucer.  Ulyses Southward, PharmD, BCIDP, AAHIVP, CPP Infectious Disease Pharmacist 09/09/2021 2:10 PM

## 2021-09-09 NOTE — Progress Notes (Signed)
SATURATION QUALIFICATIONS: (This note is used to comply with regulatory documentation for home oxygen)  Patient Saturations on Room Air at Rest = 91%  Patient Saturations on Room Air while Ambulating = 90%  Patient Saturations on 2 Liters of oxygen while Ambulating = 93%  Please briefly explain why patient needs home oxygen: Pt denies SOB/ discomfort at this time.

## 2021-09-09 NOTE — TOC Initial Note (Signed)
Transition of Care [facility]) - Initial/Assessment Note    Patient Details  Name: Sherri Hart MRN: [id number] Date of Birth: 10-20-1945  Transition of Care [facility]) CM/SW Contact:    Bess Kinds, RN Phone Number: [phone] 09/09/2021, 2:32 PM  Clinical Narrative:                  Acknowledging [facility] consult that patient medically stable but patient's granddaughter is out of town. Spoke with patient at the bedside. Patient concerned about having a ride home. Stated that there is a roommate so she can access the home. Granddaughter on speaker phone and stated that it is fine for patient to discharge home. Granddaughter stated that she works out of town and is currently 10 hours away. [place] waiver signed and placed in shadow chart. MD notified.   Expected Discharge Plan: Home/Self Care Barriers to Discharge: No Barriers Identified   Patient Goals and CMS Choice        Expected Discharge Plan and Services Expected Discharge Plan: Home/Self Care                                              Prior Living Arrangements/Services                       Activities of Daily Living Home Assistive Devices/Equipment: Bedside commode/3-in-1, Walker (specify type) (Front wheeled.) ADL Screening (condition at time of admission) Patient's cognitive ability adequate to safely complete daily activities?: Yes Is the patient deaf or have difficulty hearing?: No Does the patient have difficulty seeing, even when wearing glasses/contacts?: No Does the patient have difficulty concentrating, remembering, or making decisions?: No Patient able to express need for assistance with ADLs?: Yes Does the patient have difficulty dressing or bathing?: No Independently performs ADLs?: Yes (appropriate for developmental age) Does the patient have difficulty walking or climbing stairs?: No Weakness of Legs: Both Weakness of Arms/Hands: None  Permission Sought/Granted                   Emotional Assessment              Admission diagnosis:  COPD exacerbation (HCC) [J44.1] Patient Active Problem List   Diagnosis Date Noted   COPD exacerbation (HCC) 09/07/2021   Pressure injury of skin 07/01/2021   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   Status post revision of total knee replacement, 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   Atrial fibrillation with RVR (HCC) 02/01/2021   Acute diastolic (congestive) heart failure (HCC) 07/05/2020   Elevated troponin level not due myocardial infarction 07/04/2020   Hypomagnesemia 07/04/2020   Atrial fibrillation with rapid ventricular response (HCC) 07/04/2020   Acute cardiogenic pulmonary edema (HCC) 07/04/2020   COPD with acute exacerbation (HCC) 07/04/2020   Nicotine dependence, cigarettes, uncomplicated 07/04/2020   PCP:  Ellyn Hack, MD Pharmacy:   [facility][id number] [place], [place] - [address] AT [facility] OF [address] & [facility] [address] [place] [address] Phone: [phone] Fax: [phone]  [facility] #[id number] [place] Otto, [place] - [address] AT [facility] OF [address] [place] [address] Phone: [phone] Fax: [phone]     Social Determinants of Health (SDOH) Interventions    Readmission Risk Interventions No flowsheet data found.

## 2021-09-09 NOTE — Progress Notes (Signed)
PHARMACIST - PHYSICIAN COMMUNICATION  DR:   Daryll Drown  CONCERNING: IV to Oral Route Change Policy  RECOMMENDATION: This patient is receiving azithromycin by the intravenous route.  Based on criteria approved by the Pharmacy and Therapeutics Committee, the intravenous medication(s) is/are being converted to the equivalent oral dose form(s).   DESCRIPTION: These criteria include: The patient is eating (either orally or via tube) and/or has been taking other orally administered medications for a least 24 hours The patient has no evidence of active gastrointestinal bleeding or impaired GI absorption (gastrectomy, short bowel, patient on TNA or NPO).  If you have questions about this conversion, please contact the Pharmacy Department  []   [phone] )  [facility] []   [phone] )  [facility] [x]   [phone] )  [facility] []   [phone] )  [facility] []   [phone] )  [facility]

## 2021-09-09 NOTE — Discharge Instructions (Addendum)
Information on my medicine - XARELTO (Rivaroxaban)  This medication education was reviewed with me or my healthcare representative as part of my discharge preparation.  The pharmacist that spoke with me during my hospital stay was:    Why was Xarelto prescribed for you? Xarelto was prescribed for you to reduce the risk of a blood clot forming that can cause a stroke if you have a medical condition called atrial fibrillation (a type of irregular heartbeat).  What do you need to know about xarelto ? Take your Xarelto ONCE DAILY at the same time every day with your evening meal. If you have difficulty swallowing the tablet whole, you may crush it and mix in applesauce just prior to taking your dose.  Take Xarelto exactly as prescribed by your doctor and DO NOT stop taking Xarelto without talking to the doctor who prescribed the medication.  Stopping without other stroke prevention medication to take the place of Xarelto may increase your risk of developing a clot that causes a stroke.  Refill your prescription before you run out.  After discharge, you should have regular check-up appointments with your healthcare provider that is prescribing your Xarelto.  In the future your dose may need to be changed if your kidney function or weight changes by a significant amount.  What do you do if you miss a dose? If you are taking Xarelto ONCE DAILY and you miss a dose, take it as soon as you remember on the same day then continue your regularly scheduled once daily regimen the next day. Do not take two doses of Xarelto at the same time or on the same day.   Important Safety Information A possible side effect of Xarelto is bleeding. You should call your healthcare provider right away if you experience any of the following: Bleeding from an injury or your nose that does not stop. Unusual colored urine (red or dark brown) or unusual colored stools (red or black). Unusual bruising for unknown  reasons. A serious fall or if you hit your head (even if there is no bleeding).  Some medicines may interact with Xarelto and might increase your risk of bleeding while on Xarelto. To help avoid this, consult your healthcare provider or pharmacist prior to using any new prescription or non-prescription medications, including herbals, vitamins, non-steroidal anti-inflammatory drugs (NSAIDs) and supplements.  This website has more information on Xarelto: [email or web address]     Internal Medicine Doctor's Instructions:  You were seen at [facility] for COPD exacerbation.  You were also infection with coronavirus (this is a different strain of virus than COVID-19).  We think that this is why you had this COPD exacerbation.  We are discharging you on Anoro ellipta.  You will also take prednisone today and tomorrow, and then you are finished with this medication.  We also recommend that you continue taking albuterol as needed. Please follow-up with your primary care provider in the next week to discuss your recent hospitalization. If any symptoms change or worsen acutely, please return to the nearest emergency department.

## 2021-09-09 NOTE — Plan of Care (Signed)

## 2021-09-09 NOTE — TOC Progression Note (Signed)
Transition of Care [facility]) - Progression Note    Patient Details  Name: Sherri Hart MRN: [id number] Date of Birth: 1945-10-24  Transition of Care [facility]) CM/SW Contact  Bess Kinds, RN Phone Number: [phone] 09/09/2021, 3:03 PM  Clinical Narrative:     Notified by nursing that patient stated she will need transportation assistance that can assist her getting into her apartment d/t limited mobility with her right knee following her knee surgery last month.   Expected Discharge Plan: Home/Self Care Barriers to Discharge: No Barriers Identified  Expected Discharge Plan and Services Expected Discharge Plan: Home/Self Care                                               Social Determinants of Health (SDOH) Interventions    Readmission Risk Interventions No flowsheet data found.

## 2021-09-09 NOTE — Progress Notes (Addendum)
Walked test done. Pt ambulated with walker,with standby assistx1. Pt able to walked 220+ ft without complaints No oxgen. Denies any SOB/dsicomfort at this time.VSS. Per pt she won't be able to go home today for the reason that her granddaughter is not around and she'll be back tomorrow.

## 2021-09-10 DIAGNOSIS — J441 Chronic obstructive pulmonary disease with (acute) exacerbation: Secondary | ICD-10-CM | POA: Diagnosis not present

## 2021-09-10 MED ORDER — UMECLIDINIUM-VILANTEROL 62.5-25 MCG/ACT IN AEPB: 1.0000 | INHALATION_SPRAY | Freq: Every day | RESPIRATORY_TRACT | 1 refills | Status: DC

## 2021-09-10 MED ORDER — ALBUTEROL SULFATE HFA 108 (90 BASE) MCG/ACT IN AERS: 2.0000 | INHALATION_SPRAY | Freq: Four times a day (QID) | RESPIRATORY_TRACT | 2 refills | Status: AC | PRN

## 2021-09-10 MED ORDER — NICOTINE 14 MG/24HR TD PT24: 14.0000 mg | MEDICATED_PATCH | Freq: Every day | TRANSDERMAL | 0 refills | Status: DC

## 2021-09-10 MED ORDER — GUAIFENESIN ER 600 MG PO TB12: 600.0000 mg | ORAL_TABLET | Freq: Two times a day (BID) | ORAL | 0 refills | Status: AC

## 2021-09-10 MED ORDER — PREDNISONE 20 MG PO TABS: 40.0000 mg | ORAL_TABLET | Freq: Every day | ORAL | 0 refills | Status: DC

## 2021-09-10 NOTE — Discharge Summary (Signed)
Name: Sherri Hart MRN: [id number] DOB: Feb 04, 1946 76 y.o. PCP: Ellyn Hack, MD  Date of Admission: 09/06/2021  7:46 PM Date of Discharge: 09/10/2021 Attending Physician: Inez Catalina, MD  Discharge Diagnosis: 1. COPD exacerbation 2. CAP 3. Atrial fibrillation 4. Chronic diastolic HF 5. Right knee replacement   Discharge Medications: Allergies as of 09/10/2021       Reactions   Penicillins    ++ Pt stated that she can take keflex++ Did it involve swelling of the face/tongue/throat, SOB, or low BP? Yes Did it involve sudden or severe rash/hives, skin peeling, or any reaction on the inside of your mouth or nose? N Did you need to seek medical attention at a hospital or doctor's office? Y When did it last happen? childhood      If all above answers are "NO", may proceed with cephalosporin use.        Medication List     STOP taking these medications    doxycycline 100 MG capsule Commonly known as: VIBRAMYCIN   methocarbamol 500 MG tablet Commonly known as: ROBAXIN   methylPREDNISolone 4 MG Tbpk tablet Commonly known as: MEDROL DOSEPAK   tiotropium 18 MCG inhalation capsule Commonly known as: SPIRIVA   traMADol 50 MG tablet Commonly known as: ULTRAM       TAKE these medications    albuterol 108 (90 Base) MCG/ACT inhaler Commonly known as: VENTOLIN HFA Inhale 2 puffs into the lungs every 6 (six) hours as needed for wheezing or shortness of breath.   diltiazem 180 MG 24 hr capsule Commonly known as: CARDIZEM CD Take 1 capsule (180 mg total) by mouth daily.   furosemide 40 MG tablet Commonly known as: LASIX Take 1 tablet (40 mg total) by mouth daily.   guaiFENesin 600 MG 12 hr tablet Commonly known as: MUCINEX Take 1 tablet (600 mg total) by mouth 2 (two) times daily.   loratadine 10 MG tablet Commonly known as: CLARITIN Take 10 mg by mouth daily as needed for allergies.   nicotine 14 mg/24hr patch Commonly known as: NICODERM CQ -  dosed in mg/24 hours Place 1 patch (14 mg total) onto the skin daily. Start taking on: September 11, 2021   oxyCODONE 5 MG immediate release tablet Commonly known as: Oxy IR/ROXICODONE Take 1-2 tablets (5-10 mg total) by mouth every 6 (six) hours as needed for severe pain.   predniSONE 20 MG tablet Commonly known as: DELTASONE Take 2 tablets (40 mg total) by mouth daily with breakfast. Start taking on: September 11, 2021   rivaroxaban 20 MG Tabs tablet Commonly known as: XARELTO Take 1 tablet (20 mg total) by mouth daily with supper. What changed: Another medication with the same name was removed. Continue taking this medication, and follow the directions you see here.   umeclidinium-vilanterol 62.5-25 MCG/ACT Aepb Commonly known as: ANORO ELLIPTA Inhale 1 puff into the lungs daily.        Disposition and follow-up:   Ms.Sherri Hart was discharged from [facility] in Stable condition.  At the hospital follow up visit please address:  COPD exacerbation: The patient is on 2 L oxygen as needed at home as well as a rescue inhaler as needed.  On day of discharge, she was satting well on 2 L.  We have discharged her on Anoro ellipta and 2 more days of prednisone (including today).   CAP: Patient will not need further antibiotic tx, likely viral pneumonia from COVID OC43.  2.  Pending labs/ test needing follow-up: None  Hospital Course by problem list: #COPD exacerbation Patient presented to [facility] ED with persistent shortness of breath after being evaluated on 12/27 at which time she was discharged home on doxycycline 100 mg twice daily for 7 days. At baseline patient is on 2 L nasal cannula as needed at home, as well as an as needed albuterol inhaler,  however she had required more supplemental oxygen and use of her inhaler in the days prior to admission.  mMRC of 3, indicated by shortness of breath after climbing stairs and moving at a slower pace, and this is the  fourth hospital presentation of this calendar year due to respiratory concerns placing the patient in Gold group D indicating that she should be managed with a LAMA or LAMA + LABA.  On day of admission she was on day 6 without smoking.  She was initiated on DuoNeb every 6 hours, an oral Ellipta 1 puff daily, and prednisone 40 mg daily as well as supplemental oxygen.  Repeat EKG and high-sensitivity troponin were obtained on 12/31 due to complaints of chest pain with EKG showing anterior lead T wave inversion, however high-sensitivity troponin was negative.  On 1/1, patient had saturations of 91% on room air at rest, 90% on room air while ambulating, and 93% on 2 L oxygen while ambulating.  She was asymptomatic with ambulation.  On 1/2, patient was satting well on 2 L and without any complaints. She was discharged in stable condition. She will continue Anoro ellipta as well as rescue inhaler and oxygen as needed. She will continue prednisone for a total of 5 days (she is currently on day 4/5 of prednisone).   #Community-acquired pneumonia Patient was evaluated 12/27 and was started on doxycycline 100 mg twice daily for 7 days. Repeat chest x-ray at admission showed worsening opacification of the right middle lobe compared to chest x-ray from 12/27, concerning for pneumonia or aspiration.  She was negative for COVID-19 and the flu.  She was initiated on Zithromax 500 mg IV daily and Rocephin 1 g IV daily as well as Mucinex 600 mg twice daily. Patient was found to be  positive for Coronavirus OC43 on 01/01, and abx were subsequently discontinued the same day.   #Atrial fibrillation Patient been on Xarelto 20 mg daily for about the last month, and prior to that she was on Eliquis for about a year and a half.  She was persistently in atrial fibrillation however was mostly rate controlled with infrequent RVR.  She was continued on home diltiazem 180 mg daily and Xarelto 20 mg daily.   #Chronic diastolic heart  failure Patient has chronic bilateral lower extremity edema for which she takes Lasix 40 mg daily.  She has not had recent increases in weight or worsening edema, however has required more head elevation in order to sleep at night. Echocardiogram 12/30 showed LV EF of 60-65% with normal function, no regional wall motion abnormalities, and mild concentric LV hypertrophy; mildly reduced RV systolic function, RV moderately enlarged, moderately elevated PA systolic pressure; severely dilated bilateral atria.  She was continued on home Lasix 40 mg daily.   #Right knee replacement Patient is S/P right knee replacement about 1 month ago. She has been working with physical therapy at home and endorsed continued moderate to severe pain even at rest.  Her pain was managed with Tylenol 650 mg every 6 hours as needed for mild pain and oxycodone 5-10 mg every 6  hours as needed for severe pain.  Physical therapy evaluated the patient and did not have further recommendations.   #Smoker On day of admission, patient had not smoked tobacco in 6 days. She was managed with a nicotine patch.  Discharge Exam:   BP 128/61 (BP Location: Left Arm)    Pulse 96    Temp 98.2 F (36.8 C) (Oral)    Resp 16    Ht 5\' 7"  (1.702 m)    Wt 102.1 kg    SpO2 94%    BMI 35.25 kg/m  Discharge exam:  Constitutional: Chronically ill-appearing female, no acute distress noted. Cardio: Regular rate with irregularly irregular rhythm.  No murmurs, rubs, gallops. Pulm: Clear to auscultation bilaterally.  Patient is able to communicate in full sentences without tripoding, no pursed lips.  Abdomen: Soft, nontender, nondistended. MSK: Bilateral nonpitting edema to the lower extremities, overall unchanged per patient Skin: Skin is warm and dry. Neuro: Alert and oriented x3.  No focal deficit noted. Psych: Normal mood and affect.  Pertinent Labs, Studies, and Procedures:  DG Chest 2 View  Result Date: 09/06/2021 CLINICAL DATA:  Shortness of  breath. EXAM: CHEST - 2 VIEW COMPARISON:  CTA chest and portable chest both 09/04/2021 FINDINGS: Heart is moderately enlarged. No vascular congestion is seen. The aorta is tortuous and ectatic with patchy calcifications, stable mediastinal configuration. There is increased opacity in the right middle lobe distribution concerning for pneumonia or aspiration. Rest of the lungs clear with COPD change. No pleural effusion is seen. Osteopenia and slight thoracic levoscoliosis. Thoracic spondylosis. IMPRESSION: Increased opacity right middle lobe distribution, concern for pneumonia or aspiration. Follow-up recommended. COPD. Cardiomegaly . Electronically Signed   By: Telford Nab M.D.   On: 09/06/2021 20:49   ECHOCARDIOGRAM COMPLETE  Result Date: 09/07/2021    ECHOCARDIOGRAM REPORT   Patient Name:   [facility] BLOCK Date of Exam: 09/07/2021 Medical Rec #:  [id number]          Height:       67.0 in Accession #:    [id number]         Weight:       239.0 lb Date of Birth:  1945-12-07           BSA:          2.181 m Patient Age:    19 years           BP:           130/70 mmHg Patient Gender: F                  HR:           110 bpm. Exam Location:  Inpatient Procedure: 2D Echo, Cardiac Doppler and Color Doppler Indications:     Dyspnea  History:         Patient has prior history of Echocardiogram examinations, most                  recent 07/05/2020. COPD; Arrythmias:Atrial Fibrillation.  Sonographer:     Glo Herring Referring Phys:  [place] Diagnosing Phys: Eleonore Chiquito MD IMPRESSIONS  1. Left ventricular ejection fraction, by estimation, is 60 to 65%. The left ventricle has normal function. The left ventricle has no regional wall motion abnormalities. There is mild concentric left ventricular hypertrophy. Left ventricular diastolic function could not be evaluated.  2. Right ventricular systolic function is mildly reduced. The right ventricular size is moderately enlarged. There  is moderately elevated  pulmonary artery systolic pressure. The estimated right ventricular systolic pressure is 0000000 mmHg.  3. Left atrial size was severely dilated.  4. Right atrial size was severely dilated.  5. The mitral valve is grossly normal. Mild mitral valve regurgitation. No evidence of mitral stenosis.  6. The aortic valve is tricuspid. Aortic valve regurgitation is not visualized. No aortic stenosis is present.  7. The inferior vena cava is dilated in size with <50% respiratory variability, suggesting right atrial pressure of 15 mmHg. Comparison(s): No significant change from prior study. FINDINGS  Left Ventricle: Left ventricular ejection fraction, by estimation, is 60 to 65%. The left ventricle has normal function. The left ventricle has no regional wall motion abnormalities. The left ventricular internal cavity size was normal in size. There is  mild concentric left ventricular hypertrophy. Left ventricular diastolic function could not be evaluated due to atrial fibrillation. Left ventricular diastolic function could not be evaluated. Right Ventricle: The right ventricular size is moderately enlarged. No increase in right ventricular wall thickness. Right ventricular systolic function is mildly reduced. There is moderately elevated pulmonary artery systolic pressure. The tricuspid regurgitant velocity is 2.79 m/s, and with an assumed right atrial pressure of 15 mmHg, the estimated right ventricular systolic pressure is 0000000 mmHg. Left Atrium: Left atrial size was severely dilated. Right Atrium: Right atrial size was severely dilated. Pericardium: Trivial pericardial effusion is present. Presence of epicardial fat layer. Mitral Valve: The mitral valve is grossly normal. Mild mitral valve regurgitation. No evidence of mitral valve stenosis. Tricuspid Valve: The tricuspid valve is grossly normal. Tricuspid valve regurgitation is trivial. No evidence of tricuspid stenosis. Aortic Valve: The aortic valve is tricuspid. Aortic  valve regurgitation is not visualized. No aortic stenosis is present. Aortic valve mean gradient measures 3.3 mmHg. Aortic valve peak gradient measures 5.7 mmHg. Aortic valve area, by VTI measures 1.85 cm. Pulmonic Valve: The pulmonic valve was grossly normal. Pulmonic valve regurgitation is not visualized. No evidence of pulmonic stenosis. Aorta: The aortic root and ascending aorta are structurally normal, with no evidence of dilitation. Venous: The inferior vena cava is dilated in size with less than 50% respiratory variability, suggesting right atrial pressure of 15 mmHg. IAS/Shunts: The atrial septum is grossly normal.  LEFT VENTRICLE PLAX 2D LVIDd:         5.20 cm LVIDs:         3.40 cm LV PW:         1.30 cm LV IVS:        1.30 cm LVOT diam:     1.90 cm LV SV:         40 LV SV Index:   18 LVOT Area:     2.84 cm  RIGHT VENTRICLE          IVC RV Basal diam:  4.30 cm  IVC diam: 3.00 cm LEFT ATRIUM              Index        RIGHT ATRIUM           Index LA diam:        4.80 cm  2.20 cm/m   RA Area:     37.80 cm LA Vol (A2C):   100.0 ml 45.85 ml/m  RA Volume:   146.00 ml 66.94 ml/m LA Vol (A4C):   85.6 ml  39.24 ml/m LA Biplane Vol: 95.7 ml  43.87 ml/m  AORTIC VALVE AV Area (Vmax):    2.14  cm AV Area (Vmean):   1.88 cm AV Area (VTI):     1.85 cm AV Vmax:           119.33 cm/s AV Vmean:          86.500 cm/s AV VTI:            0.214 m AV Peak Grad:      5.7 mmHg AV Mean Grad:      3.3 mmHg LVOT Vmax:         90.23 cm/s LVOT Vmean:        57.267 cm/s LVOT VTI:          0.140 m LVOT/AV VTI ratio: 0.65  AORTA Ao Root diam: 2.80 cm Ao Asc diam:  3.60 cm TRICUSPID VALVE TR Peak grad:   31.1 mmHg TR Vmax:        279.00 cm/s  SHUNTS Systemic VTI:  0.14 m Systemic Diam: 1.90 cm Eleonore Chiquito MD Electronically signed by Eleonore Chiquito MD Signature Date/Time: 09/07/2021/3:20:28 PM    Final (Updated)      BMP Latest Ref Rng & Units 09/08/2021 09/06/2021 09/04/2021  Glucose 70 - 99 mg/dL 125(H) 99 94  BUN 8 - 23  mg/dL 20 12 9   Creatinine 0.44 - 1.00 mg/dL 1.13(H) 1.03(H) 0.97  BUN/Creat Ratio 12 - 28 - - -  Sodium 135 - 145 mmol/L 136 139 138  Potassium 3.5 - 5.1 mmol/L 3.7 3.7 3.9  Chloride 98 - 111 mmol/L 97(L) 103 103  CO2 22 - 32 mmol/L 27 25 23   Calcium 8.9 - 10.3 mg/dL 9.0 9.1 8.8(L)   CBC Latest Ref Rng & Units 09/08/2021 09/06/2021 09/04/2021  WBC 4.0 - 10.5 K/uL 7.1 6.4 6.2  Hemoglobin 12.0 - 15.0 g/dL 14.4 14.7 15.2(H)  Hematocrit 36.0 - 46.0 % 46.0 46.2(H) 49.5(H)  Platelets 150 - 400 K/uL 177 162 202    Signed: Orvis Brill, MD 09/10/2021, 11:59 AM   Pager: [phone]

## 2021-09-10 NOTE — TOC Progression Note (Addendum)
Transition of Care [facility]) - Progression Note    Patient Details  Name: Sherri Hart MRN: [id number] Date of Birth: 12-Sep-1945  Transition of Care [facility]) CM/SW Contact  Beckie Busing, RN Phone Number:[phone]  09/10/2021, 1:33 PM  Clinical Narrative:    CM made aware that patient is requesting transportation home. Cm at bedside to discuss transportation with patient. Patient states that she has no transportation home but does have a roommate in the home that is able to let her in when she arrives. Patient states that she does have a granddaughter but the granddaughter is unable to pick the patient up. Per physical therapist the patient is able ambulate independently and climb stairs independently but is fearful therefore they are requesting door to door transportation for someone to standby for patient climbing the stairs. CM has called [facility] transportation to request door to door transport the call goes to voicemail. Two messages have been left. Patient is not PTAR candidate due to being ambulatory with no medical need for transport. Patient and nurse have been made aware that TOC is unable to provide transportation at this time.   1350 Per nurse the patient states that the granddaughter will pick the patient up for discharge home.  Expected Discharge Plan: Home/Self Care Barriers to Discharge: No Barriers Identified  Expected Discharge Plan and Services Expected Discharge Plan: Home/Self Care         Expected Discharge Date: 09/10/21                                     Social Determinants of Health (SDOH) Interventions    Readmission Risk Interventions No flowsheet data found.

## 2021-09-10 NOTE — Plan of Care (Addendum)
Patient dc delayed due to transportation. Case manager reached out to  transportation, no answer due to holiday. Dr. Lorin Glass aware granddaughter is to pick up patient tonight around 8.   Problem: Education: Goal: Knowledge of disease or condition will improve 09/10/2021 1239 by Trixie Deis, RN Outcome: Adequate for Discharge 09/10/2021 1108 by Trixie Deis, RN Outcome: Progressing Goal: Knowledge of the prescribed therapeutic regimen will improve Outcome: Adequate for Discharge Goal: Individualized Educational Video(s) Outcome: Adequate for Discharge   Problem: Activity: Goal: Ability to tolerate increased activity will improve Outcome: Adequate for Discharge Goal: Will verbalize the importance of balancing activity with adequate rest periods Outcome: Adequate for Discharge   Problem: Respiratory: Goal: Ability to maintain a clear airway will improve Outcome: Adequate for Discharge Goal: Levels of oxygenation will improve Outcome: Adequate for Discharge Goal: Ability to maintain adequate ventilation will improve Outcome: Adequate for Discharge   Problem: Activity: Goal: Ability to tolerate increased activity will improve Outcome: Adequate for Discharge   Problem: Clinical Measurements: Goal: Ability to maintain a body temperature in the normal range will improve Outcome: Adequate for Discharge   Problem: Respiratory: Goal: Ability to maintain adequate ventilation will improve Outcome: Adequate for Discharge Goal: Ability to maintain a clear airway will improve Outcome: Adequate for Discharge   Problem: Education: Goal: Knowledge of General Education information will improve Description: Including pain rating scale, medication(s)/side effects and non-pharmacologic comfort measures Outcome: Adequate for Discharge   Problem: Health Behavior/Discharge Planning: Goal: Ability to manage health-related needs will improve Outcome: Adequate for Discharge    Problem: Clinical Measurements: Goal: Ability to maintain clinical measurements within normal limits will improve Outcome: Adequate for Discharge Goal: Will remain free from infection Outcome: Adequate for Discharge Goal: Diagnostic test results will improve Outcome: Adequate for Discharge Goal: Respiratory complications will improve Outcome: Adequate for Discharge Goal: Cardiovascular complication will be avoided Outcome: Adequate for Discharge   Problem: Activity: Goal: Risk for activity intolerance will decrease Outcome: Adequate for Discharge   Problem: Nutrition: Goal: Adequate nutrition will be maintained Outcome: Adequate for Discharge   Problem: Pain Managment: Goal: General experience of comfort will improve 09/10/2021 1239 by Trixie Deis, RN Outcome: Adequate for Discharge 09/10/2021 1108 by Trixie Deis, RN Outcome: Progressing   Problem: Safety: Goal: Ability to remain free from injury will improve 09/10/2021 1239 by Trixie Deis, RN Outcome: Adequate for Discharge 09/10/2021 1108 by Trixie Deis, RN Outcome: Progressing   Problem: Skin Integrity: Goal: Risk for impaired skin integrity will decrease 09/10/2021 1239 by Trixie Deis, RN Outcome: Adequate for Discharge 09/10/2021 1108 by Trixie Deis, RN Outcome: Progressing

## 2021-09-10 NOTE — Progress Notes (Signed)
Physical Therapy Treatment Patient Details Name: Sherri Hart MRN: [id number] DOB: [birth year] Today's Date: 09/10/2021   History of Present Illness 76 yo female admitted with SOB requiring 4L O2. PMH COPD R TKA CHF Afib with RVR back surg    PT Comments    Continuing work on functional mobility and activity tolerance;  Session focused on stair negotiation,a nd pt demonstrates good technique; She reports concern that her granddaughter won't be home in time to help her up the stairs to enter her home, and would feel much more comfortable with someone standing by her while going up the steps; Discussed with TOC RN, who plans to requests door-to-door assistance from pt's driver; OK for dc home from PT standpoint   Recommendations for follow up therapy are one component of a multi-disciplinary discharge planning process, led by the attending physician.  Recommendations may be updated based on patient status, additional functional criteria and insurance authorization.  Follow Up Recommendations  No PT follow up     Assistance Recommended at Discharge Intermittent Supervision/Assistance  Equipment Recommendations  Other (comment) (shower seat)    Recommendations for Other Services       Precautions / Restrictions Precautions Precautions: Fall Precaution Comments: Fall risk reduced with use of RW; watch O2     Mobility  Bed Mobility                    Transfers   Equipment used: Rolling walker (2 wheels) Transfers: Sit to/from Stand Sit to Stand: Supervision           General transfer comment: Slow rise due to L knee soreness; noted slightly decr control with descent to sit    Ambulation/Gait Ambulation/Gait assistance: Supervision Gait Distance (Feet): 30 Feet Assistive device: Rolling walker (2 wheels)         General Gait Details: Overall managing well with RW   Stairs Stairs: Yes Stairs assistance: Min guard (without physical contact) Stair  Management: One rail Left;Sideways;Step to pattern Number of Stairs: 4 (opted to only do a few steps to save energy for the flight of steps to enter her home) General stair comments: Demonstrated a solid technqiue using L rail for support   Wheelchair Mobility    Modified Rankin (Stroke Patients Only)       Balance     Sitting balance-Leahy Scale: Good       Standing balance-Leahy Scale:  (approaching Good)                              Cognition Arousal/Alertness: Awake/alert Behavior During Therapy: WFL for tasks assessed/performed Overall Cognitive Status: Within Functional Limits for tasks assessed                                          Exercises      General Comments General comments (skin integrity, edema, etc.): Discussed plans to go home today; Pt tells me a friend will be there who can help with cooking and home management, but can't give physical assist; pt's granddaughter, who can give physical assist as needed, will be home later tonight      Pertinent Vitals/Pain Pain Assessment: Faces Faces Pain Scale: Hurts little more Pain Location: chest, L knee Pain Descriptors / Indicators: Discomfort Pain Intervention(s): RN gave pain meds during session    Home  Living                          Prior Function            PT Goals (current goals can now be found in the care plan section) Acute Rehab PT Goals Patient Stated Goal: To be able to manage at home PT Goal Formulation: With patient Time For Goal Achievement: 09/21/21 Potential to Achieve Goals: Good Progress towards PT goals: Progressing toward goals    Frequency    Min 3X/week      PT Plan Current plan remains appropriate;Equipment recommendations need to be updated    Co-evaluation              AM-PAC PT "6 Clicks" Mobility   Outcome Measure  Help needed turning from your back to your side while in a flat bed without using bedrails?:  None Help needed moving from lying on your back to sitting on the side of a flat bed without using bedrails?: None Help needed moving to and from a bed to a chair (including a wheelchair)?: None Help needed standing up from a chair using your arms (e.g., wheelchair or bedside chair)?: A Little Help needed to walk in hospital room?: A Little Help needed climbing 3-5 steps with a railing? : A Little 6 Click Score: 21    End of Session   Activity Tolerance: Patient tolerated treatment well Patient left: in chair;with call bell/phone within reach Nurse Communication: Mobility status;Other (comment) (Considerations for getting up steps to enter her home) PT Visit Diagnosis: Unsteadiness on feet (R26.81);Muscle weakness (generalized) (M62.81);Difficulty in walking, not elsewhere classified (R26.2)     Time: [id number] PT Time Calculation (min) (ACUTE ONLY): 19 min  Charges:  $Gait Training: 8-22 mins                     Roney Marion, [place]  Acute Rehabilitation Services Pager [phone] Office [phone]    Colletta [place] 09/10/2021, 1:13 PM

## 2021-09-10 NOTE — Plan of Care (Signed)
°  Problem: Education: Goal: Knowledge of disease or condition will improve Outcome: Progressing   Problem: Pain Managment: Goal: General experience of comfort will improve Outcome: Progressing   Problem: Safety: Goal: Ability to remain free from injury will improve Outcome: Progressing   Problem: Skin Integrity: Goal: Risk for impaired skin integrity will decrease Outcome: Progressing

## 2021-10-02 ENCOUNTER — Encounter (HOSPITAL_COMMUNITY): Payer: Self-pay | Admitting: Radiology

## 2022-01-03 ENCOUNTER — Other Ambulatory Visit: Payer: Self-pay | Admitting: Family Medicine

## 2022-01-03 DIAGNOSIS — Z780 Asymptomatic menopausal state: Secondary | ICD-10-CM

## 2022-01-22 ENCOUNTER — Ambulatory Visit
Admission: RE | Admit: 2022-01-22 | Discharge: 2022-01-22 | Disposition: A | Discharge status: Home / Self Care | Payer: Medicare HMO | Source: Ambulatory Visit | Attending: Family Medicine | Admitting: Family Medicine

## 2022-01-22 DIAGNOSIS — Z780 Asymptomatic menopausal state: Secondary | ICD-10-CM

## 2022-06-03 ENCOUNTER — Other Ambulatory Visit: Payer: Self-pay

## 2022-06-03 ENCOUNTER — Emergency Department (HOSPITAL_BASED_OUTPATIENT_CLINIC_OR_DEPARTMENT_OTHER): Payer: Medicare HMO

## 2022-06-03 ENCOUNTER — Encounter (HOSPITAL_COMMUNITY): Payer: Self-pay

## 2022-06-03 ENCOUNTER — Emergency Department (HOSPITAL_COMMUNITY)
Admission: EM | Admit: 2022-06-03 | Discharge: 2022-06-03 | Disposition: A | Discharge status: Home / Self Care | Payer: Medicare HMO | Attending: Emergency Medicine | Admitting: Emergency Medicine

## 2022-06-03 ENCOUNTER — Emergency Department (HOSPITAL_COMMUNITY): Payer: Medicare HMO

## 2022-06-03 DIAGNOSIS — Z7901 Long term (current) use of anticoagulants: Secondary | ICD-10-CM | POA: Insufficient documentation

## 2022-06-03 DIAGNOSIS — R52 Pain, unspecified: Secondary | ICD-10-CM

## 2022-06-03 DIAGNOSIS — R600 Localized edema: Secondary | ICD-10-CM

## 2022-06-03 DIAGNOSIS — N3090 Cystitis, unspecified without hematuria: Secondary | ICD-10-CM

## 2022-06-03 DIAGNOSIS — I509 Heart failure, unspecified: Secondary | ICD-10-CM | POA: Insufficient documentation

## 2022-06-03 DIAGNOSIS — M7989 Other specified soft tissue disorders: Secondary | ICD-10-CM

## 2022-06-03 DIAGNOSIS — M79604 Pain in right leg: Secondary | ICD-10-CM | POA: Diagnosis not present

## 2022-06-03 DIAGNOSIS — Z7951 Long term (current) use of inhaled steroids: Secondary | ICD-10-CM | POA: Diagnosis not present

## 2022-06-03 DIAGNOSIS — J449 Chronic obstructive pulmonary disease, unspecified: Secondary | ICD-10-CM | POA: Insufficient documentation

## 2022-06-03 DIAGNOSIS — R0602 Shortness of breath: Secondary | ICD-10-CM | POA: Diagnosis not present

## 2022-06-03 DIAGNOSIS — M79605 Pain in left leg: Secondary | ICD-10-CM | POA: Diagnosis not present

## 2022-06-03 DIAGNOSIS — I4891 Unspecified atrial fibrillation: Secondary | ICD-10-CM | POA: Insufficient documentation

## 2022-06-03 LAB — CBC WITH DIFFERENTIAL/PLATELET
Abs Immature Granulocytes: 0.01 K/uL (ref 0.00–0.07)
Basophils Absolute: 0 K/uL (ref 0.0–0.1)
Basophils Relative: 1 %
Eosinophils Absolute: 0.2 K/uL (ref 0.0–0.5)
Eosinophils Relative: 2 %
HCT: 46.2 % — ABNORMAL HIGH (ref 36.0–46.0)
Hemoglobin: 15.3 g/dL — ABNORMAL HIGH (ref 12.0–15.0)
Immature Granulocytes: 0 %
Lymphocytes Relative: 19 %
Lymphs Abs: 1.5 K/uL (ref 0.7–4.0)
MCH: 33.5 pg (ref 26.0–34.0)
MCHC: 33.1 g/dL (ref 30.0–36.0)
MCV: 101.1 fL — ABNORMAL HIGH (ref 80.0–100.0)
Monocytes Absolute: 0.5 K/uL (ref 0.1–1.0)
Monocytes Relative: 7 %
Neutro Abs: 5.5 K/uL (ref 1.7–7.7)
Neutrophils Relative %: 71 %
Platelets: 181 K/uL (ref 150–400)
RBC: 4.57 MIL/uL (ref 3.87–5.11)
RDW: 12.7 % (ref 11.5–15.5)
WBC: 7.7 K/uL (ref 4.0–10.5)
nRBC: 0 % (ref 0.0–0.2)

## 2022-06-03 LAB — COMPREHENSIVE METABOLIC PANEL
ALT: 10 U/L (ref 0–44)
AST: 16 U/L (ref 15–41)
Albumin: 4 g/dL (ref 3.5–5.0)
Alkaline Phosphatase: 66 U/L (ref 38–126)
Anion gap: 8 (ref 5–15)
BUN: 14 mg/dL (ref 8–23)
CO2: 27 mmol/L (ref 22–32)
Calcium: 9.4 mg/dL (ref 8.9–10.3)
Chloride: 105 mmol/L (ref 98–111)
Creatinine, Ser: 1.05 mg/dL — ABNORMAL HIGH (ref 0.44–1.00)
GFR, Estimated: 56 mL/min — ABNORMAL LOW (ref >60)
Glucose, Bld: 99 mg/dL (ref 70–99)
Potassium: 4.2 mmol/L (ref 3.5–5.1)
Sodium: 140 mmol/L (ref 135–145)
Total Bilirubin: 1.2 mg/dL (ref 0.3–1.2)
Total Protein: 6.9 g/dL (ref 6.5–8.1)

## 2022-06-03 LAB — TROPONIN I (HIGH SENSITIVITY)
Troponin I (High Sensitivity): 10 ng/L (ref <18)
Troponin I (High Sensitivity): 10 ng/L

## 2022-06-03 LAB — URINALYSIS, ROUTINE W REFLEX MICROSCOPIC
Bilirubin Urine: NEGATIVE
Glucose, UA: NEGATIVE mg/dL
Hgb urine dipstick: NEGATIVE
Ketones, ur: NEGATIVE mg/dL
Nitrite: POSITIVE — AB
Protein, ur: NEGATIVE mg/dL
Specific Gravity, Urine: 1.015 (ref 1.005–1.030)
pH: 6 (ref 5.0–8.0)

## 2022-06-03 LAB — BRAIN NATRIURETIC PEPTIDE: B Natriuretic Peptide: 259.2 pg/mL — ABNORMAL HIGH (ref 0.0–100.0)

## 2022-06-03 LAB — URINALYSIS, MICROSCOPIC (REFLEX): RBC / HPF: NONE SEEN RBC/hpf (ref 0–5)

## 2022-06-03 MED ORDER — MORPHINE SULFATE (PF) 4 MG/ML IV SOLN
4.0000 mg | Freq: Once | INTRAVENOUS | Status: AC
  Administered 2022-06-03: 4 mg via INTRAVENOUS
  Filled 2022-06-03: qty 1

## 2022-06-03 MED ORDER — ALBUTEROL SULFATE HFA 108 (90 BASE) MCG/ACT IN AERS: 1.0000 | INHALATION_SPRAY | Freq: Once | RESPIRATORY_TRACT | Status: DC

## 2022-06-03 MED ORDER — FUROSEMIDE 10 MG/ML IJ SOLN
20.0000 mg | Freq: Once | INTRAMUSCULAR | Status: AC
  Administered 2022-06-03: 20 mg via INTRAVENOUS
  Filled 2022-06-03: qty 2

## 2022-06-03 MED ORDER — IPRATROPIUM-ALBUTEROL 0.5-2.5 (3) MG/3ML IN SOLN
3.0000 mL | Freq: Once | RESPIRATORY_TRACT | Status: AC
  Administered 2022-06-03: 3 mL via RESPIRATORY_TRACT
  Filled 2022-06-03: qty 3

## 2022-06-03 MED ORDER — CEPHALEXIN 500 MG PO CAPS
500.0000 mg | ORAL_CAPSULE | Freq: Four times a day (QID) | ORAL | 0 refills | Status: AC
Start: 2022-06-03 — End: 2022-06-10

## 2022-06-03 MED ORDER — IOHEXOL 350 MG/ML SOLN
75.0000 mL | Freq: Once | INTRAVENOUS | Status: AC | PRN
  Administered 2022-06-03: 75 mL via INTRAVENOUS

## 2022-06-03 NOTE — Discharge Instructions (Signed)
You have been seen today for your complaint of lower extremity edema and shortness of breath. Your lab work was overall reassuring, however did show signs of a urinary tract infection. Your imaging was overall reassuring and showed no abnormalities. Your discharge medications include Keflex.  This is an antibiotic.  You should take it as prescribed.  Take the entire duration of the prescription. Follow up with: Your primary care provider in 1 week Please seek immediate medical care if you develop any of the following symptoms: You have shortness of breath or chest pain. You cannot breathe when you lie down. You have pain, redness, or warmth in the swollen areas. You have heart, liver, or kidney disease and get edema all of a sudden. You have a fever and your symptoms get worse all of a sudden. At this time there does not appear to be the presence of an emergent medical condition, however there is always the potential for conditions to change. Please read and follow the below instructions.  Do not take your medicine if  develop an itchy rash, swelling in your mouth or lips, or difficulty breathing; call 911 and seek immediate emergency medical attention if this occurs.  You may review your lab tests and imaging results in their entirety on your MyChart account.  Please discuss all results of fully with your primary care provider and other specialist at your follow-up visit.  Note: Portions of this text may have been transcribed using voice recognition software. Every effort was made to ensure accuracy; however, inadvertent computerized transcription errors may still be present.

## 2022-06-03 NOTE — ED Provider Notes (Signed)
[facility] EMERGENCY DEPARTMENT Provider Note   CSN: [id number] Arrival date & time: 06/03/22  1054     History  Chief Complaint  Patient presents with   Leg Swelling    Sherri Hart Block is a 76 y.o. female.  With a history of A-fib, COPD, arthritis, CHF, anxiety who presents to the ED for evaluation of bilateral lower extremity swelling and pain as well as shortness of breath.  Patient states that she has been off her Xarelto for 4 weeks due to not being able to afford it.  Went into her primary care provider today who was concerned for CHF exacerbation and sent her here for further evaluation.  Patient states that she chronically has lower extremity swelling and pain, however has increased over the past 3 weeks.  Also states she has shortness of breath at baseline, however this is also increased over the past 3 weeks and she has had to rely more on her rescue inhaler throughout the day.  Patient takes Lasix 20 mg once daily, however missed a couple doses in the past week because she reported urinary frequency and did not want the medication to make her urinate more.  Denies dysuria, chest pain, hemoptysis, history of DVT or PE, dizziness, lightheadedness, numbness, weakness, tingling, recent immobilization, history of cancer, abdominal pain, nausea, vomiting.  Patient is ambulatory with a walker at baseline.  States she is still ambulatory.  HPI     Home Medications Prior to Admission medications   Medication Sig Start Date End Date Taking? Authorizing Provider  albuterol (VENTOLIN HFA) 108 (90 Base) MCG/ACT inhaler Inhale 2 puffs into the lungs every 6 (six) hours as needed for wheezing or shortness of breath. 09/10/21   Andrey Campanile, MD  diltiazem (CARDIZEM CD) 180 MG 24 hr capsule Take 1 capsule (180 mg total) by mouth daily. 02/04/21   Almon Hercules, MD  furosemide (LASIX) 40 MG tablet Take 1 tablet (40 mg total) by mouth daily. 02/04/21   Almon Hercules, MD   guaiFENesin (MUCINEX) 600 MG 12 hr tablet Take 1 tablet (600 mg total) by mouth 2 (two) times daily. 09/10/21   Andrey Campanile, MD  loratadine (CLARITIN) 10 MG tablet Take 10 mg by mouth daily as needed for allergies.    [provider]  nicotine (NICODERM CQ - DOSED IN MG/24 HOURS) 14 mg/24hr patch Place 1 patch (14 mg total) onto the skin daily. 09/11/21   Andrey Campanile, MD  oxyCODONE (OXY IR/ROXICODONE) 5 MG immediate release tablet Take 1-2 tablets (5-10 mg total) by mouth every 6 (six) hours as needed for severe pain. 07/04/21   Nelia Shi D, PA-C  predniSONE (DELTASONE) 20 MG tablet Take 2 tablets (40 mg total) by mouth daily with breakfast. 09/11/21   Andrey Campanile, MD  rivaroxaban (XARELTO) 20 MG TABS tablet Take 1 tablet (20 mg total) by mouth daily with supper. 07/04/21   Nelia Shi D, PA-C  umeclidinium-vilanterol (ANORO ELLIPTA) 62.5-25 MCG/ACT AEPB Inhale 1 puff into the lungs daily. 09/10/21   Andrey Campanile, MD      Allergies    Penicillins    Review of Systems   Review of Systems  Respiratory:  Positive for cough (dry) and shortness of breath.   Cardiovascular:  Positive for leg swelling.  All other systems reviewed and are negative.   Physical Exam Updated Vital Signs BP (!) 145/63   Pulse 76   Temp 98 F (36.7 C) (  Oral)   Resp 15   Ht 5\' 7"  (1.702 m)   Wt 111.1 kg   SpO2 93%   BMI 38.37 kg/m  Physical Exam Vitals and nursing note reviewed.  Constitutional:      General: She is not in acute distress.    Appearance: Normal appearance. She is well-developed. She is obese. She is not ill-appearing.  HENT:     Head: Normocephalic and atraumatic.     Mouth/Throat:     Mouth: Mucous membranes are moist.     Pharynx: Oropharynx is clear.     Comments: Poor dentition with multiple missing teeth Eyes:     Conjunctiva/sclera: Conjunctivae normal.  Cardiovascular:     Rate and Rhythm: Normal rate and regular rhythm.     Pulses:           Radial pulses are 2+ on the right side and 2+ on the left side.       Dorsalis pedis pulses are 2+ on the right side and 2+ on the left side.     Heart sounds: No murmur heard. Pulmonary:     Effort: Pulmonary effort is normal. No respiratory distress.     Breath sounds: No stridor. Wheezing present. No rhonchi or rales.  Abdominal:     General: Abdomen is flat.     Palpations: Abdomen is soft.     Tenderness: There is no abdominal tenderness.  Musculoskeletal:     Cervical back: Neck supple.     Right lower leg: Edema present.     Left lower leg: Edema present.  Skin:    General: Skin is warm and dry.     Capillary Refill: Capillary refill takes less than 2 seconds.  Neurological:     General: No focal deficit present.     Mental Status: She is alert and oriented to person, place, and time.  Psychiatric:        Mood and Affect: Mood normal.     ED Results / Procedures / Treatments   Labs (all labs ordered are listed, but only abnormal results are displayed) Labs Reviewed  COMPREHENSIVE METABOLIC PANEL - Abnormal; Notable for the following components:      Result Value   Creatinine, Ser 1.05 (*)    GFR, Estimated 56 (*)    All other components within normal limits  CBC WITH DIFFERENTIAL/PLATELET - Abnormal; Notable for the following components:   Hemoglobin 15.3 (*)    HCT 46.2 (*)    MCV 101.1 (*)    All other components within normal limits  BRAIN NATRIURETIC PEPTIDE - Abnormal; Notable for the following components:   B Natriuretic Peptide 259.2 (*)    All other components within normal limits  TROPONIN I (HIGH SENSITIVITY)  TROPONIN I (HIGH SENSITIVITY)    EKG None  Radiology DG Chest 2 View  Result Date: 06/03/2022 CLINICAL DATA:  Provided history: Shortness of breath. Bilateral leg swelling. EXAM: CHEST - 2 VIEW COMPARISON:  Prior chest radiographs 09/06/2021 and earlier. CT angiogram chest 09/04/2021. FINDINGS: Cardiomegaly. Aortic atherosclerosis.  Prominence of the interstitial lung markings. No evidence of pleural effusion or pneumothorax. No acute bony abnormality identified. ACDF hardware. Surgical clips within the upper abdomen. IMPRESSION: Cardiomegaly. Prominence of the interstitial lung markings. Findings are at least partly chronic. However, a component of mild superimposed interstitial edema is suspected. Aortic Atherosclerosis (ICD10-I70.0). Electronically Signed   By: Kellie Simmering D.O.   On: 06/03/2022 12:23    Procedures Procedures    Medications  Ordered in ED Medications  ipratropium-albuterol (DUONEB) 0.5-2.5 (3) MG/3ML nebulizer solution 3 mL (has no administration in time range)    ED Course/ Medical Decision Making/ A&P Clinical Course as of 06/03/22 1934  Mon Jun 03, 2022  1722 DG Chest 2 View I personally reviewed the image.  Prominent interstitial lung markings.  Acute likely superimposed on chronic [AS]    Clinical Course User Index [AS] Elga Santy, Edsel Petrin, PA-C                           Medical Decision Making Amount and/or Complexity of Data Reviewed Labs: ordered. Radiology: ordered. Decision-making details documented in ED Course.  Risk Prescription drug management.  This patient presents to the ED for concern of bilateral lower extremity edema with pain, this involves an extensive number of treatment options, and is a complaint that carries with it a high risk of complications and morbidity.  The differential diagnosis includes CHF exacerbation, medication reaction, cellulitis, fracture, strain or sprain   Co morbidities that complicate the patient evaluation  CHF, COPD, A-fib  Additional history obtained from: Nursing notes from this visit. Previous records within EMR system admission on 09/06/2021 for COPD exacerbation Family daughters present and provides a portion of the history  I ordered, reviewed and interpreted labs which include: BNP, CMP, urinalysis, troponin, delta troponin, CBC.   BNP stable from last reading.  CMP within normal limits.  CBC normal.  Urinalysis showed nitrites and leukocytes.   I ordered imaging studies including chest x-ray, bilateral lower extremity ultrasound, CT chest PE study I independently visualized and interpreted imaging which showed x-ray showing mild increase in interstitial edema.  CT angio showed no PE, mild cardiomegaly and pulmonary congestion.  Ultrasound showed no DVT. I agree with the radiologist interpretation  Cardiac Monitoring:  The patient was maintained on a cardiac monitor.  I personally viewed and interpreted the cardiac monitored which showed an underlying rhythm of: A-fib  Afebrile, hemodynamically stable.  Patient is a 76 year old female with a history as stated above presenting for evaluation of increasing bilateral lower extremity edema and urinary frequency.  She initially stated she had shortness of breath, however on repeat history, patient states that it is only when she tries to breathe through her nares because she has congestion and typically takes Claritin for this.  Patient did have wheezing on initial evaluation, so DuoNeb was initiated.  Patient reported symptomatic relief afterwards.  Patient has been without her Xarelto for the past month because she has been unable to afford it.  Has been in A-fib without RVR while in the ED.  Negative for DVT, PE, or significant pulmonary congestion.  Patient was treated with IV Lasix.  She did tell me that she has missed a couple doses of her p.o. Lasix over the past week because she has had urinary frequency and did not want to exacerbate the issue.  I suspect this to be the cause of her increasing lower extremity edema.  Urinalysis did show signs of UTI.  Patient will be treated for this with Keflex.  Patient states she has taken Keflex before with no issues.  Did have an allergic reaction to penicillin..  I urged patient to continue with her home medications.  She does not see a  cardiologist.  Ambulatory referral will be placed.  She was given strict return precautions.  Patient is stable at discharge.  At this time there does not appear to be  any evidence of an acute emergency medical condition and the patient appears stable for discharge with appropriate outpatient follow up. Diagnosis was discussed with patient who verbalizes understanding of care plan and is agreeable to discharge. I have discussed return precautions with patient and daughter who verbalizes understanding. Patient encouraged to follow-up with their PCP within 1. All questions answered.  Patient's case discussed with Dr. Jearld Fenton who agrees with plan to discharge with follow-up.   Note: Portions of this report may have been transcribed using voice recognition software. Every effort was made to ensure accuracy; however, inadvertent computerized transcription errors may still be present.          Final Clinical Impression(s) / ED Diagnoses Final diagnoses:  None    Rx / DC Orders ED Discharge Orders     None         Sherri Hart, Cordelia Poche 06/03/22 2211    Loetta Rough, MD 06/04/22 1041

## 2022-06-03 NOTE — ED Triage Notes (Signed)
Pt arrived POV from the drs office for bilateral leg swelling. Pt has pitting edema to both legs and states they are hurting. Pt also states the dr was worried about her heart. Pt also admitted she she has not taken her blood thinner in over a month d/t not being able to afford medication.

## 2022-06-03 NOTE — Progress Notes (Signed)
Lower extremity venous bilateral study completed.  Preliminary results relayed to Schutt, PA.   See CV Proc for preliminary results report.   Erique Kaser, RDMS, RVT  

## 2022-06-03 NOTE — ED Provider Triage Note (Signed)
Emergency Medicine Provider Triage Evaluation Note  Sherri Hart , a 76 y.o. female  was evaluated in triage.  Pt complains of bilateral lower extremity swelling as well as shortness of breath.  Patient has a history of atrial fibrillation and has not been able to afford her Xarelto over the past 3 weeks.  She notes increasing shortness of breath over that amount of time with minimal physical exertion.  Denies overt chest pain.  She was sent by her PCP earlier this morning to the emergency department for further evaluation.    Review of Systems  Positive: See above Negative:   Physical Exam  BP (!) 180/126 (BP Location: Right Arm)   Pulse 87   Temp 98.6 F (37 C) (Oral)   Resp 17   Ht 5\' 7"  (1.702 m)   Wt 111.1 kg   SpO2 94%   BMI 38.37 kg/m  Gen:   Awake, no distress   Resp:  Normal effort  MSK:   Moves extremities without difficulty  Other:  Rales auscultated bilateral lung fields.  2-3+ lower extremity edema noted.  Medical Decision Making  Medically screening exam initiated at 11:52 AM.  Appropriate orders placed.  Sherri Hart was informed that the remainder of the evaluation will be completed by another provider, this initial triage assessment does not replace that evaluation, and the importance of remaining in the ED until their evaluation is complete.     Wilnette Kales, [place] 06/03/22 1153

## 2022-06-03 NOTE — ED Notes (Signed)
Patient transported to CT 

## 2022-06-03 NOTE — ED Notes (Signed)
Discharge instructions reviewed with patient. Patient verbalized understanding of instructions. Follow-up care and medications were reviewed. Patient ambulatory with steady gait. VSS upon discharge.  ?

## 2022-06-20 ENCOUNTER — Encounter (HOSPITAL_COMMUNITY): Payer: Self-pay

## 2022-06-20 ENCOUNTER — Ambulatory Visit (HOSPITAL_COMMUNITY): Payer: Medicare HMO | Admitting: Nurse Practitioner

## 2022-09-04 ENCOUNTER — Other Ambulatory Visit: Payer: Self-pay | Admitting: Family Medicine

## 2022-09-04 DIAGNOSIS — Z1231 Encounter for screening mammogram for malignant neoplasm of breast: Secondary | ICD-10-CM

## 2022-10-17 ENCOUNTER — Encounter (HOSPITAL_COMMUNITY): Payer: Self-pay | Admitting: *Deleted

## 2022-10-18 IMAGING — DX DG KNEE 3 VIEWS*R*
3 series · 3 of 3 positions shown · non-contrast
Comparison: Plain films right knee 02/01/2021.

CLINICAL DATA: Right knee pain inferior to the patella for
approximately 6 months. No known injury.

EXAM:
RIGHT KNEE - 3 VIEW

[dg knee 3 views right (1 of 3)]
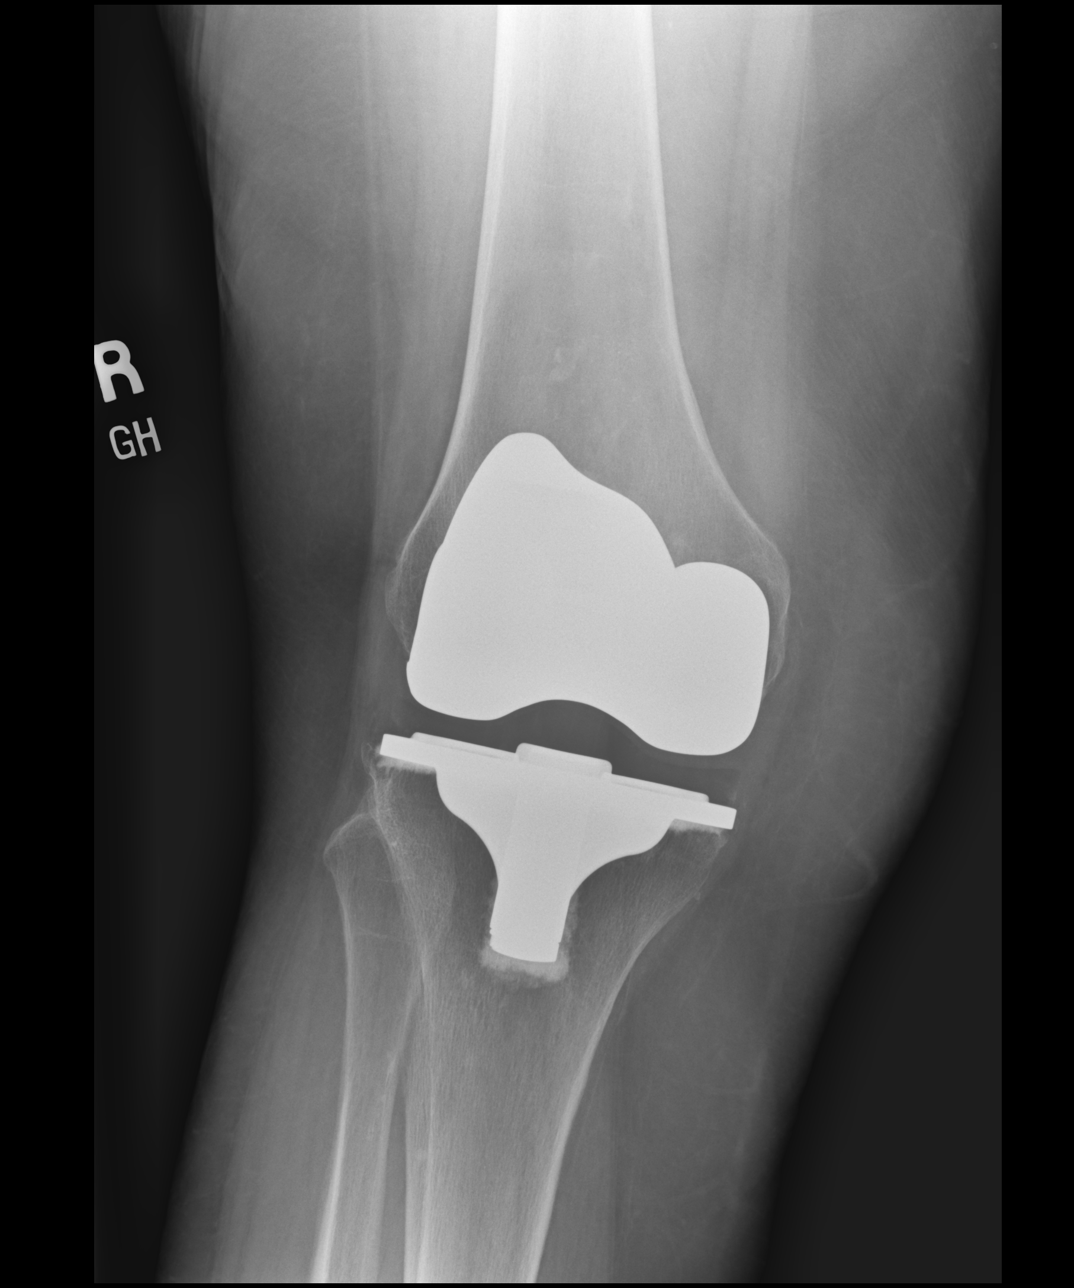

[dg knee 3 views right (2 of 3)]
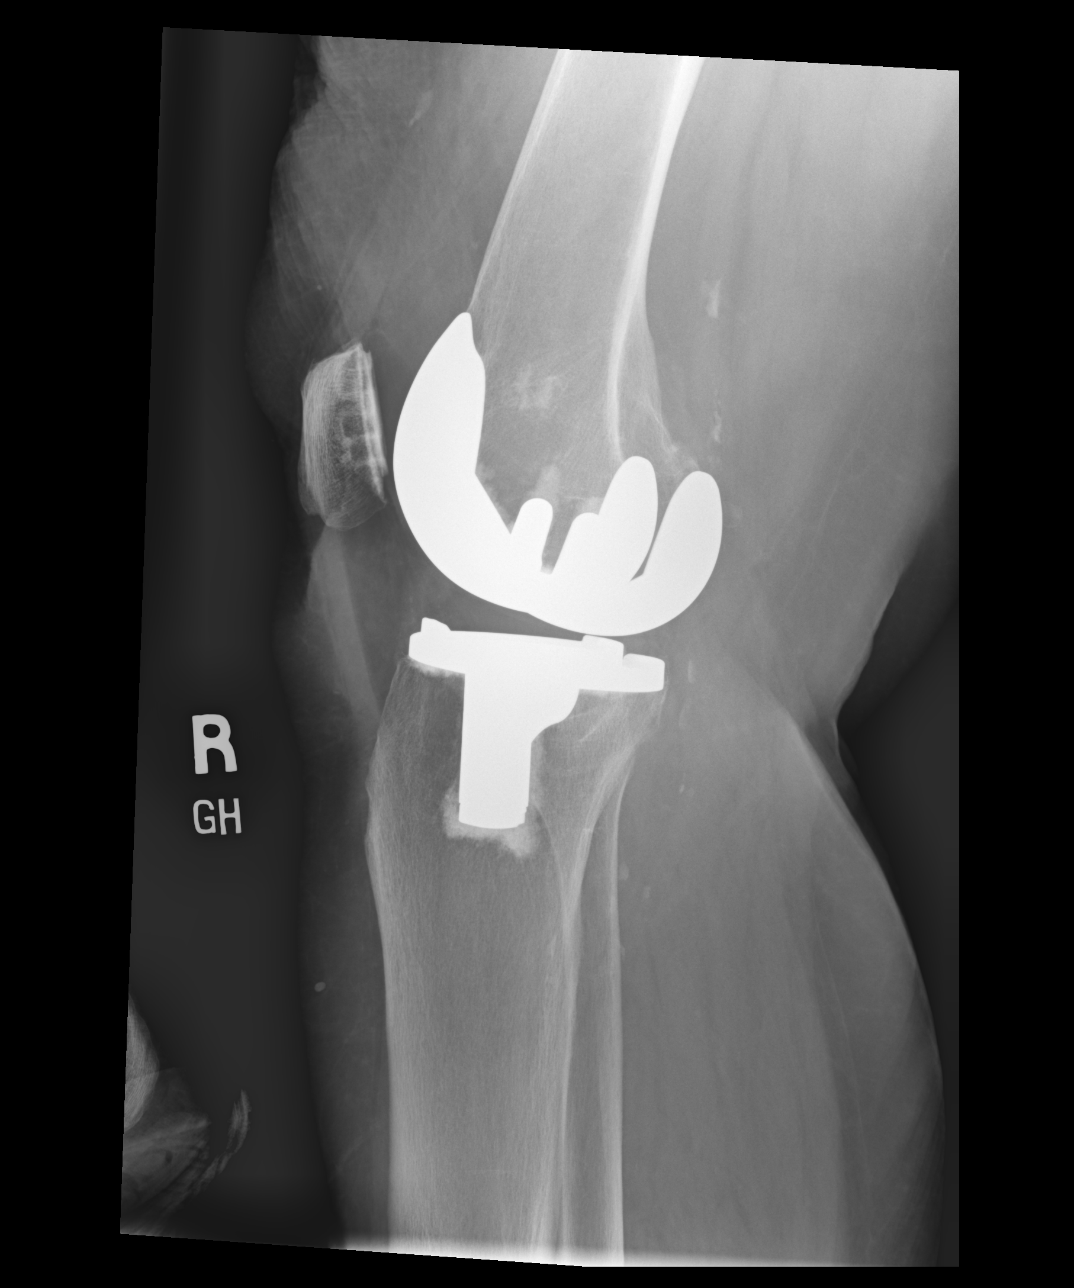

[dg knee 3 views right (3 of 3)]
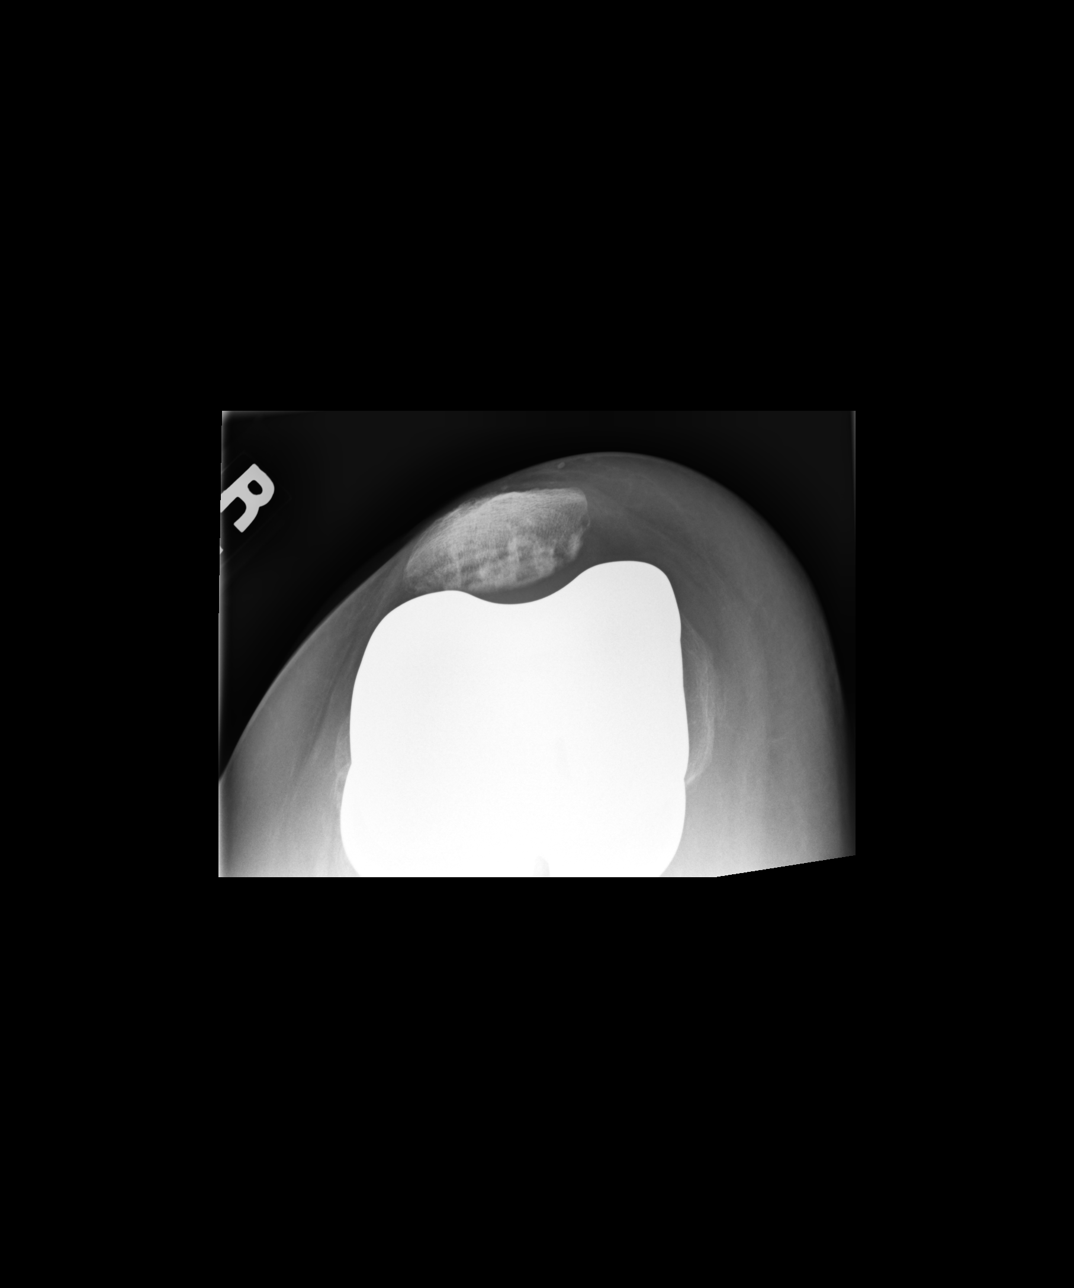

[3 of 3 positions shown; findings below may reference images not displayed]

FINDINGS: Total arthroplasty is in place. No hardware complication is
identified. No acute bony or joint abnormality is seen. No joint
effusion. Soft tissues demonstrate atherosclerosis.
IMPRESSION: Status post right knee replacement. No acute abnormality or finding
to explain the patient's symptoms. Stable compared to prior exam.

## 2022-11-01 ENCOUNTER — Other Ambulatory Visit: Payer: Self-pay | Admitting: Physician Assistant

## 2022-11-01 DIAGNOSIS — I4821 Permanent atrial fibrillation: Secondary | ICD-10-CM

## 2022-11-01 NOTE — Telephone Encounter (Signed)
Pt has scheduled appt with Eulas Post, PA on 11/11/22. Refill sent.

## 2022-11-01 NOTE — Telephone Encounter (Signed)
Prescription refill request for Xarelto received.  Indication: Afib  Last office visit: 04/19/21 Eulas Post)  Weight: 111.1kg Age: 77 Scr: 1.05 (06/03/22)  CrCl: 82.33m/min  Overdue to see provider. Called and spoke with pt's granddaughter, MUrban Gibson Made her ware of overdue appt. Transferred to scheduling and will wait for appt to be scheduled to refill medication.

## 2022-11-11 ENCOUNTER — Ambulatory Visit: Payer: Medicare HMO | Admitting: Physician Assistant

## 2022-12-16 ENCOUNTER — Ambulatory Visit: Payer: Medicare HMO | Attending: Physician Assistant | Admitting: Nurse Practitioner

## 2022-12-16 NOTE — Progress Notes (Deleted)
Office Visit    Patient Name: Joanna HewsDiana Murphy Block Date of Encounter: 12/16/2022  Primary Care Provider:  Ellyn HackShah, Syed Asad A, MD Primary Cardiologist:  Chrystie NoseKenneth C Hilty, MD  Chief Complaint    77 year old female with a history of permanent atrial fibrillation, chronic diastolic heart failure, COPD on home O2, arthritis, and anxiety who presents for follow-up related to heart failure and atrial fibrillation.  Past Medical History    Past Medical History:  Diagnosis Date   Anxiety    Arthritis    Atrial fibrillation (HCC)    Complication of anesthesia    used to get migraine headaches after surgery   Congestive heart failure (HCC)    COPD (chronic obstructive pulmonary disease) (HCC)    Dyspnea    if afib causes a problem   Pneumonia    Past Surgical History:  Procedure Laterality Date   BACK SURGERY     CHOLECYSTECTOMY     NECK SURGERY     REPLACEMENT TOTAL KNEE Right    TOTAL KNEE REVISION Right 06/27/2021   Procedure: TOTAL KNEE REVISION;  Surgeon: Ollen GrossAluisio, Frank, MD;  Location: [facility];  Service: Orthopedics;  Laterality: Right;   TUBAL LIGATION      Allergies  Allergies  Allergen Reactions   Aspirin Other (See Comments)    Stomach ache    Penicillins Swelling and Other (See Comments)    Swollen lips and welts + Pt stated that she can take keflex++  Did it involve swelling of the face/tongue/throat, SOB, or low BP? Yes Did it involve sudden or severe rash/hives, skin peeling, or any reaction on the inside of your mouth or nose? N Did you need to seek medical attention at a hospital or doctor's office? Y When did it last happen? childhood      If all above answers are "NO", may proceed with cephalosporin use.      Labs/Other Studies Reviewed    The following studies were reviewed today:  Echo 07/05/2020: 1. Left ventricular ejection fraction, by estimation, is 55 to 60%. The  left ventricle has normal function. The left ventricle has no regional  wall  motion abnormalities. There is mild concentric left ventricular  hypertrophy. Left ventricular diastolic  function could not be evaluated.   2. Right ventricular systolic function is mildly reduced. The right  ventricular size is mildly enlarged. There is mildly elevated pulmonary  artery systolic pressure. The estimated right ventricular systolic  pressure is 37.2 mmHg.   3. Left atrial size was severely dilated.   4. Right atrial size was severely dilated.   5. A small pericardial effusion is present.   6. The mitral valve is normal in structure. Trivial mitral valve  regurgitation. No evidence of mitral stenosis.   7. The aortic valve is tricuspid. There is mild calcification of the  aortic valve. Aortic valve regurgitation is not visualized. Mild aortic  valve sclerosis is present, with no evidence of aortic valve stenosis.   8. The inferior vena cava is dilated in size with >50% respiratory  variability, suggesting right atrial pressure of 8 mmHg.   Comparison(s): No prior Echocardiogram.   Conclusion(s)/Recommendation(s): Normal LVEF with severe biatrial  enlargement, mildly elevated RVSP/mildly decreased RV function, no  significant valve disease.   Recent Labs: 06/03/2022: ALT 10; B Natriuretic Peptide 259.2; BUN 14; Creatinine, Ser 1.05; Hemoglobin 15.3; Platelets 181; Potassium 4.2; Sodium 140  Recent Lipid Panel No results found for: "CHOL", "TRIG", "HDL", "CHOLHDL", "VLDL", "LDLCALC", "LDLDIRECT"  History of Present Illness    77 year old female with the above past medical history including permanent atrial fibrillation, chronic diastolic heart failure, COPD on home O2, arthritis, and anxiety.  Echocardiogram in October 2021 showed EF 55 to 60%, mild RV dysfunction, PASP 37 elevated mercury, severe BAE.  She was hospitalized in May 2022 with shortness of breath, dyspnea on exertion, mechanical fall.  She was found to be in atrial fibrillation with RVR.  Cardiology was  consulted.  She was also treated for COPD exacerbation.  She was last seen in the office on 04/19/2021 and was stable from a cardiac standpoint.  She was referred to pulmonology.  She has not been seen in follow-up since.  She was hospitalized in January 2023 in the setting of COPD exacerbation community-acquired pneumonia.  She was seen in the ED on 06/03/2022 with increased bilateral lower extremity edema in the setting of missed doses of Lasix.  Bilateral lower extremity ultrasound was negative for DVT.  Chest x-ray showed mild interstitial edema.  She was given 1 dose of IV Lasix and treated for UTI.  She was advised to follow-up with cardiology as an outpatient.  She presents today for follow-up.  Since her last visit  Chronic diastolic heart failure: Permanent atrial fibrillation: COPD: Disposition:   Home Medications    Current Outpatient Medications  Medication Sig Dispense Refill   acetaminophen (TYLENOL) 500 MG tablet Take 500-1,000 mg by mouth every 6 (six) hours as needed for mild pain or headache.     albuterol (VENTOLIN HFA) 108 (90 Base) MCG/ACT inhaler Inhale 2 puffs into the lungs every 6 (six) hours as needed for wheezing or shortness of breath. 8 g 2   alendronate (FOSAMAX) 70 MG tablet Take 70 mg by mouth every Saturday. Take with a full glass of water on an empty stomach.     diltiazem (CARDIZEM CD) 180 MG 24 hr capsule Take 1 capsule (180 mg total) by mouth daily. 90 each 1   Doxylamine-Phenylephrine-APAP (SINUS & CONGESTION DAY/NIGHT) MISC Take 1 capsule by mouth every 6 (six) hours as needed (for congestion).     ergocalciferol (VITAMIN D2) 1.25 MG (50000 UT) capsule Take 50,000 Units by mouth every Saturday.     furosemide (LASIX) 20 MG tablet Take 20 mg by mouth in the morning.     furosemide (LASIX) 40 MG tablet Take 1 tablet (40 mg total) by mouth daily. (Patient not taking: Reported on 06/03/2022) 90 tablet 1   guaiFENesin (MUCINEX) 600 MG 12 hr tablet Take 1 tablet  (600 mg total) by mouth 2 (two) times daily. (Patient not taking: Reported on 06/03/2022) 10 tablet 0   loratadine (CLARITIN) 10 MG tablet Take 10 mg by mouth daily.     naproxen sodium (ALEVE) 220 MG tablet Take 220-440 mg by mouth 2 (two) times daily as needed (for pain).     nicotine (NICODERM CQ - DOSED IN MG/24 HOURS) 14 mg/24hr patch Place 1 patch (14 mg total) onto the skin daily. (Patient not taking: Reported on 06/03/2022) 28 patch 0   oxyCODONE (OXY IR/ROXICODONE) 5 MG immediate release tablet Take 1-2 tablets (5-10 mg total) by mouth every 6 (six) hours as needed for severe pain. (Patient not taking: Reported on 06/03/2022) 42 tablet 0   pravastatin (PRAVACHOL) 20 MG tablet Take 20 mg by mouth at bedtime.     predniSONE (DELTASONE) 20 MG tablet Take 2 tablets (40 mg total) by mouth daily with breakfast. (Patient not taking: Reported on  06/03/2022) 2 tablet 0   rivaroxaban (XARELTO) 20 MG TABS tablet TAKE 1 TABLET(20 MG) BY MOUTH DAILY WITH SUPPER 30 tablet 2   umeclidinium-vilanterol (ANORO ELLIPTA) 62.5-25 MCG/ACT AEPB Inhale 1 puff into the lungs daily. (Patient not taking: Reported on 06/03/2022) 14 each 1   No current facility-administered medications for this visit.     Review of Systems    ***.  All other systems reviewed and are otherwise negative except as noted above.    Physical Exam    VS:  There were no vitals taken for this visit. , BMI There is no height or weight on file to calculate BMI.     GEN: Well nourished, well developed, in no acute distress. HEENT: normal. Neck: Supple, no JVD, carotid bruits, or masses. Cardiac: RRR, no murmurs, rubs, or gallops. No clubbing, cyanosis, edema.  Radials/DP/PT 2+ and equal bilaterally.  Respiratory:  Respirations regular and unlabored, clear to auscultation bilaterally. GI: Soft, nontender, nondistended, BS + x 4. MS: no deformity or atrophy. Skin: warm and dry, no rash. Neuro:  Strength and sensation are intact. Psych:  Normal affect.  Accessory Clinical Findings    ECG personally reviewed by me today - *** - no acute changes.   Lab Results  Component Value Date   WBC 7.7 06/03/2022   HGB 15.3 (H) 06/03/2022   HCT 46.2 (H) 06/03/2022   MCV 101.1 (H) 06/03/2022   PLT 181 06/03/2022   Lab Results  Component Value Date   CREATININE 1.05 (H) 06/03/2022   BUN 14 06/03/2022   NA 140 06/03/2022   K 4.2 06/03/2022   CL 105 06/03/2022   CO2 27 06/03/2022   Lab Results  Component Value Date   ALT 10 06/03/2022   AST 16 06/03/2022   ALKPHOS 66 06/03/2022   BILITOT 1.2 06/03/2022   No results found for: "CHOL", "HDL", "LDLCALC", "LDLDIRECT", "TRIG", "CHOLHDL"  Lab Results  Component Value Date   HGBA1C 5.0 09/08/2021    Assessment & Plan    1.  ***  No BP recorded.  {Refresh Note OR Click here to enter BP  :1}***   Joylene Grapes, NP 12/16/2022, 1:25 PM

## 2023-02-26 ENCOUNTER — Other Ambulatory Visit: Payer: Self-pay

## 2023-02-26 ENCOUNTER — Observation Stay (HOSPITAL_COMMUNITY)
Admission: EM | Admit: 2023-02-26 | Discharge: 2023-03-01 | Disposition: A | Discharge status: Home / Self Care | Payer: Medicare HMO | Attending: Internal Medicine | Admitting: Internal Medicine

## 2023-02-26 ENCOUNTER — Encounter (HOSPITAL_COMMUNITY): Payer: Self-pay

## 2023-02-26 ENCOUNTER — Emergency Department (HOSPITAL_COMMUNITY): Payer: Medicare HMO

## 2023-02-26 DIAGNOSIS — J441 Chronic obstructive pulmonary disease with (acute) exacerbation: Secondary | ICD-10-CM | POA: Insufficient documentation

## 2023-02-26 DIAGNOSIS — I509 Heart failure, unspecified: Secondary | ICD-10-CM | POA: Diagnosis not present

## 2023-02-26 DIAGNOSIS — E785 Hyperlipidemia, unspecified: Secondary | ICD-10-CM | POA: Insufficient documentation

## 2023-02-26 DIAGNOSIS — N390 Urinary tract infection, site not specified: Secondary | ICD-10-CM | POA: Diagnosis not present

## 2023-02-26 DIAGNOSIS — Z7901 Long term (current) use of anticoagulants: Secondary | ICD-10-CM | POA: Insufficient documentation

## 2023-02-26 DIAGNOSIS — Z96651 Presence of right artificial knee joint: Secondary | ICD-10-CM | POA: Insufficient documentation

## 2023-02-26 DIAGNOSIS — J189 Pneumonia, unspecified organism: Principal | ICD-10-CM | POA: Insufficient documentation

## 2023-02-26 DIAGNOSIS — F17210 Nicotine dependence, cigarettes, uncomplicated: Secondary | ICD-10-CM | POA: Diagnosis not present

## 2023-02-26 DIAGNOSIS — I480 Paroxysmal atrial fibrillation: Secondary | ICD-10-CM | POA: Diagnosis not present

## 2023-02-26 DIAGNOSIS — Z79899 Other long term (current) drug therapy: Secondary | ICD-10-CM | POA: Diagnosis not present

## 2023-02-26 DIAGNOSIS — J9601 Acute respiratory failure with hypoxia: Secondary | ICD-10-CM | POA: Diagnosis not present

## 2023-02-26 DIAGNOSIS — R1030 Lower abdominal pain, unspecified: Secondary | ICD-10-CM | POA: Diagnosis present

## 2023-02-26 LAB — URINALYSIS, ROUTINE W REFLEX MICROSCOPIC
Bilirubin Urine: NEGATIVE
Glucose, UA: NEGATIVE mg/dL
Hgb urine dipstick: NEGATIVE
Ketones, ur: NEGATIVE mg/dL
Leukocytes,Ua: NEGATIVE
Nitrite: POSITIVE — AB
Protein, ur: 30 mg/dL — AB
Specific Gravity, Urine: 1.024 (ref 1.005–1.030)
pH: 5 (ref 5.0–8.0)

## 2023-02-26 LAB — CBC WITH DIFFERENTIAL/PLATELET
Abs Immature Granulocytes: 0.03 10*3/uL (ref 0.00–0.07)
Basophils Absolute: 0 10*3/uL (ref 0.0–0.1)
Basophils Relative: 1 %
Eosinophils Absolute: 0.1 10*3/uL (ref 0.0–0.5)
Eosinophils Relative: 2 %
HCT: 45.2 % (ref 36.0–46.0)
Hemoglobin: 14.7 g/dL (ref 12.0–15.0)
Immature Granulocytes: 0 %
Lymphocytes Relative: 26 %
Lymphs Abs: 2.1 10*3/uL (ref 0.7–4.0)
MCH: 34.3 pg — ABNORMAL HIGH (ref 26.0–34.0)
MCHC: 32.5 g/dL (ref 30.0–36.0)
MCV: 105.6 fL — ABNORMAL HIGH (ref 80.0–100.0)
Monocytes Absolute: 0.6 10*3/uL (ref 0.1–1.0)
Monocytes Relative: 8 %
Neutro Abs: 5 10*3/uL (ref 1.7–7.7)
Neutrophils Relative %: 63 %
Platelets: 159 10*3/uL (ref 150–400)
RBC: 4.28 MIL/uL (ref 3.87–5.11)
RDW: 13.8 % (ref 11.5–15.5)
WBC: 8 10*3/uL (ref 4.0–10.5)
nRBC: 0 % (ref 0.0–0.2)

## 2023-02-26 LAB — COMPREHENSIVE METABOLIC PANEL
ALT: 10 U/L (ref 0–44)
AST: 17 U/L (ref 15–41)
Albumin: 4.1 g/dL (ref 3.5–5.0)
Alkaline Phosphatase: 41 U/L (ref 38–126)
Anion gap: 9 (ref 5–15)
BUN: 17 mg/dL (ref 8–23)
CO2: 24 mmol/L (ref 22–32)
Calcium: 8.9 mg/dL (ref 8.9–10.3)
Chloride: 106 mmol/L (ref 98–111)
Creatinine, Ser: 1.02 mg/dL — ABNORMAL HIGH (ref 0.44–1.00)
GFR, Estimated: 58 mL/min — ABNORMAL LOW (ref >60)
Glucose, Bld: 118 mg/dL — ABNORMAL HIGH (ref 70–99)
Potassium: 3.8 mmol/L (ref 3.5–5.1)
Sodium: 139 mmol/L (ref 135–145)
Total Bilirubin: 0.8 mg/dL (ref 0.3–1.2)
Total Protein: 7 g/dL (ref 6.5–8.1)

## 2023-02-26 LAB — LIPASE, BLOOD: Lipase: 32 U/L (ref 11–51)

## 2023-02-26 MED ORDER — SODIUM CHLORIDE 0.9 % IV SOLN
1.0000 g | INTRAVENOUS | Status: DC
  Administered 2023-02-27 – 2023-03-01 (×3): 1 g via INTRAVENOUS
  Filled 2023-02-26 (×3): qty 10

## 2023-02-26 MED ORDER — TRAZODONE HCL 50 MG PO TABS
25.0000 mg | ORAL_TABLET | Freq: Every evening | ORAL | Status: DC | PRN
  Administered 2023-02-26 – 2023-02-28 (×3): 25 mg via ORAL
  Filled 2023-02-26 (×3): qty 1

## 2023-02-26 MED ORDER — RIVAROXABAN 20 MG PO TABS
20.0000 mg | ORAL_TABLET | Freq: Every day | ORAL | Status: DC
  Administered 2023-02-26 – 2023-02-28 (×3): 20 mg via ORAL
  Filled 2023-02-26 (×3): qty 1

## 2023-02-26 MED ORDER — PRAVASTATIN SODIUM 20 MG PO TABS
20.0000 mg | ORAL_TABLET | Freq: Every day | ORAL | Status: DC
  Administered 2023-02-26 – 2023-02-28 (×3): 20 mg via ORAL
  Filled 2023-02-26 (×3): qty 1

## 2023-02-26 MED ORDER — METHYLPREDNISOLONE SODIUM SUCC 125 MG IJ SOLR
125.0000 mg | Freq: Once | INTRAMUSCULAR | Status: AC
  Administered 2023-02-26: 125 mg via INTRAVENOUS
  Filled 2023-02-26: qty 2

## 2023-02-26 MED ORDER — CEPHALEXIN 500 MG PO CAPS: 500.0000 mg | ORAL_CAPSULE | Freq: Two times a day (BID) | ORAL | 0 refills | Status: DC

## 2023-02-26 MED ORDER — SODIUM CHLORIDE 0.9 % IV SOLN
500.0000 mg | INTRAVENOUS | Status: DC
  Administered 2023-02-27 – 2023-03-01 (×3): 500 mg via INTRAVENOUS
  Filled 2023-02-26 (×3): qty 5

## 2023-02-26 MED ORDER — AZITHROMYCIN 250 MG PO TABS: 250.0000 mg | ORAL_TABLET | Freq: Every day | ORAL | 0 refills | Status: DC

## 2023-02-26 MED ORDER — PREDNISONE 20 MG PO TABS
40.0000 mg | ORAL_TABLET | Freq: Every day | ORAL | Status: DC
  Administered 2023-02-27 – 2023-03-01 (×3): 40 mg via ORAL
  Filled 2023-02-26 (×3): qty 2

## 2023-02-26 MED ORDER — ALBUTEROL SULFATE (2.5 MG/3ML) 0.083% IN NEBU: 2.5000 mg | INHALATION_SOLUTION | RESPIRATORY_TRACT | Status: DC | PRN

## 2023-02-26 MED ORDER — ONDANSETRON HCL 4 MG PO TABS: 4.0000 mg | ORAL_TABLET | Freq: Four times a day (QID) | ORAL | Status: DC | PRN

## 2023-02-26 MED ORDER — IPRATROPIUM-ALBUTEROL 0.5-2.5 (3) MG/3ML IN SOLN
3.0000 mL | Freq: Four times a day (QID) | RESPIRATORY_TRACT | Status: DC
  Administered 2023-02-26: 3 mL via RESPIRATORY_TRACT
  Filled 2023-02-26: qty 3

## 2023-02-26 MED ORDER — ONDANSETRON HCL 4 MG/2ML IJ SOLN: 4.0000 mg | Freq: Four times a day (QID) | INTRAMUSCULAR | Status: DC | PRN

## 2023-02-26 MED ORDER — SODIUM CHLORIDE 0.9 % IV SOLN: INTRAVENOUS | Status: DC

## 2023-02-26 MED ORDER — ONDANSETRON HCL 4 MG/2ML IJ SOLN
4.0000 mg | Freq: Once | INTRAMUSCULAR | Status: AC
  Administered 2023-02-26: 4 mg via INTRAVENOUS
  Filled 2023-02-26: qty 2

## 2023-02-26 MED ORDER — HYDROMORPHONE HCL 1 MG/ML IJ SOLN
1.0000 mg | Freq: Once | INTRAMUSCULAR | Status: AC
  Administered 2023-02-26: 1 mg via INTRAVENOUS
  Filled 2023-02-26: qty 1

## 2023-02-26 MED ORDER — ACETAMINOPHEN 650 MG RE SUPP: 650.0000 mg | Freq: Four times a day (QID) | RECTAL | Status: DC | PRN

## 2023-02-26 MED ORDER — GUAIFENESIN ER 600 MG PO TB12
600.0000 mg | ORAL_TABLET | Freq: Two times a day (BID) | ORAL | Status: DC
  Administered 2023-02-26 – 2023-03-01 (×6): 600 mg via ORAL
  Filled 2023-02-26 (×6): qty 1

## 2023-02-26 MED ORDER — LORATADINE 10 MG PO TABS
10.0000 mg | ORAL_TABLET | Freq: Every day | ORAL | Status: DC
  Administered 2023-02-26 – 2023-03-01 (×4): 10 mg via ORAL
  Filled 2023-02-26 (×4): qty 1

## 2023-02-26 MED ORDER — SENNOSIDES-DOCUSATE SODIUM 8.6-50 MG PO TABS: 1.0000 | ORAL_TABLET | Freq: Every evening | ORAL | Status: DC | PRN

## 2023-02-26 MED ORDER — ACETAMINOPHEN 325 MG PO TABS: 650.0000 mg | ORAL_TABLET | Freq: Four times a day (QID) | ORAL | Status: DC | PRN

## 2023-02-26 MED ORDER — SODIUM CHLORIDE 0.9 % IV SOLN
500.0000 mg | Freq: Once | INTRAVENOUS | Status: AC
  Administered 2023-02-26: 500 mg via INTRAVENOUS
  Filled 2023-02-26: qty 5

## 2023-02-26 MED ORDER — FENTANYL CITRATE PF 50 MCG/ML IJ SOSY
25.0000 ug | PREFILLED_SYRINGE | Freq: Once | INTRAMUSCULAR | Status: AC
  Administered 2023-02-26: 25 ug via INTRAVENOUS
  Filled 2023-02-26: qty 1

## 2023-02-26 MED ORDER — SODIUM CHLORIDE 0.9 % IV SOLN
1.0000 g | Freq: Once | INTRAVENOUS | Status: AC
  Administered 2023-02-26: 1 g via INTRAVENOUS
  Filled 2023-02-26: qty 10

## 2023-02-26 MED ORDER — OXYCODONE HCL 5 MG PO TABS
2.5000 mg | ORAL_TABLET | ORAL | Status: DC | PRN
  Administered 2023-02-26 – 2023-02-27 (×3): 2.5 mg via ORAL
  Filled 2023-02-26 (×3): qty 1

## 2023-02-26 MED ORDER — NICOTINE 14 MG/24HR TD PT24
14.0000 mg | MEDICATED_PATCH | Freq: Once | TRANSDERMAL | Status: AC
  Administered 2023-02-26: 14 mg via TRANSDERMAL
  Filled 2023-02-26: qty 1

## 2023-02-26 MED ORDER — IPRATROPIUM-ALBUTEROL 0.5-2.5 (3) MG/3ML IN SOLN: 3.0000 mL | Freq: Three times a day (TID) | RESPIRATORY_TRACT | Status: DC

## 2023-02-26 MED ORDER — DILTIAZEM HCL ER COATED BEADS 180 MG PO CP24
180.0000 mg | ORAL_CAPSULE | Freq: Every day | ORAL | Status: DC
  Administered 2023-02-27 – 2023-03-01 (×3): 180 mg via ORAL
  Filled 2023-02-26 (×4): qty 1

## 2023-02-26 MED ORDER — IPRATROPIUM-ALBUTEROL 0.5-2.5 (3) MG/3ML IN SOLN
3.0000 mL | Freq: Once | RESPIRATORY_TRACT | Status: AC
  Administered 2023-02-26: 3 mL via RESPIRATORY_TRACT
  Filled 2023-02-26: qty 3

## 2023-02-26 MED ORDER — METHOCARBAMOL 1000 MG/10ML IJ SOLN: 500.0000 mg | Freq: Four times a day (QID) | INTRAVENOUS | Status: DC | PRN

## 2023-02-26 MED ORDER — OXYBUTYNIN CHLORIDE 5 MG PO TABS: 5.0000 mg | ORAL_TABLET | Freq: Three times a day (TID) | ORAL | Status: DC | PRN

## 2023-02-26 NOTE — ED Provider Notes (Addendum)
[place] EMERGENCY DEPARTMENT AT [facility] Provider Note   CSN: [id number] Arrival date & time: 02/26/23  0945     History  Chief Complaint  Patient presents with   Abdominal Pain    Sherri Hart Block is a 77 y.o. female.  HPI Patient presents via EMS with abdominal pain, dyspnea.  History is obtained with patient and EMS. Patient notes that over the past few days she has developed increasingly severe lower abdominal pain with, but not dysuria. She has transition to wearing a diaper, using OTC medication for presumed UTI.  She has not seen a physician. She notes that with worsening pain she is also become short of breath, though has no chest pain, nor fever. EMS reports the patient was dyspneic, wheezing on exam, received bronchodilator.     Home Medications Prior to Admission medications   Medication Sig Start Date End Date Taking? Authorizing Provider  azithromycin (ZITHROMAX) 250 MG tablet Take 1 tablet (250 mg total) by mouth daily for 4 days. Take 1 every day until finished. 02/26/23 03/02/23 Yes Gerhard Munch, MD  cephALEXin (KEFLEX) 500 MG capsule Take 1 capsule (500 mg total) by mouth 2 (two) times daily for 5 days. 02/26/23 03/03/23 Yes Gerhard Munch, MD  acetaminophen (TYLENOL) 500 MG tablet Take 500-1,000 mg by mouth every 6 (six) hours as needed for mild pain or headache.    [provider]  albuterol (VENTOLIN HFA) 108 (90 Base) MCG/ACT inhaler Inhale 2 puffs into the lungs every 6 (six) hours as needed for wheezing or shortness of breath. 09/10/21   Andrey Campanile, MD  alendronate (FOSAMAX) 70 MG tablet Take 70 mg by mouth every Saturday. Take with a full glass of water on an empty stomach.    [provider]  diltiazem (CARDIZEM CD) 180 MG 24 hr capsule Take 1 capsule (180 mg total) by mouth daily. 02/04/21   Almon Hercules, MD  Doxylamine-Phenylephrine-APAP (SINUS & CONGESTION DAY/NIGHT) MISC Take 1 capsule by mouth every 6 (six)  hours as needed (for congestion).    [provider]  ergocalciferol (VITAMIN D2) 1.25 MG (50000 UT) capsule Take 50,000 Units by mouth every Saturday.    [provider]  furosemide (LASIX) 20 MG tablet Take 20 mg by mouth in the morning.    [provider]  furosemide (LASIX) 40 MG tablet Take 1 tablet (40 mg total) by mouth daily. Patient not taking: Reported on 06/03/2022 02/04/21   Almon Hercules, MD  guaiFENesin (MUCINEX) 600 MG 12 hr tablet Take 1 tablet (600 mg total) by mouth 2 (two) times daily. Patient not taking: Reported on 06/03/2022 09/10/21   Andrey Campanile, MD  loratadine (CLARITIN) 10 MG tablet Take 10 mg by mouth daily.    [provider]  naproxen sodium (ALEVE) 220 MG tablet Take 220-440 mg by mouth 2 (two) times daily as needed (for pain).    [provider]  nicotine (NICODERM CQ - DOSED IN MG/24 HOURS) 14 mg/24hr patch Place 1 patch (14 mg total) onto the skin daily. Patient not taking: Reported on 06/03/2022 09/11/21   Andrey Campanile, MD  oxyCODONE (OXY IR/ROXICODONE) 5 MG immediate release tablet Take 1-2 tablets (5-10 mg total) by mouth every 6 (six) hours as needed for severe pain. Patient not taking: Reported on 06/03/2022 07/04/21   Nelia Shi D, PA-C  pravastatin (PRAVACHOL) 20 MG tablet Take 20 mg by mouth at bedtime.    [provider]  predniSONE (DELTASONE) 20 MG tablet Take 2 tablets (40 mg total) by mouth daily with breakfast. Patient not taking: Reported on 06/03/2022 09/11/21   Andrey Campanile, MD  rivaroxaban (XARELTO) 20 MG TABS tablet TAKE 1 TABLET(20 MG) BY MOUTH DAILY WITH SUPPER 11/01/22   Hilty, Lisette Abu, MD  umeclidinium-vilanterol (ANORO ELLIPTA) 62.5-25 MCG/ACT AEPB Inhale 1 puff into the lungs daily. Patient not taking: Reported on 06/03/2022 09/10/21   Andrey Campanile, MD      Allergies    Aspirin and Penicillins    Review of Systems   Review of Systems  All other systems reviewed  and are negative.   Physical Exam Updated Vital Signs BP (!) 152/137   Pulse (!) 101   Temp 97.6 F (36.4 C) (Oral)   Resp 18   Ht 5\' 7"  (1.702 m)   Wt 103.9 kg   SpO2 90%   BMI 35.87 kg/m  Physical Exam Vitals and nursing note reviewed.  Constitutional:      General: She is in acute distress.     Appearance: She is well-developed. She is ill-appearing.  HENT:     Head: Normocephalic and atraumatic.  Eyes:     Conjunctiva/sclera: Conjunctivae normal.  Cardiovascular:     Rate and Rhythm: Regular rhythm. Tachycardia present.  Pulmonary:     Breath sounds: No stridor. Wheezing present.  Abdominal:     General: There is no distension.     Tenderness: There is abdominal tenderness in the periumbilical area and suprapubic area. There is guarding.  Skin:    General: Skin is warm and dry.  Neurological:     Mental Status: She is alert and oriented to person, place, and time.     Cranial Nerves: No cranial nerve deficit.  Psychiatric:        Mood and Affect: Mood normal.     ED Results / Procedures / Treatments   Labs (all labs ordered are listed, but only abnormal results are displayed) Labs Reviewed  COMPREHENSIVE METABOLIC PANEL - Abnormal; Notable for the following components:      Result Value   Glucose, Bld 118 (*)    Creatinine, Ser 1.02 (*)    GFR, Estimated 58 (*)    All other components within normal limits  CBC WITH DIFFERENTIAL/PLATELET - Abnormal; Notable for the following components:   MCV 105.6 (*)    MCH 34.3 (*)    All other components within normal limits  URINALYSIS, ROUTINE W REFLEX MICROSCOPIC - Abnormal; Notable for the following components:   Protein, ur 30 (*)    Nitrite POSITIVE (*)    Bacteria, UA MANY (*)    All other components within normal limits  LIPASE, BLOOD    EKG EKG Interpretation  Date/Time:  Wednesday February 26 2023 09:57:04 EDT Ventricular Rate:  119 PR Interval:    QRS Duration: 95 QT Interval:  331 QTC  Calculation: 466 R Axis:   70 Text Interpretation: Atrial fibrillation Nonspecific repol abnormality, diffuse leads Abnormal ECG Confirmed by Gerhard Munch [phone]) on 02/26/2023 10:24:01 AM  Radiology CT ABDOMEN PELVIS WO CONTRAST  Result Date: 02/26/2023 CLINICAL DATA:  Acute lower abdominal pain. EXAM: CT ABDOMEN AND PELVIS WITHOUT CONTRAST TECHNIQUE: Multidetector CT imaging of the abdomen and pelvis was performed following the standard protocol without IV contrast. RADIATION DOSE REDUCTION: This exam was performed according to the departmental dose-optimization program which includes automated exposure control, adjustment of the mA and/or kV according to patient size and/or use of  iterative reconstruction technique. COMPARISON:  None Available. FINDINGS: Lower chest: Mild right lower lobe atelectasis or pneumonia is noted. Hepatobiliary: No focal liver abnormality is seen. Status post cholecystectomy. No biliary dilatation. Pancreas: Unremarkable. No pancreatic ductal dilatation or surrounding inflammatory changes. Spleen: Normal in size without focal abnormality. 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 Left renal cyst is noted for which no further follow-up is required. No hydronephrosis or renal obstruction is noted. Urinary bladder is unremarkable. Stomach/Bowel: The stomach is unremarkable. There is no evidence of bowel obstruction or inflammation. Vascular/Lymphatic: Aortic atherosclerosis. No enlarged abdominal or pelvic lymph nodes. Reproductive: Uterus and bilateral adnexa are unremarkable. Other: Small fat containing periumbilical hernia. No ascites is noted. Musculoskeletal: No acute or significant osseous findings. IMPRESSION: Mild right lower lobe pneumonia or atelectasis is noted. Small fat containing periumbilical hernia. No acute abnormality seen in the abdomen or pelvis. Aortic Atherosclerosis (ICD10-I70.0). Electronically Signed   By: Lupita Raider M.D.   On:  02/26/2023 13:28   DG Chest 2 View  Result Date: 02/26/2023 CLINICAL DATA:  Shortness of breath.  Abdominal pain EXAM: CHEST - 2 VIEW COMPARISON:  X-ray 06/03/2022 FINDINGS: Enlarged cardiopericardial silhouette. Calcified aorta. Slight vascular congestion. No pneumothorax or effusion. Overlapping cardiac leads. No edema or consolidation. Film is under penetrated and inflated x-ray. Fixation hardware along the lower cervical spine at the edge of the imaging field. IMPRESSION: Underinflation compared to previous. Enlarged cardiopericardial silhouette with a calcified aorta. Slight vascular congestion. Electronically Signed   By: Karen Kays M.D.   On: 02/26/2023 11:52    Procedures Procedures    Medications Ordered in ED Medications  0.9 %  sodium chloride infusion ( Intravenous New Bag/Given 02/26/23 1114)  cefTRIAXone (ROCEPHIN) 1 g in sodium chloride 0.9 % 100 mL IVPB (1 g Intravenous New Bag/Given 02/26/23 1417)  azithromycin (ZITHROMAX) 500 mg in sodium chloride 0.9 % 250 mL IVPB (has no administration in time range)  fentaNYL (SUBLIMAZE) injection 25 mcg (25 mcg Intravenous Given 02/26/23 1113)  ondansetron (ZOFRAN) injection 4 mg (4 mg Intravenous Given 02/26/23 1112)  methylPREDNISolone sodium succinate (SOLU-MEDROL) 125 mg/2 mL injection 125 mg (125 mg Intravenous Given 02/26/23 1103)  ipratropium-albuterol (DUONEB) 0.5-2.5 (3) MG/3ML nebulizer solution 3 mL (3 mLs Nebulization Given 02/26/23 1106)  HYDROmorphone (DILAUDID) injection 1 mg (1 mg Intravenous Given 02/26/23 1413)    ED Course/ Medical Decision Making/ A&P                             Medical Decision Making Adult female with history of COPD presents with polyuria, lower abdominal pain as well as shortness of breath.  Some suspicion for abdominal pathology contributing to her dyspnea, though she is wheezing, tachypneic. Broad differential including intra-abdominal infection, pneumonia, bacteremia, sepsis. Patient is  afebrile, but does meet SIRS criteria.  Continuous monitoring initiated. Cardiac 110 sinus tach abnormal Pulse ox 95% with supplemental oxygen via nebulizer, borderline   Amount and/or Complexity of Data Reviewed Independent Historian: EMS External Data Reviewed: notes.    Details: Multiple recent ED COPD evaluation Labs: ordered. Decision-making details documented in ED Course. Radiology: ordered and independent interpretation performed. Decision-making details documented in ED Course. ECG/medicine tests: ordered and independent interpretation performed. Decision-making details documented in ED Course.    Details: EMS rhythm strip, afib T wave abnormalities  Risk Prescription drug management. Decision regarding hospitalization. Diagnosis or treatment significantly limited by social determinants of health.   2:19 PM  Patient awake, alert, states that her breathing is unremarkable.  She is on oxygen, but does use this at home as needed.  Saturation on room air is at 80%, and patient requires between 2 and 4 L to achieve appropriate saturation..  She has no leukocytosis is afebrile, is in no distress, and though she may have a lobe pneumonia as well as urinary tract infection, no evidence for bacteremia, sepsis.        Final Clinical Impression(s) / ED Diagnoses Final diagnoses:  Lower urinary tract infectious disease  Community acquired pneumonia of right middle lobe of lung    Rx / DC Orders ED Discharge Orders          Ordered    cephALEXin (KEFLEX) 500 MG capsule  2 times daily        02/26/23 1419    azithromycin (ZITHROMAX) 250 MG tablet  Daily        02/26/23 1419              Gerhard Munch, MD 02/26/23 1420    Gerhard Munch, MD 02/26/23 1423

## 2023-02-26 NOTE — H&P (Signed)
History and Physical  Seria Garnett Block ZOX:[id number] DOB: [birth year] DOA: 02/26/2023  PCP: Ellyn Hack, MD   Chief Complaint: Polyuria, lower abdominal pain, shortness of breath  HPI: Sherri Hart is a 77 y.o. female with medical history significant for tobacco abuse, COPD on as needed oxygen, paroxysmal atrial fibrillation on Cardizem and Xarelto presents to the ER with several days of increasingly severe lower abdominal pain with polyuria, as well as intermittent wheezing and shortness of breath due to congestion; she is being admitted to the hospital with mild COPD exacerbation, community-acquired pneumonia, and suspected urinary tract infection.  History is provided by my discussion with the ER provider, as well as with the patient.  She denies any fevers, no chest pain, no chills.  Specifically denies dysuria, but has been having quite a bit of nonradiating low abdominal pain, and feels like she constantly needs to urinate.  Says she has been congested of late, taking Claritin daily, and intermittently does have a nonproductive cough, and some wheezing.  Per EMS, she was dyspneic, wheezing on exam received bronchodilator prior to arrival here.  ED Course: Patient tachycardic on arrival, blood pressure stable, saturating 80% on room air, requiring 2 to 4 L nasal cannula oxygen to maintain O2 saturations above 90%.  Lab work relatively unrevealing, she has abnormal urinalysis, and CT of the abdomen and pelvis notes possible right lower lobe consolidation.  Given a dose of IV Rocephin, IV azithromycin, as well as IV Solu-Medrol, placed on supplemental oxygen as noted above, and hospitalist was contacted for admission.  Review of Systems: Please see HPI for pertinent positives and negatives. A complete 10 system review of systems are otherwise negative.  Past Medical History:  Diagnosis Date   Anxiety    Arthritis    Atrial fibrillation (HCC)    Complication of anesthesia    used  to get migraine headaches after surgery   Congestive heart failure (HCC)    COPD (chronic obstructive pulmonary disease) (HCC)    Dyspnea    if afib causes a problem   Pneumonia    Past Surgical History:  Procedure Laterality Date   BACK SURGERY     CHOLECYSTECTOMY     NECK SURGERY     REPLACEMENT TOTAL KNEE Right    TOTAL KNEE REVISION Right 06/27/2021   Procedure: TOTAL KNEE REVISION;  Surgeon: Ollen Gross, MD;  Location: [facility];  Service: Orthopedics;  Laterality: Right;   TUBAL LIGATION      Social History:  reports that she has been smoking cigarettes. She has a 26.50 pack-year smoking history. She has never used smokeless tobacco. She reports that she does not drink alcohol and does not use drugs.   Allergies  Allergen Reactions   Aspirin Other (See Comments)    Stomach ache    Penicillins Swelling and Other (See Comments)    Swollen lips and welts + Pt stated that she can take keflex++  Did it involve swelling of the face/tongue/throat, SOB, or low BP? Yes Did it involve sudden or severe rash/hives, skin peeling, or any reaction on the inside of your mouth or nose? N Did you need to seek medical attention at a hospital or doctor's office? Y When did it last happen? childhood      If all above answers are "NO", may proceed with cephalosporin use.     Family History  Problem Relation Age of Onset   COPD Other      Prior to Admission  medications   Medication Sig Start Date End Date Taking? Authorizing Provider  azithromycin (ZITHROMAX) 250 MG tablet Take 1 tablet (250 mg total) by mouth daily for 4 days. Take 1 every day until finished. 02/26/23 03/02/23 Yes Gerhard Munch, MD  cephALEXin (KEFLEX) 500 MG capsule Take 1 capsule (500 mg total) by mouth 2 (two) times daily for 5 days. 02/26/23 03/03/23 Yes Gerhard Munch, MD  acetaminophen (TYLENOL) 500 MG tablet Take 500-1,000 mg by mouth every 6 (six) hours as needed for mild pain or headache.    [provider]  albuterol (VENTOLIN HFA) 108 (90 Base) MCG/ACT inhaler Inhale 2 puffs into the lungs every 6 (six) hours as needed for wheezing or shortness of breath. 09/10/21   Andrey Campanile, MD  alendronate (FOSAMAX) 70 MG tablet Take 70 mg by mouth every Saturday. Take with a full glass of water on an empty stomach.    [provider]  diltiazem (CARDIZEM CD) 180 MG 24 hr capsule Take 1 capsule (180 mg total) by mouth daily. 02/04/21   Almon Hercules, MD  Doxylamine-Phenylephrine-APAP (SINUS & CONGESTION DAY/NIGHT) MISC Take 1 capsule by mouth every 6 (six) hours as needed (for congestion).    [provider]  ergocalciferol (VITAMIN D2) 1.25 MG (50000 UT) capsule Take 50,000 Units by mouth every Saturday.    [provider]  furosemide (LASIX) 20 MG tablet Take 20 mg by mouth in the morning.    [provider]  furosemide (LASIX) 40 MG tablet Take 1 tablet (40 mg total) by mouth daily. Patient not taking: Reported on 06/03/2022 02/04/21   Almon Hercules, MD  guaiFENesin (MUCINEX) 600 MG 12 hr tablet Take 1 tablet (600 mg total) by mouth 2 (two) times daily. Patient not taking: Reported on 06/03/2022 09/10/21   Andrey Campanile, MD  loratadine (CLARITIN) 10 MG tablet Take 10 mg by mouth daily.    [provider]  naproxen sodium (ALEVE) 220 MG tablet Take 220-440 mg by mouth 2 (two) times daily as needed (for pain).    [provider]  nicotine (NICODERM CQ - DOSED IN MG/24 HOURS) 14 mg/24hr patch Place 1 patch (14 mg total) onto the skin daily. Patient not taking: Reported on 06/03/2022 09/11/21   Andrey Campanile, MD  oxyCODONE (OXY IR/ROXICODONE) 5 MG immediate release tablet Take 1-2 tablets (5-10 mg total) by mouth every 6 (six) hours as needed for severe pain. Patient not taking: Reported on 06/03/2022 07/04/21   Nelia Shi D, PA-C  pravastatin (PRAVACHOL) 20 MG tablet Take 20 mg by mouth at bedtime.    [provider]   predniSONE (DELTASONE) 20 MG tablet Take 2 tablets (40 mg total) by mouth daily with breakfast. Patient not taking: Reported on 06/03/2022 09/11/21   Andrey Campanile, MD  rivaroxaban (XARELTO) 20 MG TABS tablet TAKE 1 TABLET(20 MG) BY MOUTH DAILY WITH SUPPER 11/01/22   Hilty, Lisette Abu, MD  umeclidinium-vilanterol (ANORO ELLIPTA) 62.5-25 MCG/ACT AEPB Inhale 1 puff into the lungs daily. Patient not taking: Reported on 06/03/2022 09/10/21   Andrey Campanile, MD    Physical Exam: BP (!) 152/137   Pulse (!) 101   Temp 97.6 F (36.4 C) (Oral)   Resp 18   Ht 5\' 7"  (1.702 m)   Wt 103.9 kg   SpO2 90%   BMI 35.87 kg/m   General:  Alert, oriented, calm, in no acute distress, looks weak/chronically ill Eyes: EOMI, clear conjuctivae, white sclerea  Neck: supple, no masses, trachea mildline  Cardiovascular: RRR, no murmurs or rubs, no peripheral edema  Respiratory: Breath sounds are distant bilaterally, with equal chest rise, intermittent end expiratory wheeze, no tachypnea, no cough or other evidence of respiratory distress  Abdomen: soft, nontender, nondistended, normal bowel tones heard  Skin: dry, no rashes  Musculoskeletal: no joint effusions, normal range of motion  Psychiatric: appropriate affect, normal speech  Neurologic: extraocular muscles intact, clear speech, moving all extremities with intact sensorium          Labs on Admission:  Basic Metabolic Panel: Recent Labs  Lab 02/26/23 1031  NA 139  K 3.8  CL 106  CO2 24  GLUCOSE 118*  BUN 17  CREATININE 1.02*  CALCIUM 8.9   Liver Function Tests: Recent Labs  Lab 02/26/23 1031  AST 17  ALT 10  ALKPHOS 41  BILITOT 0.8  PROT 7.0  ALBUMIN 4.1   Recent Labs  Lab 02/26/23 1031  LIPASE 32   No results for input(s): "AMMONIA" in the last 168 hours. CBC: Recent Labs  Lab 02/26/23 1031  WBC 8.0  NEUTROABS 5.0  HGB 14.7  HCT 45.2  MCV 105.6*  PLT 159   Cardiac Enzymes: No results for input(s): "CKTOTAL",  "CKMB", "CKMBINDEX", "TROPONINI" in the last 168 hours.  BNP (last 3 results) Recent Labs    06/03/22 1155  BNP 259.2*    ProBNP (last 3 results) No results for input(s): "PROBNP" in the last 8760 hours.  CBG: No results for input(s): "GLUCAP" in the last 168 hours.  Radiological Exams on Admission: CT ABDOMEN PELVIS WO CONTRAST  Result Date: 02/26/2023 CLINICAL DATA:  Acute lower abdominal pain. EXAM: CT ABDOMEN AND PELVIS WITHOUT CONTRAST TECHNIQUE: Multidetector CT imaging of the abdomen and pelvis was performed following the standard protocol without IV contrast. RADIATION DOSE REDUCTION: This exam was performed according to the departmental dose-optimization program which includes automated exposure control, adjustment of the mA and/or kV according to patient size and/or use of iterative reconstruction technique. COMPARISON:  None Available. FINDINGS: Lower chest: Mild right lower lobe atelectasis or pneumonia is noted. Hepatobiliary: No focal liver abnormality is seen. Status post cholecystectomy. No biliary dilatation. Pancreas: Unremarkable. No pancreatic ductal dilatation or surrounding inflammatory changes. Spleen: Normal in size without focal abnormality. 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 Left renal cyst is noted for which no further follow-up is required. No hydronephrosis or renal obstruction is noted. Urinary bladder is unremarkable. Stomach/Bowel: The stomach is unremarkable. There is no evidence of bowel obstruction or inflammation. Vascular/Lymphatic: Aortic atherosclerosis. No enlarged abdominal or pelvic lymph nodes. Reproductive: Uterus and bilateral adnexa are unremarkable. Other: Small fat containing periumbilical hernia. No ascites is noted. Musculoskeletal: No acute or significant osseous findings. IMPRESSION: Mild right lower lobe pneumonia or atelectasis is noted. Small fat containing periumbilical hernia. No acute abnormality seen in the  abdomen or pelvis. Aortic Atherosclerosis (ICD10-I70.0). Electronically Signed   By: Lupita Raider M.D.   On: 02/26/2023 13:28   DG Chest 2 View  Result Date: 02/26/2023 CLINICAL DATA:  Shortness of breath.  Abdominal pain EXAM: CHEST - 2 VIEW COMPARISON:  X-ray 06/03/2022 FINDINGS: Enlarged cardiopericardial silhouette. Calcified aorta. Slight vascular congestion. No pneumothorax or effusion. Overlapping cardiac leads. No edema or consolidation. Film is under penetrated and inflated x-ray. Fixation hardware along the lower cervical spine at the edge of the imaging field. IMPRESSION: Underinflation compared to previous. Enlarged cardiopericardial silhouette with a calcified aorta. Slight vascular congestion. Electronically  Signed   By: Karen Kays M.D.   On: 02/26/2023 11:52    Assessment/Plan Ranika Hambley Block is a 77 y.o. female with medical history significant for tobacco abuse, COPD on as needed oxygen, paroxysmal atrial fibrillation on Cardizem and Xarelto being admitted to the hospital with mild COPD exacerbation, community-acquired pneumonia, and suspected urinary tract infection.  Acute hypoxic respiratory failure-patient does have a prior diagnosis of COPD, but uses oxygen only as needed.  Noted to be saturating 80% on room air and dyspneic on EMS arrival.  Acute hypoxic respiratory failure likely due to community-acquired pneumonia and acute exacerbation of COPD, treating as below. -Observation admission -Supplemental oxygen to keep O2 saturation above 90%, wean as tolerated  Community-acquired pneumonia--with acute on chronic hypoxia, abnormal imaging -Continue empiric treatment with IV azithromycin and Rocephin  Acute exacerbation of COPD-mild, but suspected due to wheezing and dyspnea -Received IV steroids in the emergency department -Supplemental oxygen as above -Scheduled DuoNebs, as needed albuterol -Will plan for p.o. prednisone starting in the morning  Urinary tract  infection-patient presented with several days of polyuria, lower abdominal pain, likely due to UTI in the setting of abnormal urinalysis -Treat with empiric IV Rocephin as above -Oxybutynin for bladder spasm  Atrial fibrillation-rate controlled, continue home dose Cardizem and Xarelto  Tobacco abuse-nicotine patch  Hyperlipidemia-pravastatin  DVT prophylaxis: Xarelto    Code Status: Full Code  Consults called: None  Admission status: Observation  Time spent: 52 minutes  Stancil Deisher Sharlette Dense MD [facility] Pager [phone]  If 7PM-7AM, please contact night-coverage [email or web address] Password [facility]  02/26/2023, 3:04 PM

## 2023-02-26 NOTE — ED Triage Notes (Signed)
Patient BIB [facility] from home. For the last week and a half patient has had lower abdominal pain. The last 2 days it has radiated to her lower back. Has urinary frequency (12x a day) and pain. Diarrhea yesterday. Feels like someone is sitting on her bladder. Also complaining of sinus congestion with cough and wheezes.  EMS 10mg  albuterol 0.5 atrovent

## 2023-02-26 NOTE — Plan of Care (Signed)

## 2023-02-27 DIAGNOSIS — J189 Pneumonia, unspecified organism: Secondary | ICD-10-CM | POA: Diagnosis not present

## 2023-02-27 LAB — BASIC METABOLIC PANEL
Anion gap: 9 (ref 5–15)
BUN: 19 mg/dL (ref 8–23)
CO2: 23 mmol/L (ref 22–32)
Calcium: 8.1 mg/dL — ABNORMAL LOW (ref 8.9–10.3)
Chloride: 106 mmol/L (ref 98–111)
Creatinine, Ser: 0.99 mg/dL (ref 0.44–1.00)
GFR, Estimated: 60 mL/min — ABNORMAL LOW (ref >60)
Glucose, Bld: 148 mg/dL — ABNORMAL HIGH (ref 70–99)
Potassium: 4.3 mmol/L (ref 3.5–5.1)
Sodium: 138 mmol/L (ref 135–145)

## 2023-02-27 LAB — CBC
HCT: 41.4 % (ref 36.0–46.0)
Hemoglobin: 13 g/dL (ref 12.0–15.0)
MCH: 34 pg (ref 26.0–34.0)
MCHC: 31.4 g/dL (ref 30.0–36.0)
MCV: 108.4 fL — ABNORMAL HIGH (ref 80.0–100.0)
Platelets: 153 10*3/uL (ref 150–400)
RBC: 3.82 MIL/uL — ABNORMAL LOW (ref 3.87–5.11)
RDW: 14 % (ref 11.5–15.5)
WBC: 7.3 10*3/uL (ref 4.0–10.5)
nRBC: 0 % (ref 0.0–0.2)

## 2023-02-27 MED ORDER — FLUTICASONE PROPIONATE 50 MCG/ACT NA SUSP
2.0000 | Freq: Every day | NASAL | Status: DC
  Administered 2023-02-27 – 2023-03-01 (×3): 2 via NASAL
  Filled 2023-02-27: qty 16

## 2023-02-27 MED ORDER — OXYCODONE HCL 5 MG PO TABS
5.0000 mg | ORAL_TABLET | ORAL | Status: DC | PRN
  Administered 2023-02-27 – 2023-03-01 (×7): 5 mg via ORAL
  Filled 2023-02-27 (×7): qty 1

## 2023-02-27 MED ORDER — IPRATROPIUM-ALBUTEROL 0.5-2.5 (3) MG/3ML IN SOLN
3.0000 mL | Freq: Three times a day (TID) | RESPIRATORY_TRACT | Status: DC
  Administered 2023-02-27 – 2023-03-01 (×8): 3 mL via RESPIRATORY_TRACT
  Filled 2023-02-27 (×8): qty 3

## 2023-02-27 MED ORDER — NICOTINE 7 MG/24HR TD PT24
7.0000 mg | MEDICATED_PATCH | Freq: Every day | TRANSDERMAL | Status: DC
  Administered 2023-02-27 – 2023-03-01 (×3): 7 mg via TRANSDERMAL
  Filled 2023-02-27 (×3): qty 1

## 2023-02-27 NOTE — Progress Notes (Signed)
       CROSS COVER NOTE  NAME: Sherri Hart MRN: [id number] DOB : December 31, 1945    Concern as stated by nurse / staff    Nurse reports patient request nicotine patch. Reports 1./2 ppd smoker    Pertinent findings on chart review: H & P reviewed  Assessment and  Interventions   Assessment:  Plan: Nicotine patch ordered Smoking cessation addressed by attending per note review       Donnie Mesa NP [facility] Cross Cover 7pm-7am - check amion for availability Pager [phone]

## 2023-02-27 NOTE — Progress Notes (Signed)
PROGRESS NOTE  Sherri Hart Block EAV:[id number] DOB: 1946-02-01 DOA: 02/26/2023 PCP: Ellyn Hack, MD  HPI/Recap of past 24 hours: Sherri Hart is a 77 y.o. female with medical history significant for tobacco abuse, COPD on as needed oxygen, paroxysmal atrial fibrillation on Cardizem and Xarelto presents to the ER with several days of increasingly severe lower abdominal pain with polyuria, as well as intermittent wheezing and shortness of breath due to congestion. In the ED, tachycardic on arrival, saturating 80% on room air, requiring 2 to 4 L nasal cannula oxygen to maintain O2 saturations above 90%.  Lab work relatively unrevealing, only abnormal urinalysis, and CT of the abdomen and pelvis notes possible right lower lobe consolidation. Patient admitted to the hospital with COPD exacerbation, possible community-acquired pneumonia, and UTI.       Today, patient still having productive cough of whitish sputum, suprapubic/lower abdominal pain, urinary frequency/urgency, and nasal congestion   Assessment/Plan: Principal Problem:   CAP (community acquired pneumonia)   Acute hypoxic respiratory failure Community-acquired pneumonia COPD exacerbation On admission required about 4 L of O2, now down to 2 L, saturating well Currently afebrile, with no leukocytosis CT abdomen/pelvis showed possible right lower lobe consolidation Continue IV azithromycin, Rocephin Continued DuoNebs, as needed albuterol, p.o. prednisone Continue cough suppressant, Claritin, start Flonase for congestion Supplemental O2 as needed, wean as tolerated  Possible UTI UA with positive nitrites, many bacteria UC pending Continue Rocephin as above  Paroxysmal A-fib Currently rate controlled Continue Cardizem, Xarelto  Hyperlipidemia Continue pravastatin  Tobacco abuse Currently smokes about 2-3 sticks of cigarettes per day Advised to quit Nicotine patch  Obesity Lifestyle modification advised     Estimated body mass index is 35.87 kg/m as calculated from the following:   Height as of this encounter: 5\' 7"  (1.702 m).   Weight as of this encounter: 103.9 kg.     Code Status: Full  Family Communication: None at bedside  Disposition Plan: Status is: Observation The patient will require care spanning > 2 midnights and should be moved to inpatient because: Level of care      Consultants: None  Procedures: None  Antimicrobials: Ceftriaxone Azithromycin  DVT prophylaxis: Xarelto   Objective: Vitals:   02/26/23 1938 02/26/23 2033 02/26/23 2334 02/27/23 0428  BP: 123/81  (!) 144/65 (!) 147/89  Pulse: (!) 53   90  Resp: 18  18 18   Temp: 97.8 F (36.6 C)  98.3 F (36.8 C) (!) 97.5 F (36.4 C)  TempSrc: Oral  Oral Oral  SpO2: (!) 87% 91% 92% 93%  Weight:      Height:        Intake/Output Summary (Last 24 hours) at 02/27/2023 1051 Last data filed at 02/27/2023 0810 Gross per 24 hour  Intake 1088.06 ml  Output 150 ml  Net 938.06 ml   Filed Weights   02/26/23 1000 02/26/23 1003 02/26/23 1005  Weight: 104.3 kg 104 kg 103.9 kg    Exam: General: NAD  Cardiovascular: S1, S2 present Respiratory: Diminished breath sounds bilaterally Abdomen: Soft, nontender, nondistended, bowel sounds present Musculoskeletal: No bilateral pedal edema noted Skin: Normal Psychiatry: Normal mood     Data Reviewed: CBC: Recent Labs  Lab 02/26/23 1031 02/27/23 0521  WBC 8.0 7.3  NEUTROABS 5.0  --   HGB 14.7 13.0  HCT 45.2 41.4  MCV 105.6* 108.4*  PLT 159 153   Basic Metabolic Panel: Recent Labs  Lab 02/26/23 1031 02/27/23 0521  NA 139 138  K  3.8 4.3  CL 106 106  CO2 24 23  GLUCOSE 118* 148*  BUN 17 19  CREATININE 1.02* 0.99  CALCIUM 8.9 8.1*   GFR: Estimated Creatinine Clearance: 61.8 mL/min (by C-G formula based on SCr of 0.99 mg/dL). Liver Function Tests: Recent Labs  Lab 02/26/23 1031  AST 17  ALT 10  ALKPHOS 41  BILITOT 0.8  PROT 7.0   ALBUMIN 4.1   Recent Labs  Lab 02/26/23 1031  LIPASE 32   No results for input(s): "AMMONIA" in the last 168 hours. Coagulation Profile: No results for input(s): "INR", "PROTIME" in the last 168 hours. Cardiac Enzymes: No results for input(s): "CKTOTAL", "CKMB", "CKMBINDEX", "TROPONINI" in the last 168 hours. BNP (last 3 results) No results for input(s): "PROBNP" in the last 8760 hours. HbA1C: No results for input(s): "HGBA1C" in the last 72 hours. CBG: No results for input(s): "GLUCAP" in the last 168 hours. Lipid Profile: No results for input(s): "CHOL", "HDL", "LDLCALC", "TRIG", "CHOLHDL", "LDLDIRECT" in the last 72 hours. Thyroid Function Tests: No results for input(s): "TSH", "T4TOTAL", "FREET4", "T3FREE", "THYROIDAB" in the last 72 hours. Anemia Panel: No results for input(s): "VITAMINB12", "FOLATE", "FERRITIN", "TIBC", "IRON", "RETICCTPCT" in the last 72 hours. Urine analysis:    Component Value Date/Time   COLORURINE YELLOW 02/26/2023 1031   APPEARANCEUR CLEAR 02/26/2023 1031   LABSPEC 1.024 02/26/2023 1031   PHURINE 5.0 02/26/2023 1031   GLUCOSEU NEGATIVE 02/26/2023 1031   HGBUR NEGATIVE 02/26/2023 1031   BILIRUBINUR NEGATIVE 02/26/2023 1031   KETONESUR NEGATIVE 02/26/2023 1031   PROTEINUR 30 (A) 02/26/2023 1031   NITRITE POSITIVE (A) 02/26/2023 1031   LEUKOCYTESUR NEGATIVE 02/26/2023 1031   Sepsis Labs: @LABRCNTIP (procalcitonin:4,lacticidven:4)  )No results found for this or any previous visit (from the past 240 hour(s)).    Studies: CT ABDOMEN PELVIS WO CONTRAST  Result Date: 02/26/2023 CLINICAL DATA:  Acute lower abdominal pain. EXAM: CT ABDOMEN AND PELVIS WITHOUT CONTRAST TECHNIQUE: Multidetector CT imaging of the abdomen and pelvis was performed following the standard protocol without IV contrast. RADIATION DOSE REDUCTION: This exam was performed according to the departmental dose-optimization program which includes automated exposure control,  adjustment of the mA and/or kV according to patient size and/or use of iterative reconstruction technique. COMPARISON:  None Available. FINDINGS: Lower chest: Mild right lower lobe atelectasis or pneumonia is noted. Hepatobiliary: No focal liver abnormality is seen. Status post cholecystectomy. No biliary dilatation. Pancreas: Unremarkable. No pancreatic ductal dilatation or surrounding inflammatory changes. Spleen: Normal in size without focal abnormality. 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 Left renal cyst is noted for which no further follow-up is required. No hydronephrosis or renal obstruction is noted. Urinary bladder is unremarkable. Stomach/Bowel: The stomach is unremarkable. There is no evidence of bowel obstruction or inflammation. Vascular/Lymphatic: Aortic atherosclerosis. No enlarged abdominal or pelvic lymph nodes. Reproductive: Uterus and bilateral adnexa are unremarkable. Other: Small fat containing periumbilical hernia. No ascites is noted. Musculoskeletal: No acute or significant osseous findings. IMPRESSION: Mild right lower lobe pneumonia or atelectasis is noted. Small fat containing periumbilical hernia. No acute abnormality seen in the abdomen or pelvis. Aortic Atherosclerosis (ICD10-I70.0). Electronically Signed   By: Lupita Raider M.D.   On: 02/26/2023 13:28   DG Chest 2 View  Result Date: 02/26/2023 CLINICAL DATA:  Shortness of breath.  Abdominal pain EXAM: CHEST - 2 VIEW COMPARISON:  X-ray 06/03/2022 FINDINGS: Enlarged cardiopericardial silhouette. Calcified aorta. Slight vascular congestion. No pneumothorax or effusion. Overlapping cardiac leads. No edema or consolidation. Film  is under penetrated and inflated x-ray. Fixation hardware along the lower cervical spine at the edge of the imaging field. IMPRESSION: Underinflation compared to previous. Enlarged cardiopericardial silhouette with a calcified aorta. Slight vascular congestion. Electronically Signed    By: Karen Kays M.D.   On: 02/26/2023 11:52    Scheduled Meds:  diltiazem  180 mg Oral Daily   guaiFENesin  600 mg Oral BID   ipratropium-albuterol  3 mL Nebulization TID   loratadine  10 mg Oral Daily   nicotine  14 mg Transdermal Once   pravastatin  20 mg Oral QHS   predniSONE  40 mg Oral Q breakfast   rivaroxaban  20 mg Oral Q supper    Continuous Infusions:  azithromycin 500 mg (02/27/23 1027)   cefTRIAXone (ROCEPHIN)  IV 1 g (02/27/23 0853)   methocarbamol (ROBAXIN) IV       LOS: 0 days     Briant Cedar, MD [facility]  If 7PM-7AM, please contact night-coverage [email or web address] 02/27/2023, 10:51 AM

## 2023-02-27 NOTE — TOC CM/SW Note (Signed)
Transition of Care [facility]) - Inpatient Brief Assessment   Patient Details  Name: Sherri Hart MRN: [id number] Date of Birth: 1946-03-30  [facility]) CM/SW Contact:    Otelia Santee, LCSW Phone Number: 02/27/2023, 11:46 AM   Clinical Narrative: Pt currently has BSC, cane, RW, and home O2. Pt able to increase O2 liter flow as needed. No TOC needs identified. Please consult TOC should need arise.    Transition of Care Asessment: Insurance and Status: Insurance coverage has been reviewed Patient has primary care physician: Yes Home environment has been reviewed: [employer] w/ supportive roommates Prior level of function:: Modified indepedent Prior/Current Home Services: No current home services Social Determinants of Health Reivew: SDOH reviewed no interventions necessary Readmission risk has been reviewed: Yes Transition of care needs: no transition of care needs at this time

## 2023-02-28 DIAGNOSIS — J189 Pneumonia, unspecified organism: Secondary | ICD-10-CM | POA: Diagnosis not present

## 2023-02-28 LAB — CBC WITH DIFFERENTIAL/PLATELET
Abs Immature Granulocytes: 0.08 10*3/uL — ABNORMAL HIGH (ref 0.00–0.07)
Basophils Absolute: 0 10*3/uL (ref 0.0–0.1)
Basophils Relative: 0 %
Eosinophils Absolute: 0 10*3/uL (ref 0.0–0.5)
Eosinophils Relative: 0 %
HCT: 39.5 % (ref 36.0–46.0)
Hemoglobin: 12 g/dL (ref 12.0–15.0)
Immature Granulocytes: 1 %
Lymphocytes Relative: 11 %
Lymphs Abs: 1.2 10*3/uL (ref 0.7–4.0)
MCH: 33.8 pg (ref 26.0–34.0)
MCHC: 30.4 g/dL (ref 30.0–36.0)
MCV: 111.3 fL — ABNORMAL HIGH (ref 80.0–100.0)
Monocytes Absolute: 0.7 10*3/uL (ref 0.1–1.0)
Monocytes Relative: 6 %
Neutro Abs: 9.4 10*3/uL — ABNORMAL HIGH (ref 1.7–7.7)
Neutrophils Relative %: 82 %
Platelets: 145 10*3/uL — ABNORMAL LOW (ref 150–400)
RBC: 3.55 MIL/uL — ABNORMAL LOW (ref 3.87–5.11)
RDW: 14 % (ref 11.5–15.5)
WBC: 11.4 10*3/uL — ABNORMAL HIGH (ref 4.0–10.5)
nRBC: 0 % (ref 0.0–0.2)

## 2023-02-28 LAB — URINE CULTURE

## 2023-02-28 LAB — BASIC METABOLIC PANEL
Anion gap: 4 — ABNORMAL LOW (ref 5–15)
BUN: 27 mg/dL — ABNORMAL HIGH (ref 8–23)
CO2: 28 mmol/L (ref 22–32)
Calcium: 8.6 mg/dL — ABNORMAL LOW (ref 8.9–10.3)
Chloride: 107 mmol/L (ref 98–111)
Creatinine, Ser: 0.9 mg/dL (ref 0.44–1.00)
GFR, Estimated: >60 mL/min (ref >60)
Glucose, Bld: 130 mg/dL — ABNORMAL HIGH (ref 70–99)
Potassium: 4.7 mmol/L (ref 3.5–5.1)
Sodium: 139 mmol/L (ref 135–145)

## 2023-02-28 MED ORDER — FUROSEMIDE 10 MG/ML IJ SOLN
40.0000 mg | Freq: Once | INTRAMUSCULAR | Status: AC
  Administered 2023-02-28: 40 mg via INTRAVENOUS
  Filled 2023-02-28: qty 4

## 2023-02-28 NOTE — Plan of Care (Signed)

## 2023-02-28 NOTE — Progress Notes (Signed)
Mobility Specialist - Progress Note   02/28/23 0919  Oxygen Therapy  SpO2 90 %  O2 Device Nasal Cannula  O2 Flow Rate (L/min) 3 L/min  Mobility  Activity Ambulated with assistance in hallway  Level of Assistance Standby assist, set-up cues, supervision of patient - no hands on  Assistive Device Front wheel walker  Distance Ambulated (ft) 210 ft  Range of Motion/Exercises Active  Activity Response Tolerated well  Mobility Referral Yes  $Mobility charge 1 Mobility  Mobility Specialist Start Time (ACUTE ONLY) 0900  Mobility Specialist Stop Time (ACUTE ONLY) [id number]  Mobility Specialist Time Calculation (min) (ACUTE ONLY) 17 min   Pt received in bed and agreed to mobility.   Nurse requested Mobility Specialist to perform oxygen saturation test with pt which includes removing pt from oxygen both at rest and while ambulating.  Below are the results from that testing.     Patient Saturations on Room Air at Rest = spO2 90%  Patient Saturations on Room Air while Ambulating = sp02 85% .  Rested and performed pursed lip breathing for 1 minute with sp02 at 86%.  Patient Saturations on 3 Liters of oxygen while Ambulating = sp02 90%  At end of testing pt left in room on 2  Liters of oxygen.  Reported results to nurse.   Pt initially bumped to to 1 L, stayed around 86% SpO2, bumped to 2L and recovered to 88%.   Pt returned to bed with all needs met.  Marilynne Halsted Mobility Specialist

## 2023-02-28 NOTE — Progress Notes (Signed)
PROGRESS NOTE  Sherri Hart Block WJX:[id number] DOB: Jul 28, 1946 DOA: 02/26/2023 PCP: Ellyn Hack, MD  HPI/Recap of past 24 hours: Sherri Hart is a 77 y.o. female with medical history significant for tobacco abuse, COPD on as needed oxygen, paroxysmal atrial fibrillation on Cardizem and Xarelto presents to the ER with several days of increasingly severe lower abdominal pain with polyuria, as well as intermittent wheezing and shortness of breath due to congestion. In the ED, tachycardic on arrival, saturating 80% on room air, requiring 2 to 4 L nasal cannula oxygen to maintain O2 saturations above 90%.  Lab work relatively unrevealing, only abnormal urinalysis, and CT of the abdomen and pelvis notes possible right lower lobe consolidation. Patient admitted to the hospital with COPD exacerbation, possible community-acquired pneumonia, and UTI.       Today, patient feeling better, noted to desaturate to about 84% on 3 L of O2.  Still reporting some shortness of breath, denies any chest pain.   Assessment/Plan: Principal Problem:   CAP (community acquired pneumonia)   Acute hypoxic respiratory failure Community-acquired pneumonia COPD exacerbation On admission required about 4 L of O2, now down to 3L at rest, but desaturated to 84% on 3 L Currently afebrile CT abdomen/pelvis showed possible right lower lobe consolidation Continue IV azithromycin, Rocephin Continued DuoNebs, as needed albuterol, p.o. prednisone Continue cough suppressant, Claritin, start Flonase for congestion Supplemental O2 as needed, wean as tolerated  Possible UTI UA with positive nitrites, many bacteria UC grew multiple specialties Continue Rocephin as above  Paroxysmal A-fib Currently rate controlled Continue Cardizem, Xarelto  Hyperlipidemia Continue pravastatin  Tobacco abuse Advised to quit Nicotine patch  Obesity Lifestyle modification advised    Estimated body mass index is 35.87  kg/m as calculated from the following:   Height as of this encounter: 5\' 7"  (1.702 m).   Weight as of this encounter: 103.9 kg.     Code Status: Full  Family Communication: None at bedside  Disposition Plan: Status is: Observation The patient will require care spanning > 2 midnights and should be moved to inpatient because: Level of care      Consultants: None  Procedures: None  Antimicrobials: Ceftriaxone Azithromycin  DVT prophylaxis: Xarelto   Objective: Vitals:   02/28/23 0847 02/28/23 0919 02/28/23 1243 02/28/23 1333  BP:   121/87   Pulse:   90   Resp:   14   Temp:   98.5 F (36.9 C)   TempSrc:   Oral   SpO2: 92% 90% (!) 85% 92%  Weight:      Height:        Intake/Output Summary (Last 24 hours) at 02/28/2023 1740 Last data filed at 02/28/2023 0100 Gross per 24 hour  Intake 0 ml  Output --  Net 0 ml   Filed Weights   02/26/23 1000 02/26/23 1003 02/26/23 1005  Weight: 104.3 kg 104 kg 103.9 kg    Exam: General: NAD  Cardiovascular: S1, S2 present Respiratory: Diminished breath sounds bilaterally Abdomen: Soft, nontender, nondistended, bowel sounds present Musculoskeletal: 1+bilateral pedal edema noted Skin: Normal Psychiatry: Normal mood     Data Reviewed: CBC: Recent Labs  Lab 02/26/23 1031 02/27/23 0521 02/28/23 0543  WBC 8.0 7.3 11.4*  NEUTROABS 5.0  --  9.4*  HGB 14.7 13.0 12.0  HCT 45.2 41.4 39.5  MCV 105.6* 108.4* 111.3*  PLT 159 153 145*   Basic Metabolic Panel: Recent Labs  Lab 02/26/23 1031 02/27/23 0521 02/28/23 0543  NA 139  138 139  K 3.8 4.3 4.7  CL 106 106 107  CO2 24 23 28   GLUCOSE 118* 148* 130*  BUN 17 19 27*  CREATININE 1.02* 0.99 0.90  CALCIUM 8.9 8.1* 8.6*   GFR: Estimated Creatinine Clearance: 68 mL/min (by C-G formula based on SCr of 0.9 mg/dL). Liver Function Tests: Recent Labs  Lab 02/26/23 1031  AST 17  ALT 10  ALKPHOS 41  BILITOT 0.8  PROT 7.0  ALBUMIN 4.1   Recent Labs  Lab  02/26/23 1031  LIPASE 32   No results for input(s): "AMMONIA" in the last 168 hours. Coagulation Profile: No results for input(s): "INR", "PROTIME" in the last 168 hours. Cardiac Enzymes: No results for input(s): "CKTOTAL", "CKMB", "CKMBINDEX", "TROPONINI" in the last 168 hours. BNP (last 3 results) No results for input(s): "PROBNP" in the last 8760 hours. HbA1C: No results for input(s): "HGBA1C" in the last 72 hours. CBG: No results for input(s): "GLUCAP" in the last 168 hours. Lipid Profile: No results for input(s): "CHOL", "HDL", "LDLCALC", "TRIG", "CHOLHDL", "LDLDIRECT" in the last 72 hours. Thyroid Function Tests: No results for input(s): "TSH", "T4TOTAL", "FREET4", "T3FREE", "THYROIDAB" in the last 72 hours. Anemia Panel: No results for input(s): "VITAMINB12", "FOLATE", "FERRITIN", "TIBC", "IRON", "RETICCTPCT" in the last 72 hours. Urine analysis:    Component Value Date/Time   COLORURINE YELLOW 02/26/2023 1031   APPEARANCEUR CLEAR 02/26/2023 1031   LABSPEC 1.024 02/26/2023 1031   PHURINE 5.0 02/26/2023 1031   GLUCOSEU NEGATIVE 02/26/2023 1031   HGBUR NEGATIVE 02/26/2023 1031   BILIRUBINUR NEGATIVE 02/26/2023 1031   KETONESUR NEGATIVE 02/26/2023 1031   PROTEINUR 30 (A) 02/26/2023 1031   NITRITE POSITIVE (A) 02/26/2023 1031   LEUKOCYTESUR NEGATIVE 02/26/2023 1031   Sepsis Labs: @LABRCNTIP (procalcitonin:4,lacticidven:4)  ) Recent Results (from the past 240 hour(s))  Urine Culture (for pregnant, neutropenic or urologic patients or patients with an indwelling urinary catheter)     Status: Abnormal   Collection Time: 02/26/23 10:31 AM   Specimen: Urine, Clean Catch  Result Value Ref Range Status   Specimen Description   Final    URINE, CLEAN CATCH Performed at [facility], [address], [place], [place] [address]    Special Requests   Final    NONE Performed at [facility], [address] [place], [place] [address]     Culture MULTIPLE SPECIES PRESENT, SUGGEST RECOLLECTION (A)  Final   Report Status 02/28/2023 FINAL  Final      Studies: No results found.  Scheduled Meds:  diltiazem  180 mg Oral Daily   fluticasone  2 spray Each Nare Daily   guaiFENesin  600 mg Oral BID   ipratropium-albuterol  3 mL Nebulization TID   loratadine  10 mg Oral Daily   nicotine  7 mg Transdermal Daily   pravastatin  20 mg Oral QHS   predniSONE  40 mg Oral Q breakfast   rivaroxaban  20 mg Oral Q supper    Continuous Infusions:  azithromycin Stopped (02/28/23 1407)   cefTRIAXone (ROCEPHIN)  IV Stopped (02/28/23 1407)   methocarbamol (ROBAXIN) IV       LOS: 0 days     Briant Cedar, MD [facility]  If 7PM-7AM, please contact night-coverage [email or web address] 02/28/2023, 5:40 PM

## 2023-02-28 NOTE — Evaluation (Signed)
Physical Therapy Evaluation Patient Details Name: Sherri Hart MRN: [id number] DOB: 1945-11-04 Today's Date: 02/28/2023  History of Present Illness  77 y.o. female admitted 02/26/23 with Acute hypoxic respiratory failure, Community-acquired pneumonia, COPD exacerbation.  PMHx: tobacco abuse, COPD on as needed oxygen, paroxysmal atrial fibrillation on Cardizem and Xarelto, back surgery  Clinical Impression  Patient evaluated by Physical Therapy with no further acute PT needs identified. All education has been completed and the patient has no further questions.  Pt mobilizing well and very eager to d/c home.  Pt requiring at least 3L O2 [place] for ambulation (see below).  Pt has pulse oximeter at home as well.  Pt plans to return home and has granddaughter 10 minutes away if she needs any assist (granddaughter already assists with any tasks outside of home, like shopping).  PT is signing off. Thank you for this referral.         Recommendations for follow up therapy are one component of a multi-disciplinary discharge planning process, led by the attending physician.  Recommendations may be updated based on patient status, additional functional criteria and insurance authorization.  Follow Up Recommendations       Assistance Recommended at Discharge PRN  Patient can return home with the following  Assist for transportation;Assistance with cooking/housework;Help with stairs or ramp for entrance    Equipment Recommendations None recommended by PT  Recommendations for Other Services       Functional Status Assessment Patient has not had a recent decline in their functional status     Precautions / Restrictions Precautions Precautions: Other (comment) Precaution Comments: monitor SpO2      Mobility  Bed Mobility Overal bed mobility: Modified Independent                  Transfers Overall transfer level: Modified independent                 General transfer comment:  transferring to [facility] in room prior to arrival    Ambulation/Gait Ambulation/Gait assistance: Supervision, Min guard Gait Distance (Feet): 260 Feet Assistive device: Rolling walker (2 wheels) Gait Pattern/deviations: Decreased stride length, Step-through pattern, Trunk flexed       General Gait Details: 94% on 3L O2 prior to ambulating, SpO2 dropped to 84% on 3L O2, pt aware she will likely need increased LPM upon d/c and does have pulse oximeter at home to monitor Spo2; typically uses rollator, no unsteadiness observed  Stairs            Wheelchair Mobility    Modified Rankin (Stroke Patients Only)       Balance                                             Pertinent Vitals/Pain Pain Assessment Pain Assessment: No/denies pain    Home Living Family/patient expects to be discharged to:: Private residence Living Arrangements: Other (Comment) ("Boarding House") Available Help at Discharge: Family;Available PRN/intermittently   Home Access: Ramped entrance       Home Layout: One level Home Equipment: Agricultural consultant (2 wheels);Rollator (4 wheels)      Prior Function Prior Level of Function : Needs assist             Mobility Comments: uses rollator for mobility, O2 at night and PRN but typically 2LO2 ADLs Comments: granddaughter assists with shopping, driving (anything out  of home)     Hand Dominance        Extremity/Trunk Assessment   Upper Extremity Assessment Upper Extremity Assessment: Overall WFL for tasks assessed    Lower Extremity Assessment Lower Extremity Assessment: Overall WFL for tasks assessed       Communication   Communication: No difficulties  Cognition Arousal/Alertness: Awake/alert Behavior During Therapy: WFL for tasks assessed/performed Overall Cognitive Status: Within Functional Limits for tasks assessed                                          General Comments      Exercises      Assessment/Plan    PT Assessment Patient does not need any further PT services  PT Problem List         PT Treatment Interventions      PT Goals (Current goals can be found in the Care Plan section)  Acute Rehab PT Goals PT Goal Formulation: All assessment and education complete, DC therapy    Frequency       Co-evaluation               AM-PAC PT "6 Clicks" Mobility  Outcome Measure Help needed turning from your back to your side while in a flat bed without using bedrails?: None Help needed moving from lying on your back to sitting on the side of a flat bed without using bedrails?: None Help needed moving to and from a bed to a chair (including a wheelchair)?: None Help needed standing up from a chair using your arms (e.g., wheelchair or bedside chair)?: None Help needed to walk in hospital room?: A Little Help needed climbing 3-5 steps with a railing? : A Little 6 Click Score: 22    End of Session Equipment Utilized During Treatment: Oxygen Activity Tolerance: Patient tolerated treatment well Patient left: in bed;with call bell/phone within reach;with bed alarm set Nurse Communication: Mobility status PT Visit Diagnosis: Difficulty in walking, not elsewhere classified (R26.2)    Time: 1610-9604 PT Time Calculation (min) (ACUTE ONLY): 25 min   Charges:   PT Evaluation $PT Eval Low Complexity: 1 Low         Kati PT, DPT Physical Therapist Acute Rehabilitation Services Office: [phone]   Janan Halter [place] 02/28/2023, 4:46 PM

## 2023-03-01 ENCOUNTER — Observation Stay (HOSPITAL_BASED_OUTPATIENT_CLINIC_OR_DEPARTMENT_OTHER): Payer: Medicare HMO

## 2023-03-01 DIAGNOSIS — J189 Pneumonia, unspecified organism: Secondary | ICD-10-CM | POA: Diagnosis not present

## 2023-03-01 DIAGNOSIS — R0609 Other forms of dyspnea: Secondary | ICD-10-CM

## 2023-03-01 LAB — BASIC METABOLIC PANEL
Anion gap: 10 (ref 5–15)
BUN: 31 mg/dL — ABNORMAL HIGH (ref 8–23)
CO2: 27 mmol/L (ref 22–32)
Calcium: 8.9 mg/dL (ref 8.9–10.3)
Chloride: 103 mmol/L (ref 98–111)
Creatinine, Ser: 1.08 mg/dL — ABNORMAL HIGH (ref 0.44–1.00)
GFR, Estimated: 54 mL/min — ABNORMAL LOW (ref >60)
Glucose, Bld: 100 mg/dL — ABNORMAL HIGH (ref 70–99)
Potassium: 3.9 mmol/L (ref 3.5–5.1)
Sodium: 140 mmol/L (ref 135–145)

## 2023-03-01 LAB — ECHOCARDIOGRAM COMPLETE
AR max vel: 3.02 cm2
AV Area VTI: 2.9 cm2
AV Area mean vel: 2.92 cm2
AV Mean grad: 3 mmHg
AV Peak grad: 6.1 mmHg
Ao pk vel: 1.23 m/s
Area-P 1/2: 7.56 cm2
Calc EF: 56.6 %
Height: 67 in
MV VTI: 3.28 cm2
S' Lateral: 3.7 cm
Single Plane A2C EF: 57.4 %
Single Plane A4C EF: 55.1 %
Weight: 3664 oz

## 2023-03-01 LAB — CBC WITH DIFFERENTIAL/PLATELET
Abs Immature Granulocytes: 0.11 10*3/uL — ABNORMAL HIGH (ref 0.00–0.07)
Basophils Absolute: 0 10*3/uL (ref 0.0–0.1)
Basophils Relative: 0 %
Eosinophils Absolute: 0 10*3/uL (ref 0.0–0.5)
Eosinophils Relative: 0 %
HCT: 42.8 % (ref 36.0–46.0)
Hemoglobin: 13.3 g/dL (ref 12.0–15.0)
Immature Granulocytes: 1 %
Lymphocytes Relative: 16 %
Lymphs Abs: 2 10*3/uL (ref 0.7–4.0)
MCH: 34.4 pg — ABNORMAL HIGH (ref 26.0–34.0)
MCHC: 31.1 g/dL (ref 30.0–36.0)
MCV: 110.6 fL — ABNORMAL HIGH (ref 80.0–100.0)
Monocytes Absolute: 0.9 10*3/uL (ref 0.1–1.0)
Monocytes Relative: 8 %
Neutro Abs: 9.4 10*3/uL — ABNORMAL HIGH (ref 1.7–7.7)
Neutrophils Relative %: 75 %
Platelets: 154 10*3/uL (ref 150–400)
RBC: 3.87 MIL/uL (ref 3.87–5.11)
RDW: 14 % (ref 11.5–15.5)
WBC: 12.4 10*3/uL — ABNORMAL HIGH (ref 4.0–10.5)
nRBC: 0 % (ref 0.0–0.2)

## 2023-03-01 LAB — BRAIN NATRIURETIC PEPTIDE: B Natriuretic Peptide: 400.5 pg/mL — ABNORMAL HIGH (ref 0.0–100.0)

## 2023-03-01 MED ORDER — FLUTICASONE PROPIONATE 50 MCG/ACT NA SUSP: 2.0000 | Freq: Every day | NASAL | 0 refills | Status: DC

## 2023-03-01 MED ORDER — IPRATROPIUM-ALBUTEROL 0.5-2.5 (3) MG/3ML IN SOLN: 3.0000 mL | Freq: Four times a day (QID) | RESPIRATORY_TRACT | 0 refills | Status: AC | PRN

## 2023-03-01 MED ORDER — HYDRALAZINE HCL 20 MG/ML IJ SOLN
10.0000 mg | Freq: Three times a day (TID) | INTRAMUSCULAR | Status: DC | PRN
  Administered 2023-03-01: 10 mg via INTRAVENOUS
  Filled 2023-03-01: qty 1

## 2023-03-01 MED ORDER — PREDNISONE 10 MG PO TABS: ORAL_TABLET | ORAL | 0 refills | Status: AC

## 2023-03-01 MED ORDER — AZITHROMYCIN 500 MG PO TABS: 500.0000 mg | ORAL_TABLET | Freq: Every day | ORAL | 0 refills | Status: AC

## 2023-03-01 MED ORDER — CEFDINIR 300 MG PO CAPS: 300.0000 mg | ORAL_CAPSULE | Freq: Two times a day (BID) | ORAL | 0 refills | Status: AC

## 2023-03-01 NOTE — Discharge Summary (Signed)
Physician Discharge Summary   Patient: Sherri Hart MRN: [id number] DOB: [birth year]  Admit date:     02/26/2023  Discharge date: 03/01/23  Discharge Physician: Briant Cedar   PCP: Ellyn Hack, MD   Recommendations at discharge:   Follow-up with PCP in 1 week  Discharge Diagnoses: Principal Problem:   CAP (community acquired pneumonia)    Hospital Course: Sherri Hart is a 77 y.o. female with medical history significant for tobacco abuse, COPD on as needed oxygen, paroxysmal atrial fibrillation on Cardizem and Xarelto presents to the ER with several days of increasingly severe lower abdominal pain with polyuria, as well as intermittent wheezing and shortness of breath due to congestion. In the ED, tachycardic on arrival, saturating 80% on room air, requiring 2 to 4 L nasal cannula oxygen to maintain O2 saturations above 90%.  Lab work relatively unrevealing, only abnormal urinalysis, and CT of the abdomen and pelvis notes possible right lower lobe consolidation. Patient admitted to the hospital with COPD exacerbation, possible community-acquired pneumonia, and UTI.     Today, patient still requiring O2, about 3 L saturating well above 90%.  Patient already has oxygen set up at home and was encouraged to use it at rest as well as on ambulation, as patient is extremely very eager to be discharged from the hospital since yesterday.  Advised patient to follow-up with her primary care for further management after being discharged.   Assessment and Plan: No notes have been filed under this hospital service. Service: Hospitalist  Acute hypoxic respiratory failure Community-acquired pneumonia COPD exacerbation On admission required about 4 L of O2, now down to 3L at rest, but desaturated to 84% on 3 L Currently afebrile CT abdomen/pelvis showed possible right lower lobe consolidation S/P IV azithromycin, Rocephin, discharged on p.o. azithromycin and cefdinir to  complete 5 days Arranged for a nebulizer machine for patient of which was delivered to patient's room, ordered DuoNebs, continue albuterol as needed, Symbicort.  Unable to afford Anoro or Dulera Tapered p.o. prednisone completing a week Cough suppressant, Claritin, Flonase for congestion Supplemental O2   Possible UTI UA with positive nitrites, many bacteria UC grew multiple specialties Antibiotics as above   Paroxysmal A-fib Currently rate controlled Continue Cardizem, Xarelto   Hyperlipidemia Continue pravastatin   Tobacco abuse Advised to quit Nicotine patch   Obesity Lifestyle modification advised        Consultants: None Procedures performed: None Disposition: Home Diet recommendation:  Cardiac diet   DISCHARGE MEDICATION: Allergies as of 03/01/2023       Reactions   Aspirin Other (See Comments)   Stomach ache    Penicillins Swelling, Other (See Comments)   Swollen lips and welts + Pt stated that she can take keflex++ Did it involve swelling of the face/tongue/throat, SOB, or low BP? Yes Did it involve sudden or severe rash/hives, skin peeling, or any reaction on the inside of your mouth or nose? N Did you need to seek medical attention at a hospital or doctor's office? Y When did it last happen? childhood      If all above answers are "NO", may proceed with cephalosporin use.        Medication List     STOP taking these medications    Aleve 220 MG tablet Generic drug: naproxen sodium   diltiazem 180 MG 24 hr capsule Commonly known as: CARDIZEM CD   oxyCODONE 5 MG immediate release tablet Commonly known as: Oxy IR/ROXICODONE   Sinus &  Congestion Day/Night Misc       TAKE these medications    acetaminophen 500 MG tablet Commonly known as: TYLENOL Take 500-1,000 mg by mouth every 6 (six) hours as needed for mild pain or headache.   albuterol 108 (90 Base) MCG/ACT inhaler Commonly known as: VENTOLIN HFA Inhale 2 puffs into the lungs  every 6 (six) hours as needed for wheezing or shortness of breath.   alendronate 70 MG tablet Commonly known as: FOSAMAX Take 70 mg by mouth once a week. Take with a full glass of water on an empty stomach.   azithromycin 500 MG tablet Commonly known as: Zithromax Take 1 tablet (500 mg total) by mouth daily for 1 day. Start taking on: March 02, 2023   Belsomra 10 MG Tabs Generic drug: Suvorexant Take 1.5 tablets by mouth at bedtime as needed (For sleep).   cefdinir 300 MG capsule Commonly known as: OMNICEF Take 1 capsule (300 mg total) by mouth 2 (two) times daily for 1 day. Start taking on: March 02, 2023   Dilt-XR 180 MG 24 hr capsule Generic drug: diltiazem Take 180 mg by mouth daily.   ergocalciferol 1.25 MG (50000 UT) capsule Commonly known as: VITAMIN D2 Take 50,000 Units by mouth once a week.   fluticasone 50 MCG/ACT nasal spray Commonly known as: FLONASE Place 2 sprays into both nostrils daily. Start taking on: March 02, 2023   furosemide 40 MG tablet Commonly known as: LASIX Take 1 tablet (40 mg total) by mouth daily.   guaiFENesin 600 MG 12 hr tablet Commonly known as: MUCINEX Take 1 tablet (600 mg total) by mouth 2 (two) times daily.   ipratropium-albuterol 0.5-2.5 (3) MG/3ML Soln Commonly known as: DUONEB Take 3 mLs by nebulization every 6 (six) hours as needed.   loratadine 10 MG tablet Commonly known as: CLARITIN Take 10 mg by mouth daily.   nicotine 14 mg/24hr patch Commonly known as: NICODERM CQ - dosed in mg/24 hours Place 1 patch (14 mg total) onto the skin daily.   pravastatin 20 MG tablet Commonly known as: PRAVACHOL Take 20 mg by mouth at bedtime.   predniSONE 10 MG tablet Commonly known as: DELTASONE Take 4 tablets (40 mg total) by mouth daily with breakfast for 2 days, THEN 3 tablets (30 mg total) daily with breakfast for 2 days, THEN 2 tablets (20 mg total) daily with breakfast for 2 days, THEN 1 tablet (10 mg total) daily with breakfast  for 2 days. Start taking on: March 01, 2023 What changed:  medication strength See the new instructions.   Symbicort 80-4.5 MCG/ACT inhaler Generic drug: budesonide-formoterol Inhale 2 puffs into the lungs in the morning and at bedtime.   umeclidinium-vilanterol 62.5-25 MCG/ACT Aepb Commonly known as: ANORO ELLIPTA Inhale 1 puff into the lungs daily.   Xarelto 20 MG Tabs tablet Generic drug: rivaroxaban TAKE 1 TABLET(20 MG) BY MOUTH DAILY WITH SUPPER What changed: See the new instructions.               Durable Medical Equipment  (From admission, onward)           Start     Ordered   03/01/23 0858  For home use only DME Nebulizer machine  Once       Question Answer Comment  Patient needs a nebulizer to treat with the following condition COPD exacerbation (HCC)   Length of Need Lifetime      03/01/23 0858  Follow-up Information     Ellyn Hack, MD. Schedule an appointment as soon as possible for a visit in 1 week(s).   Specialty: Family Medicine Contact information: [address] [place] [address] [phone]                Discharge Exam: Ceasar Mons Weights   02/26/23 1000 02/26/23 1003 02/26/23 1005  Weight: 104.3 kg 104 kg 103.9 kg   General: NAD  Cardiovascular: S1, S2 present Respiratory: Diminished breath sounds bilaterally Abdomen: Soft, nontender, nondistended, bowel sounds present Musculoskeletal: No bilateral pedal edema noted Skin: Normal Psychiatry: Normal mood   Condition at discharge: stable  The results of significant diagnostics from this hospitalization (including imaging, microbiology, ancillary and laboratory) are listed below for reference.   Imaging Studies: ECHOCARDIOGRAM COMPLETE  Result Date: 03/01/2023    ECHOCARDIOGRAM REPORT   Patient Name:   [facility] BLOCK Date of Exam: 03/01/2023 Medical Rec #:  [id number]          Height:       67.0 in Accession #:    [id number]         Weight:        229.0 lb Date of Birth:  1946-01-23           BSA:          2.142 m Patient Age:    76 years           BP:           154/91 mmHg Patient Gender: F                  HR:           107 bpm. Exam Location:  Inpatient Procedure: 2D Echo, Cardiac Doppler and Color Doppler Indications:    Dyspnea  History:        Patient has prior history of Echocardiogram examinations, most                 recent 09/07/2021. CHF, COPD, Arrythmias:Atrial Fibrillation,                 Signs/Symptoms:Shortness of Breath; Risk Factors:Current Smoker.  Sonographer:    Wallie Char Referring Phys: [id number] Monica Martinez [facility]  Sonographer Comments: Patient is obese. Image acquisition challenging due to patient body habitus and Image acquisition challenging due to COPD. IMPRESSIONS  1. Left ventricular ejection fraction, by estimation, is 55 to 60%. Left ventricular ejection fraction by 2D MOD biplane is 56.6 %. The left ventricle has normal function. The left ventricle has no regional wall motion abnormalities. There is mild left ventricular hypertrophy. Left ventricular diastolic function could not be evaluated.  2. Right ventricular systolic function is mildly reduced. The right ventricular size is normal.  3. Left atrial size was moderately dilated.  4. Right atrial size was severely dilated.  5. The mitral valve is grossly normal. Trivial mitral valve regurgitation.  6. The aortic valve is tricuspid. Aortic valve regurgitation is not visualized. Aortic valve sclerosis is present, with no evidence of aortic valve stenosis.  7. Aortic dilatation noted. There is borderline dilatation of the ascending aorta, measuring 38 mm. Comparison(s): Changes from prior study are noted. 09/07/2021: LVEF 60-65%, mildly reduced RV systolic function. FINDINGS  Left Ventricle: Left ventricular ejection fraction, by estimation, is 55 to 60%. Left ventricular ejection fraction by 2D MOD biplane is 56.6 %. The left ventricle has normal function. The left  ventricle has no regional wall motion  abnormalities. The left ventricular internal cavity size was normal in size. There is mild left ventricular hypertrophy. Left ventricular diastolic function could not be evaluated due to atrial fibrillation. Left ventricular diastolic function could not be evaluated. Right Ventricle: The right ventricular size is normal. No increase in right ventricular wall thickness. Right ventricular systolic function is mildly reduced. Left Atrium: Left atrial size was moderately dilated. Right Atrium: Right atrial size was severely dilated. Pericardium: There is no evidence of pericardial effusion. Mitral Valve: The mitral valve is grossly normal. Trivial mitral valve regurgitation. MV peak gradient, 5.3 mmHg. The mean mitral valve gradient is 2.0 mmHg. Tricuspid Valve: The tricuspid valve is grossly normal. Tricuspid valve regurgitation is mild. Aortic Valve: The aortic valve is tricuspid. Aortic valve regurgitation is not visualized. Aortic valve sclerosis is present, with no evidence of aortic valve stenosis. Aortic valve mean gradient measures 3.0 mmHg. Aortic valve peak gradient measures 6.1  mmHg. Aortic valve area, by VTI measures 2.90 cm. Pulmonic Valve: The pulmonic valve was normal in structure. Pulmonic valve regurgitation is not visualized. Aorta: Aortic dilatation noted. There is borderline dilatation of the ascending aorta, measuring 38 mm. Venous: The inferior vena cava was not well visualized. IAS/Shunts: No atrial level shunt detected by color flow Doppler.  LEFT VENTRICLE PLAX 2D                        Biplane EF (MOD) LVIDd:         5.20 cm         LV Biplane EF:   Left LVIDs:         3.70 cm                          ventricular LV PW:         1.20 cm                          ejection LV IVS:        1.10 cm                          fraction by LVOT diam:     2.20 cm                          2D MOD LV SV:         70                               biplane is LV SV Index:    33                               56.6 %. LVOT Area:     3.80 cm                                Diastology                                LV e' medial:    10.87 cm/s LV Volumes (MOD)  LV E/e' medial:  9.9 LV vol d, MOD    75.5 ml       LV e' lateral:   13.40 cm/s A2C:                           LV E/e' lateral: 8.0 LV vol d, MOD    62.3 ml A4C: LV vol s, MOD    32.2 ml A2C: LV vol s, MOD    28.0 ml A4C: LV SV MOD A2C:   43.3 ml LV SV MOD A4C:   62.3 ml LV SV MOD BP:    40.2 ml RIGHT VENTRICLE             IVC RV Basal diam:  4.40 cm     IVC diam: 3.20 cm RV S prime:     11.90 cm/s TAPSE (M-mode): 2.1 cm LEFT ATRIUM             Index        RIGHT ATRIUM           Index LA diam:        5.10 cm 2.38 cm/m   RA Area:     31.30 cm LA Vol (A2C):   91.7 ml 42.81 ml/m  RA Volume:   104.00 ml 48.55 ml/m LA Vol (A4C):   90.1 ml 42.06 ml/m LA Biplane Vol: 93.0 ml 43.42 ml/m  AORTIC VALVE AV Area (Vmax):    3.02 cm AV Area (Vmean):   2.92 cm AV Area (VTI):     2.90 cm AV Vmax:           123.00 cm/s AV Vmean:          83.700 cm/s AV VTI:            0.241 m AV Peak Grad:      6.1 mmHg AV Mean Grad:      3.0 mmHg LVOT Vmax:         97.65 cm/s LVOT Vmean:        64.300 cm/s LVOT VTI:          0.184 m LVOT/AV VTI ratio: 0.76  AORTA Ao Root diam: 3.40 cm Ao Asc diam:  3.80 cm MITRAL VALVE                TRICUSPID VALVE MV Area (PHT): 7.56 cm     TR Peak grad:   44.1 mmHg MV Area VTI:   3.28 cm     TR Vmax:        332.00 cm/s MV Peak grad:  5.3 mmHg MV Mean grad:  2.0 mmHg     SHUNTS MV Vmax:       1.15 m/s     Systemic VTI:  0.18 m MV Vmean:      68.2 cm/s    Systemic Diam: 2.20 cm MV Decel Time: 100 msec MV E velocity: 107.33 cm/s Zoila Shutter MD Electronically signed by Zoila Shutter MD Signature Date/Time: 03/01/2023/12:15:19 PM    Final    CT ABDOMEN PELVIS WO CONTRAST  Result Date: 02/26/2023 CLINICAL DATA:  Acute lower abdominal pain. EXAM: CT ABDOMEN AND PELVIS WITHOUT CONTRAST TECHNIQUE: Multidetector  CT imaging of the abdomen and pelvis was performed following the standard protocol without IV contrast. RADIATION DOSE REDUCTION: This exam was performed according to the departmental dose-optimization program which includes automated exposure control, adjustment of the mA and/or kV according to patient size and/or use  of iterative reconstruction technique. COMPARISON:  None Available. FINDINGS: Lower chest: Mild right lower lobe atelectasis or pneumonia is noted. Hepatobiliary: No focal liver abnormality is seen. Status post cholecystectomy. No biliary dilatation. Pancreas: Unremarkable. No pancreatic ductal dilatation or surrounding inflammatory changes. Spleen: Normal in size without focal abnormality. 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 Left renal cyst is noted for which no further follow-up is required. No hydronephrosis or renal obstruction is noted. Urinary bladder is unremarkable. Stomach/Bowel: The stomach is unremarkable. There is no evidence of bowel obstruction or inflammation. Vascular/Lymphatic: Aortic atherosclerosis. No enlarged abdominal or pelvic lymph nodes. Reproductive: Uterus and bilateral adnexa are unremarkable. Other: Small fat containing periumbilical hernia. No ascites is noted. Musculoskeletal: No acute or significant osseous findings. IMPRESSION: Mild right lower lobe pneumonia or atelectasis is noted. Small fat containing periumbilical hernia. No acute abnormality seen in the abdomen or pelvis. Aortic Atherosclerosis (ICD10-I70.0). Electronically Signed   By: Lupita Raider M.D.   On: 02/26/2023 13:28   DG Chest 2 View  Result Date: 02/26/2023 CLINICAL DATA:  Shortness of breath.  Abdominal pain EXAM: CHEST - 2 VIEW COMPARISON:  X-ray 06/03/2022 FINDINGS: Enlarged cardiopericardial silhouette. Calcified aorta. Slight vascular congestion. No pneumothorax or effusion. Overlapping cardiac leads. No edema or consolidation. Film is under penetrated and inflated  x-ray. Fixation hardware along the lower cervical spine at the edge of the imaging field. IMPRESSION: Underinflation compared to previous. Enlarged cardiopericardial silhouette with a calcified aorta. Slight vascular congestion. Electronically Signed   By: Karen Kays M.D.   On: 02/26/2023 11:52    Microbiology: Results for orders placed or performed during the hospital encounter of 02/26/23  Urine Culture (for pregnant, neutropenic or urologic patients or patients with an indwelling urinary catheter)     Status: Abnormal   Collection Time: 02/26/23 10:31 AM   Specimen: Urine, Clean Catch  Result Value Ref Range Status   Specimen Description   Final    URINE, CLEAN CATCH Performed at [facility], [address], [place], [place] [address]    Special Requests   Final    NONE Performed at [facility], [address], [place], [place] [address]    Culture MULTIPLE SPECIES PRESENT, SUGGEST RECOLLECTION (A)  Final   Report Status 02/28/2023 FINAL  Final    Labs: CBC: Recent Labs  Lab 02/26/23 1031 02/27/23 0521 02/28/23 0543 03/01/23 0703  WBC 8.0 7.3 11.4* 12.4*  NEUTROABS 5.0  --  9.4* 9.4*  HGB 14.7 13.0 12.0 13.3  HCT 45.2 41.4 39.5 42.8  MCV 105.6* 108.4* 111.3* 110.6*  PLT 159 153 145* 154   Basic Metabolic Panel: Recent Labs  Lab 02/26/23 1031 02/27/23 0521 02/28/23 0543 03/01/23 0703  NA 139 138 139 140  K 3.8 4.3 4.7 3.9  CL 106 106 107 103  CO2 24 23 28 27   GLUCOSE 118* 148* 130* 100*  BUN 17 19 27* 31*  CREATININE 1.02* 0.99 0.90 1.08*  CALCIUM 8.9 8.1* 8.6* 8.9   Liver Function Tests: Recent Labs  Lab 02/26/23 1031  AST 17  ALT 10  ALKPHOS 41  BILITOT 0.8  PROT 7.0  ALBUMIN 4.1   CBG: No results for input(s): "GLUCAP" in the last 168 hours.  Discharge time spent: less than 30 minutes.  Signed: Briant Cedar, MD [facility] 03/01/2023

## 2023-03-01 NOTE — TOC Transition Note (Signed)
Transition of Care [facility]) - CM/SW Discharge Note   Patient Details  Name: Sherri Hart MRN: [id number] Date of Birth: Feb 11, 1946  Transition of Care [facility]) CM/SW Contact:  Adrian Prows, RN Phone Number: 03/01/2023, 3:44 PM   Clinical Narrative:    Order received for nebulizer; spoke w/ pt and she agrees to receiving dme; spoke w/ Jermain at [facility]; nebulizer will be delivered to room; no TOC needs.   Final next level of care: Home/Self Care Barriers to Discharge: No Barriers Identified   Patient Goals and CMS Choice      Discharge Placement                         Discharge Plan and Services Additional resources added to the After Visit Summary for                  DME Arranged: Nebulizer/meds DME Agency: [employer] Date DME Agency Contacted: 03/01/23 Time DME Agency Contacted: [phone] Representative spoke with at DME Agency: Vaughan Basta            Social Determinants of Health (SDOH) Interventions SDOH Screenings   Food Insecurity: No Food Insecurity (02/26/2023)  Housing: Low Risk  (02/26/2023)  Transportation Needs: No Transportation Needs (02/26/2023)  Utilities: Not At Risk (02/26/2023)  Tobacco Use: High Risk (02/26/2023)     Readmission Risk Interventions     No data to display

## 2023-03-01 NOTE — Progress Notes (Signed)
Mobility Specialist - Progress Note   03/01/23 1145  Oxygen Therapy  SpO2 94 %  O2 Device Nasal Cannula  O2 Flow Rate (L/min) 4 L/min  Patient Activity (if Appropriate) Ambulating  Mobility  Activity Ambulated with assistance in hallway  Level of Assistance Standby assist, set-up cues, supervision of patient - no hands on  Assistive Device Front wheel walker  Distance Ambulated (ft) 275 ft  Activity Response Tolerated well  Mobility Referral Yes  $Mobility charge 1 Mobility  Mobility Specialist Start Time (ACUTE ONLY) 1130  Mobility Specialist Stop Time (ACUTE ONLY) 1141  Mobility Specialist Time Calculation (min) (ACUTE ONLY) 11 min   Pt received in bed with  off and agreeable to mobility. No complaints during session. Pt to recliner after session with all needs met.    [facility]

## 2023-04-24 ENCOUNTER — Other Ambulatory Visit: Payer: Self-pay

## 2023-04-24 ENCOUNTER — Encounter (HOSPITAL_COMMUNITY): Payer: Self-pay | Admitting: Emergency Medicine

## 2023-04-24 ENCOUNTER — Emergency Department (HOSPITAL_COMMUNITY)
Admission: EM | Admit: 2023-04-24 | Discharge: 2023-04-25 | Disposition: A | Discharge status: Home / Self Care | Payer: Medicare HMO | Attending: Emergency Medicine | Admitting: Emergency Medicine

## 2023-04-24 ENCOUNTER — Emergency Department (HOSPITAL_COMMUNITY): Payer: Medicare HMO

## 2023-04-24 DIAGNOSIS — J449 Chronic obstructive pulmonary disease, unspecified: Secondary | ICD-10-CM | POA: Insufficient documentation

## 2023-04-24 DIAGNOSIS — M25511 Pain in right shoulder: Secondary | ICD-10-CM | POA: Diagnosis not present

## 2023-04-24 DIAGNOSIS — Z7901 Long term (current) use of anticoagulants: Secondary | ICD-10-CM | POA: Insufficient documentation

## 2023-04-24 DIAGNOSIS — R600 Localized edema: Secondary | ICD-10-CM | POA: Insufficient documentation

## 2023-04-24 DIAGNOSIS — I509 Heart failure, unspecified: Secondary | ICD-10-CM | POA: Insufficient documentation

## 2023-04-24 LAB — CBC WITH DIFFERENTIAL/PLATELET
Abs Immature Granulocytes: 0.03 10*3/uL (ref 0.00–0.07)
Basophils Absolute: 0 10*3/uL (ref 0.0–0.1)
Basophils Relative: 0 %
Eosinophils Absolute: 0.2 10*3/uL (ref 0.0–0.5)
Eosinophils Relative: 2 %
HCT: 46.7 % — ABNORMAL HIGH (ref 36.0–46.0)
Hemoglobin: 14.9 g/dL (ref 12.0–15.0)
Immature Granulocytes: 0 %
Lymphocytes Relative: 21 %
Lymphs Abs: 1.9 10*3/uL (ref 0.7–4.0)
MCH: 33.6 pg (ref 26.0–34.0)
MCHC: 31.9 g/dL (ref 30.0–36.0)
MCV: 105.4 fL — ABNORMAL HIGH (ref 80.0–100.0)
Monocytes Absolute: 0.7 10*3/uL (ref 0.1–1.0)
Monocytes Relative: 8 %
Neutro Abs: 6.2 10*3/uL (ref 1.7–7.7)
Neutrophils Relative %: 69 %
Platelets: 168 10*3/uL (ref 150–400)
RBC: 4.43 MIL/uL (ref 3.87–5.11)
RDW: 13.7 % (ref 11.5–15.5)
WBC: 9.2 10*3/uL (ref 4.0–10.5)
nRBC: 0 % (ref 0.0–0.2)

## 2023-04-24 LAB — BRAIN NATRIURETIC PEPTIDE: B Natriuretic Peptide: 165.6 pg/mL — ABNORMAL HIGH (ref 0.0–100.0)

## 2023-04-24 LAB — COMPREHENSIVE METABOLIC PANEL
ALT: 15 U/L (ref 0–44)
AST: 20 U/L (ref 15–41)
Albumin: 4.4 g/dL (ref 3.5–5.0)
Alkaline Phosphatase: 50 U/L (ref 38–126)
Anion gap: 11 (ref 5–15)
BUN: 13 mg/dL (ref 8–23)
CO2: 24 mmol/L (ref 22–32)
Calcium: 9.6 mg/dL (ref 8.9–10.3)
Chloride: 104 mmol/L (ref 98–111)
Creatinine, Ser: 0.85 mg/dL (ref 0.44–1.00)
GFR, Estimated: >60 mL/min (ref >60)
Glucose, Bld: 98 mg/dL (ref 70–99)
Potassium: 3.6 mmol/L (ref 3.5–5.1)
Sodium: 139 mmol/L (ref 135–145)
Total Bilirubin: 0.8 mg/dL (ref 0.3–1.2)
Total Protein: 7.7 g/dL (ref 6.5–8.1)

## 2023-04-24 LAB — TROPONIN I (HIGH SENSITIVITY): Troponin I (High Sensitivity): 10 ng/L (ref <18)

## 2023-04-24 MED ORDER — HYDROMORPHONE HCL 1 MG/ML IJ SOLN
1.0000 mg | Freq: Once | INTRAMUSCULAR | Status: AC
  Administered 2023-04-25: 1 mg via INTRAVENOUS
  Filled 2023-04-24: qty 1

## 2023-04-24 MED ORDER — HYDROMORPHONE HCL 1 MG/ML IJ SOLN
0.5000 mg | Freq: Once | INTRAMUSCULAR | Status: AC
  Administered 2023-04-24: 0.5 mg via INTRAVENOUS
  Filled 2023-04-24: qty 1

## 2023-04-24 MED ORDER — FUROSEMIDE 10 MG/ML IJ SOLN
40.0000 mg | Freq: Once | INTRAMUSCULAR | Status: AC
  Administered 2023-04-25: 40 mg via INTRAVENOUS
  Filled 2023-04-24: qty 4

## 2023-04-24 NOTE — ED Provider Notes (Signed)
[place] EMERGENCY DEPARTMENT AT [facility] Provider Note   CSN: [id number] Arrival date & time: 04/24/23  1750     History  Chief Complaint  Patient presents with   Leg Swelling    Sherri Hart is a 77 y.o. female with history of A-fib on Xarelto, COPD, CHF, presents to the ER because her primary care doctor had concerned about the swelling in her legs.  She was at her primary earlier for right shoulder pain which he believes is a rotator cuff issue.  However, upon evaluation she was seen to have significant bilateral lower extremity edema.  She states her edema has increased in the past day.  She denies any new shortness of breath.  States she uses oxygen 2L nasal cannula as needed at home, usually at night.  She has not had an increase in usage of her oxygen.  She reports she is compliant with her 40 mg of Lasix daily.  Denies any recent periods of immobilization-no recent plane or car rides, no hormonal therapies, no recent surgeries.  No pain in her calves.  Right shoulder pain came on gradually, no injuries.  Denies any falls.  HPI     Home Medications Prior to Admission medications   Medication Sig Start Date End Date Taking? Authorizing Provider  furosemide (LASIX) 20 MG tablet Take 3 tablets (60 mg total) by mouth daily for 2 days. 04/25/23 04/27/23 Yes Arabella Merles, PA-C  acetaminophen (TYLENOL) 500 MG tablet Take 500-1,000 mg by mouth every 6 (six) hours as needed for mild pain or headache.    [provider]  albuterol (VENTOLIN HFA) 108 (90 Base) MCG/ACT inhaler Inhale 2 puffs into the lungs every 6 (six) hours as needed for wheezing or shortness of breath. 09/10/21   Andrey Campanile, MD  alendronate (FOSAMAX) 70 MG tablet Take 70 mg by mouth once a week. Take with a full glass of water on an empty stomach.    [provider]  BELSOMRA 10 MG TABS Take 1.5 tablets by mouth at bedtime as needed (For sleep). 01/04/23   [provider]  DILT-XR 180 MG 24 hr capsule Take 180 mg by mouth daily. 12/17/22   [provider]  ergocalciferol (VITAMIN D2) 1.25 MG (50000 UT) capsule Take 50,000 Units by mouth once a week.    [provider]  fluticasone (FLONASE) 50 MCG/ACT nasal spray Place 2 sprays into both nostrils daily. 03/02/23 04/01/23  Briant Cedar, MD  furosemide (LASIX) 40 MG tablet Take 1 tablet (40 mg total) by mouth daily. 02/04/21   Almon Hercules, MD  guaiFENesin (MUCINEX) 600 MG 12 hr tablet Take 1 tablet (600 mg total) by mouth 2 (two) times daily. Patient not taking: Reported on 06/03/2022 09/10/21   Andrey Campanile, MD  ipratropium-albuterol (DUONEB) 0.5-2.5 (3) MG/3ML SOLN Take 3 mLs by nebulization every 6 (six) hours as needed. 03/01/23   Briant Cedar, MD  loratadine (CLARITIN) 10 MG tablet Take 10 mg by mouth daily.    [provider]  nicotine (NICODERM CQ - DOSED IN MG/24 HOURS) 14 mg/24hr patch Place 1 patch (14 mg total) onto the skin daily. Patient not taking: Reported on 06/03/2022 09/11/21   Andrey Campanile, MD  pravastatin (PRAVACHOL) 20 MG tablet Take 20 mg by mouth at bedtime.    [provider]  rivaroxaban (XARELTO) 20 MG TABS tablet TAKE 1 TABLET(20 MG) BY MOUTH DAILY WITH SUPPER Patient taking differently: Take  20 mg by mouth daily with supper. 11/01/22   Hilty, Lisette Abu, MD  SYMBICORT 80-4.5 MCG/ACT inhaler Inhale 2 puffs into the lungs in the morning and at bedtime. 01/30/23   [provider]  umeclidinium-vilanterol (ANORO ELLIPTA) 62.5-25 MCG/ACT AEPB Inhale 1 puff into the lungs daily. Patient not taking: Reported on 06/03/2022 09/10/21   Andrey Campanile, MD      Allergies    Aspirin and Penicillins    Review of Systems   Review of Systems  Physical Exam Updated Vital Signs BP (!) 163/100   Pulse 98   Temp 98 F (36.7 C)   Resp 16   SpO2 93%  Physical Exam Vitals and nursing note reviewed.  Constitutional:       General: She is not in acute distress.    Appearance: She is well-developed.     Comments: Appears uncomfortable and holding her right shoulder  HENT:     Head: Normocephalic and atraumatic.  Eyes:     Conjunctiva/sclera: Conjunctivae normal.  Cardiovascular:     Rate and Rhythm: Normal rate and regular rhythm.     Heart sounds: No murmur heard.    Comments: Dorsalis pedis pulse 2+ bilaterally Pulmonary:     Effort: Pulmonary effort is normal. No respiratory distress.     Breath sounds: Normal breath sounds.     Comments: Patient audibly mouth breathing, states this is her baseline Abdominal:     Palpations: Abdomen is soft.     Tenderness: There is no abdominal tenderness.  Musculoskeletal:        General: No swelling.     Cervical back: Neck supple.     Comments: No tenderness to palpation of the calves bilaterally  Skin:    General: Skin is warm and dry.     Capillary Refill: Capillary refill takes less than 2 seconds.     Comments: 1+ pitting edema bilaterally that extends from the ankles up to the knees, no involvement of the feet  Neurological:     Mental Status: She is alert.  Psychiatric:        Mood and Affect: Mood normal.     ED Results / Procedures / Treatments   Labs (all labs ordered are listed, but only abnormal results are displayed) Labs Reviewed  CBC WITH DIFFERENTIAL/PLATELET - Abnormal; Notable for the following components:      Result Value   HCT 46.7 (*)    MCV 105.4 (*)    All other components within normal limits  BRAIN NATRIURETIC PEPTIDE - Abnormal; Notable for the following components:   B Natriuretic Peptide 165.6 (*)    All other components within normal limits  COMPREHENSIVE METABOLIC PANEL  TROPONIN I (HIGH SENSITIVITY)  TROPONIN I (HIGH SENSITIVITY)    EKG EKG Interpretation Date/Time:  Thursday April 24 2023 18:31:09 EDT Ventricular Rate:  92 PR Interval:  136 QRS Duration:  95 QT Interval:  359 QTC Calculation: 445 R  Axis:   52  Text Interpretation: Artifact present, suspect atrial fibrillation Artifact in lead(s) I II III aVR aVL aVF Confirmed by Alvira Monday ([id number]) on 04/24/2023 11:39:31 PM  Radiology DG Chest Portable 1 View  Result Date: 04/24/2023 CLINICAL DATA:  CHF, shortness of breath, bilateral leg swelling. EXAM: PORTABLE CHEST 1 VIEW COMPARISON:  02/26/2023. FINDINGS: The heart is enlarged and the mediastinal contour is within normal limits. There is atherosclerotic calcification of the aorta. The pulmonary vasculature is distended. Interstitial prominence is noted at the lung bases  bilaterally. No definite effusion or pneumothorax. Cervical spinal fusion hardware is noted. IMPRESSION: 1. Cardiomegaly with pulmonary vascular congestion. 2. Interstitial prominence at the lung bases, possible edema or infiltrate. Electronically Signed   By: Thornell Sartorius M.D.   On: 04/24/2023 22:51    Procedures Procedures    Medications Ordered in ED Medications  HYDROmorphone (DILAUDID) injection 0.5 mg (0.5 mg Intravenous Given 04/24/23 2057)  HYDROmorphone (DILAUDID) injection 1 mg (1 mg Intravenous Given 04/25/23 0011)  furosemide (LASIX) injection 40 mg (40 mg Intravenous Given 04/25/23 0010)    ED Course/ Medical Decision Making/ A&P                                 Medical Decision Making Amount and/or Complexity of Data Reviewed Labs: ordered. Radiology: ordered.  Risk Prescription drug management.   77 y.o. female with pertinent past medical history of A-fib on Xarelto, COPD, CHF, presents to the ED at the recommendation of her PCP for bilateral lower extremity swelling  Differential diagnosis includes but is not limited to CHF exacerbation, DVT, lymphedema, COPD exacerbation  ED Course:  Patient presents to the ED as her PCP was concerned for her bilateral lower extremity edema.  She states she normally has a little bit of swelling in her legs, but it has worsened significantly in the  past day.  She denies any new shortness of breath.  She has been compliant with her daily Lasix.  No risk factors for DVT, no tenderness to palpation of the calves, and given edema is bilateral, low suspicion for DVT at this time.  Given no chest pain, no new shortness of breath, troponin at 10 and 7, EKG without signs of ischemia, low suspicion for ACS at this time.  CMP and CBC unremarkable.  Her BNP is at 165, however this is lower than previous where she is normally in the upper 200s and even as high as 400.  She is currently satting well at 93% on room air, not short of breath.  No tachycardia or fever.  She has an elevated blood pressure here 154/105, however suspect this is secondary to her arm pain.  Upon re-evaluation, patient reports arm pain had improved with the 0.5mg  dilaudid.  She states that her pain is returning, asks if she can get any more pain medication before she leaves.  1 mg Dilaudid replaced.  We will give her 40 mg Lasix through her IV prior to discharge to help get some of her fluid off. Will plan have her take 60mg  lasix for the next 2 days, then go back to 40mg  daily. Will have her follow up closely with PCP for further management of lasix dose and rotator cuff injury. Discussed this plan with Dr. Dalene Seltzer who is in agreement.  Impression: Bilateral lower extremity edema  Disposition:  The patient was discharged home with instructions to increase Lasix dose to 60 mg for the next 2 days, then return to 40mg  daily.  Follow-up next Monday or Tuesday with her PCP. Return precautions given.  Lab Tests: I Ordered, and personally interpreted labs.  The pertinent results include:   CBC, CMP unremarkable Initial troponin of 10, repeat of 7 BNP 165, this is lower than 1 month ago where she was at 400  Imaging Studies ordered: I ordered imaging studies including chest x-ray I independently visualized the imaging with scope of interpretation limited to determining acute life  threatening conditions related  to emergency care. Imaging showed pulmonary vascular congestion I agree with the radiologist interpretation   Cardiac Monitoring: / EKG: The patient was maintained on a cardiac monitor.  I personally viewed and interpreted the cardiac monitored which showed an underlying rhythm of: A fib    Co morbidities that complicate the patient evaluation  A-fib on Xarelto, COPD, CHF            Final Clinical Impression(s) / ED Diagnoses Final diagnoses:  Bilateral lower extremity edema  Acute pain of right shoulder    Rx / DC Orders ED Discharge Orders          Ordered    furosemide (LASIX) 20 MG tablet  Daily        04/25/23 0018              Arabella Merles, PA-C 04/25/23 0018    Alvira Monday, MD 04/25/23 2244

## 2023-04-24 NOTE — ED Provider Notes (Incomplete)
[place] EMERGENCY DEPARTMENT AT [facility] Provider Note   CSN: [id number] Arrival date & time: 04/24/23  1750     History {Add pertinent medical, surgical, social history, OB history to HPI:1} Chief Complaint  Patient presents with  . Leg Swelling    Sherri Hart is a 77 y.o. female with history of A-fib on Xarelto, COPD, CHF, presents to the ER because her primary care doctor had concerned about the swelling in her legs.  She was at her primary earlier for right shoulder pain which he believes is a rotator cuff issue.  However, upon evaluation she was seen to have significant bilateral lower extremity edema.  She states her edema has increased in the past day.  She denies any new shortness of breath.  States she uses oxygen 2L nasal cannula as needed at home, usually at night.  She has not had an increase in usage of her oxygen.  She reports she is compliant with her 40 mg of Lasix daily.  Denies any recent periods of immobilization-no recent plane or car rides, no hormonal therapies, no recent surgeries.  No pain in her calves.  Right shoulder pain came on gradually, no injuries.  Denies any falls.  HPI     Home Medications Prior to Admission medications   Medication Sig Start Date End Date Taking? Authorizing Provider  acetaminophen (TYLENOL) 500 MG tablet Take 500-1,000 mg by mouth every 6 (six) hours as needed for mild pain or headache.    [provider]  albuterol (VENTOLIN HFA) 108 (90 Base) MCG/ACT inhaler Inhale 2 puffs into the lungs every 6 (six) hours as needed for wheezing or shortness of breath. 09/10/21   Andrey Campanile, MD  alendronate (FOSAMAX) 70 MG tablet Take 70 mg by mouth once a week. Take with a full glass of water on an empty stomach.    [provider]  BELSOMRA 10 MG TABS Take 1.5 tablets by mouth at bedtime as needed (For sleep). 01/04/23   [provider]  DILT-XR 180 MG 24 hr capsule Take 180 mg by mouth  daily. 12/17/22   [provider]  ergocalciferol (VITAMIN D2) 1.25 MG (50000 UT) capsule Take 50,000 Units by mouth once a week.    [provider]  fluticasone (FLONASE) 50 MCG/ACT nasal spray Place 2 sprays into both nostrils daily. 03/02/23 04/01/23  Briant Cedar, MD  furosemide (LASIX) 40 MG tablet Take 1 tablet (40 mg total) by mouth daily. 02/04/21   Almon Hercules, MD  guaiFENesin (MUCINEX) 600 MG 12 hr tablet Take 1 tablet (600 mg total) by mouth 2 (two) times daily. Patient not taking: Reported on 06/03/2022 09/10/21   Andrey Campanile, MD  ipratropium-albuterol (DUONEB) 0.5-2.5 (3) MG/3ML SOLN Take 3 mLs by nebulization every 6 (six) hours as needed. 03/01/23   Briant Cedar, MD  loratadine (CLARITIN) 10 MG tablet Take 10 mg by mouth daily.    [provider]  nicotine (NICODERM CQ - DOSED IN MG/24 HOURS) 14 mg/24hr patch Place 1 patch (14 mg total) onto the skin daily. Patient not taking: Reported on 06/03/2022 09/11/21   Andrey Campanile, MD  pravastatin (PRAVACHOL) 20 MG tablet Take 20 mg by mouth at bedtime.    [provider]  rivaroxaban (XARELTO) 20 MG TABS tablet TAKE 1 TABLET(20 MG) BY MOUTH DAILY WITH SUPPER Patient taking differently: Take 20 mg by mouth daily with supper. 11/01/22   Hilty, Lisette Abu, MD  SYMBICORT 80-4.5 MCG/ACT inhaler Inhale 2 puffs into the lungs in the morning and at bedtime. 01/30/23   [provider]  umeclidinium-vilanterol (ANORO ELLIPTA) 62.5-25 MCG/ACT AEPB Inhale 1 puff into the lungs daily. Patient not taking: Reported on 06/03/2022 09/10/21   Andrey Campanile, MD      Allergies    Aspirin and Penicillins    Review of Systems   Review of Systems  Physical Exam Updated Vital Signs BP (!) 154/105 (BP Location: Left Arm)   Pulse 94   Temp 97.6 F (36.4 C) (Oral)   Resp 20   SpO2 90%  Physical Exam Vitals and nursing note reviewed.  Constitutional:      General: She is not in acute  distress.    Appearance: She is well-developed.     Comments: Appears uncomfortable and holding her right shoulder  HENT:     Head: Normocephalic and atraumatic.  Eyes:     Conjunctiva/sclera: Conjunctivae normal.  Cardiovascular:     Rate and Rhythm: Normal rate and regular rhythm.     Heart sounds: No murmur heard.    Comments: Dorsalis pedis pulse 2+ bilaterally Pulmonary:     Effort: Pulmonary effort is normal. No respiratory distress.     Breath sounds: Normal breath sounds.     Comments: Patient audibly mouth breathing, states this is her baseline Abdominal:     Palpations: Abdomen is soft.     Tenderness: There is no abdominal tenderness.  Musculoskeletal:        General: No swelling.     Cervical back: Neck supple.     Comments: No tenderness to palpation of the calves bilaterally  Skin:    General: Skin is warm and dry.     Capillary Refill: Capillary refill takes less than 2 seconds.     Comments: 1+ pitting edema bilaterally that extends from the ankles up to the knees, no involvement of the feet  Neurological:     Mental Status: She is alert.  Psychiatric:        Mood and Affect: Mood normal.     ED Results / Procedures / Treatments   Labs (all labs ordered are listed, but only abnormal results are displayed) Labs Reviewed  CBC WITH DIFFERENTIAL/PLATELET  COMPREHENSIVE METABOLIC PANEL  BRAIN NATRIURETIC PEPTIDE  TROPONIN I (HIGH SENSITIVITY)    EKG None  Radiology No results found.  Procedures Procedures  {Document cardiac monitor, telemetry assessment procedure when appropriate:1}  Medications Ordered in ED Medications  HYDROmorphone (DILAUDID) injection 0.5 mg (has no administration in time range)    ED Course/ Medical Decision Making/ A&P   {   Click here for ABCD2, HEART and other calculatorsREFRESH Note before signing :1}                              Medical Decision Making Amount and/or Complexity of Data Reviewed Labs:  ordered. Radiology: ordered.  Risk Prescription drug management.   77 y.o. female with pertinent past medical history of A-fib on Xarelto, COPD, CHF, presents to the ED at the recommendation of her PCP for bilateral lower extremity swelling  Differential diagnosis includes but is not limited to CHF exacerbation, DVT, lymphedema, COPD exacerbation  ED Course:  Patient presents to the ED as her PCP was concerned for her bilateral lower extremity edema.  She states she normally has a little bit of swelling in her legs, but it has worsened significantly  in the past day.  She denies any new shortness of breath.  She has been compliant with her daily Lasix.  No risk factors for DVT, no tenderness to palpation of the calves, and given edema is bilateral, low suspicion for DVT at this time.  Given no chest pain, no new shortness of breath, troponin at 10, low suspicion for ACS at this time.  CMP and CBC unremarkable.  Her BNP is at 165, however this is lower than previous where she is normally in the upper 200s and even as high as 400.  She is currently satting well at 93% on room air, not short of breath.  No tachycardia or fever.  She has an elevated blood pressure here 154/105, however suspect this is secondary to her arm pain.  Upon re-evaluation, patient reports arm pain had improved with the 0.5mg  dilaudid.  She states that her pain is returning, asks if she can get any more pain medication before she leaves.  1 mg Dilaudid replaced.  We will give her 40 mg Lasix through her IV prior to discharge to help get some of her fluid off. Will plan have her take 60mg  lasix for the next 2 days, then go back to 40mg  daily. Will have her follow up closely with PCP for further management of lasix dose and rotator cuff injury. Discussed this plan with Dr. Dalene Seltzer who is in agreement.  Impression: Bilateral lower extremity edema  Disposition:  The patient was discharged home with instructions to increase  Lasix dose to 60 mg for the next 2 days, then return to 40mg  daily.  Follow-up next Monday or Tuesday with her PCP. Return precautions given.  Lab Tests: I Ordered, and personally interpreted labs.  The pertinent results include:   CBC, CMP unremarkable Troponin 10 BNP 165, this is lower than 1 month ago where she was at 400  Imaging Studies ordered: I ordered imaging studies including chest x-ray I independently visualized the imaging with scope of interpretation limited to determining acute life threatening conditions related to emergency care. Imaging showed pulmonary vascular congestion I agree with the radiologist interpretation   Cardiac Monitoring: / EKG: The patient was maintained on a cardiac monitor.  I personally viewed and interpreted the cardiac monitored which showed an underlying rhythm of: A fib    Co morbidities that complicate the patient evaluation  A-fib on Xarelto, COPD, CHF      {Document critical care time when appropriate:1} {Document review of labs and clinical decision tools ie heart score, Chads2Vasc2 etc:1}  {Document your independent review of radiology images, and any outside records:1} {Document your discussion with family members, caretakers, and with consultants:1} {Document social determinants of health affecting pt's care:1} {Document your decision making why or why not admission, treatments were needed:1} Final Clinical Impression(s) / ED Diagnoses Final diagnoses:  None    Rx / DC Orders ED Discharge Orders     None

## 2023-04-24 NOTE — ED Triage Notes (Addendum)
Patient presents from [facility] with an increase in pedal edema. MD wanted her sent here.  She complains of right arm pain which increases with movement.    HX: Afib COPD, O2 at night   EMS vitals: 150/84 BP 89 HR 92% SPO2 on room air

## 2023-04-25 LAB — TROPONIN I (HIGH SENSITIVITY): Troponin I (High Sensitivity): 7 ng/L (ref <18)

## 2023-04-25 MED ORDER — FUROSEMIDE 20 MG PO TABS: 60.0000 mg | ORAL_TABLET | Freq: Every day | ORAL | 0 refills | Status: DC

## 2023-04-25 NOTE — Discharge Instructions (Addendum)
Please increase your Lasix dose to 60 mg daily for the next 2 days.  After this, go back to 40 mg daily.  Follow-up with your PCP on Monday to discuss dosage of your Lasix and recheck of symptoms.  Return to the ER if you have any shortness of breath, difficulty breathing, worsening chest pain, dizziness, jaw pain, left arm or shoulder pain, abdominal pain, fever.

## 2023-11-19 ENCOUNTER — Other Ambulatory Visit: Payer: Self-pay

## 2023-11-19 ENCOUNTER — Encounter (HOSPITAL_COMMUNITY): Payer: Self-pay

## 2023-11-19 ENCOUNTER — Emergency Department (HOSPITAL_COMMUNITY)

## 2023-11-19 ENCOUNTER — Inpatient Hospital Stay (HOSPITAL_COMMUNITY)
Admission: EM | Admit: 2023-11-19 | Discharge: 2023-11-28 | DRG: 189 | Disposition: A | Discharge status: Home / Self Care | Attending: Internal Medicine | Admitting: Internal Medicine

## 2023-11-19 DIAGNOSIS — Z886 Allergy status to analgesic agent status: Secondary | ICD-10-CM

## 2023-11-19 DIAGNOSIS — Z825 Family history of asthma and other chronic lower respiratory diseases: Secondary | ICD-10-CM | POA: Diagnosis not present

## 2023-11-19 DIAGNOSIS — I480 Paroxysmal atrial fibrillation: Secondary | ICD-10-CM | POA: Diagnosis present

## 2023-11-19 DIAGNOSIS — F419 Anxiety disorder, unspecified: Secondary | ICD-10-CM | POA: Diagnosis present

## 2023-11-19 DIAGNOSIS — Z96651 Presence of right artificial knee joint: Secondary | ICD-10-CM | POA: Diagnosis present

## 2023-11-19 DIAGNOSIS — R0609 Other forms of dyspnea: Secondary | ICD-10-CM | POA: Diagnosis not present

## 2023-11-19 DIAGNOSIS — E785 Hyperlipidemia, unspecified: Secondary | ICD-10-CM | POA: Diagnosis present

## 2023-11-19 DIAGNOSIS — J9811 Atelectasis: Secondary | ICD-10-CM | POA: Diagnosis present

## 2023-11-19 DIAGNOSIS — J9621 Acute and chronic respiratory failure with hypoxia: Principal | ICD-10-CM | POA: Diagnosis present

## 2023-11-19 DIAGNOSIS — D72829 Elevated white blood cell count, unspecified: Secondary | ICD-10-CM | POA: Diagnosis present

## 2023-11-19 DIAGNOSIS — I2781 Cor pulmonale (chronic): Secondary | ICD-10-CM | POA: Diagnosis not present

## 2023-11-19 DIAGNOSIS — Z1152 Encounter for screening for COVID-19: Secondary | ICD-10-CM

## 2023-11-19 DIAGNOSIS — Z9981 Dependence on supplemental oxygen: Secondary | ICD-10-CM

## 2023-11-19 DIAGNOSIS — J441 Chronic obstructive pulmonary disease with (acute) exacerbation: Secondary | ICD-10-CM | POA: Diagnosis present

## 2023-11-19 DIAGNOSIS — Z716 Tobacco abuse counseling: Secondary | ICD-10-CM

## 2023-11-19 DIAGNOSIS — I4891 Unspecified atrial fibrillation: Secondary | ICD-10-CM | POA: Diagnosis not present

## 2023-11-19 DIAGNOSIS — J9622 Acute and chronic respiratory failure with hypercapnia: Secondary | ICD-10-CM | POA: Diagnosis present

## 2023-11-19 DIAGNOSIS — I5033 Acute on chronic diastolic (congestive) heart failure: Secondary | ICD-10-CM | POA: Diagnosis present

## 2023-11-19 DIAGNOSIS — G8929 Other chronic pain: Secondary | ICD-10-CM | POA: Diagnosis present

## 2023-11-19 DIAGNOSIS — T380X5A Adverse effect of glucocorticoids and synthetic analogues, initial encounter: Secondary | ICD-10-CM | POA: Diagnosis present

## 2023-11-19 DIAGNOSIS — J439 Emphysema, unspecified: Secondary | ICD-10-CM | POA: Diagnosis present

## 2023-11-19 DIAGNOSIS — Z6841 Body Mass Index (BMI) 40.0 and over, adult: Secondary | ICD-10-CM | POA: Diagnosis not present

## 2023-11-19 DIAGNOSIS — M1990 Unspecified osteoarthritis, unspecified site: Secondary | ICD-10-CM | POA: Diagnosis present

## 2023-11-19 DIAGNOSIS — Z880 Allergy status to penicillin: Secondary | ICD-10-CM

## 2023-11-19 DIAGNOSIS — J9601 Acute respiratory failure with hypoxia: Secondary | ICD-10-CM | POA: Diagnosis not present

## 2023-11-19 DIAGNOSIS — E669 Obesity, unspecified: Secondary | ICD-10-CM | POA: Diagnosis present

## 2023-11-19 DIAGNOSIS — Z79899 Other long term (current) drug therapy: Secondary | ICD-10-CM | POA: Diagnosis not present

## 2023-11-19 DIAGNOSIS — Z7983 Long term (current) use of bisphosphonates: Secondary | ICD-10-CM

## 2023-11-19 DIAGNOSIS — Z8701 Personal history of pneumonia (recurrent): Secondary | ICD-10-CM

## 2023-11-19 DIAGNOSIS — F17210 Nicotine dependence, cigarettes, uncomplicated: Secondary | ICD-10-CM | POA: Diagnosis present

## 2023-11-19 DIAGNOSIS — I2609 Other pulmonary embolism with acute cor pulmonale: Secondary | ICD-10-CM | POA: Diagnosis present

## 2023-11-19 DIAGNOSIS — I272 Pulmonary hypertension, unspecified: Secondary | ICD-10-CM | POA: Diagnosis present

## 2023-11-19 DIAGNOSIS — J449 Chronic obstructive pulmonary disease, unspecified: Secondary | ICD-10-CM | POA: Diagnosis not present

## 2023-11-19 DIAGNOSIS — Z7951 Long term (current) use of inhaled steroids: Secondary | ICD-10-CM | POA: Diagnosis not present

## 2023-11-19 DIAGNOSIS — I5030 Unspecified diastolic (congestive) heart failure: Secondary | ICD-10-CM | POA: Diagnosis not present

## 2023-11-19 DIAGNOSIS — Z7901 Long term (current) use of anticoagulants: Secondary | ICD-10-CM | POA: Diagnosis not present

## 2023-11-19 DIAGNOSIS — R0602 Shortness of breath: Secondary | ICD-10-CM | POA: Diagnosis present

## 2023-11-19 LAB — BLOOD GAS, ARTERIAL
Acid-Base Excess: 0.4 mmol/L (ref 0.0–2.0)
Bicarbonate: 29.4 mmol/L — ABNORMAL HIGH (ref 20.0–28.0)
Drawn by: 31394
O2 Content: 6 L/min
O2 Saturation: 94 %
Patient temperature: 36.6
pCO2 arterial: 66 mmHg (ref 32–48)
pH, Arterial: 7.26 — ABNORMAL LOW (ref 7.35–7.45)
pO2, Arterial: 66 mmHg — ABNORMAL LOW (ref 83–108)

## 2023-11-19 LAB — COMPREHENSIVE METABOLIC PANEL
ALT: 11 U/L (ref 0–44)
AST: 18 U/L (ref 15–41)
Albumin: 3.7 g/dL (ref 3.5–5.0)
Alkaline Phosphatase: 49 U/L (ref 38–126)
Anion gap: 9 (ref 5–15)
BUN: 18 mg/dL (ref 8–23)
CO2: 22 mmol/L (ref 22–32)
Calcium: 8.2 mg/dL — ABNORMAL LOW (ref 8.9–10.3)
Chloride: 107 mmol/L (ref 98–111)
Creatinine, Ser: 0.89 mg/dL (ref 0.44–1.00)
GFR, Estimated: >60 mL/min (ref >60)
Glucose, Bld: 136 mg/dL — ABNORMAL HIGH (ref 70–99)
Potassium: 3.5 mmol/L (ref 3.5–5.1)
Sodium: 138 mmol/L (ref 135–145)
Total Bilirubin: 1.2 mg/dL (ref 0.0–1.2)
Total Protein: 6.7 g/dL (ref 6.5–8.1)

## 2023-11-19 LAB — CBC
HCT: 44.9 % (ref 36.0–46.0)
Hemoglobin: 13.9 g/dL (ref 12.0–15.0)
MCH: 32.9 pg (ref 26.0–34.0)
MCHC: 31 g/dL (ref 30.0–36.0)
MCV: 106.1 fL — ABNORMAL HIGH (ref 80.0–100.0)
Platelets: 176 10*3/uL (ref 150–400)
RBC: 4.23 MIL/uL (ref 3.87–5.11)
RDW: 14.4 % (ref 11.5–15.5)
WBC: 14.8 10*3/uL — ABNORMAL HIGH (ref 4.0–10.5)
nRBC: 0 % (ref 0.0–0.2)

## 2023-11-19 LAB — RESP PANEL BY RT-PCR (RSV, FLU A&B, COVID)  RVPGX2
Influenza A by PCR: NEGATIVE
Influenza B by PCR: NEGATIVE
Resp Syncytial Virus by PCR: NEGATIVE
SARS Coronavirus 2 by RT PCR: NEGATIVE

## 2023-11-19 LAB — I-STAT CG4 LACTIC ACID, ED: Lactic Acid, Venous: 0.4 mmol/L — ABNORMAL LOW (ref 0.5–1.9)

## 2023-11-19 MED ORDER — LORATADINE 10 MG PO TABS
10.0000 mg | ORAL_TABLET | Freq: Every day | ORAL | Status: DC
  Administered 2023-11-19 – 2023-11-28 (×10): 10 mg via ORAL
  Filled 2023-11-19 (×10): qty 1

## 2023-11-19 MED ORDER — ONDANSETRON HCL 4 MG PO TABS: 4.0000 mg | ORAL_TABLET | Freq: Four times a day (QID) | ORAL | Status: DC | PRN

## 2023-11-19 MED ORDER — SODIUM CHLORIDE 0.9 % IV SOLN
500.0000 mg | INTRAVENOUS | Status: AC
  Administered 2023-11-20 – 2023-11-23 (×4): 500 mg via INTRAVENOUS
  Filled 2023-11-19 (×4): qty 5

## 2023-11-19 MED ORDER — ALBUTEROL SULFATE (2.5 MG/3ML) 0.083% IN NEBU
2.5000 mg | INHALATION_SOLUTION | RESPIRATORY_TRACT | Status: DC | PRN
  Administered 2023-11-20: 2.5 mg via RESPIRATORY_TRACT
  Filled 2023-11-19: qty 3

## 2023-11-19 MED ORDER — PRAVASTATIN SODIUM 20 MG PO TABS
20.0000 mg | ORAL_TABLET | Freq: Every day | ORAL | Status: DC
  Administered 2023-11-19 – 2023-11-27 (×9): 20 mg via ORAL
  Filled 2023-11-19 (×9): qty 1

## 2023-11-19 MED ORDER — RIVAROXABAN 20 MG PO TABS
20.0000 mg | ORAL_TABLET | Freq: Every day | ORAL | Status: DC
  Administered 2023-11-19 – 2023-11-27 (×9): 20 mg via ORAL
  Filled 2023-11-19 (×9): qty 1

## 2023-11-19 MED ORDER — IPRATROPIUM-ALBUTEROL 0.5-2.5 (3) MG/3ML IN SOLN
3.0000 mL | Freq: Four times a day (QID) | RESPIRATORY_TRACT | Status: DC
  Administered 2023-11-19 – 2023-11-22 (×11): 3 mL via RESPIRATORY_TRACT
  Filled 2023-11-19 (×13): qty 3

## 2023-11-19 MED ORDER — SUVOREXANT 10 MG PO TABS: 1.5000 | ORAL_TABLET | Freq: Every evening | ORAL | Status: DC | PRN

## 2023-11-19 MED ORDER — FUROSEMIDE 40 MG PO TABS
40.0000 mg | ORAL_TABLET | Freq: Every day | ORAL | Status: DC
  Administered 2023-11-19 – 2023-11-21 (×3): 40 mg via ORAL
  Filled 2023-11-19 (×3): qty 1

## 2023-11-19 MED ORDER — DILTIAZEM HCL ER COATED BEADS 180 MG PO CP24
180.0000 mg | ORAL_CAPSULE | Freq: Every day | ORAL | Status: DC
  Administered 2023-11-19 – 2023-11-27 (×9): 180 mg via ORAL
  Filled 2023-11-19 (×9): qty 1

## 2023-11-19 MED ORDER — ACETAMINOPHEN 650 MG RE SUPP: 650.0000 mg | Freq: Four times a day (QID) | RECTAL | Status: DC | PRN

## 2023-11-19 MED ORDER — METHYLPREDNISOLONE SODIUM SUCC 40 MG IJ SOLR
40.0000 mg | Freq: Two times a day (BID) | INTRAMUSCULAR | Status: DC
  Administered 2023-11-19 – 2023-11-23 (×8): 40 mg via INTRAVENOUS
  Filled 2023-11-19 (×8): qty 1

## 2023-11-19 MED ORDER — ACETAMINOPHEN 325 MG PO TABS
650.0000 mg | ORAL_TABLET | Freq: Four times a day (QID) | ORAL | Status: DC | PRN
  Administered 2023-11-20: 650 mg via ORAL
  Filled 2023-11-19: qty 2

## 2023-11-19 MED ORDER — GUAIFENESIN ER 600 MG PO TB12
600.0000 mg | ORAL_TABLET | Freq: Two times a day (BID) | ORAL | Status: DC
  Administered 2023-11-19 – 2023-11-28 (×18): 600 mg via ORAL
  Filled 2023-11-19 (×18): qty 1

## 2023-11-19 MED ORDER — FLUTICASONE PROPIONATE 50 MCG/ACT NA SUSP: 2.0000 | Freq: Every day | NASAL | Status: DC | PRN

## 2023-11-19 MED ORDER — SODIUM CHLORIDE 0.9 % IV SOLN
1.0000 g | Freq: Once | INTRAVENOUS | Status: AC
  Administered 2023-11-19: 1 g via INTRAVENOUS
  Filled 2023-11-19: qty 10

## 2023-11-19 MED ORDER — ONDANSETRON HCL 4 MG/2ML IJ SOLN: 4.0000 mg | Freq: Four times a day (QID) | INTRAMUSCULAR | Status: DC | PRN

## 2023-11-19 MED ORDER — SODIUM CHLORIDE 0.9 % IV SOLN
500.0000 mg | Freq: Once | INTRAVENOUS | Status: AC
  Administered 2023-11-19: 500 mg via INTRAVENOUS
  Filled 2023-11-19: qty 5

## 2023-11-19 NOTE — ED Triage Notes (Signed)
 EMS reports from home, SOB normally on 2 ltrs, Pt states her O2 concentrator has been broken for two weeks. On arrival 80 SpO2, placed on nonrebreather.  BP 164/82 HR 130 RR 30 Sp02 93 on 10ltrs NEB  22 LFA  5mg  Albuterol 0.5 Atrovent 125mg  Solumedrol Enroute

## 2023-11-19 NOTE — Progress Notes (Signed)
 Patient provided instruction with flutter valve.  Patient effort and understanding of device very minimal despite multiple attempts.  Will attempt instruction again with her next nebulizer treatment.  If she is still unable to properly perform flutter valve at that time then Respiratory Therapy may consider transitioning to CPT via the vest.       11/19/23 2100  Chest Physiotherapy Tx  CPT Delivery Source Flutter valve  $ Expiratory Vibration Device Administration  Initial  $ Flutter Blue Yes  CPT Duration  (10 breaths)  CPT Chest Site Full range  Post-Treatment Respirations 17  Cough Weak;Congested  Position Sitting  CPT Treatment Tolerance Tolerated well

## 2023-11-19 NOTE — ED Provider Notes (Signed)
 [facility] AT [facility] Provider Note   CSN: [id number] Arrival date & time: 11/19/23  1255     History  Chief Complaint  Patient presents with   Shortness of Breath    Sherri Hart is a 78 y.o. female.  HPI 78 yo female presents complaining of dyspnea.  She has oxygent at home but it has not been working, but is using a tank as needed.  Today, she states she has been having increasing sob for a week.  She has been sleeping in recliner.  She reports taking lasix as prescribed and does not think she has any additional fluid.  Denies cough, fever, chills, and does not think she is wheezing more than usual. Using oxygen tank at night.  No pain. She reports that she woke up today and told her friend that she did not feel well.  Then does not remember, but says she woke up and ambulance was there. RN note does not address. STates SOB, EMS from home.  Concentrator broken, sats 80% and placed on nrb.  Albuterol and solumedrom given. HR 130.  Sats 93% on 10 liters.       Home Medications Prior to Admission medications   Medication Sig Start Date End Date Taking? Authorizing Provider  acetaminophen (TYLENOL) 500 MG tablet Take 500-1,000 mg by mouth every 6 (six) hours as needed for mild pain or headache.    [provider]  albuterol (VENTOLIN HFA) 108 (90 Base) MCG/ACT inhaler Inhale 2 puffs into the lungs every 6 (six) hours as needed for wheezing or shortness of breath. 09/10/21   Andrey Campanile, MD  alendronate (FOSAMAX) 70 MG tablet Take 70 mg by mouth once a week. Take with a full glass of water on an empty stomach.    [provider]  BELSOMRA 10 MG TABS Take 1.5 tablets by mouth at bedtime as needed (For sleep). 01/04/23   [provider]  DILT-XR 180 MG 24 hr capsule Take 180 mg by mouth daily. 12/17/22   [provider]  ergocalciferol (VITAMIN D2) 1.25 MG (50000 UT) capsule Take 50,000 Units by mouth once a  week.    [provider]  fluticasone (FLONASE) 50 MCG/ACT nasal spray Place 2 sprays into both nostrils daily. 03/02/23 04/01/23  Briant Cedar, MD  furosemide (LASIX) 20 MG tablet Take 3 tablets (60 mg total) by mouth daily for 2 days. 04/25/23 04/27/23  Arabella Merles, PA-C  furosemide (LASIX) 40 MG tablet Take 1 tablet (40 mg total) by mouth daily. 02/04/21   Almon Hercules, MD  guaiFENesin (MUCINEX) 600 MG 12 hr tablet Take 1 tablet (600 mg total) by mouth 2 (two) times daily. Patient not taking: Reported on 06/03/2022 09/10/21   Andrey Campanile, MD  ipratropium-albuterol (DUONEB) 0.5-2.5 (3) MG/3ML SOLN Take 3 mLs by nebulization every 6 (six) hours as needed. 03/01/23   Briant Cedar, MD  loratadine (CLARITIN) 10 MG tablet Take 10 mg by mouth daily.    [provider]  nicotine (NICODERM CQ - DOSED IN MG/24 HOURS) 14 mg/24hr patch Place 1 patch (14 mg total) onto the skin daily. Patient not taking: Reported on 06/03/2022 09/11/21   Andrey Campanile, MD  pravastatin (PRAVACHOL) 20 MG tablet Take 20 mg by mouth at bedtime.    [provider]  rivaroxaban (XARELTO) 20 MG TABS tablet TAKE 1 TABLET(20 MG) BY MOUTH DAILY WITH SUPPER Patient taking differently: Take 20 mg by  mouth daily with supper. 11/01/22   Hilty, Lisette Abu, MD  SYMBICORT 80-4.5 MCG/ACT inhaler Inhale 2 puffs into the lungs in the morning and at bedtime. 01/30/23   [provider]  umeclidinium-vilanterol (ANORO ELLIPTA) 62.5-25 MCG/ACT AEPB Inhale 1 puff into the lungs daily. Patient not taking: Reported on 06/03/2022 09/10/21   Andrey Campanile, MD      Allergies    Aspirin and Penicillins    Review of Systems   Review of Systems  Physical Exam Updated Vital Signs BP (!) 147/108   Pulse (!) 102   Temp 97.9 F (36.6 C)   Resp 17   SpO2 92%  Physical Exam Vitals (sats 87% on 6 l/m) and nursing note reviewed.  Constitutional:      General: She is not in acute  distress.    Appearance: She is ill-appearing.  HENT:     Head: Normocephalic.  Eyes:     Pupils: Pupils are equal, round, and reactive to light.  Pulmonary:     Effort: Tachypnea and accessory muscle usage present.     Breath sounds: Examination of the right-middle field reveals rhonchi. Examination of the left-middle field reveals rhonchi. Examination of the right-lower field reveals rhonchi. Examination of the left-lower field reveals rhonchi. Decreased breath sounds and rhonchi present.  Abdominal:     Palpations: Abdomen is soft.  Musculoskeletal:        General: Normal range of motion.     Cervical back: Normal range of motion.     Right lower leg: No tenderness.     Left lower leg: No tenderness.  Skin:    General: Skin is warm and dry.     Capillary Refill: Capillary refill takes less than 2 seconds.  Neurological:     General: No focal deficit present.     Mental Status: She is alert.  Psychiatric:        Mood and Affect: Mood normal.     ED Results / Procedures / Treatments   Labs (all labs ordered are listed, but only abnormal results are displayed) Labs Reviewed  CBC - Abnormal; Notable for the following components:      Result Value   WBC 14.8 (*)    MCV 106.1 (*)    All other components within normal limits  COMPREHENSIVE METABOLIC PANEL - Abnormal; Notable for the following components:   Glucose, Bld 136 (*)    Calcium 8.2 (*)    All other components within normal limits  BLOOD GAS, ARTERIAL - Abnormal; Notable for the following components:   pH, Arterial 7.26 (*)    pCO2 arterial 66 (*)    pO2, Arterial 66 (*)    Bicarbonate 29.4 (*)    All other components within normal limits  I-STAT CG4 LACTIC ACID, ED - Abnormal; Notable for the following components:   Lactic Acid, Venous 0.4 (*)    All other components within normal limits  RESP PANEL BY RT-PCR (RSV, FLU A&B, COVID)  RVPGX2  BRAIN NATRIURETIC PEPTIDE  I-STAT CG4 LACTIC ACID, ED     EKG None  Radiology DG Chest Port 1 View Result Date: 11/19/2023 CLINICAL DATA:  Shortness of breath. EXAM: PORTABLE CHEST 1 VIEW COMPARISON:  Chest radiograph dated 04/24/2023. FINDINGS: Diffuse chronic interstitial coarsening. There are bibasilar atelectasis/scarring. Pneumonia is less likely. No consolidative changes. No pleural effusion or pneumothorax. Mild cardiomegaly. Atherosclerotic calcification of the aorta. No acute osseous pathology. IMPRESSION: Bibasilar atelectasis/scarring. Pneumonia is less likely. Electronically Signed  By: Elgie Collard M.D.   On: 11/19/2023 16:25    Procedures .Critical Care  Performed by: Margarita Grizzle, MD Authorized by: Margarita Grizzle, MD   Critical care provider statement:    Critical care time (minutes):  60   Critical care time was exclusive of:  Separately billable procedures and treating other patients   Critical care was necessary to treat or prevent imminent or life-threatening deterioration of the following conditions:  Respiratory failure   Critical care was time spent personally by me on the following activities:  Development of treatment plan with patient or surrogate, discussions with consultants, evaluation of patient's response to treatment, examination of patient, ordering and review of laboratory studies, ordering and review of radiographic studies, ordering and performing treatments and interventions, pulse oximetry, re-evaluation of patient's condition and review of old charts     Medications Ordered in ED Medications  cefTRIAXone (ROCEPHIN) 1 g in sodium chloride 0.9 % 100 mL IVPB (has no administration in time range)  azithromycin (ZITHROMAX) 500 mg in sodium chloride 0.9 % 250 mL IVPB (has no administration in time range)    ED Course/ Medical Decision Making/ A&P Clinical Course as of 11/19/23 1657  Wed Nov 19, 2023  1524 Chest x-Bertice Risse reviewed and significant for increased cardiac silhouette right greater than left  with no definite infiltrate noted.  Awaiting radiologist interpretation [DR]  1525 ABG on 6 L nasal cannula pH 7.26 pCO2 66 pO2 of 66 [DR]  1525 CBC reviewed interpreted as significant for white count of 14,800 otherwise within normal limits Respiratory panel reviewed interpreted negative for COVID flu or RSV Lactic acid is normal Complete metabolic panel is significant for mild hyperglycemia glucose 136 and calcium decreased 8.2 otherwise within normal limits [DR]  1639 Chest x-Yahsir Wickens read by radiologist as bibasilar atelectasis/scarring pneumonia less likely. [DR]    Clinical Course User Index [DR] Margarita Grizzle, MD                                 Medical Decision Making Amount and/or Complexity of Data Reviewed Labs: ordered. Radiology: ordered.  78 year old lady history of COPD presents today with increased dyspnea.  Initial oxygen saturations in the 80s.  Patient on 6 L nasal cannula and sats in 80s.  Patient received albuterol and Solu-Medrol.  Patient continues to have diffuse rhonchi. X-Faithlyn Recktenwald with new infiltrate right base to the hilar area.  This is new compared to her first prior.  Patient has some leukocytosis.  Based on symptoms and x-Sharell Hilmer will treat with Rocephin and Zithromax. She is mildly acidotic with some increased PaCO2 at 66. Patient has not tolerated being on 6 L nasal cannula with decreased sats and was increased per respiratory therapy to a simple mask.  Unclear exactly what oxygen concentration this is providing but her oxygen saturations have improved.  She does not appear to be volume overloaded on exam Lactic acid is normal so have low suspicion for sepsis No evidence of pneumothorax or other acute processes Care was discussed with Dr. Erenest Blank who will see for admission.       Final Clinical Impression(s) / ED Diagnoses Final diagnoses:  Acute on chronic respiratory failure with hypoxia and hypercapnia [facility])    Rx / DC Orders ED Discharge Orders     None          Margarita Grizzle, MD 11/19/23 1657

## 2023-11-19 NOTE — H&P (Signed)
 History and Physical  Sherri Hart UEA:[id number] DOB: [birth year] DOA: 11/19/2023  PCP: Ellyn Hack, MD   Chief Complaint: Shortness of breath  HPI: Sherri Hart is a 78 y.o. female with medical history significant for COPD on intermittent oxygen, paroxysmal atrial fibrillation on Xarelto and ongoing tobacco abuse being admitted to the hospital with acute hypoxic respiratory failure due to acute exacerbation of COPD.  Patient states that she was in her usual state of health until this morning when her breathing suddenly got worse.  She says that for the last week or so, her home concentrator has been broken and she has had trouble getting a hold of somebody from her DME company.  Regardless she was doing somewhat okay, she has been sleeping in a recliner, has been having gradual increased work of breathing for the last week or so.  No significant cough, no fevers, chills, chest pain.  However this morning, her breathing suddenly got worse, she felt like she could not catch her breath.  Denies any significant wheezing.  She called EMS, was given albuterol neb, Atrovent and IV Solu-Medrol en route.  Currently she is feeling much improved.  She was placed on high flow oxygen, currently saturating 91%.  Hospitalist was contacted for admission and further management of her acute hypoxic respiratory failure.  Review of Systems: Please see HPI for pertinent positives and negatives. A complete 10 system review of systems are otherwise negative.  Past Medical History:  Diagnosis Date   Anxiety    Arthritis    Atrial fibrillation (HCC)    Complication of anesthesia    used to get migraine headaches after surgery   Congestive heart failure (HCC)    COPD (chronic obstructive pulmonary disease) (HCC)    Dyspnea    if afib causes a problem   Pneumonia    Past Surgical History:  Procedure Laterality Date   BACK SURGERY     CHOLECYSTECTOMY     NECK SURGERY     REPLACEMENT TOTAL KNEE  Right    TOTAL KNEE REVISION Right 06/27/2021   Procedure: TOTAL KNEE REVISION;  Surgeon: Ollen Gross, MD;  Location: [facility];  Service: Orthopedics;  Laterality: Right;   TUBAL LIGATION     Social History:  reports that she has been smoking cigarettes. She has a 26.5 pack-year smoking history. She has never used smokeless tobacco. She reports that she does not drink alcohol and does not use drugs.  Allergies  Allergen Reactions   Aspirin Other (See Comments)    Stomach ache    Penicillins Swelling and Other (See Comments)    Swollen lips and welts + Pt stated that she can take keflex++  Did it involve swelling of the face/tongue/throat, SOB, or low BP? Yes Did it involve sudden or severe rash/hives, skin peeling, or any reaction on the inside of your mouth or nose? N Did you need to seek medical attention at a hospital or doctor's office? Y When did it last happen? childhood      If all above answers are "NO", may proceed with cephalosporin use.     Family History  Problem Relation Age of Onset   COPD Other      Prior to Admission medications   Medication Sig Start Date End Date Taking? Authorizing Provider  acetaminophen (TYLENOL) 500 MG tablet Take 500-1,000 mg by mouth every 6 (six) hours as needed for mild pain or headache.    [provider]  albuterol (VENTOLIN  HFA) 108 (90 Base) MCG/ACT inhaler Inhale 2 puffs into the lungs every 6 (six) hours as needed for wheezing or shortness of breath. 09/10/21   Andrey Campanile, MD  alendronate (FOSAMAX) 70 MG tablet Take 70 mg by mouth once a week. Take with a full glass of water on an empty stomach.    [provider]  BELSOMRA 10 MG TABS Take 1.5 tablets by mouth at bedtime as needed (For sleep). 01/04/23   [provider]  DILT-XR 180 MG 24 hr capsule Take 180 mg by mouth daily. 12/17/22   [provider]  ergocalciferol (VITAMIN D2) 1.25 MG (50000 UT) capsule Take 50,000 Units by mouth once a  week.    [provider]  fluticasone (FLONASE) 50 MCG/ACT nasal spray Place 2 sprays into both nostrils daily. 03/02/23 04/01/23  Briant Cedar, MD  furosemide (LASIX) 20 MG tablet Take 3 tablets (60 mg total) by mouth daily for 2 days. 04/25/23 04/27/23  Arabella Merles, PA-C  furosemide (LASIX) 40 MG tablet Take 1 tablet (40 mg total) by mouth daily. 02/04/21   Almon Hercules, MD  guaiFENesin (MUCINEX) 600 MG 12 hr tablet Take 1 tablet (600 mg total) by mouth 2 (two) times daily. Patient not taking: Reported on 06/03/2022 09/10/21   Andrey Campanile, MD  ipratropium-albuterol (DUONEB) 0.5-2.5 (3) MG/3ML SOLN Take 3 mLs by nebulization every 6 (six) hours as needed. 03/01/23   Briant Cedar, MD  loratadine (CLARITIN) 10 MG tablet Take 10 mg by mouth daily.    [provider]  nicotine (NICODERM CQ - DOSED IN MG/24 HOURS) 14 mg/24hr patch Place 1 patch (14 mg total) onto the skin daily. Patient not taking: Reported on 06/03/2022 09/11/21   Andrey Campanile, MD  pravastatin (PRAVACHOL) 20 MG tablet Take 20 mg by mouth at bedtime.    [provider]  rivaroxaban (XARELTO) 20 MG TABS tablet TAKE 1 TABLET(20 MG) BY MOUTH DAILY WITH SUPPER Patient taking differently: Take 20 mg by mouth daily with supper. 11/01/22   Hilty, Lisette Abu, MD  SYMBICORT 80-4.5 MCG/ACT inhaler Inhale 2 puffs into the lungs in the morning and at bedtime. 01/30/23   [provider]  umeclidinium-vilanterol (ANORO ELLIPTA) 62.5-25 MCG/ACT AEPB Inhale 1 puff into the lungs daily. Patient not taking: Reported on 06/03/2022 09/10/21   Andrey Campanile, MD    Physical Exam: BP (!) 147/108   Pulse (!) 102   Temp 97.9 F (36.6 C)   Resp 17   SpO2 92%  General:  Alert, oriented, calm, in no acute distress, wearing 10 L nasal cannula.  Able to speak in full sentences, no cough or evidence of respiratory distress. Cardiovascular: RRR, no murmurs or rubs, no peripheral edema   Respiratory: Breath sounds are very distant, diminished at the bilateral bases.  Otherwise clear, without crackles, wheezing, or rhonchi. Abdomen: soft, nontender, nondistended, normal bowel tones heard  Skin: dry, no rashes  Musculoskeletal: no joint effusions, normal range of motion  Psychiatric: appropriate affect, normal speech  Neurologic: extraocular muscles intact, clear speech, moving all extremities with intact sensorium         Labs on Admission:  Basic Metabolic Panel: Recent Labs  Lab 11/19/23 1347  NA 138  K 3.5  CL 107  CO2 22  GLUCOSE 136*  BUN 18  CREATININE 0.89  CALCIUM 8.2*   Liver Function Tests: Recent Labs  Lab 11/19/23 1347  AST 18  ALT 11  ALKPHOS  49  BILITOT 1.2  PROT 6.7  ALBUMIN 3.7   No results for input(s): "LIPASE", "AMYLASE" in the last 168 hours. No results for input(s): "AMMONIA" in the last 168 hours. CBC: Recent Labs  Lab 11/19/23 1347  WBC 14.8*  HGB 13.9  HCT 44.9  MCV 106.1*  PLT 176   Cardiac Enzymes: No results for input(s): "CKTOTAL", "CKMB", "CKMBINDEX", "TROPONINI" in the last 168 hours. BNP (last 3 results) Recent Labs    03/01/23 0703 04/24/23 2100  BNP 400.5* 165.6*    ProBNP (last 3 results) No results for input(s): "PROBNP" in the last 8760 hours.  CBG: No results for input(s): "GLUCAP" in the last 168 hours.  Radiological Exams on Admission: DG Chest Port 1 View Result Date: 11/19/2023 CLINICAL DATA:  Shortness of breath. EXAM: PORTABLE CHEST 1 VIEW COMPARISON:  Chest radiograph dated 04/24/2023. FINDINGS: Diffuse chronic interstitial coarsening. There are bibasilar atelectasis/scarring. Pneumonia is less likely. No consolidative changes. No pleural effusion or pneumothorax. Mild cardiomegaly. Atherosclerotic calcification of the aorta. No acute osseous pathology. IMPRESSION: Bibasilar atelectasis/scarring. Pneumonia is less likely. Electronically Signed   By: Elgie Collard M.D.   On: 11/19/2023 16:25    Assessment/Plan Sherri Hart is a 78 y.o. female with medical history significant for COPD on intermittent oxygen, paroxysmal atrial fibrillation on Xarelto and ongoing tobacco abuse being admitted to the hospital with acute hypoxic respiratory failure due to acute exacerbation of COPD.  Acute exacerbation of COPD-with hypoxia, dyspnea.  Significant improvement with IV steroids, breathing treatments. -Inpatient admission -Supplemental oxygen, titrate for goal O2 saturation greater than 89% -Incentive spirometer and flutter valve -Scheduled DuoNebs, with as needed albuterol -Received 125 mg Solu-Medrol with EMS, continue 40 mg IV Solu-Medrol every 12 hours -Empiric IV azithromycin to reduce inflammatory response, and for possible pneumonia  Acute hypoxic respiratory failure-likely due to acute exacerbation of COPD, treating as above -TOC consult to assist with replacement of her broken concentrator  Paroxysmal atrial fibrillation-continue home diltiazem and Xarelto  Heart failure with preserved EF-patient appears euvolemic, with no evidence of acute exacerbation -Continue home dose Lasix 40 mg p.o. daily  Hyperlipidemia-Pravachol    Code Status: Full Code  Consults called: None  Admission status: The appropriate patient status for this patient is INPATIENT. Inpatient status is judged to be reasonable and necessary in order to provide the required intensity of service to ensure the patient's safety. The patient's presenting symptoms, physical exam findings, and initial radiographic and laboratory data in the context of their chronic comorbidities is felt to place them at high risk for further clinical deterioration. Furthermore, it is not anticipated that the patient will be medically stable for discharge from the hospital within 2 midnights of admission.    I certify that at the point of admission it is my clinical judgment that the patient will require inpatient hospital care  spanning beyond 2 midnights from the point of admission due to high intensity of service, high risk for further deterioration and high frequency of surveillance required  Time spent: 55 minutes  Sherri Klett Sharlette Dense MD [facility] Pager [phone]  If 7PM-7AM, please contact night-coverage [email or web address] Password [facility]  11/19/2023, 4:58 PM

## 2023-11-20 DIAGNOSIS — J441 Chronic obstructive pulmonary disease with (acute) exacerbation: Secondary | ICD-10-CM | POA: Diagnosis not present

## 2023-11-20 LAB — BASIC METABOLIC PANEL
Anion gap: 11 (ref 5–15)
BUN: 21 mg/dL (ref 8–23)
CO2: 27 mmol/L (ref 22–32)
Calcium: 8.8 mg/dL — ABNORMAL LOW (ref 8.9–10.3)
Chloride: 102 mmol/L (ref 98–111)
Creatinine, Ser: 0.97 mg/dL (ref 0.44–1.00)
GFR, Estimated: >60 mL/min (ref >60)
Glucose, Bld: 193 mg/dL — ABNORMAL HIGH (ref 70–99)
Potassium: 4.4 mmol/L (ref 3.5–5.1)
Sodium: 140 mmol/L (ref 135–145)

## 2023-11-20 LAB — CBC
HCT: 46.1 % — ABNORMAL HIGH (ref 36.0–46.0)
Hemoglobin: 14 g/dL (ref 12.0–15.0)
MCH: 32.2 pg (ref 26.0–34.0)
MCHC: 30.4 g/dL (ref 30.0–36.0)
MCV: 106 fL — ABNORMAL HIGH (ref 80.0–100.0)
Platelets: 184 10*3/uL (ref 150–400)
RBC: 4.35 MIL/uL (ref 3.87–5.11)
RDW: 14.3 % (ref 11.5–15.5)
WBC: 21.3 10*3/uL — ABNORMAL HIGH (ref 4.0–10.5)
nRBC: 0 % (ref 0.0–0.2)

## 2023-11-20 LAB — BRAIN NATRIURETIC PEPTIDE: B Natriuretic Peptide: 448.7 pg/mL — ABNORMAL HIGH (ref 0.0–100.0)

## 2023-11-20 MED ORDER — INFLUENZA VAC A&B SURF ANT ADJ 0.5 ML IM SUSY
0.5000 mL | PREFILLED_SYRINGE | INTRAMUSCULAR | Status: DC
  Filled 2023-11-20: qty 0.5

## 2023-11-20 MED ORDER — OXYCODONE-ACETAMINOPHEN 5-325 MG PO TABS
2.0000 | ORAL_TABLET | ORAL | Status: DC | PRN
  Administered 2023-11-20 – 2023-11-28 (×25): 2 via ORAL
  Filled 2023-11-20 (×26): qty 2

## 2023-11-20 MED ORDER — DIPHENHYDRAMINE HCL 25 MG PO CAPS
25.0000 mg | ORAL_CAPSULE | Freq: Four times a day (QID) | ORAL | Status: AC | PRN
Start: 2023-11-20 — End: ?

## 2023-11-20 NOTE — TOC Initial Note (Signed)
 Transition of Care [facility]) - Initial/Assessment Note    Patient Details  Name: Sherri Hart MRN: [id number] Date of Birth: Nov 24, 1945  [facility]) CM/SW Contact:    Larrie Kass, LCSW Phone Number: 11/20/2023, 3:45 PM  Clinical Narrative:                 Received consult regarding the pt's oxygen concentrator. CSW met with the pt at bedside. She reported that her concentrator stopped working properly about two weeks ago. She stated she tried to call, but no one has followed up with her. Pt reports that her granddaughter will pick her up upon discharge. CSW reached out to [facility] with [employer], who stated they will submit a service request for the pt's concentrator. The pt will also need a portable oxygen tank in her room prior to d/c.  Expected Discharge Plan: Home/Self Care Barriers to Discharge: Continued Medical Work up   Patient Goals and CMS Choice Patient states their goals for this hospitalization and ongoing recovery are:: return home          Expected Discharge Plan and Services       Living arrangements for the past 2 months: Apartment                                      Prior Living Arrangements/Services Living arrangements for the past 2 months: Apartment Lives with:: Self Patient language and need for interpreter reviewed:: Yes Do you feel safe going back to the place where you live?: Yes      Need for Family Participation in Patient Care: No (Comment) Care giver support system in place?: No (comment)   Criminal Activity/Legal Involvement Pertinent to Current Situation/Hospitalization: No - Comment as needed  Activities of Daily Living   ADL Screening (condition at time of admission) Independently performs ADLs?: Yes (appropriate for developmental age) Is the patient deaf or have difficulty hearing?: Yes Does the patient have difficulty seeing, even when wearing glasses/contacts?: No Does the patient have difficulty  concentrating, remembering, or making decisions?: No  Permission Sought/Granted                  Emotional Assessment Appearance:: Appears stated age Attitude/Demeanor/Rapport: Gracious Affect (typically observed): Accepting Orientation: : Oriented to  Time, Oriented to Place, Oriented to Self, Oriented to Situation   Psych Involvement: No (comment)  Admission diagnosis:  COPD with acute exacerbation (HCC) [J44.1] Acute on chronic respiratory failure with hypoxia and hypercapnia (HCC) [J47.82, J96.22] Patient Active Problem List   Diagnosis Date Noted   CAP (community acquired pneumonia) 02/26/2023   COPD exacerbation (HCC) 09/07/2021   Pressure injury of skin 07/01/2021   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   Status post revision of total knee replacement, 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   Atrial fibrillation with RVR (HCC) 02/01/2021   Acute diastolic (congestive) heart failure (HCC) 07/05/2020   Elevated troponin level not due myocardial infarction 07/04/2020   Hypomagnesemia 07/04/2020   Atrial fibrillation with rapid ventricular response (HCC) 07/04/2020   Acute cardiogenic pulmonary edema (HCC) 07/04/2020   COPD with acute exacerbation (HCC) 07/04/2020   Nicotine dependence, cigarettes, uncomplicated 07/04/2020   PCP:  Ellyn Hack, MD Pharmacy:   [id number] [place], [place] - [address] AT [facility] [address] & [facility] [address] [place] [address] Phone: [phone] Fax: [phone]  Social Drivers of Health (SDOH) Social History: SDOH Screenings   Food Insecurity: No Food Insecurity (11/20/2023)  Housing: Low Risk  (11/20/2023)  Transportation Needs: No Transportation Needs (11/20/2023)  Utilities: Not At Risk (11/20/2023)  Social Connections: Socially Isolated (11/20/2023)  Tobacco Use: High Risk (11/19/2023)   SDOH Interventions:     Readmission Risk Interventions     No data to display

## 2023-11-20 NOTE — ED Notes (Signed)
 Pt spilled coffee on herself, per Diplomatic Services operational officer on talkie. This NA asked pt if she has burned herself to which pt replied "no, I did not burn myself." This NA cleaned up pt and changed her into a clean gown with a warm blanket provided.

## 2023-11-20 NOTE — Progress Notes (Signed)
 PROGRESS NOTE    Sherri Hart  EAV:[id number] DOB: [birth year] DOA: 11/19/2023 PCP: Ellyn Hack, MD   Brief Narrative: This 78 years old female with PMH significant for COPD on intermittent oxygen, paroxysmal A-fib on Xarelto and ongoing tobacco abuse presented to the ED with worsening shortness of breath. Patient reports her home oxygen concentrator has been broken and she had  hard time getting a hold of somebody from the DME company.  Regardless she was doing somewhat okay, she has been sleeping in recliner,  has been having gradually increased work of breathing for last 1 week so.  This morning her breathing suddenly got worse and felt like she could not catch her breath so she came to the ED.  Patient is admitted for acute hypoxic respiratory failure secondary to COPD exacerbation.  Assessment & Plan:   Principal Problem:   COPD with acute exacerbation (HCC)  Acute on chronic hypoxic respiratory failure: Acute COPD exacerbation: Patient uses oxygen intermittently at home. Her home O2 concentrator has been broken for some time.   Patient has been having gradual worsening of shortness of breath. She was significantly hypoxic requiring high flow oxygen on arrival in the ED. Patient was also given Solu-Medrol, Nebulized bronchodilators and magnesium. Patient reports significant improvement with IV steroids and breathing treatment. Continue supplemental oxygen and wean as tolerated. Still remains on 10L HFNC Incentive spirometer and flutter valve Continue scheduled DuoNeb as well  as needed albuterol. Continue IV Solu-Medrol 40 mg every 12 hours. Empirically started on Zithromax to reduce inflammatory response and for possible pneumonia. Patient needs TOC to assist with replacement of her broken concentrator.  Paroxysmal A-fib: Heart rate is well-controlled,  Continue Cardizem and Xarelto.  Heart failure with preserved EF: Patient appears euvolemic, no evidence of acute  exacerbation. Continue Lasix 40 mg p.o. daily  Hyperlipidemia Continue Pravachol.  Leucocytosis: Likely secondary to steroids.   DVT prophylaxis: Lovenox Code Status: Full code Family Communication: No family at bedside Disposition Plan:  Status is: Inpatient Remains inpatient appropriate because: Admitted for acute on chronic hypoxic respiratory failure due to COPD exacerbation.    Consultants:  None  Procedures: None  Antimicrobials:  Anti-infectives (From admission, onward)    Start     Dose/Rate Route Frequency Ordered Stop   11/20/23 1700  azithromycin (ZITHROMAX) 500 mg in sodium chloride 0.9 % 250 mL IVPB        500 mg 250 mL/hr over 60 Minutes Intravenous Every 24 hours 11/19/23 1658 11/24/23 1659   11/19/23 1645  cefTRIAXone (ROCEPHIN) 1 g in sodium chloride 0.9 % 100 mL IVPB        1 g 200 mL/hr over 30 Minutes Intravenous  Once 11/19/23 1640 11/19/23 1836   11/19/23 1645  azithromycin (ZITHROMAX) 500 mg in sodium chloride 0.9 % 250 mL IVPB        500 mg 250 mL/hr over 60 Minutes Intravenous  Once 11/19/23 1640 11/19/23 1836       Subjective: Patient was seen and examined at bedside. Overnight events noted.   Patient reports feeling better but still requiring high flow nasal cannula oxygen.  Objective: Vitals:   11/20/23 0453 11/20/23 0825 11/20/23 1022 11/20/23 1100  BP:   (!) 126/99   Pulse:  84    Resp:  20    Temp:      TempSrc:      SpO2:  92% 96% (!) 86%  Weight: 121.9 kg     Height: 5\' 6"  (  1.676 m)       Intake/Output Summary (Last 24 hours) at 11/20/2023 1159 Last data filed at 11/20/2023 0500 Gross per 24 hour  Intake 240 ml  Output --  Net 240 ml   Filed Weights   11/20/23 0453  Weight: 121.9 kg    Examination:  General exam: Appears calm and comfortable, not in any acute distress. Respiratory system: Clear to auscultation. Respiratory effort normal.  RR 15 Cardiovascular system: S1 & S2 heard, RRR. No JVD, murmurs, rubs,  gallops or clicks.  Gastrointestinal system: Abdomen is non distended, soft and non tender. Normal bowel sounds heard. Central nervous system: Alert and oriented x 3. No focal neurological deficits. Extremities: No edema, no cyanosis, no clubbing Skin: No rashes, lesions or ulcers Psychiatry: Judgement and insight appear normal. Mood & affect appropriate.     Data Reviewed: I have personally reviewed following labs and imaging studies  CBC: Recent Labs  Lab 11/19/23 1347 11/20/23 0440  WBC 14.8* 21.3*  HGB 13.9 14.0  HCT 44.9 46.1*  MCV 106.1* 106.0*  PLT 176 184   Basic Metabolic Panel: Recent Labs  Lab 11/19/23 1347 11/20/23 0440  NA 138 140  K 3.5 4.4  CL 107 102  CO2 22 27  GLUCOSE 136* 193*  BUN 18 21  CREATININE 0.89 0.97  CALCIUM 8.2* 8.8*   GFR: Estimated Creatinine Clearance: 66.7 mL/min (by C-G formula based on SCr of 0.97 mg/dL). Liver Function Tests: Recent Labs  Lab 11/19/23 1347  AST 18  ALT 11  ALKPHOS 49  BILITOT 1.2  PROT 6.7  ALBUMIN 3.7   No results for input(s): "LIPASE", "AMYLASE" in the last 168 hours. No results for input(s): "AMMONIA" in the last 168 hours. Coagulation Profile: No results for input(s): "INR", "PROTIME" in the last 168 hours. Cardiac Enzymes: No results for input(s): "CKTOTAL", "CKMB", "CKMBINDEX", "TROPONINI" in the last 168 hours. BNP (last 3 results) No results for input(s): "PROBNP" in the last 8760 hours. HbA1C: No results for input(s): "HGBA1C" in the last 72 hours. CBG: No results for input(s): "GLUCAP" in the last 168 hours. Lipid Profile: No results for input(s): "CHOL", "HDL", "LDLCALC", "TRIG", "CHOLHDL", "LDLDIRECT" in the last 72 hours. Thyroid Function Tests: No results for input(s): "TSH", "T4TOTAL", "FREET4", "T3FREE", "THYROIDAB" in the last 72 hours. Anemia Panel: No results for input(s): "VITAMINB12", "FOLATE", "FERRITIN", "TIBC", "IRON", "RETICCTPCT" in the last 72 hours. Sepsis  Labs: Recent Labs  Lab 11/19/23 1353  LATICACIDVEN 0.4*    Recent Results (from the past 240 hours)  Resp panel by RT-PCR (RSV, Flu A&B, Covid) Anterior Nasal Swab     Status: None   Collection Time: 11/19/23  1:50 PM   Specimen: Anterior Nasal Swab  Result Value Ref Range Status   SARS Coronavirus 2 by RT PCR NEGATIVE NEGATIVE Final    Comment: (NOTE) SARS-CoV-2 target nucleic acids are NOT DETECTED.  The SARS-CoV-2 RNA is generally detectable in upper respiratory specimens during the acute phase of infection. The lowest concentration of SARS-CoV-2 viral copies this assay can detect is 138 copies/mL. A negative result does not preclude SARS-Cov-2 infection and should not be used as the sole basis for treatment or other patient management decisions. A negative result may occur with  improper specimen collection/handling, submission of specimen other than nasopharyngeal swab, presence of viral mutation(s) within the areas targeted by this assay, and inadequate number of viral copies(<138 copies/mL). A negative result must be combined with clinical observations, patient history, and epidemiological information.  The expected result is Negative.  Fact Sheet for Patients:  BloggerCourse.com  Fact Sheet for Healthcare Providers:  SeriousBroker.it  This test is no t yet approved or cleared by the [place] FDA and  has been authorized for detection and/or diagnosis of SARS-CoV-2 by FDA under an Emergency Use Authorization (EUA). This EUA will remain  in effect (meaning this test can be used) for the duration of the COVID-19 declaration under Section 564(b)(1) of the Act, 21 U.S.C.section 360bbb-3(b)(1), unless the authorization is terminated  or revoked sooner.       Influenza A by PCR NEGATIVE NEGATIVE Final   Influenza B by PCR NEGATIVE NEGATIVE Final    Comment: (NOTE) The Xpert Xpress SARS-CoV-2/FLU/RSV plus assay is  intended as an aid in the diagnosis of influenza from Nasopharyngeal swab specimens and should not be used as a sole basis for treatment. Nasal washings and aspirates are unacceptable for Xpert Xpress SARS-CoV-2/FLU/RSV testing.  Fact Sheet for Patients: BloggerCourse.com  Fact Sheet for Healthcare Providers: SeriousBroker.it  This test is not yet approved or cleared by the [place] FDA and has been authorized for detection and/or diagnosis of SARS-CoV-2 by FDA under an Emergency Use Authorization (EUA). This EUA will remain in effect (meaning this test can be used) for the duration of the COVID-19 declaration under Section 564(b)(1) of the Act, 21 U.S.C. section 360bbb-3(b)(1), unless the authorization is terminated or revoked.     Resp Syncytial Virus by PCR NEGATIVE NEGATIVE Final    Comment: (NOTE) Fact Sheet for Patients: BloggerCourse.com  Fact Sheet for Healthcare Providers: SeriousBroker.it  This test is not yet approved or cleared by the [place] FDA and has been authorized for detection and/or diagnosis of SARS-CoV-2 by FDA under an Emergency Use Authorization (EUA). This EUA will remain in effect (meaning this test can be used) for the duration of the COVID-19 declaration under Section 564(b)(1) of the Act, 21 U.S.C. section 360bbb-3(b)(1), unless the authorization is terminated or revoked.  Performed at [facility], [address] [place], [place] [address]     Radiology Studies: [facility] Chest Port 1 View Result Date: 11/19/2023 CLINICAL DATA:  Shortness of breath. EXAM: PORTABLE CHEST 1 VIEW COMPARISON:  Chest radiograph dated 04/24/2023. FINDINGS: Diffuse chronic interstitial coarsening. There are bibasilar atelectasis/scarring. Pneumonia is less likely. No consolidative changes. No pleural effusion or pneumothorax. Mild cardiomegaly.  Atherosclerotic calcification of the aorta. No acute osseous pathology. IMPRESSION: Bibasilar atelectasis/scarring. Pneumonia is less likely. Electronically Signed   By: Elgie Collard M.D.   On: 11/19/2023 16:25   Scheduled Meds:  diltiazem  180 mg Oral QHS   furosemide  40 mg Oral Daily   guaiFENesin  600 mg Oral BID   [START ON 11/21/2023] influenza vaccine adjuvanted  0.5 mL Intramuscular Tomorrow-1000   ipratropium-albuterol  3 mL Nebulization QID   loratadine  10 mg Oral Daily   methylPREDNISolone (SOLU-MEDROL) injection  40 mg Intravenous Q12H   pravastatin  20 mg Oral QHS   rivaroxaban  20 mg Oral Q supper   Continuous Infusions:  azithromycin       LOS: 1 day    Time spent: 50 mins    Willeen Niece, MD [facility]   If 7PM-7AM, please contact night-coverage

## 2023-11-20 NOTE — Consult Note (Signed)
   NAME:  Sherri Hart, MRN:  [id number], DOB:  [birth year], LOS: 1 ADMISSION DATE:  11/19/2023, CONSULTATION DATE: 11/20/2023 REFERRING MD: Dr. Idelle Leech, CHIEF COMPLAINT: COPD exacerbation  History of Present Illness:  Asked to see patient for shortness of breath  Was brought in with altered mental status Friend activated EMS  Oxygen concentrator had not been working well for the last couple of weeks She has chronic back pain, has not been using any medications, has been hurting more the last week to 2 weeks-was on some opiates previously while she was living in [place] Has not been around anybody with a febrile illness  Has been sleeping more in the recliner because of low back pain, slight increase in work of breathing over the last week  Active smoker less than a pack a day  History of atrial fibrillation, compliant with Xarelto  Pertinent  Medical History  .pmh yeah hi does have to bit about not a drinker longer thank you  Significant Hospital Events: Including procedures, antibiotic start and stop dates in addition to other pertinent events   CT scan 06/03/2022-some pulmonary congestion, emphysema Chest x-ray 11/19/2023-some atelectasis  Interim History / Subjective:   Shortness of breath, back pain and discomfort Feels a little bit better today Still with significantly higher oxygen requirement than usual  Objective   Blood pressure 114/64, pulse 89, temperature 98.3 F (36.8 C), temperature source Oral, resp. rate 16, height 5\' 6"  (1.676 m), weight 121.9 kg, SpO2 93%.        Intake/Output Summary (Last 24 hours) at 11/20/2023 1346 Last data filed at 11/20/2023 0500 Gross per 24 hour  Intake 240 ml  Output --  Net 240 ml   Filed Weights   11/20/23 0453  Weight: 121.9 kg    Examination: General: Elderly lady, does not appear to be in distress HENT: Moist oral mucosa Lungs: Decreased air entry bilaterally, no wheezes, no rhonchi Cardiovascular: S1-S2  appreciated Abdomen: Soft, bowel sounds appreciated Extremities: No peripheral edema Neuro: Alert and oriented x 3 GU:   Resolved Hospital Problem list     Assessment & Plan:  Acute exacerbation of chronic obstructive pulmonary disease -Unknown stage of COPD -Had PFT a while back -Active smoker -Emphysema on previous CT  -Bronchodilators, steroids, short course of antibiotics for COPD exacerbation/possible pneumonia -Agree with antibiotic therapy -Bibasal infiltrate on chest x-ray -Possible pneumonia  -Wean oxygen as tolerated  Last echocardiogram 03/01/2023 did show severely dilated right atrium -Chest x-ray suggestive of right cardiac enlargement  -Will be helpful to get a new echocardiogram to assess right-sided cardiac function -May be limited by body habitus  Usually compliant with Xarelto -Less likely dealing with thromboembolic disease  Chronic back pain -Start on Percocet -May need more optimization  Needs assistance with oxygen supply at home prior to discharge  Smoking cessation counseling  May need a nicotine patch  Virl Diamond, MD [place] [facility] Pager: See Loretha Stapler

## 2023-11-20 NOTE — Progress Notes (Signed)
   11/20/23 1100  Oxygen Therapy  SpO2 (!) 86 %  O2 Device HFNC  O2 Flow Rate (L/min) 13 L/min   Desaturating oxygen increased to 13L HFNC

## 2023-11-21 ENCOUNTER — Inpatient Hospital Stay (HOSPITAL_COMMUNITY)

## 2023-11-21 DIAGNOSIS — J9621 Acute and chronic respiratory failure with hypoxia: Secondary | ICD-10-CM | POA: Diagnosis not present

## 2023-11-21 DIAGNOSIS — J449 Chronic obstructive pulmonary disease, unspecified: Secondary | ICD-10-CM | POA: Diagnosis not present

## 2023-11-21 DIAGNOSIS — I4891 Unspecified atrial fibrillation: Secondary | ICD-10-CM | POA: Diagnosis not present

## 2023-11-21 DIAGNOSIS — J441 Chronic obstructive pulmonary disease with (acute) exacerbation: Secondary | ICD-10-CM | POA: Diagnosis not present

## 2023-11-21 DIAGNOSIS — R0609 Other forms of dyspnea: Secondary | ICD-10-CM

## 2023-11-21 LAB — ECHOCARDIOGRAM COMPLETE
AR max vel: 3.14 cm2
AV Area VTI: 2.91 cm2
AV Area mean vel: 2.61 cm2
AV Mean grad: 2 mmHg
AV Peak grad: 4.1 mmHg
Ao pk vel: 1.01 m/s
Area-P 1/2: 3.93 cm2
Height: 66 in
MV VTI: 2.87 cm2
S' Lateral: 3.7 cm
Weight: 4299.85 [oz_av]

## 2023-11-21 LAB — PROCALCITONIN: Procalcitonin: 0.55 ng/mL

## 2023-11-21 MED ORDER — REVEFENACIN 175 MCG/3ML IN SOLN
175.0000 ug | Freq: Every day | RESPIRATORY_TRACT | Status: DC
  Administered 2023-11-22 – 2023-11-28 (×7): 175 ug via RESPIRATORY_TRACT
  Filled 2023-11-21 (×6): qty 3

## 2023-11-21 MED ORDER — ARFORMOTEROL TARTRATE 15 MCG/2ML IN NEBU
15.0000 ug | INHALATION_SOLUTION | Freq: Two times a day (BID) | RESPIRATORY_TRACT | Status: DC
  Administered 2023-11-21 – 2023-11-28 (×14): 15 ug via RESPIRATORY_TRACT
  Filled 2023-11-21 (×14): qty 2

## 2023-11-21 MED ORDER — ALPRAZOLAM 0.25 MG PO TABS
0.2500 mg | ORAL_TABLET | Freq: Once | ORAL | Status: AC
  Administered 2023-11-21: 0.25 mg via ORAL
  Filled 2023-11-21: qty 1

## 2023-11-21 MED ORDER — NICOTINE 21 MG/24HR TD PT24
21.0000 mg | MEDICATED_PATCH | Freq: Every day | TRANSDERMAL | Status: DC
  Administered 2023-11-21 – 2023-11-28 (×8): 21 mg via TRANSDERMAL
  Filled 2023-11-21 (×8): qty 1

## 2023-11-21 MED ORDER — BUDESONIDE 0.5 MG/2ML IN SUSP
0.5000 mg | Freq: Two times a day (BID) | RESPIRATORY_TRACT | Status: DC
  Administered 2023-11-21 – 2023-11-28 (×14): 0.5 mg via RESPIRATORY_TRACT
  Filled 2023-11-21 (×14): qty 2

## 2023-11-21 NOTE — Progress Notes (Signed)
   NAME:  Sherri Hart, MRN:  [id number], DOB:  09-09-1946, LOS: 2 ADMISSION DATE:  11/19/2023, CONSULTATION DATE: 11/20/2023 REFERRING MD: Dr. Idelle Leech, CHIEF COMPLAINT: COPD exacerbation  History of Present Illness:  Asked to see patient for shortness of breath  Was brought in with altered mental status Friend activated EMS  Oxygen concentrator had not been working well for the last couple of weeks She has chronic back pain, has not been using any medications, has been hurting more the last week to 2 weeks-was on some opiates previously while she was living in [place] Has not been around anybody with a febrile illness  Has been sleeping more in the recliner because of low back pain, slight increase in work of breathing over the last week  Active smoker less than a pack a day  History of atrial fibrillation, compliant with Xarelto  Pertinent  Medical History   Tobacco use  COPD Afib on xarelto Chronic hypoxia   Significant Hospital Events: Including procedures, antibiotic start and stop dates in addition to other pertinent events   CT scan 06/03/2022-some pulmonary congestion, emphysema Chest x-ray 11/19/2023-some atelectasis  Interim History / Subjective:   8L 88-90 %  She was surprised she didn't go home yesterday   Says she feels good breathing feels good Surprised to learn she is on 8L   Objective   Blood pressure 136/72, pulse 86, temperature 98.7 F (37.1 C), temperature source Oral, resp. rate 16, height 5\' 6"  (1.676 m), weight 121.9 kg, SpO2 91%.        Intake/Output Summary (Last 24 hours) at 11/21/2023 1418 Last data filed at 11/21/2023 1000 Gross per 24 hour  Intake 490 ml  Output 600 ml  Net -110 ml   Filed Weights   11/20/23 0453  Weight: 121.9 kg    Examination: General: Elderly chronically ill F NAD  HENT: Poor dentition, NCAT.  Lungs: Bilateral upper lobe wheezes, bases are diminished, even and unlabored on 8L  Cardiovascular: s1s2 cap  refill is brisk  Abdomen: soft round abd  Extremities: No pitting edema. Some flank edema  Neuro: AAOx3  GU: defer   Resolved Hospital Problem list     Assessment & Plan:   AECOPD AoC hypoxic respiratory failure  Tobacco use  - anoro ellipta + duoneb at home -bibasilar opacities on CXR, favored to be atelectasis or scarring  P -pretty wheezy 3/14-- will add triple therapy, has been getting [facility] duoneb + PRN albuterol  -BID solumedrol  -repeat CXR 3/14, check PCT and rvp  -- does c/o some URI sx  -cont flutter, IS  -will need walking sats prior to dc  -appreciate TOC working on navigating home O2 situation -smoking cessation counseling   Afib on xarelto  Concern for RV dysfunction -cont xarelto -echo is scheduled 3/14  -is getting lasix; room to escalate if needed    CCT na    Tessie Fass MSN, AGACNP-BC [facility] Pulmonary/Critical Care Medicine Amion for pager  11/21/2023, 2:18 PM

## 2023-11-21 NOTE — Plan of Care (Signed)

## 2023-11-21 NOTE — TOC Progression Note (Signed)
 Transition of Care [facility]) - Progression Note    Patient Details  Name: Sherri Hart MRN: [id number] Date of Birth: 02-20-1946  [facility] CM/SW Contact  Larrie Kass, LCSW Phone Number: 11/21/2023, 10:43 AM  Clinical Narrative:     CSW received a call from [place] with [employer]. He reported that the pt has not been active with the company for home oxygen services since 2022. He stated that the pt would need to become a new customer in order for [employer] to provide services. Additionally, he mentioned that the pt currently has an outstanding balance that must be paid off before she can receive home oxygen.CSW provided the pt with contact information to set up a payment plan.  CSW spoke with the pt's granddaughter, who reported that she will contact the DME company regarding payment. Pt will need new home oxygen orders and walking saturation levels to qualify for home oxygen. TOC to follow.       Expected Discharge Plan: Home/Self Care Barriers to Discharge: Continued Medical Work up  Expected Discharge Plan and Services       Living arrangements for the past 2 months: Apartment                                       Social Determinants of Health (SDOH) Interventions SDOH Screenings   Food Insecurity: No Food Insecurity (11/20/2023)  Housing: Low Risk  (11/20/2023)  Transportation Needs: No Transportation Needs (11/20/2023)  Utilities: Not At Risk (11/20/2023)  Social Connections: Socially Isolated (11/20/2023)  Tobacco Use: High Risk (11/19/2023)    Readmission Risk Interventions     No data to display

## 2023-11-21 NOTE — Progress Notes (Signed)
 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  She was requiring 10 L HFNC and was treated with steroids and inhaled bronchodilators.  Subjective:  Patient states she like to go home.  Notes she already has oxygen at home.  Patient's granddaughter is worried that she is on high flow [place] and does not think her grandmother is back to baseline.  I discussed significance of HFNC and need to stay in house while we monitor her, patient is agreeable to stay longer.   Objective: Vitals:   11/21/23 0929 11/21/23 1225 11/21/23 1257 11/21/23 1531  BP:   136/72   Pulse:   86   Resp:   16   Temp:   98.7 F (37.1 C)   TempSrc:   Oral   SpO2: 91% 90% 91% 91%  Weight:      Height:        Intake/Output Summary (Last 24 hours) at 11/21/2023 1633 Last data filed at 11/21/2023 1000 Gross per 24 hour  Intake 490 ml  Output 600 ml  Net -110 ml   Filed Weights   11/20/23 0453  Weight: 121.9 kg     Exam:  General: Patient with poor dentition sitting up in bed in NAD, speaking comfortably with nasal cannula in place Eyes: sclera anicteric, conjuctiva mild injection bilaterally, poor dentition CVS: S1-S2, regular  Respiratory: Rhonchorous breath sounds, also transmitted upper respiratory sounds, no wheezing GI: NABS, soft, NT  LE: Warm and well-perfused Neuro: A/O x 3,  grossly nonfocal.  Psych: patient is logical and coherent, judgement and insight appear normal, mood and affect appropriate to situation.  Data Reviewed:  Basic Metabolic Panel: Recent Labs  Lab 11/19/23 1347 11/20/23 0440  NA 138 140  K 3.5 4.4  CL 107 102  CO2 22 27  GLUCOSE 136* 193*  BUN 18 21  CREATININE 0.89 0.97  CALCIUM  8.2* 8.8*    CBC: Recent Labs  Lab 11/19/23 1347 11/20/23 0440  WBC 14.8* 21.3*  HGB 13.9 14.0  HCT 44.9 46.1*  MCV 106.1* 106.0*  PLT 176 184     Scheduled Meds:  arformoterol  15 mcg Nebulization BID   budesonide (PULMICORT) nebulizer solution  0.5 mg Nebulization BID   diltiazem  180 mg Oral QHS   furosemide  40 mg Oral Daily   guaiFENesin  600 mg Oral BID   influenza vaccine adjuvanted  0.5 mL Intramuscular Tomorrow-1000   ipratropium-albuterol  3 mL Nebulization QID   loratadine  10 mg Oral Daily   methylPREDNISolone (SOLU-MEDROL) injection  40 mg Intravenous Q12H   nicotine  21 mg Transdermal Daily   pravastatin  20 mg Oral QHS   revefenacin  175 mcg Nebulization Daily   rivaroxaban  20 mg Oral Q supper   Continuous Infusions:  azithromycin Stopped (11/20/23 1741)     Assessment & Plan:   Acute exacerbation of COPD Acute on chronic hypoxic respiratory failure Patient has been weaned down to 8 L HFNC down from 10 yesterday Continue steroids and inhaled bronchodilators Patient appears relatively comfortable with no increased work of breathing on my exam Empirically started on Zithromax which I will continue  PAF Rate is well-controlled on Cardizem Patient on  Xarelto for secondary prevention of stroke  HFpEF BNP is 448, patient with small bilateral pleural effusions Echocardiogram is ordered and pending Continue home doses of Lasix 40 daily, may need to increase    DVT prophylaxis: Xarelto Code Status: Full Family Communication: Patient's granddaughter was at bedside and had spent the night     Studies: ECHOCARDIOGRAM COMPLETE Result Date: 11/21/2023    ECHOCARDIOGRAM REPORT   Patient Name:   Sherri Hart Date of Exam: 11/21/2023 Medical Rec #:  [id number]          Height:       66.0 in Accession #:    [id number]         Weight:       268.7 lb Date of Birth:  [birth year]           BSA:          2.267 m Patient Age:    77 years           BP:            136/72 mmHg Patient Gender: F                  HR:           95 bpm. Exam Location:  Inpatient Procedure: 2D Echo, Cardiac Doppler and Color Doppler (Both Spectral and Color            Flow Doppler were utilized during procedure). Indications:    Dyspnea  History:        Patient has prior history of Echocardiogram examinations, most                 recent 11/29/2022. CHF, COPD, Arrythmias:Atrial Fibrillation;                 Risk Factors:Current Smoker.  Sonographer:    Amy Chionchio Referring Phys: [id number] ADEWALE A OLALERE IMPRESSIONS  1. Left ventricular ejection fraction, by estimation, is 60 to 65%. The left ventricle has normal function. The left ventricle has no regional wall motion abnormalities. There is mild concentric left ventricular hypertrophy. Left ventricular diastolic parameters are indeterminate.  2. Right ventricular systolic function is mildly reduced. The right ventricular size is mildly enlarged. There is moderately elevated pulmonary artery systolic pressure. The estimated right ventricular systolic pressure is 47.3 mmHg.  3. Left atrial size was severely dilated.  4. Right atrial size was severely dilated.  5. The mitral valve is normal in structure. Trivial mitral valve regurgitation. No evidence of mitral stenosis.  6. The aortic valve is tricuspid. There is mild calcification of the aortic valve. Aortic valve regurgitation is not visualized. Aortic valve sclerosis/calcification is present, without any evidence of aortic stenosis.  7. The inferior vena cava is dilated in size with <50% respiratory variability, suggesting right atrial pressure of 15 mmHg. FINDINGS  Left Ventricle: Left ventricular ejection fraction, by estimation, is 60 to 65%. The left ventricle has normal function. The left ventricle has no regional wall motion abnormalities. The left ventricular internal cavity size was normal in size. There is  mild concentric left ventricular hypertrophy. Left ventricular diastolic  parameters are indeterminate. Right Ventricle: The right ventricular size is mildly enlarged. No increase in right ventricular wall thickness. Right ventricular systolic function is mildly reduced. There is moderately elevated pulmonary artery systolic pressure. The tricuspid regurgitant velocity is 2.84 m/s, and with an assumed right atrial pressure of 15 mmHg, the estimated right ventricular systolic pressure is 47.3 mmHg.  Left Atrium: Left atrial size was severely dilated. Right Atrium: Right atrial size was severely dilated. Pericardium: There is no evidence of pericardial effusion. Mitral Valve: The mitral valve is normal in structure. Trivial mitral valve regurgitation. No evidence of mitral valve stenosis. MV peak gradient, 5.5 mmHg. The mean mitral valve gradient is 3.0 mmHg. Tricuspid Valve: The tricuspid valve is normal in structure. Tricuspid valve regurgitation is mild . No evidence of tricuspid stenosis. Aortic Valve: The aortic valve is tricuspid. There is mild calcification of the aortic valve. Aortic valve regurgitation is not visualized. Aortic valve sclerosis/calcification is present, without any evidence of aortic stenosis. Aortic valve mean gradient measures 2.0 mmHg. Aortic valve peak gradient measures 4.1 mmHg. Aortic valve area, by VTI measures 2.91 cm. Pulmonic Valve: The pulmonic valve was normal in structure. Pulmonic valve regurgitation is trivial. No evidence of pulmonic stenosis. Aorta: The aortic root is normal in size and structure. Venous: The inferior vena cava is dilated in size with less than 50% respiratory variability, suggesting right atrial pressure of 15 mmHg. IAS/Shunts: No atrial level shunt detected by color flow Doppler.  LEFT VENTRICLE PLAX 2D LVIDd:         4.90 cm LVIDs:         3.70 cm LV PW:         1.20 cm LV IVS:        1.40 cm LVOT diam:     2.20 cm LV SV:         48 LV SV Index:   21 LVOT Area:     3.80 cm  RIGHT VENTRICLE          IVC RV Basal diam:  4.00 cm   IVC diam: 2.30 cm TAPSE (M-mode): 0.7 cm LEFT ATRIUM            Index        RIGHT ATRIUM           Index LA Vol (A2C): 71.7 ml  31.62 ml/m  RA Area:     33.40 cm LA Vol (A4C): 126.0 ml 55.57 ml/m  RA Volume:   117.00 ml 51.60 ml/m  AORTIC VALVE                    PULMONIC VALVE AV Area (Vmax):    3.14 cm     PV Vmax:       0.80 m/s AV Area (Vmean):   2.61 cm     PV Peak grad:  2.6 mmHg AV Area (VTI):     2.91 cm AV Vmax:           101.00 cm/s AV Vmean:          73.000 cm/s AV VTI:            0.166 m AV Peak Grad:      4.1 mmHg AV Mean Grad:      2.0 mmHg LVOT Vmax:         83.30 cm/s LVOT Vmean:        50.200 cm/s LVOT VTI:          0.127 m LVOT/AV VTI ratio: 0.77  AORTA Ao Root diam: 2.90 cm Ao Asc diam:  3.50 cm MITRAL VALVE              TRICUSPID VALVE MV Area (PHT): 3.93 cm   TR Peak grad:   32.3 mmHg MV Area VTI:   2.87 cm   TR Vmax:  284.00 cm/s MV Peak grad:  5.5 mmHg MV Mean grad:  3.0 mmHg   SHUNTS MV Vmax:       1.17 m/s   Systemic VTI:  0.13 m MV Vmean:      79.4 cm/s  Systemic Diam: 2.20 cm Arvilla Meres MD Electronically signed by Arvilla Meres MD Signature Date/Time: 11/21/2023/3:15:56 PM    Final     Principal Problem:   COPD with acute exacerbation (HCC) Active Problems:   Acute on chronic respiratory failure with hypoxia and hypercapnia (HCC)     Sherri Hart, [facility]  If 7PM-7AM, please contact night-coverage [email or web address]   LOS: 2 days

## 2023-11-22 DIAGNOSIS — J9621 Acute and chronic respiratory failure with hypoxia: Secondary | ICD-10-CM | POA: Diagnosis not present

## 2023-11-22 DIAGNOSIS — I2781 Cor pulmonale (chronic): Secondary | ICD-10-CM | POA: Diagnosis not present

## 2023-11-22 DIAGNOSIS — I4891 Unspecified atrial fibrillation: Secondary | ICD-10-CM | POA: Diagnosis not present

## 2023-11-22 DIAGNOSIS — J441 Chronic obstructive pulmonary disease with (acute) exacerbation: Secondary | ICD-10-CM | POA: Diagnosis not present

## 2023-11-22 DIAGNOSIS — J449 Chronic obstructive pulmonary disease, unspecified: Secondary | ICD-10-CM | POA: Diagnosis not present

## 2023-11-22 MED ORDER — SALINE SPRAY 0.65 % NA SOLN
1.0000 | NASAL | Status: DC | PRN
  Filled 2023-11-22: qty 44

## 2023-11-22 MED ORDER — RISPERIDONE 0.5 MG PO TABS
0.2500 mg | ORAL_TABLET | Freq: Every day | ORAL | Status: AC
  Administered 2023-11-22: 0.25 mg via ORAL
  Filled 2023-11-22: qty 1

## 2023-11-22 MED ORDER — IPRATROPIUM-ALBUTEROL 0.5-2.5 (3) MG/3ML IN SOLN
3.0000 mL | Freq: Three times a day (TID) | RESPIRATORY_TRACT | Status: DC
  Administered 2023-11-22 – 2023-11-28 (×17): 3 mL via RESPIRATORY_TRACT
  Filled 2023-11-22 (×19): qty 3

## 2023-11-22 MED ORDER — FUROSEMIDE 10 MG/ML IJ SOLN
40.0000 mg | Freq: Every day | INTRAMUSCULAR | Status: AC
  Administered 2023-11-22 – 2023-11-25 (×4): 40 mg via INTRAVENOUS
  Filled 2023-11-22 (×4): qty 4

## 2023-11-22 NOTE — Plan of Care (Signed)
  Problem: Education: Goal: Knowledge of General Education information will improve Description: Including pain rating scale, medication(s)/side effects and non-pharmacologic comfort measures Outcome: Progressing   Problem: Health Behavior/Discharge Planning: Goal: Ability to manage health-related needs will improve Outcome: Progressing   Problem: Nutrition: Goal: Adequate nutrition will be maintained Outcome: Progressing   Problem: Activity: Goal: Risk for activity intolerance will decrease Outcome: Progressing   Problem: Coping: Goal: Level of anxiety will decrease Outcome: Progressing   Problem: Elimination: Goal: Will not experience complications related to bowel motility Outcome: Progressing Goal: Will not experience complications related to urinary retention Outcome: Progressing

## 2023-11-22 NOTE — Progress Notes (Signed)
   NAME:  Sherri Hart, MRN:  [id number], DOB:  16-May-1946, LOS: 3 ADMISSION DATE:  11/19/2023, CONSULTATION DATE: 11/20/2023 REFERRING MD: Dr. Idelle Leech, CHIEF COMPLAINT: COPD exacerbation  History of Present Illness:  Asked to see patient for shortness of breath  Was brought in with altered mental status Friend activated EMS  Oxygen concentrator had not been working well for the last couple of weeks She has chronic back pain, has not been using any medications, has been hurting more the last week to 2 weeks-was on some opiates previously while she was living in [place] Has not been around anybody with a febrile illness  Has been sleeping more in the recliner because of low back pain, slight increase in work of breathing over the last week  Active smoker less than a pack a day  History of atrial fibrillation, compliant with Xarelto  Pertinent  Medical History   Tobacco use  COPD Afib on xarelto Chronic hypoxia   Significant Hospital Events: Including procedures, antibiotic start and stop dates in addition to other pertinent events   CT scan 06/03/2022-some pulmonary congestion, emphysema Chest x-ray 11/19/2023-some atelectasis  Interim History / Subjective:  Remains on 7 L nasal cannula. "I am ready to go home"  Objective   Blood pressure (!) 134/96, pulse 100, temperature 97.7 F (36.5 C), temperature source Oral, resp. rate 20, height 5\' 6"  (1.676 m), weight 121.9 kg, SpO2 92%.        Intake/Output Summary (Last 24 hours) at 11/22/2023 1234 Last data filed at 11/22/2023 0900 Gross per 24 hour  Intake 240 ml  Output 1150 ml  Net -910 ml   Filed Weights   11/20/23 0453  Weight: 121.9 kg    Examination: General: Elderly chronically ill appearing woman sitting in chair HENT: Poor dentition, NCAT.  No JVD Lungs: Scattered rhonchi, no accessory muscle use Cardiovascular: s1s2 cap refill is brisk  Abdomen: soft round abd  Extremities: No pitting edema. Some flank  edema  Neuro: AAOx3 , no asterixis GU: defer   Chest x-ray shows cardiomegaly, vascular congestion, retrocardiac consolidation versus atelectasis  Resolved Hospital Problem list     Assessment & Plan:   AECOPD AoC hypoxic respiratory failure  Tobacco use  - anoro ellipta + duoneb at home -bibasilar opacities on CXR, favored to be atelectasis or scarring  P -Continue triple therapy IV Solu-Medrol 40 every 12 -cont flutter, IS  -Will need home O2 on discharge -smoking cessation counseling   Afib on xarelto  Acute cor pulmonale/RVSP 47 with decreased RV SF on echo -cont xarelto -Agree with IV diuresis   Kayann Maj V. Vassie Loll MD [facility] Pulmonary/Critical Care Medicine Amion for pager  11/22/2023, 12:34 PM

## 2023-11-22 NOTE — Progress Notes (Addendum)
 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  She was requiring 10 L HFNC and was treated with steroids and inhaled bronchodilators.  Subjective:  Patient states she is the same, no change.  Feels she can go home as long as she has oxygen.   Objective: Vitals:   11/22/23 0815 11/22/23 0819 11/22/23 1321 11/22/23 1618  BP:   124/70   Pulse:   (!) 101   Resp:   17   Temp:   98.4 F (36.9 C)   TempSrc:   Oral   SpO2: 90% 92% 94% (P) 92%  Weight:      Height:        Intake/Output Summary (Last 24 hours) at 11/22/2023 1644 Last data filed at 11/22/2023 1343 Gross per 24 hour  Intake 680 ml  Output 1850 ml  Net -1170 ml   Filed Weights   11/20/23 0453  Weight: 121.9 kg     Exam:  General: Patient in good spirits sitting up in chair with oxygen via [place] at 9L HFNC Eyes: sclera anicteric, conjuctiva mild injection bilaterally, poor dentition CVS: S1-S2, regular  Respiratory: Rhonchorous breath sounds, also transmitted upper respiratory sounds, no wheezing GI: NABS, soft, NT  LE: Warm and well-perfused Neuro: A/O x 3,  grossly nonfocal.  Psych: patient is logical and coherent, judgement and insight appear normal, mood and affect appropriate to situation.  Data Reviewed:  Basic Metabolic Panel: Recent Labs  Lab 11/19/23 1347 11/20/23 0440  NA 138 140  K 3.5 4.4  CL 107 102  CO2 22 27  GLUCOSE 136* 193*  BUN 18 21  CREATININE 0.89 0.97  CALCIUM 8.2* 8.8*    CBC: Recent Labs  Lab 11/19/23 1347 11/20/23 0440  WBC 14.8* 21.3*  HGB 13.9 14.0  HCT 44.9 46.1*  MCV 106.1* 106.0*  PLT 176 184     Scheduled Meds:  arformoterol  15 mcg Nebulization BID   budesonide  (PULMICORT) nebulizer solution  0.5 mg Nebulization BID   diltiazem  180 mg Oral QHS   furosemide  40 mg Intravenous Daily   guaiFENesin  600 mg Oral BID   influenza vaccine adjuvanted  0.5 mL Intramuscular Tomorrow-1000   ipratropium-albuterol  3 mL Nebulization TID   loratadine  10 mg Oral Daily   methylPREDNISolone (SOLU-MEDROL) injection  40 mg Intravenous Q12H   nicotine  21 mg Transdermal Daily   pravastatin  20 mg Oral QHS   revefenacin  175 mcg Nebulization Daily   rivaroxaban  20 mg Oral Q supper   Continuous Infusions:  azithromycin 500 mg (11/22/23 1635)     Assessment & Plan:   Acute exacerbation of COPD Acute on chronic hypoxic respiratory failure Possible decompensated HFpEF No real improvement in oxygen requirement despite ongoing use of steroids and inhaled bronchodilators Echocardiogram done yesterday shows moderate pulmonary hypertension, normal EF and some diastolic dysfunction Given elevated BNP and review of chest x-ray, will start Lasix 40 mg IV daily, follow urine output--of note, patient had 900 cc urine output to Lasix 40 mg IV this morning, continue 40 daily IV Continue steroids and inhaled bronchodilators and Anoro Ellipta Patient appears relatively comfortable with no increased work of breathing on my  exam Empirically started on Zithromax which I will continue, does have bibasilar opacities but thought to be atelectasis or scarring  PAF Rate is well-controlled on Cardizem Patient on Xarelto for secondary prevention of stroke      DVT prophylaxis: Xarelto Code Status: Full Family Communication: Patient's granddaughter was at bedside and had spent the night     Studies: DG CHEST PORT 1 VIEW Result Date: 11/21/2023 CLINICAL DATA:  Acute hypoxic respiratory failure EXAM: PORTABLE CHEST 1 VIEW COMPARISON:  11/19/2023 FINDINGS: Single frontal view of the chest demonstrates continued enlargement the cardiac silhouette. There is progressive pulmonary  vascular congestion, with mild basilar predominant ground-glass airspace disease. Denser consolidation in the retrocardiac region left lower lobe could reflect airspace disease or atelectasis. No large effusion or pneumothorax. No acute bony abnormalities. IMPRESSION: 1. Findings most consistent with congestive heart failure and worsening volume status. 2. Denser consolidation within the retrocardiac region left lower lobe, which could reflect atelectasis or airspace disease. Electronically Signed   By: Sharlet Salina M.D.   On: 11/21/2023 18:00   ECHOCARDIOGRAM COMPLETE Result Date: 11/21/2023    ECHOCARDIOGRAM REPORT   Patient Name:   Sherri Hart Date of Exam: 11/21/2023 Medical Rec #:  [id number]          Height:       66.0 in Accession #:    [id number]         Weight:       268.7 lb Date of Birth:  [birth year]           BSA:          2.267 m Patient Age:    77 years           BP:           136/72 mmHg Patient Gender: F                  HR:           95 bpm. Exam Location:  Inpatient Procedure: 2D Echo, Cardiac Doppler and Color Doppler (Both Spectral and Color            Flow Doppler were utilized during procedure). Indications:    Dyspnea  History:        Patient has prior history of Echocardiogram examinations, most                 recent 11/29/2022. CHF, COPD, Arrythmias:Atrial Fibrillation;                 Risk Factors:Current Smoker.  Sonographer:    Amy Chionchio Referring Phys: [id number] ADEWALE A OLALERE IMPRESSIONS  1. Left ventricular ejection fraction, by estimation, is 60 to 65%. The left ventricle has normal function. The left ventricle has no regional wall motion abnormalities. There is mild concentric left ventricular hypertrophy. Left ventricular diastolic parameters are indeterminate.  2. Right ventricular systolic function is mildly reduced. The right ventricular size is mildly enlarged. There is moderately elevated pulmonary artery systolic pressure. The estimated right ventricular systolic  pressure is 47.3 mmHg.  3. Left atrial size was severely dilated.  4. Right atrial size was severely dilated.  5. The mitral valve is normal in structure. Trivial mitral valve regurgitation. No evidence of mitral stenosis.  6. The aortic valve is tricuspid. There is mild calcification of the aortic valve. Aortic valve regurgitation is not visualized. Aortic valve sclerosis/calcification is present, without any evidence of aortic stenosis.  7. The inferior vena cava is dilated  in size with <50% respiratory variability, suggesting right atrial pressure of 15 mmHg. FINDINGS  Left Ventricle: Left ventricular ejection fraction, by estimation, is 60 to 65%. The left ventricle has normal function. The left ventricle has no regional wall motion abnormalities. The left ventricular internal cavity size was normal in size. There is  mild concentric left ventricular hypertrophy. Left ventricular diastolic parameters are indeterminate. Right Ventricle: The right ventricular size is mildly enlarged. No increase in right ventricular wall thickness. Right ventricular systolic function is mildly reduced. There is moderately elevated pulmonary artery systolic pressure. The tricuspid regurgitant velocity is 2.84 m/s, and with an assumed right atrial pressure of 15 mmHg, the estimated right ventricular systolic pressure is 47.3 mmHg. Left Atrium: Left atrial size was severely dilated. Right Atrium: Right atrial size was severely dilated. Pericardium: There is no evidence of pericardial effusion. Mitral Valve: The mitral valve is normal in structure. Trivial mitral valve regurgitation. No evidence of mitral valve stenosis. MV peak gradient, 5.5 mmHg. The mean mitral valve gradient is 3.0 mmHg. Tricuspid Valve: The tricuspid valve is normal in structure. Tricuspid valve regurgitation is mild . No evidence of tricuspid stenosis. Aortic Valve: The aortic valve is tricuspid. There is mild calcification of the aortic valve. Aortic valve  regurgitation is not visualized. Aortic valve sclerosis/calcification is present, without any evidence of aortic stenosis. Aortic valve mean gradient measures 2.0 mmHg. Aortic valve peak gradient measures 4.1 mmHg. Aortic valve area, by VTI measures 2.91 cm. Pulmonic Valve: The pulmonic valve was normal in structure. Pulmonic valve regurgitation is trivial. No evidence of pulmonic stenosis. Aorta: The aortic root is normal in size and structure. Venous: The inferior vena cava is dilated in size with less than 50% respiratory variability, suggesting right atrial pressure of 15 mmHg. IAS/Shunts: No atrial level shunt detected by color flow Doppler.  LEFT VENTRICLE PLAX 2D LVIDd:         4.90 cm LVIDs:         3.70 cm LV PW:         1.20 cm LV IVS:        1.40 cm LVOT diam:     2.20 cm LV SV:         48 LV SV Index:   21 LVOT Area:     3.80 cm  RIGHT VENTRICLE          IVC RV Basal diam:  4.00 cm  IVC diam: 2.30 cm TAPSE (M-mode): 0.7 cm LEFT ATRIUM            Index        RIGHT ATRIUM           Index LA Vol (A2C): 71.7 ml  31.62 ml/m  RA Area:     33.40 cm LA Vol (A4C): 126.0 ml 55.57 ml/m  RA Volume:   117.00 ml 51.60 ml/m  AORTIC VALVE                    PULMONIC VALVE AV Area (Vmax):    3.14 cm     PV Vmax:       0.80 m/s AV Area (Vmean):   2.61 cm     PV Peak grad:  2.6 mmHg AV Area (VTI):     2.91 cm AV Vmax:           101.00 cm/s AV Vmean:          73.000 cm/s AV VTI:  0.166 m AV Peak Grad:      4.1 mmHg AV Mean Grad:      2.0 mmHg LVOT Vmax:         83.30 cm/s LVOT Vmean:        50.200 cm/s LVOT VTI:          0.127 m LVOT/AV VTI ratio: 0.77  AORTA Ao Root diam: 2.90 cm Ao Asc diam:  3.50 cm MITRAL VALVE              TRICUSPID VALVE MV Area (PHT): 3.93 cm   TR Peak grad:   32.3 mmHg MV Area VTI:   2.87 cm   TR Vmax:        284.00 cm/s MV Peak grad:  5.5 mmHg MV Mean grad:  3.0 mmHg   SHUNTS MV Vmax:       1.17 m/s   Systemic VTI:  0.13 m MV Vmean:      79.4 cm/s  Systemic Diam: 2.20 cm  Arvilla Meres MD Electronically signed by Arvilla Meres MD Signature Date/Time: 11/21/2023/3:15:56 PM    Final     Principal Problem:   COPD with acute exacerbation (HCC) Active Problems:   Acute on chronic respiratory failure with hypoxia and hypercapnia (HCC)     Masin Shatto Orma Flaming, [facility]  If 7PM-7AM, please contact night-coverage [email or web address]   LOS: 3 days

## 2023-11-22 NOTE — Progress Notes (Signed)
 PT refused scheduled 1200 nebulizer treatment. PT states she is breathing "good."

## 2023-11-22 NOTE — Plan of Care (Signed)

## 2023-11-23 DIAGNOSIS — I4891 Unspecified atrial fibrillation: Secondary | ICD-10-CM | POA: Diagnosis not present

## 2023-11-23 DIAGNOSIS — J9601 Acute respiratory failure with hypoxia: Secondary | ICD-10-CM | POA: Diagnosis not present

## 2023-11-23 DIAGNOSIS — J449 Chronic obstructive pulmonary disease, unspecified: Secondary | ICD-10-CM | POA: Diagnosis not present

## 2023-11-23 DIAGNOSIS — J441 Chronic obstructive pulmonary disease with (acute) exacerbation: Secondary | ICD-10-CM | POA: Diagnosis not present

## 2023-11-23 DIAGNOSIS — J9621 Acute and chronic respiratory failure with hypoxia: Secondary | ICD-10-CM | POA: Diagnosis not present

## 2023-11-23 DIAGNOSIS — I2781 Cor pulmonale (chronic): Secondary | ICD-10-CM | POA: Diagnosis not present

## 2023-11-23 LAB — BASIC METABOLIC PANEL
Anion gap: 12 (ref 5–15)
BUN: 44 mg/dL — ABNORMAL HIGH (ref 8–23)
CO2: 31 mmol/L (ref 22–32)
Calcium: 9.2 mg/dL (ref 8.9–10.3)
Chloride: 98 mmol/L (ref 98–111)
Creatinine, Ser: 1.04 mg/dL — ABNORMAL HIGH (ref 0.44–1.00)
GFR, Estimated: 56 mL/min — ABNORMAL LOW (ref >60)
Glucose, Bld: 149 mg/dL — ABNORMAL HIGH (ref 70–99)
Potassium: 4.1 mmol/L (ref 3.5–5.1)
Sodium: 141 mmol/L (ref 135–145)

## 2023-11-23 MED ORDER — METHYLPREDNISOLONE SODIUM SUCC 40 MG IJ SOLR
40.0000 mg | Freq: Every day | INTRAMUSCULAR | Status: DC
Start: 2023-11-24 — End: 2023-11-24
  Administered 2023-11-24: 40 mg via INTRAVENOUS
  Filled 2023-11-23: qty 1

## 2023-11-23 NOTE — Progress Notes (Addendum)
 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  She was requiring 10 L HFNC and was treated with steroids and inhaled bronchodilators.  Subjective:  Patient states again that she very much wants to go home.  Attentive granddaughter at bedside says to her "I know you just want to go and smoke".  We discussed again that she is on 7 L HFNC and she cannot go home on that right now.  She is pleased that oxygen requirement is decreasing.   Objective: Vitals:   11/23/23 0952 11/23/23 1214 11/23/23 1456 11/23/23 1553  BP:  138/72    Pulse:  (!) 101    Resp:      Temp:  97.8 F (36.6 C)    TempSrc:  Oral    SpO2: 91% 99% 95% 95%  Weight:      Height:        Intake/Output Summary (Last 24 hours) at 11/23/2023 1715 Last data filed at 11/23/2023 0620 Gross per 24 hour  Intake 360 ml  Output 1000 ml  Net -640 ml   Filed Weights   11/20/23 0453  Weight: 121.9 kg     Exam:  General: Patient in good spirits sitting up in chair with oxygen via [place] at 7L HFNC Eyes: sclera anicteric, conjuctiva mild injection bilaterally, poor dentition CVS: S1-S2, regular  Respiratory: Rhonchorous breath sounds, also transmitted upper respiratory sounds, no wheezing GI: NABS, soft, NT  LE: Warm and well-perfused Neuro: A/O x 3,  grossly nonfocal.  Psych: patient is logical and coherent, judgement and insight appear normal, mood and affect appropriate to situation.  Data Reviewed:  Basic Metabolic Panel: Recent Labs  Lab 11/19/23 1347 11/20/23 0440 11/23/23 0922  NA 138 140 141  K 3.5 4.4 4.1  CL 107 102 98  CO2 22 27 31   GLUCOSE 136* 193* 149*  BUN 18 21 44*  CREATININE 0.89 0.97 1.04*  CALCIUM 8.2*  8.8* 9.2    CBC: Recent Labs  Lab 11/19/23 1347 11/20/23 0440  WBC 14.8* 21.3*  HGB 13.9 14.0  HCT 44.9 46.1*  MCV 106.1* 106.0*  PLT 176 184     Scheduled Meds:  arformoterol  15 mcg Nebulization BID   budesonide (PULMICORT) nebulizer solution  0.5 mg Nebulization BID   diltiazem  180 mg Oral QHS   furosemide  40 mg Intravenous Daily   guaiFENesin  600 mg Oral BID   influenza vaccine adjuvanted  0.5 mL Intramuscular Tomorrow-1000   ipratropium-albuterol  3 mL Nebulization TID   loratadine  10 mg Oral Daily   [START ON 11/24/2023] methylPREDNISolone (SOLU-MEDROL) injection  40 mg Intravenous Daily   nicotine  21 mg Transdermal Daily   pravastatin  20 mg Oral QHS   revefenacin  175 mcg Nebulization Daily   rivaroxaban  20 mg Oral Q supper   Continuous Infusions:  azithromycin 500 mg (11/22/23 1635)     Assessment & Plan:   Acute exacerbation of COPD Decompensated HFpEF  Acute on chronic hypoxic respiratory failure Improved oxygenation with initiation of Lasix 40 daily Yesterday patient had 900 cc output to 40 of Lasix IV Oxygen requirement decreased from 9 L to 7 L HFNC Continue steroids and bronchodilators and  Anoro Ellipta for COPD exacerbation Patient will need new oxygen compressor when she goes home as it has not been working Echocardiogram done yesterday shows moderate pulmonary hypertension, normal EF and some diastolic dysfunction Patient appears relatively comfortable with no increased work of breathing on my exam Patient completed 3-day course of azithromycin Continue nicotine patch  Chronic back pain Patient has been receiving as needed Vicodin while she has been here for chronic back pain Patient's granddaughter states that she had addiction issues when she was in [place], has not been on any narcotic pain medication since she has been in [place] for the past 3 to 4 years.  She would appreciate it if no narcotics were prescribed on  discharge.  PAF Rate is well-controlled on Cardizem Patient on Xarelto for secondary prevention of stroke      DVT prophylaxis: Xarelto Code Status: Full Family Communication: Patient's granddaughter was at bedside and had spent the night     Studies: No results found.   Principal Problem:   COPD with acute exacerbation (HCC) Active Problems:   Acute on chronic respiratory failure with hypoxia and hypercapnia (HCC)     Kaydan Wong Orma Flaming, [facility]  If 7PM-7AM, please contact night-coverage [email or web address]   LOS: 4 days

## 2023-11-23 NOTE — Progress Notes (Signed)
   NAME:  Sherri Hart, MRN:  [id number], DOB:  Jan 18, 1946, LOS: 4 ADMISSION DATE:  11/19/2023, CONSULTATION DATE: 11/20/2023 REFERRING MD: Dr. Idelle Leech, CHIEF COMPLAINT: COPD exacerbation  History of Present Illness:  Asked to see patient for shortness of breath  Was brought in with altered mental status Friend activated EMS  Oxygen concentrator had not been working well for the last couple of weeks She has chronic back pain, has not been using any medications, has been hurting more the last week to 2 weeks-was on some opiates previously while she was living in [place] Has not been around anybody with a febrile illness  Has been sleeping more in the recliner because of low back pain, slight increase in work of breathing over the last week  Active smoker less than a pack a day  History of atrial fibrillation, compliant with Xarelto  Pertinent  Medical History   Tobacco use  COPD Afib on xarelto Chronic hypoxia   Significant Hospital Events: Including procedures, antibiotic start and stop dates in addition to other pertinent events   CT scan 06/03/2022-some pulmonary congestion, emphysema Chest x-ray 11/19/2023-some atelectasis  Interim History / Subjective:  Remains on 7 L nasal cannula. Granddaughter at bedside Receiving neb Eager to go home Diuresed 2.5 L with Lasix  Objective   Blood pressure 137/71, pulse 83, temperature 98.3 F (36.8 C), temperature source Oral, resp. rate 17, height 5\' 6"  (1.676 m), weight 121.9 kg, SpO2 91%.        Intake/Output Summary (Last 24 hours) at 11/23/2023 1123 Last data filed at 11/23/2023 4098 Gross per 24 hour  Intake 800 ml  Output 1900 ml  Net -1100 ml   Filed Weights   11/20/23 0453  Weight: 121.9 kg    Examination: General: Elderly chronically ill appearing woman sitting in bed HENT: Poor dentition, NCAT.  No JVD Lungs: Decreased breath sounds bilateral, no accessory muscle use Cardiovascular: s1s2 regular Abdomen:  soft round abd  Extremities: No pitting edema. Some flank edema  Neuro: AAOx3 , no asterixis GU: defer   Chest x-ray 3/14 shows cardiomegaly, vascular congestion, retrocardiac consolidation versus atelectasis Labs show normal electrolytes, leukocytosis, low procalcitonin  Resolved Hospital Problem list     Assessment & Plan:   AECOPD AoC hypoxic respiratory failure  Tobacco use  - anoro ellipta + duoneb at home -bibasilar opacities on CXR, favored to be atelectasis or scarring  P -Continue triple therapy -Decrease Solu-Medrol 40 daily -cont flutter, IS  -Will need home O2 on discharge -smoking cessation counseling , continue nicotine patch  Afib on xarelto  Acute cor pulmonale/RVSP 47 with decreased RV SF on echo -cont xarelto -Continue IV diuresis   Alvey Brockel V. Vassie Loll MD [facility] Pulmonary/Critical Care Medicine Amion for pager  11/23/2023, 11:23 AM

## 2023-11-24 ENCOUNTER — Telehealth: Payer: Self-pay | Admitting: Acute Care

## 2023-11-24 DIAGNOSIS — J441 Chronic obstructive pulmonary disease with (acute) exacerbation: Secondary | ICD-10-CM | POA: Diagnosis not present

## 2023-11-24 DIAGNOSIS — I5030 Unspecified diastolic (congestive) heart failure: Secondary | ICD-10-CM

## 2023-11-24 DIAGNOSIS — J9601 Acute respiratory failure with hypoxia: Secondary | ICD-10-CM | POA: Diagnosis not present

## 2023-11-24 MED ORDER — PREDNISONE 20 MG PO TABS
40.0000 mg | ORAL_TABLET | Freq: Every day | ORAL | Status: DC
  Administered 2023-11-25 – 2023-11-28 (×4): 40 mg via ORAL
  Filled 2023-11-24 (×4): qty 2

## 2023-11-24 NOTE — Progress Notes (Signed)
 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  She was requiring 10 L HFNC and was treated with steroids and inhaled bronchodilators.  Currently oxygen need has improved to 5 L HFNC.  Subjective:  Patient states again that she very much wants to go home.  Reports improvement in overall respiratory status.  I explained to her that her oxygen need has come down from 10 L to 5 L and it will need to come down little further before she could be discharged home.  Objective: Vitals:   11/24/23 0455 11/24/23 0803 11/24/23 0947 11/24/23 1221  BP: (!) 154/99   139/84  Pulse: 89   (!) 58  Resp: 17   20  Temp: 97.8 F (36.6 C)   97.8 F (36.6 C)  TempSrc: Oral   Oral  SpO2: 94% 96% 95% 96%  Weight:      Height:        Intake/Output Summary (Last 24 hours) at 11/24/2023 1254 Last data filed at 11/24/2023 9562 Gross per 24 hour  Intake 700 ml  Output 1600 ml  Net -900 ml   Filed Weights   11/20/23 0453  Weight: 121.9 kg     Exam:  General: Patient in good spirits sitting up in chair with oxygen via [place] at 7L HFNC Eyes: sclera anicteric, conjuctiva mild injection bilaterally, poor dentition CVS: S1-S2, regular  Respiratory: Rhonchorous breath sounds, also transmitted upper respiratory sounds, no wheezing GI: NABS, soft, NT  LE: Warm and well-perfused Neuro: A/O x 3,  grossly nonfocal.  Psych: patient is logical and coherent, judgement and insight appear normal, mood and affect appropriate to situation.  Data Reviewed:  Basic Metabolic Panel: Recent Labs  Lab 11/19/23 1347 11/20/23 0440 11/23/23 0922  NA 138 140 141  K 3.5 4.4 4.1  CL 107 102 98  CO2 22 27 31   GLUCOSE 136* 193* 149*   BUN 18 21 44*  CREATININE 0.89 0.97 1.04*  CALCIUM 8.2* 8.8* 9.2    CBC: Recent Labs  Lab 11/19/23 1347 11/20/23 0440  WBC 14.8* 21.3*  HGB 13.9 14.0  HCT 44.9 46.1*  MCV 106.1* 106.0*  PLT 176 184     Scheduled Meds:  arformoterol  15 mcg Nebulization BID   budesonide (PULMICORT) nebulizer solution  0.5 mg Nebulization BID   diltiazem  180 mg Oral QHS   furosemide  40 mg Intravenous Daily   guaiFENesin  600 mg Oral BID   influenza vaccine adjuvanted  0.5 mL Intramuscular Tomorrow-1000   ipratropium-albuterol  3 mL Nebulization TID   loratadine  10 mg Oral Daily   nicotine  21 mg Transdermal Daily   pravastatin  20 mg Oral QHS   [START ON 11/25/2023] predniSONE  40 mg Oral Q breakfast   revefenacin  175 mcg Nebulization Daily   rivaroxaban  20 mg Oral Q supper   Continuous Infusions:     Assessment & Plan:   Acute exacerbation of COPD Decompensated HFpEF  Acute on chronic hypoxic respiratory failure Improved oxygenation with initiation of Lasix 40 daily Yesterday patient had 900 cc output to 40 of Lasix IV Oxygen requirement decreased from 9 L to 7 L to 5L HFNC Continue steroids and  bronchodilators and Anoro Ellipta for COPD exacerbation Patient will need new oxygen compressor when she goes home as it has not been working. Echocardiogram done yesterday shows moderate pulmonary hypertension, normal EF and some diastolic dysfunction Patient appears relatively comfortable with no increased work of breathing on my exam Patient completed 3-day course of azithromycin Continue nicotine patch  Chronic back pain Patient has been receiving as needed Vicodin while she has been here for chronic back pain Patient's granddaughter states that she had addiction issues when she was in [place], has not been on any narcotic pain medication since she has been in [place] for the past 3 to 4 years.  She would appreciate it if no narcotics were prescribed on  discharge.  PAF Rate is well-controlled on Cardizem Patient on Xarelto for secondary prevention of stroke      DVT prophylaxis: Xarelto Code Status: Full Family Communication: Patient's granddaughter was at bedside and had spent the night     Studies: No results found.   Principal Problem:   COPD with acute exacerbation (HCC) Active Problems:   Acute on chronic respiratory failure with hypoxia and hypercapnia (HCC)     Sherri Hart P Leana Springston, [facility]  If 7PM-7AM, please contact night-coverage [email or web address]   LOS: 5 days

## 2023-11-24 NOTE — Progress Notes (Signed)
   NAME:  Sherri Hart, MRN:  [id number], DOB:  May 09, 1946, LOS: 5 ADMISSION DATE:  11/19/2023, CONSULTATION DATE: 11/20/2023 REFERRING MD: Dr. Idelle Leech, CHIEF COMPLAINT: COPD exacerbation  History of Present Illness:  Asked to see patient for shortness of breath  Was brought in with altered mental status Friend activated EMS  Oxygen concentrator had not been working well for the last couple of weeks She has chronic back pain, has not been using any medications, has been hurting more the last week to 2 weeks-was on some opiates previously while she was living in [place] Has not been around anybody with a febrile illness  Has been sleeping more in the recliner because of low back pain, slight increase in work of breathing over the last week  Active smoker less than a pack a day  History of atrial fibrillation, compliant with Xarelto  Pertinent  Medical History   Tobacco use  COPD Afib on xarelto Chronic hypoxia   Significant Hospital Events: Including procedures, antibiotic start and stop dates in addition to other pertinent events   CT scan 06/03/2022-some pulmonary congestion, emphysema Chest x-ray 11/19/2023-some atelectasis  Interim History / Subjective:  Oxygen needs down to 5 lpm. Feels much better. Anxious to go home   Objective   Blood pressure (!) 154/99, pulse 89, temperature 97.8 F (36.6 C), temperature source Oral, resp. rate 17, height 5\' 6"  (1.676 m), weight 121.9 kg, SpO2 95%.    FiO2 (%):  [40 %] 40 %   Intake/Output Summary (Last 24 hours) at 11/24/2023 1026 Last data filed at 11/23/2023 1843 Gross per 24 hour  Intake 480 ml  Output 1000 ml  Net -520 ml   Filed Weights   11/20/23 0453  Weight: 121.9 kg    Examination: General this is a 78 year old female sitting up in chair no distress HENT NCAT poor dentition MMM Pulm dec bases some wheezing no accessory use Card reg Abd soft Ext still w/ sig LE edema scattered areas of ecchymosis over all ext  warm pulses are strong  Neuro intact  Resolved Hospital Problem list     Assessment & Plan:   AECOPD AoC hypoxic respiratory failure  Tobacco use  Afib on xarelto  Acute cor pulmonale/RVSP 47 with decreased RV SF on echo Basilar atx   Acute on chronic hypoxic resp failure 2/2 AECOPD c/b Acute cor pulmonale/RVSP 47 with decreased RV SF on echo and acute diastolic HF plan Continue triple therapy: - she is on anoro ellipta + duoneb at home Solu-Medrol 40 daily can be changed to pred today  cont flutter, IS  Check walking oximetry daily to determine her home O2 need Daily assessment for diuretics, down to 5 lpm, typically on 2 smoking cessation counseling , continue nicotine patch Would benefit from sleep study. 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

## 2023-11-24 NOTE — Progress Notes (Signed)
 Granddaughter at bedside expressed that need to talk with social work regarding her grandmother going home with appropriate equipment. She will be here in the morning and wants to talk with MD regarding her discharge plan as well, as granddaughter is who helps take care of this pt.

## 2023-11-24 NOTE — Plan of Care (Signed)

## 2023-11-25 DIAGNOSIS — J9601 Acute respiratory failure with hypoxia: Secondary | ICD-10-CM | POA: Diagnosis not present

## 2023-11-25 DIAGNOSIS — I272 Pulmonary hypertension, unspecified: Secondary | ICD-10-CM | POA: Diagnosis not present

## 2023-11-25 DIAGNOSIS — J441 Chronic obstructive pulmonary disease with (acute) exacerbation: Secondary | ICD-10-CM | POA: Diagnosis not present

## 2023-11-25 DIAGNOSIS — I5030 Unspecified diastolic (congestive) heart failure: Secondary | ICD-10-CM | POA: Diagnosis not present

## 2023-11-25 LAB — BASIC METABOLIC PANEL
Anion gap: 12 (ref 5–15)
BUN: 35 mg/dL — ABNORMAL HIGH (ref 8–23)
CO2: 34 mmol/L — ABNORMAL HIGH (ref 22–32)
Calcium: 9.6 mg/dL (ref 8.9–10.3)
Chloride: 95 mmol/L — ABNORMAL LOW (ref 98–111)
Creatinine, Ser: 0.84 mg/dL (ref 0.44–1.00)
GFR, Estimated: >60 mL/min (ref >60)
Glucose, Bld: 129 mg/dL — ABNORMAL HIGH (ref 70–99)
Potassium: 4.4 mmol/L (ref 3.5–5.1)
Sodium: 141 mmol/L (ref 135–145)

## 2023-11-25 NOTE — Progress Notes (Signed)
 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  She was requiring 10 L HFNC and was treated with steroids and inhaled bronchodilators.  Currently on oxygen,  need has improved to 5 L HFNC.   Assessment & Plan:   Principal Problem:   COPD with acute exacerbation (HCC) Active Problems:   Acute on chronic respiratory failure with hypoxia and hypercapnia (HCC)   Acute exacerbation of COPD: Decompensated HFpEF:  Acute on chronic hypoxic respiratory failure: Improved oxygenation with initiation of IV Lasix 40 daily. Oxygen requirement decreased from 9 L to 7 L to 5L HFNC Continue steroids and bronchodilators and Anoro Ellipta for COPD exacerbation Patient will need new oxygen compressor when she goes home as it has not been working. Echocardiogram shows moderate pulmonary hypertension, Normal EF and some diastolic dysfunction Patient appears relatively comfortable with no increased work of breathing on my exam. Patient completed 3-day course of azithromycin. Continue nicotine patch.   Chronic back pain: Patient has been receiving as needed Vicodin while she has been here for chronic back pain Patient's granddaughter states that she had addiction issues when she was in [place], has not been on any narcotic pain medication since she has been in [place] for the past 3 to 4 years.  She would appreciate it if no narcotics were prescribed on discharge.   PAF: Rate is well-controlled on Cardizem. Patient on Xarelto for secondary prevention of stroke.   DVT prophylaxis: Xarelto Code Status:Full code Family Communication: No family at bed side Disposition Plan:    Status is: Inpatient Remains inpatient appropriate  because: Severity of illness.   Consultants:  Pulmonology  Procedures:  Antimicrobials:  Anti-infectives (From admission, onward)    Start     Dose/Rate Route Frequency Ordered Stop   11/20/23 1700  azithromycin (ZITHROMAX) 500 mg in sodium chloride 0.9 % 250 mL IVPB        500 mg 250 mL/hr over 60 Minutes Intravenous Every 24 hours 11/19/23 1658 11/24/23 0705   11/19/23 1645  cefTRIAXone (ROCEPHIN) 1 g in sodium chloride 0.9 % 100 mL IVPB        1 g 200 mL/hr over 30 Minutes Intravenous  Once 11/19/23 1640 11/19/23 1836   11/19/23 1645  azithromycin (ZITHROMAX) 500 mg in sodium chloride 0.9 % 250 mL IVPB        500 mg 250 mL/hr over 60 Minutes Intravenous  Once 11/19/23 1640 11/19/23 1836      Subjective: Patient was seen and examined at bedside.Overnight events noted. She still requires 5 L of high flow nasal cannula.She reports feeling better and wants to be discharged.  Objective: Vitals:   11/24/23 2130 11/25/23 0502 11/25/23 0749 11/25/23 1303  BP: (!) 153/78 (!) 150/75  134/70  Pulse:  90  94  Resp:  17  16  Temp:  97.8 F (36.6 C)  98.2 F (36.8 C)  TempSrc:  Oral  Oral  SpO2:  94% 92% 91%  Weight:      Height:        Intake/Output Summary (Last 24 hours) at 11/25/2023 1438 Last data filed at 11/25/2023 1300 Gross per 24 hour  Intake 360 ml  Output 1200 ml  Net -840 ml  Filed Weights   11/20/23 0453  Weight: 121.9 kg    Examination:  General exam: Appears calm and comfortable, deconditioned, not in any acute distress. Respiratory system: CTA Bilaterally. Respiratory effort normal. RR 17 Cardiovascular system: S1 & S2 heard, RRR. No JVD, murmurs, rubs, gallops or clicks.  Gastrointestinal system: Abdomen is non distended, soft and non tender. Normal bowel sounds heard. Central nervous system: Alert and oriented x 3. No focal neurological deficits. Extremities: No edema, no cyanosis, no clubbing Skin: No rashes, lesions or ulcers Psychiatry:  Judgement and insight appear normal. Mood & affect appropriate.     Data Reviewed: I have personally reviewed following labs and imaging studies  CBC: Recent Labs  Lab 11/19/23 1347 11/20/23 0440  WBC 14.8* 21.3*  HGB 13.9 14.0  HCT 44.9 46.1*  MCV 106.1* 106.0*  PLT 176 184   Basic Metabolic Panel: Recent Labs  Lab 11/19/23 1347 11/20/23 0440 11/23/23 0922 11/25/23 1202  NA 138 140 141 141  K 3.5 4.4 4.1 4.4  CL 107 102 98 95*  CO2 22 27 31  34*  GLUCOSE 136* 193* 149* 129*  BUN 18 21 44* 35*  CREATININE 0.89 0.97 1.04* 0.84  CALCIUM 8.2* 8.8* 9.2 9.6   GFR: Estimated Creatinine Clearance: 77 mL/min (by C-G formula based on SCr of 0.84 mg/dL). Liver Function Tests: Recent Labs  Lab 11/19/23 1347  AST 18  ALT 11  ALKPHOS 49  BILITOT 1.2  PROT 6.7  ALBUMIN 3.7   No results for input(s): "LIPASE", "AMYLASE" in the last 168 hours. No results for input(s): "AMMONIA" in the last 168 hours. Coagulation Profile: No results for input(s): "INR", "PROTIME" in the last 168 hours. Cardiac Enzymes: No results for input(s): "CKTOTAL", "CKMB", "CKMBINDEX", "TROPONINI" in the last 168 hours. BNP (last 3 results) No results for input(s): "PROBNP" in the last 8760 hours. HbA1C: No results for input(s): "HGBA1C" in the last 72 hours. CBG: No results for input(s): "GLUCAP" in the last 168 hours. Lipid Profile: No results for input(s): "CHOL", "HDL", "LDLCALC", "TRIG", "CHOLHDL", "LDLDIRECT" in the last 72 hours. Thyroid Function Tests: No results for input(s): "TSH", "T4TOTAL", "FREET4", "T3FREE", "THYROIDAB" in the last 72 hours. Anemia Panel: No results for input(s): "VITAMINB12", "FOLATE", "FERRITIN", "TIBC", "IRON", "RETICCTPCT" in the last 72 hours. Sepsis Labs: Recent Labs  Lab 11/19/23 1353 11/21/23 1346  PROCALCITON  --  0.55  LATICACIDVEN 0.4*  --     Recent Results (from the past 240 hours)  Resp panel by RT-PCR (RSV, Flu A&B, Covid) Anterior Nasal Swab      Status: None   Collection Time: 11/19/23  1:50 PM   Specimen: Anterior Nasal Swab  Result Value Ref Range Status   SARS Coronavirus 2 by RT PCR NEGATIVE NEGATIVE Final    Comment: (NOTE) SARS-CoV-2 target nucleic acids are NOT DETECTED.  The SARS-CoV-2 RNA is generally detectable in upper respiratory specimens during the acute phase of infection. The lowest concentration of SARS-CoV-2 viral copies this assay can detect is 138 copies/mL. A negative result does not preclude SARS-Cov-2 infection and should not be used as the sole basis for treatment or other patient management decisions. A negative result may occur with  improper specimen collection/handling, submission of specimen other than nasopharyngeal swab, presence of viral mutation(s) within the areas targeted by this assay, and inadequate number of viral copies(<138 copies/mL). A negative result must be combined with clinical observations, patient history, and epidemiological information. The expected result is Negative.  Fact Sheet for  Patients:  BloggerCourse.com  Fact Sheet for Healthcare Providers:  SeriousBroker.it  This test is no t yet approved or cleared by the [place] FDA and  has been authorized for detection and/or diagnosis of SARS-CoV-2 by FDA under an Emergency Use Authorization (EUA). This EUA will remain  in effect (meaning this test can be used) for the duration of the COVID-19 declaration under Section 564(b)(1) of the Act, 21 U.S.C.section 360bbb-3(b)(1), unless the authorization is terminated  or revoked sooner.       Influenza A by PCR NEGATIVE NEGATIVE Final   Influenza B by PCR NEGATIVE NEGATIVE Final    Comment: (NOTE) The Xpert Xpress SARS-CoV-2/FLU/RSV plus assay is intended as an aid in the diagnosis of influenza from Nasopharyngeal swab specimens and should not be used as a sole basis for treatment. Nasal washings and aspirates are  unacceptable for Xpert Xpress SARS-CoV-2/FLU/RSV testing.  Fact Sheet for Patients: BloggerCourse.com  Fact Sheet for Healthcare Providers: SeriousBroker.it  This test is not yet approved or cleared by the [place] FDA and has been authorized for detection and/or diagnosis of SARS-CoV-2 by FDA under an Emergency Use Authorization (EUA). This EUA will remain in effect (meaning this test can be used) for the duration of the COVID-19 declaration under Section 564(b)(1) of the Act, 21 U.S.C. section 360bbb-3(b)(1), unless the authorization is terminated or revoked.     Resp Syncytial Virus by PCR NEGATIVE NEGATIVE Final    Comment: (NOTE) Fact Sheet for Patients: BloggerCourse.com  Fact Sheet for Healthcare Providers: SeriousBroker.it  This test is not yet approved or cleared by the [place] FDA and has been authorized for detection and/or diagnosis of SARS-CoV-2 by FDA under an Emergency Use Authorization (EUA). This EUA will remain in effect (meaning this test can be used) for the duration of the COVID-19 declaration under Section 564(b)(1) of the Act, 21 U.S.C. section 360bbb-3(b)(1), unless the authorization is terminated or revoked.  Performed at [facility], [address], [place], [place] [address]    Radiology Studies: No results found.  Scheduled Meds:  arformoterol  15 mcg Nebulization BID   budesonide (PULMICORT) nebulizer solution  0.5 mg Nebulization BID   diltiazem  180 mg Oral QHS   guaiFENesin  600 mg Oral BID   influenza vaccine adjuvanted  0.5 mL Intramuscular Tomorrow-1000   ipratropium-albuterol  3 mL Nebulization TID   loratadine  10 mg Oral Daily   nicotine  21 mg Transdermal Daily   pravastatin  20 mg Oral QHS   predniSONE  40 mg Oral Q breakfast   revefenacin  175 mcg Nebulization Daily   rivaroxaban  20 mg Oral Q  supper   Continuous Infusions:   LOS: 6 days    Time spent: 50 MINS    Willeen Niece, MD [facility]   If 7PM-7AM, please contact night-coverage

## 2023-11-25 NOTE — Progress Notes (Addendum)
   NAME:  Sherri Hart, MRN:  [id number], DOB:  1946-02-28, LOS: 6 ADMISSION DATE:  11/19/2023, CONSULTATION DATE: 11/20/2023 REFERRING MD: Dr. Idelle Leech, CHIEF COMPLAINT: COPD exacerbation  History of Present Illness:  Asked to see patient for shortness of breath  Was brought in with altered mental status Friend activated EMS  Oxygen concentrator had not been working well for the last couple of weeks She has chronic back pain, has not been using any medications, has been hurting more the last week to 2 weeks-was on some opiates previously while she was living in [place] Has not been around anybody with a febrile illness  Has been sleeping more in the recliner because of low back pain, slight increase in work of breathing over the last week  Active smoker less than a pack a day  History of atrial fibrillation, compliant with Xarelto  Pertinent  Medical History   Tobacco use  COPD Afib on xarelto Chronic hypoxia   Significant Hospital Events: Including procedures, antibiotic start and stop dates in addition to other pertinent events   CT scan 06/03/2022-some pulmonary congestion, emphysema Chest x-ray 11/19/2023-some atelectasis 3/17 Oxygen needs down to 5 lpm. Feels much better. Anxious to go home  3/18 down to 4 lpm  Interim History / Subjective:  Feels better anxious to go home   Objective   Blood pressure (!) 150/75, pulse 90, temperature 97.8 F (36.6 C), temperature source Oral, resp. rate 17, height 5\' 6"  (1.676 m), weight 121.9 kg, SpO2 94%.    FiO2 (%):  [40 %] 40 %   Intake/Output Summary (Last 24 hours) at 11/25/2023 1025 Last data filed at 11/25/2023 0600 Gross per 24 hour  Intake 580 ml  Output --  Net 580 ml   Filed Weights   11/20/23 0453  Weight: 121.9 kg    Examination: General 78 year old female laying in bed no distress HENT NCAT no JVD MMM Pulm cl, dec'd bases now on 4 lpm no accessory use.  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 I suspect she will still need more than 2lpm Daily assessment for diuretics, will check bmp I think we should prob resume her oral at a higher dosing if able (would go with 80mg /d assuming cr ok) w/ plan to have her f/u w/ PCP for labs smoking cessation counseling , continue nicotine patch Would benefit from sleep study. 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

## 2023-11-25 NOTE — Plan of Care (Signed)
   Problem: Education: Goal: Knowledge of General Education information will improve Description: Including pain rating scale, medication(s)/side effects and non-pharmacologic comfort measures Outcome: Progressing   Problem: Clinical Measurements: Goal: Will remain free from infection Outcome: Progressing   Problem: Activity: Goal: Risk for activity intolerance will decrease Outcome: Progressing

## 2023-11-26 DIAGNOSIS — I272 Pulmonary hypertension, unspecified: Secondary | ICD-10-CM | POA: Insufficient documentation

## 2023-11-26 DIAGNOSIS — G8929 Other chronic pain: Secondary | ICD-10-CM | POA: Insufficient documentation

## 2023-11-26 DIAGNOSIS — I480 Paroxysmal atrial fibrillation: Secondary | ICD-10-CM | POA: Insufficient documentation

## 2023-11-26 DIAGNOSIS — J441 Chronic obstructive pulmonary disease with (acute) exacerbation: Secondary | ICD-10-CM | POA: Diagnosis not present

## 2023-11-26 MED ORDER — FUROSEMIDE 40 MG PO TABS
40.0000 mg | ORAL_TABLET | Freq: Every day | ORAL | Status: DC
  Administered 2023-11-26 – 2023-11-28 (×3): 40 mg via ORAL
  Filled 2023-11-26 (×3): qty 1

## 2023-11-26 NOTE — TOC Progression Note (Incomplete Revision)
 Transition of Care [facility]) - Progression Note    Patient Details  Name: Sherri Hart MRN: [id number] Date of Birth: [birth year]  [facility] CM/SW Contact  Larrie Kass, LCSW Phone Number: 11/26/2023, 10:17 AM  Clinical Narrative:    CSW spoke with the pt's granddaughter. She stated that she is unable to pay the bill the pt has with [employer]. She reported that she has started the patient's application for Medicaid and inquired if CSW can help expedite the process. CSW explained that she cannot expedite the Medicaid process. TOC to follow  [place]  11:20am  CSW contacted the following DME agencies [employer]- unable to take insurance   [employer]-unable to take insurance   [employer]- unable to take insurance     Expected Discharge Plan: Home/Self Care Barriers to Discharge: Continued Medical Work up  Expected Discharge Plan and Services       Living arrangements for the past 2 months: Apartment                                       Social Determinants of Health (SDOH) Interventions SDOH Screenings   Food Insecurity: No Food Insecurity (11/20/2023)  Housing: Low Risk  (11/20/2023)  Transportation Needs: No Transportation Needs (11/20/2023)  Utilities: Not At Risk (11/20/2023)  Social Connections: Socially Isolated (11/20/2023)  Tobacco Use: High Risk (11/19/2023)    Readmission Risk Interventions     No data to display

## 2023-11-26 NOTE — Progress Notes (Signed)
 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  She was requiring 10 L HFNC and was treated with steroids and inhaled bronchodilators.   New events last 24 hours / Subjective: Patient voiced wanting to go home.  However, my examination she was requiring 7 L nasal cannula O2 to maintain sats 88 to 92%.  Assessment & Plan:   Principal Problem:   COPD with acute exacerbation (HCC) Active Problems:   Nicotine dependence, cigarettes, uncomplicated   Acute on chronic diastolic CHF (congestive heart failure) (HCC)   Acute on chronic respiratory failure with hypoxia and hypercapnia (HCC)   Pulmonary hypertension (HCC)   Chronic pain   Paroxysmal atrial fibrillation (HCC)   COPD exacerbation Acute on chronic hypoxic respiratory failure -Currently requiring 7 L high flow oxygen -Completed 3-day course of azithromycin.  Continue prednisone, breathing treatment -Follow-up with outpatient pulmonology for PFTs and sleep study -Home O2 desat screen ordered   Pulmonary hypertension, acute on chronic diastolic heart failure -Lasix PO   Chronic back pain -Is now off narcotic pain medication for the past several years, after dealing with addiction issues in [place]  Paroxysmal A-fib -Xarelto, Cardizem  Leukocytosis -In setting of steroid use  Tobacco abuse -Nicotine patch  Hyperlipidemia -Pravachol  DVT prophylaxis:  rivaroxaban (XARELTO) tablet 20 mg  Code Status: Full Family Communication: none at bedside Disposition Plan: Home Status is: Inpatient Remains inpatient appropriate because: requiring O2     Antimicrobials:  Anti-infectives (From admission, onward)    Start     Dose/Rate  Route Frequency Ordered Stop   11/20/23 1700  azithromycin (ZITHROMAX) 500 mg in sodium chloride 0.9 % 250 mL IVPB        500 mg 250 mL/hr over 60 Minutes Intravenous Every 24 hours 11/19/23 1658 11/24/23 0705   11/19/23 1645  cefTRIAXone (ROCEPHIN) 1 g in sodium chloride 0.9 % 100 mL IVPB        1 g 200 mL/hr over 30 Minutes Intravenous  Once 11/19/23 1640 11/19/23 1836   11/19/23 1645  azithromycin (ZITHROMAX) 500 mg in sodium chloride 0.9 % 250 mL IVPB        500 mg 250 mL/hr over 60 Minutes Intravenous  Once 11/19/23 1640 11/19/23 1836        Objective: Vitals:   11/26/23 1000 11/26/23 1100 11/26/23 1221 11/26/23 1413  BP:   (!) 145/92   Pulse:      Resp:   17   Temp:   97.8 F (36.6 C)   TempSrc:   Oral   SpO2: 90% 92% 96% 94%  Weight:      Height:        Intake/Output Summary (Last 24 hours) at 11/26/2023 1525 Last data filed at 11/26/2023 1002 Gross per 24 hour  Intake 600 ml  Output 800 ml  Net -200 ml   Filed Weights   11/20/23 0453  Weight: 121.9 kg    Examination:  General exam: Appears calm and comfortable  Respiratory system: Clear to auscultation. Respiratory effort normal. No respiratory distress. No conversational dyspnea.  Cardiovascular system: S1 & S2 heard. No murmurs. +bilateral pedal edema. Central nervous system: Alert and oriented.  Skin: No rashes, lesions  or ulcers on exposed skin  Psychiatry: Judgement and insight appear normal. Mood & affect appropriate.   Data Reviewed: I have personally reviewed following labs and imaging studies  CBC: Recent Labs  Lab 11/20/23 0440  WBC 21.3*  HGB 14.0  HCT 46.1*  MCV 106.0*  PLT 184   Basic Metabolic Panel: Recent Labs  Lab 11/20/23 0440 11/23/23 0922 11/25/23 1202  NA 140 141 141  K 4.4 4.1 4.4  CL 102 98 95*  CO2 27 31 34*  GLUCOSE 193* 149* 129*  BUN 21 44* 35*  CREATININE 0.97 1.04* 0.84  CALCIUM 8.8* 9.2 9.6   GFR: Estimated Creatinine Clearance: 77 mL/min (by C-G formula  based on SCr of 0.84 mg/dL). Liver Function Tests: No results for input(s): "AST", "ALT", "ALKPHOS", "BILITOT", "PROT", "ALBUMIN" in the last 168 hours. No results for input(s): "LIPASE", "AMYLASE" in the last 168 hours. No results for input(s): "AMMONIA" in the last 168 hours. Coagulation Profile: No results for input(s): "INR", "PROTIME" in the last 168 hours. Cardiac Enzymes: No results for input(s): "CKTOTAL", "CKMB", "CKMBINDEX", "TROPONINI" in the last 168 hours. BNP (last 3 results) No results for input(s): "PROBNP" in the last 8760 hours. HbA1C: No results for input(s): "HGBA1C" in the last 72 hours. CBG: No results for input(s): "GLUCAP" in the last 168 hours. Lipid Profile: No results for input(s): "CHOL", "HDL", "LDLCALC", "TRIG", "CHOLHDL", "LDLDIRECT" in the last 72 hours. Thyroid Function Tests: No results for input(s): "TSH", "T4TOTAL", "FREET4", "T3FREE", "THYROIDAB" in the last 72 hours. Anemia Panel: No results for input(s): "VITAMINB12", "FOLATE", "FERRITIN", "TIBC", "IRON", "RETICCTPCT" in the last 72 hours. Sepsis Labs: Recent Labs  Lab 11/21/23 1346  PROCALCITON 0.55    Recent Results (from the past 240 hours)  Resp panel by RT-PCR (RSV, Flu A&B, Covid) Anterior Nasal Swab     Status: None   Collection Time: 11/19/23  1:50 PM   Specimen: Anterior Nasal Swab  Result Value Ref Range Status   SARS Coronavirus 2 by RT PCR NEGATIVE NEGATIVE Final    Comment: (NOTE) SARS-CoV-2 target nucleic acids are NOT DETECTED.  The SARS-CoV-2 RNA is generally detectable in upper respiratory specimens during the acute phase of infection. The lowest concentration of SARS-CoV-2 viral copies this assay can detect is 138 copies/mL. A negative result does not preclude SARS-Cov-2 infection and should not be used as the sole basis for treatment or other patient management decisions. A negative result may occur with  improper specimen collection/handling, submission of specimen  other than nasopharyngeal swab, presence of viral mutation(s) within the areas targeted by this assay, and inadequate number of viral copies(<138 copies/mL). A negative result must be combined with clinical observations, patient history, and epidemiological information. The expected result is Negative.  Fact Sheet for Patients:  BloggerCourse.com  Fact Sheet for Healthcare Providers:  SeriousBroker.it  This test is no t yet approved or cleared by the [place] FDA and  has been authorized for detection and/or diagnosis of SARS-CoV-2 by FDA under an Emergency Use Authorization (EUA). This EUA will remain  in effect (meaning this test can be used) for the duration of the COVID-19 declaration under Section 564(b)(1) of the Act, 21 U.S.C.section 360bbb-3(b)(1), unless the authorization is terminated  or revoked sooner.       Influenza A by PCR NEGATIVE NEGATIVE Final   Influenza B by PCR NEGATIVE NEGATIVE Final    Comment: (NOTE) The Xpert Xpress SARS-CoV-2/FLU/RSV plus assay is intended as an aid in the diagnosis of  influenza from Nasopharyngeal swab specimens and should not be used as a sole basis for treatment. Nasal washings and aspirates are unacceptable for Xpert Xpress SARS-CoV-2/FLU/RSV testing.  Fact Sheet for Patients: BloggerCourse.com  Fact Sheet for Healthcare Providers: SeriousBroker.it  This test is not yet approved or cleared by the [place] FDA and has been authorized for detection and/or diagnosis of SARS-CoV-2 by FDA under an Emergency Use Authorization (EUA). This EUA will remain in effect (meaning this test can be used) for the duration of the COVID-19 declaration under Section 564(b)(1) of the Act, 21 U.S.C. section 360bbb-3(b)(1), unless the authorization is terminated or revoked.     Resp Syncytial Virus by PCR NEGATIVE NEGATIVE Final     Comment: (NOTE) Fact Sheet for Patients: BloggerCourse.com  Fact Sheet for Healthcare Providers: SeriousBroker.it  This test is not yet approved or cleared by the [place] FDA and has been authorized for detection and/or diagnosis of SARS-CoV-2 by FDA under an Emergency Use Authorization (EUA). This EUA will remain in effect (meaning this test can be used) for the duration of the COVID-19 declaration under Section 564(b)(1) of the Act, 21 U.S.C. section 360bbb-3(b)(1), unless the authorization is terminated or revoked.  Performed at [facility], [address] [place], [place] [address]       Radiology Studies: No results found.    Scheduled Meds:  arformoterol  15 mcg Nebulization BID   budesonide (PULMICORT) nebulizer solution  0.5 mg Nebulization BID   diltiazem  180 mg Oral QHS   furosemide  40 mg Oral Daily   guaiFENesin  600 mg Oral BID   influenza vaccine adjuvanted  0.5 mL Intramuscular Tomorrow-1000   ipratropium-albuterol  3 mL Nebulization TID   loratadine  10 mg Oral Daily   nicotine  21 mg Transdermal Daily   pravastatin  20 mg Oral QHS   predniSONE  40 mg Oral Q breakfast   revefenacin  175 mcg Nebulization Daily   rivaroxaban  20 mg Oral Q supper   Continuous Infusions:   LOS: 7 days   Time spent: 35 minutes   Noralee Stain, DO [facility] 11/26/2023, 3:25 PM   Available via Epic secure chat 7am-7pm After these hours, please refer to coverage provider listed on [email or web address]

## 2023-11-26 NOTE — TOC Progression Note (Signed)
 Transition of Care [facility]) - Progression Note    Patient Details  Name: Sherri Hart MRN: [id number] Date of Birth: Mar 10, 1946  [facility]) CM/SW Contact  Larrie Kass, LCSW Phone Number: 11/26/2023, 10:17 AM  Clinical Narrative:    CSW spoke with the pt's granddaughter. She stated that she is unable to pay the bill the pt has with [employer]. She reported that she has started the patient's application for Medicaid and inquired if CSW can help expedite the process. CSW explained that she cannot expedite the Medicaid process. TOC to follow  [place]  11:20am  CSW contacted the following DME agencies [employer]- unable to take insurance   [employer]-unable to take insurance   [employer]- unable to take insurance    TOC to follow.   Expected Discharge Plan: Home/Self Care Barriers to Discharge: Continued Medical Work up  Expected Discharge Plan and Services       Living arrangements for the past 2 months: Apartment                                       Social Determinants of Health (SDOH) Interventions SDOH Screenings   Food Insecurity: No Food Insecurity (11/20/2023)  Housing: Low Risk  (11/20/2023)  Transportation Needs: No Transportation Needs (11/20/2023)  Utilities: Not At Risk (11/20/2023)  Social Connections: Socially Isolated (11/20/2023)  Tobacco Use: High Risk (11/19/2023)    Readmission Risk Interventions     No data to display

## 2023-11-27 DIAGNOSIS — J441 Chronic obstructive pulmonary disease with (acute) exacerbation: Secondary | ICD-10-CM | POA: Diagnosis not present

## 2023-11-27 LAB — CBC
HCT: 44.7 % (ref 36.0–46.0)
Hemoglobin: 13.6 g/dL (ref 12.0–15.0)
MCH: 31.7 pg (ref 26.0–34.0)
MCHC: 30.4 g/dL (ref 30.0–36.0)
MCV: 104.2 fL — ABNORMAL HIGH (ref 80.0–100.0)
Platelets: 201 10*3/uL (ref 150–400)
RBC: 4.29 MIL/uL (ref 3.87–5.11)
RDW: 14.7 % (ref 11.5–15.5)
WBC: 12.7 10*3/uL — ABNORMAL HIGH (ref 4.0–10.5)
nRBC: 0 % (ref 0.0–0.2)

## 2023-11-27 LAB — BASIC METABOLIC PANEL
Anion gap: 9 (ref 5–15)
BUN: 49 mg/dL — ABNORMAL HIGH (ref 8–23)
CO2: 37 mmol/L — ABNORMAL HIGH (ref 22–32)
Calcium: 8.8 mg/dL — ABNORMAL LOW (ref 8.9–10.3)
Chloride: 91 mmol/L — ABNORMAL LOW (ref 98–111)
Creatinine, Ser: 1.09 mg/dL — ABNORMAL HIGH (ref 0.44–1.00)
GFR, Estimated: 53 mL/min — ABNORMAL LOW (ref >60)
Glucose, Bld: 172 mg/dL — ABNORMAL HIGH (ref 70–99)
Potassium: 4.6 mmol/L (ref 3.5–5.1)
Sodium: 137 mmol/L (ref 135–145)

## 2023-11-27 NOTE — Progress Notes (Cosign Needed Addendum)
 SATURATION QUALIFICATIONS: (This note is used to comply with regulatory documentation for home oxygen)  Patient Saturations on Room Air at Rest = 84%  Patient Saturations on Room Air while Ambulating = 78%  Patient Saturations on 3 Liters of oxygen while Ambulating = 85% Patient Saturations on 6 Liters of oxygen while Ambulating = 93%  Please briefly explain why patient needs home oxygen: Patient currently uses 2L oxygen as baseline at home.

## 2023-11-27 NOTE — Plan of Care (Signed)

## 2023-11-27 NOTE — TOC Progression Note (Signed)
 Transition of Care [facility]) - Progression Note    Patient Details  Name: Sherri Hart MRN: [id number] Date of Birth: 1946/04/17  Transition of Care [facility]) CM/SW Contact  Larrie Kass, LCSW Phone Number: 11/27/2023, 12:56 PM  Clinical Narrative:     CSW spoke with the pt's granddaughter, Fonda Kinder, to inform her that [employer] is the only DME company that would accept the pt's insurance. Pt's daughter reported her concerns that the number provided to her was for a debt collector and that she was not comfortable giving out her card information. She stated she will contact her grandmother and discuss how they will arrange payment. TOC to follow   Expected Discharge Plan: Home/Self Care Barriers to Discharge: Continued Medical Work up  Expected Discharge Plan and Services       Living arrangements for the past 2 months: Apartment                                       Social Determinants of Health (SDOH) Interventions SDOH Screenings   Food Insecurity: No Food Insecurity (11/20/2023)  Housing: Low Risk  (11/20/2023)  Transportation Needs: No Transportation Needs (11/20/2023)  Utilities: Not At Risk (11/20/2023)  Social Connections: Socially Isolated (11/20/2023)  Tobacco Use: High Risk (11/19/2023)    Readmission Risk Interventions     No data to display

## 2023-11-27 NOTE — Progress Notes (Signed)
 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  She was requiring 10 L HFNC and was treated with steroids and inhaled bronchodilators.   New events last 24 hours / Subjective: Getting breathing tx. Granddaughter at bedside voices concern about her returning back home as patient will sit and smoke all day.  Currently, no DME agency for home oxygen  Assessment & Plan:   Principal Problem:   COPD with acute exacerbation (HCC) Active Problems:   Nicotine dependence, cigarettes, uncomplicated   Acute on chronic diastolic CHF (congestive heart failure) (HCC)   Acute on chronic respiratory failure with hypoxia and hypercapnia (HCC)   Pulmonary hypertension (HCC)   Chronic pain   Paroxysmal atrial fibrillation (HCC)   COPD exacerbation Acute on chronic hypoxic respiratory failure -Currently requiring 3 L [place] O2 -Completed 3-day course of azithromycin.  Continue prednisone, breathing treatment -Follow-up with outpatient pulmonology for PFTs and sleep study -Home O2 desat screen ordered   Pulmonary hypertension, acute on chronic diastolic heart failure -Lasix PO   Chronic back pain -Is now off narcotic pain medication for the past several years, after dealing with addiction issues in [place]  Paroxysmal A-fib -Xarelto, Cardizem  Leukocytosis -In setting of steroid use  Tobacco abuse -Nicotine patch  Hyperlipidemia -Pravachol  DVT prophylaxis:  rivaroxaban (XARELTO) tablet 20 mg  Code Status: Full Family Communication: Granddaughter at bedside Disposition Plan: Home Status is: Inpatient Remains inpatient appropriate because: requiring O2 at home     Antimicrobials:   Anti-infectives (From admission, onward)    Start     Dose/Rate Route Frequency Ordered Stop   11/20/23 1700  azithromycin (ZITHROMAX) 500 mg in sodium chloride 0.9 % 250 mL IVPB        500 mg 250 mL/hr over 60 Minutes Intravenous Every 24 hours 11/19/23 1658 11/24/23 0705   11/19/23 1645  cefTRIAXone (ROCEPHIN) 1 g in sodium chloride 0.9 % 100 mL IVPB        1 g 200 mL/hr over 30 Minutes Intravenous  Once 11/19/23 1640 11/19/23 1836   11/19/23 1645  azithromycin (ZITHROMAX) 500 mg in sodium chloride 0.9 % 250 mL IVPB        500 mg 250 mL/hr over 60 Minutes Intravenous  Once 11/19/23 1640 11/19/23 1836        Objective: Vitals:   11/26/23 2036 11/27/23 0529 11/27/23 1023 11/27/23 1130  BP: (!) 154/92 136/85  (!) 153/66  Pulse: (!) 58 100  (!) 53  Resp: 16 18  20   Temp: 98.2 F (36.8 C) (!) 97.4 F (36.3 C)  97.8 F (36.6 C)  TempSrc: Oral Oral  Oral  SpO2: 92% 92% 92% 91%  Weight:      Height:        Intake/Output Summary (Last 24 hours) at 11/27/2023 1351 Last data filed at 11/27/2023 1312 Gross per 24 hour  Intake 940 ml  Output 2550 ml  Net -1610 ml   Filed Weights   11/20/23 0453  Weight: 121.9 kg    Examination:  General exam: Appears calm and comfortable  Respiratory system: Clear to auscultation. Respiratory effort normal. No respiratory distress. No conversational dyspnea. On [place] O2  Cardiovascular system: S1 &  S2 heard. No murmurs. +bilateral pedal edema. Central nervous system: Alert and oriented.  Skin: No rashes, lesions or ulcers on exposed skin  Psychiatry: Judgement and insight appear normal. Mood & affect appropriate.   Data Reviewed: I have personally reviewed following labs and imaging studies  CBC: Recent Labs  Lab 11/27/23 0423  WBC 12.7*  HGB 13.6  HCT 44.7  MCV 104.2*  PLT 201   Basic Metabolic Panel: Recent Labs  Lab 11/23/23 0922 11/25/23 1202 11/27/23 0423  NA 141 141 137  K 4.1 4.4 4.6  CL 98 95* 91*  CO2 31 34* 37*   GLUCOSE 149* 129* 172*  BUN 44* 35* 49*  CREATININE 1.04* 0.84 1.09*  CALCIUM 9.2 9.6 8.8*   GFR: Estimated Creatinine Clearance: 59.3 mL/min (A) (by C-G formula based on SCr of 1.09 mg/dL (H)). Liver Function Tests: No results for input(s): "AST", "ALT", "ALKPHOS", "BILITOT", "PROT", "ALBUMIN" in the last 168 hours. No results for input(s): "LIPASE", "AMYLASE" in the last 168 hours. No results for input(s): "AMMONIA" in the last 168 hours. Coagulation Profile: No results for input(s): "INR", "PROTIME" in the last 168 hours. Cardiac Enzymes: No results for input(s): "CKTOTAL", "CKMB", "CKMBINDEX", "TROPONINI" in the last 168 hours. BNP (last 3 results) No results for input(s): "PROBNP" in the last 8760 hours. HbA1C: No results for input(s): "HGBA1C" in the last 72 hours. CBG: No results for input(s): "GLUCAP" in the last 168 hours. Lipid Profile: No results for input(s): "CHOL", "HDL", "LDLCALC", "TRIG", "CHOLHDL", "LDLDIRECT" in the last 72 hours. Thyroid Function Tests: No results for input(s): "TSH", "T4TOTAL", "FREET4", "T3FREE", "THYROIDAB" in the last 72 hours. Anemia Panel: No results for input(s): "VITAMINB12", "FOLATE", "FERRITIN", "TIBC", "IRON", "RETICCTPCT" in the last 72 hours. Sepsis Labs: Recent Labs  Lab 11/21/23 1346  PROCALCITON 0.55    Recent Results (from the past 240 hours)  Resp panel by RT-PCR (RSV, Flu A&B, Covid) Anterior Nasal Swab     Status: None   Collection Time: 11/19/23  1:50 PM   Specimen: Anterior Nasal Swab  Result Value Ref Range Status   SARS Coronavirus 2 by RT PCR NEGATIVE NEGATIVE Final    Comment: (NOTE) SARS-CoV-2 target nucleic acids are NOT DETECTED.  The SARS-CoV-2 RNA is generally detectable in upper respiratory specimens during the acute phase of infection. The lowest concentration of SARS-CoV-2 viral copies this assay can detect is 138 copies/mL. A negative result does not preclude SARS-Cov-2 infection and should not be  used as the sole basis for treatment or other patient management decisions. A negative result may occur with  improper specimen collection/handling, submission of specimen other than nasopharyngeal swab, presence of viral mutation(s) within the areas targeted by this assay, and inadequate number of viral copies(<138 copies/mL). A negative result must be combined with clinical observations, patient history, and epidemiological information. The expected result is Negative.  Fact Sheet for Patients:  BloggerCourse.com  Fact Sheet for Healthcare Providers:  SeriousBroker.it  This test is no t yet approved or cleared by the [place] FDA and  has been authorized for detection and/or diagnosis of SARS-CoV-2 by FDA under an Emergency Use Authorization (EUA). This EUA will remain  in effect (meaning this test can be used) for the duration of the COVID-19 declaration under Section 564(b)(1) of the Act, 21 U.S.C.section 360bbb-3(b)(1), unless the authorization is terminated  or revoked sooner.       Influenza A by PCR NEGATIVE NEGATIVE Final   Influenza B by PCR NEGATIVE NEGATIVE Final  Comment: (NOTE) The Xpert Xpress SARS-CoV-2/FLU/RSV plus assay is intended as an aid in the diagnosis of influenza from Nasopharyngeal swab specimens and should not be used as a sole basis for treatment. Nasal washings and aspirates are unacceptable for Xpert Xpress SARS-CoV-2/FLU/RSV testing.  Fact Sheet for Patients: BloggerCourse.com  Fact Sheet for Healthcare Providers: SeriousBroker.it  This test is not yet approved or cleared by the [place] FDA and has been authorized for detection and/or diagnosis of SARS-CoV-2 by FDA under an Emergency Use Authorization (EUA). This EUA will remain in effect (meaning this test can be used) for the duration of the COVID-19 declaration under Section  564(b)(1) of the Act, 21 U.S.C. section 360bbb-3(b)(1), unless the authorization is terminated or revoked.     Resp Syncytial Virus by PCR NEGATIVE NEGATIVE Final    Comment: (NOTE) Fact Sheet for Patients: BloggerCourse.com  Fact Sheet for Healthcare Providers: SeriousBroker.it  This test is not yet approved or cleared by the [place] FDA and has been authorized for detection and/or diagnosis of SARS-CoV-2 by FDA under an Emergency Use Authorization (EUA). This EUA will remain in effect (meaning this test can be used) for the duration of the COVID-19 declaration under Section 564(b)(1) of the Act, 21 U.S.C. section 360bbb-3(b)(1), unless the authorization is terminated or revoked.  Performed at [facility], [address], [place], [place] [address]       Radiology Studies: No results found.    Scheduled Meds:  arformoterol  15 mcg Nebulization BID   budesonide (PULMICORT) nebulizer solution  0.5 mg Nebulization BID   diltiazem  180 mg Oral QHS   furosemide  40 mg Oral Daily   guaiFENesin  600 mg Oral BID   influenza vaccine adjuvanted  0.5 mL Intramuscular Tomorrow-1000   ipratropium-albuterol  3 mL Nebulization TID   loratadine  10 mg Oral Daily   nicotine  21 mg Transdermal Daily   pravastatin  20 mg Oral QHS   predniSONE  40 mg Oral Q breakfast   revefenacin  175 mcg Nebulization Daily   rivaroxaban  20 mg Oral Q supper   Continuous Infusions:   LOS: 8 days   Time spent: 25 minutes   Noralee Stain, DO [facility] 11/27/2023, 1:51 PM   Available via Epic secure chat 7am-7pm After these hours, please refer to coverage provider listed on [email or web address]

## 2023-11-28 ENCOUNTER — Other Ambulatory Visit (HOSPITAL_COMMUNITY): Payer: Self-pay

## 2023-11-28 DIAGNOSIS — J441 Chronic obstructive pulmonary disease with (acute) exacerbation: Secondary | ICD-10-CM | POA: Diagnosis not present

## 2023-11-28 MED ORDER — ANORO ELLIPTA 62.5-25 MCG/ACT IN AEPB: 1.0000 | INHALATION_SPRAY | Freq: Every day | RESPIRATORY_TRACT | 1 refills | Status: DC

## 2023-11-28 MED ORDER — UMECLIDINIUM-VILANTEROL 62.5-25 MCG/ACT IN AEPB
1.0000 | INHALATION_SPRAY | Freq: Every day | RESPIRATORY_TRACT | 1 refills | Status: DC
  Filled 2023-11-28: qty 60, 60d supply, fill #0

## 2023-11-28 MED ORDER — NICOTINE 21 MG/24HR TD PT24: 21.0000 mg | MEDICATED_PATCH | Freq: Every day | TRANSDERMAL | 0 refills | Status: AC

## 2023-11-28 MED ORDER — PREDNISONE 10 MG PO TABS
ORAL_TABLET | ORAL | 0 refills | Status: DC
  Filled 2023-11-28: qty 32, 16d supply, fill #0

## 2023-11-28 NOTE — Progress Notes (Signed)
 TOC meds delivered in a secure bag to pt in room by this RN- granddaughter in room helping pt dress- pt requested a diaper for ride home

## 2023-11-28 NOTE — Progress Notes (Signed)
 AVS reviewed w/ pt  who verbalized an understanding- pt states "I am not going to stop talking so I don't need to were the nicotine patch" Pt verbalized the risks of smoling w/ O2- and stated she "never has O2 on when she smokes."Pt to call pulmonologist about anora inhaler- it was covered in past at [facility]- - not covered by insurance today. PIV removed as noted. Granddaughter enroute to hospital.

## 2023-11-28 NOTE — Care Management Important Message (Signed)
 Important Message  Patient Details IM Letter given. Name: Sherri Hart MRN: [id number] Date of Birth: 01-27-1946   Important Message Given:  Yes - Medicare IM     Caren Macadam 11/28/2023, 11:11 AM

## 2023-11-28 NOTE — Plan of Care (Signed)
  Problem: Education: Goal: Knowledge of General Education information will improve Description: Including pain rating scale, medication(s)/side effects and non-pharmacologic comfort measures 11/28/2023 1258 by Westley Foots, RN Outcome: Adequate for Discharge 11/28/2023 [phone] by Westley Foots, RN Outcome: Progressing   Problem: Health Behavior/Discharge Planning: Goal: Ability to manage health-related needs will improve 11/28/2023 1258 by Westley Foots, RN Outcome: Adequate for Discharge 11/28/2023 0944 by Westley Foots, RN Outcome: Progressing   Problem: Clinical Measurements: Goal: Ability to maintain clinical measurements within normal limits will improve 11/28/2023 1258 by Westley Foots, RN Outcome: Adequate for Discharge 11/28/2023 0944 by Westley Foots, RN Outcome: Progressing Goal: Will remain free from infection 11/28/2023 1258 by Westley Foots, RN Outcome: Adequate for Discharge 11/28/2023 0944 by Westley Foots, RN Outcome: Progressing Goal: Diagnostic test results will improve 11/28/2023 1258 by Westley Foots, RN Outcome: Adequate for Discharge 11/28/2023 0944 by Westley Foots, RN Outcome: Progressing Goal: Respiratory complications will improve 11/28/2023 1258 by Westley Foots, RN Outcome: Adequate for Discharge 11/28/2023 0944 by Westley Foots, RN Outcome: Progressing Goal: Cardiovascular complication will be avoided 11/28/2023 1258 by Westley Foots, RN Outcome: Adequate for Discharge 11/28/2023 0944 by Westley Foots, RN Outcome: Progressing   Problem: Activity: Goal: Risk for activity intolerance will decrease 11/28/2023 1258 by Westley Foots, RN Outcome: Adequate for Discharge 11/28/2023 [phone] by Westley Foots, RN Outcome: Progressing   Problem: Nutrition: Goal: Adequate nutrition will be maintained 11/28/2023 1258 by Westley Foots, RN Outcome:  Adequate for Discharge 11/28/2023 0944 by Westley Foots, RN Outcome: Progressing   Problem: Coping: Goal: Level of anxiety will decrease 11/28/2023 1258 by Westley Foots, RN Outcome: Adequate for Discharge 11/28/2023 0944 by Westley Foots, RN Outcome: Progressing   Problem: Elimination: Goal: Will not experience complications related to bowel motility 11/28/2023 1258 by Westley Foots, RN Outcome: Adequate for Discharge 11/28/2023 0944 by Westley Foots, RN Outcome: Progressing Goal: Will not experience complications related to urinary retention 11/28/2023 1258 by Westley Foots, RN Outcome: Adequate for Discharge 11/28/2023 0944 by Westley Foots, RN Outcome: Progressing   Problem: Pain Managment: Goal: General experience of comfort will improve and/or be controlled 11/28/2023 1258 by Westley Foots, RN Outcome: Adequate for Discharge 11/28/2023 0944 by Westley Foots, RN Outcome: Progressing   Problem: Safety: Goal: Ability to remain free from injury will improve 11/28/2023 1258 by Westley Foots, RN Outcome: Adequate for Discharge 11/28/2023 0944 by Westley Foots, RN Outcome: Progressing   Problem: Skin Integrity: Goal: Risk for impaired skin integrity will decrease 11/28/2023 1258 by Westley Foots, RN Outcome: Adequate for Discharge 11/28/2023 0944 by Westley Foots, RN Outcome: Progressing

## 2023-11-28 NOTE — TOC Transition Note (Addendum)
 Transition of Care [facility]) - Discharge Note   Patient Details  Name: Sherri Hart MRN: [id number] Date of Birth: Nov 18, 1945  Transition of Care [facility]) CM/SW Contact:  Larrie Kass, LCSW Phone Number: 11/28/2023, 12:05 PM   Clinical Narrative:    Received a message pt's granddaughter has made payment arrangement. CSW spoke with Mitch with [employer] he is requesting home O2 orders. Pt's portable tank will be delivered to pt's room prior to d/c. No further TOC needs TOC sign off.    Final next level of care: Home/Self Care    Patient Goals and CMS Choice Patient states their goals for this hospitalization and ongoing recovery are:: return home          Discharge Placement                    Patient and family notified of of transfer: 11/28/23  Discharge Plan and Services Additional resources added to the After Visit Summary for                                       Social Drivers of Health (SDOH) Interventions SDOH Screenings   Food Insecurity: No Food Insecurity (11/20/2023)  Housing: Low Risk  (11/20/2023)  Transportation Needs: No Transportation Needs (11/20/2023)  Utilities: Not At Risk (11/20/2023)  Social Connections: Socially Isolated (11/20/2023)  Tobacco Use: High Risk (11/19/2023)     Readmission Risk Interventions     No data to display

## 2023-11-28 NOTE — Discharge Summary (Signed)
 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  She was requiring 10 L HFNC and was treated with steroids and inhaled bronchodilators.  Patient was also diuresed with Lasix.  She continued to make improvements.  She was set up with home oxygen for discharge.  Discharge Diagnoses:   Principal Problem:   COPD with acute exacerbation (HCC) Active Problems:   Nicotine dependence, cigarettes, uncomplicated   Acute on chronic diastolic CHF (congestive heart failure) (HCC)   Acute on chronic respiratory failure with hypoxia and hypercapnia (HCC)   Pulmonary hypertension (HCC)   Chronic pain   Paroxysmal atrial fibrillation (HCC)   COPD exacerbation Acute on chronic hypoxic respiratory failure -Currently requiring 3 L [place] O2 -Completed 3-day course of azithromycin.  Continue prednisone taper, breathing treatment -Follow-up with outpatient pulmonology for PFTs and sleep study -Home oxygen ordered   Pulmonary hypertension, acute on chronic diastolic heart failure -Lasix PO    Chronic back pain -Is now off narcotic pain medication for the past several years, after dealing with addiction issues in [place]   Paroxysmal A-fib -Xarelto, Cardizem   Leukocytosis -In setting of steroid use   Tobacco abuse -Nicotine patch    Hyperlipidemia -Pravachol  Discharge Instructions  Discharge Instructions     (HEART FAILURE PATIENTS) Call MD:  Anytime you have any of the following symptoms: 1) 3 pound weight gain in 24 hours or 5 pounds in 1 week 2) shortness of breath, with or without a dry hacking cough 3) swelling in the hands, feet or stomach 4) if you have to sleep on extra pillows at night in order to breathe.   Complete by: As directed    Call MD for:  difficulty breathing, headache or visual disturbances   Complete by: As directed    Call MD for:  extreme fatigue   Complete by: As directed    Call MD for:  persistant dizziness or light-headedness   Complete by: As directed    Call MD for:  persistant nausea and vomiting   Complete by: As directed    Call MD for:  severe uncontrolled pain   Complete by: As directed    Call MD for:  temperature >100.4   Complete by: As directed    Diet - low sodium heart healthy   Complete by: As directed    Discharge instructions   Complete by: As directed    You were cared for by a hospitalist during your hospital stay. If you have any questions about your discharge medications or the care you received while you were in the hospital after you are discharged, you can call the unit and ask to speak with the hospitalist on call if the hospitalist that took care of you is not available. Once you are discharged, your primary care physician will handle any further medical issues. Please note that NO REFILLS for any discharge medications will be authorized once you  are discharged, as it is imperative that you return to your primary care physician (or establish a relationship with a primary care physician if you do not have one) for your aftercare needs so that they can reassess your need for medications and monitor your lab values.   Increase activity slowly   Complete by: As directed       Allergies as of 11/28/2023       Reactions   Aspirin Other (See Comments)   Stomach  ache    Penicillins Swelling, Other (See Comments)   Swollen lips and welts + Pt stated that she can take keflex++ Did it involve swelling of the face/tongue/throat, SOB, or low BP? Yes Did it involve sudden or severe rash/hives, skin peeling, or any reaction on the inside of your mouth or nose? NO Did you need to seek medical attention at a hospital or doctor's office? Y When did it last happen? childhood        Medication List     STOP taking these medications    NICODERM CQ TD   nicotine 14 mg/24hr patch Commonly known as: NICODERM CQ - dosed in mg/24 hours Replaced by: nicotine 21 mg/24hr patch       TAKE these medications    albuterol 108 (90 Base) MCG/ACT inhaler Commonly known as: VENTOLIN HFA Inhale 2 puffs into the lungs every 6 (six) hours as needed for wheezing or shortness of breath.   alendronate 70 MG tablet Commonly known as: FOSAMAX Take 70 mg by mouth every Friday. Take with a full glass of water on an empty stomach.   Dilt-XR 180 MG 24 hr capsule Generic drug: diltiazem Take 180 mg by mouth at bedtime.   fluticasone 50 MCG/ACT nasal spray Commonly known as: FLONASE Place 2 sprays into both nostrils daily. What changed:  when to take this reasons to take this   furosemide 40 MG tablet Commonly known as: LASIX Take 1 tablet (40 mg total) by mouth daily. What changed: Another medication with the same name was removed. Continue taking this medication, and follow the directions you see here.   guaiFENesin 600 MG 12 hr tablet Commonly known as: MUCINEX Take 1 tablet (600 mg total) by mouth 2 (two) times daily. What changed: when to take this   ipratropium-albuterol 0.5-2.5 (3) MG/3ML Soln Commonly known as: DUONEB Take 3 mLs by nebulization every 6 (six) hours as needed. What changed: reasons to take this   loratadine 10 MG tablet Commonly known as: CLARITIN Take 10 mg by mouth daily as needed for allergies or rhinitis.   nicotine 21  mg/24hr patch Commonly known as: NICODERM CQ - dosed in mg/24 hours Place 1 patch (21 mg total) onto the skin daily. Start taking on: November 29, 2023 Replaces: nicotine 14 mg/24hr patch   OXYGEN Inhale 2 L/min into the lungs continuous.   pravastatin 20 MG tablet Commonly known as: PRAVACHOL Take 20 mg by mouth at bedtime.   predniSONE 10 MG tablet Commonly known as: DELTASONE Take 4 tabs for 3 days, then 3 tabs for 3 days, then 2 tabs for 3 days, then 1 tab for 3 days, then 1/2 tab for 4 days.   Symbicort 80-4.5 MCG/ACT inhaler Generic drug: budesonide-formoterol Inhale 2 puffs into the lungs in the morning and at bedtime.   Tylenol 325 MG tablet Generic drug: acetaminophen Take 325-650 mg by mouth every 6 (six) hours as needed for mild pain (pain score 1-3) or headache.   umeclidinium-vilanterol 62.5-25  MCG/ACT Aepb Commonly known as: ANORO ELLIPTA Inhale 1 puff into the lungs daily.   Xarelto 20 MG Tabs tablet Generic drug: rivaroxaban TAKE 1 TABLET(20 MG) BY MOUTH DAILY WITH SUPPER What changed: See the new instructions.               Durable Medical Equipment  (From admission, onward)           Start     Ordered   11/28/23 0942  For home use only DME oxygen  Once       Question Answer Comment  Length of Need Lifetime   Mode or (Route) Nasal cannula   Liters per Minute 3   Frequency Continuous (stationary and portable oxygen unit needed)   Oxygen conserving device Yes   Oxygen delivery system Gas      11/28/23 0941            Follow-up Information     Ellyn Hack, MD Follow up.   Specialty: Family Medicine Contact information: [address] [place] [address] [phone]         Tomma Lightning, MD. Nyra Capes on 01/05/2024.   Specialty: Pulmonary Disease Contact information: [address] [place] [address] [phone]                Allergies  Allergen Reactions   Aspirin Other (See Comments)     Stomach ache    Penicillins Swelling and Other (See Comments)    Swollen lips and welts + Pt stated that she can take keflex++ Did it involve swelling of the face/tongue/throat, SOB, or low BP? Yes Did it involve sudden or severe rash/hives, skin peeling, or any reaction on the inside of your mouth or nose? NO Did you need to seek medical attention at a hospital or doctor's office? Y When did it last happen? childhood    Consultations: Pulmonology    Procedures/Studies: DG CHEST PORT 1 VIEW Result Date: 11/21/2023 CLINICAL DATA:  Acute hypoxic respiratory failure EXAM: PORTABLE CHEST 1 VIEW COMPARISON:  11/19/2023 FINDINGS: Single frontal view of the chest demonstrates continued enlargement the cardiac silhouette. There is progressive pulmonary vascular congestion, with mild basilar predominant ground-glass airspace disease. Denser consolidation in the retrocardiac region left lower lobe could reflect airspace disease or atelectasis. No large effusion or pneumothorax. No acute bony abnormalities. IMPRESSION: 1. Findings most consistent with congestive heart failure and worsening volume status. 2. Denser consolidation within the retrocardiac region left lower lobe, which could reflect atelectasis or airspace disease. Electronically Signed   By: Sharlet Salina M.D.   On: 11/21/2023 18:00   ECHOCARDIOGRAM COMPLETE Result Date: 11/21/2023    ECHOCARDIOGRAM REPORT   Patient Name:   [facility] BLOCK Date of Exam: 11/21/2023 Medical Rec #:  [id number]          Height:       66.0 in Accession #:    [id number]         Weight:       268.7 lb Date of Birth:  06-Nov-1945           BSA:          2.267 m Patient Age:    77 years           BP:           136/72 mmHg Patient Gender: F                  HR:  95 bpm. Exam Location:  Inpatient Procedure: 2D Echo, Cardiac Doppler and Color Doppler (Both Spectral and Color            Flow Doppler were utilized during procedure). Indications:    Dyspnea  History:         Patient has prior history of Echocardiogram examinations, most                 recent 11/29/2022. CHF, COPD, Arrythmias:Atrial Fibrillation;                 Risk Factors:Current Smoker.  Sonographer:    Amy Chionchio Referring Phys: [id number] ADEWALE A OLALERE IMPRESSIONS  1. Left ventricular ejection fraction, by estimation, is 60 to 65%. The left ventricle has normal function. The left ventricle has no regional wall motion abnormalities. There is mild concentric left ventricular hypertrophy. Left ventricular diastolic parameters are indeterminate.  2. Right ventricular systolic function is mildly reduced. The right ventricular size is mildly enlarged. There is moderately elevated pulmonary artery systolic pressure. The estimated right ventricular systolic pressure is 47.3 mmHg.  3. Left atrial size was severely dilated.  4. Right atrial size was severely dilated.  5. The mitral valve is normal in structure. Trivial mitral valve regurgitation. No evidence of mitral stenosis.  6. The aortic valve is tricuspid. There is mild calcification of the aortic valve. Aortic valve regurgitation is not visualized. Aortic valve sclerosis/calcification is present, without any evidence of aortic stenosis.  7. The inferior vena cava is dilated in size with <50% respiratory variability, suggesting right atrial pressure of 15 mmHg. FINDINGS  Left Ventricle: Left ventricular ejection fraction, by estimation, is 60 to 65%. The left ventricle has normal function. The left ventricle has no regional wall motion abnormalities. The left ventricular internal cavity size was normal in size. There is  mild concentric left ventricular hypertrophy. Left ventricular diastolic parameters are indeterminate. Right Ventricle: The right ventricular size is mildly enlarged. No increase in right ventricular wall thickness. Right ventricular systolic function is mildly reduced. There is moderately elevated pulmonary artery systolic pressure. The  tricuspid regurgitant velocity is 2.84 m/s, and with an assumed right atrial pressure of 15 mmHg, the estimated right ventricular systolic pressure is 47.3 mmHg. Left Atrium: Left atrial size was severely dilated. Right Atrium: Right atrial size was severely dilated. Pericardium: There is no evidence of pericardial effusion. Mitral Valve: The mitral valve is normal in structure. Trivial mitral valve regurgitation. No evidence of mitral valve stenosis. MV peak gradient, 5.5 mmHg. The mean mitral valve gradient is 3.0 mmHg. Tricuspid Valve: The tricuspid valve is normal in structure. Tricuspid valve regurgitation is mild . No evidence of tricuspid stenosis. Aortic Valve: The aortic valve is tricuspid. There is mild calcification of the aortic valve. Aortic valve regurgitation is not visualized. Aortic valve sclerosis/calcification is present, without any evidence of aortic stenosis. Aortic valve mean gradient measures 2.0 mmHg. Aortic valve peak gradient measures 4.1 mmHg. Aortic valve area, by VTI measures 2.91 cm. Pulmonic Valve: The pulmonic valve was normal in structure. Pulmonic valve regurgitation is trivial. No evidence of pulmonic stenosis. Aorta: The aortic root is normal in size and structure. Venous: The inferior vena cava is dilated in size with less than 50% respiratory variability, suggesting right atrial pressure of 15 mmHg. IAS/Shunts: No atrial level shunt detected by color flow Doppler.  LEFT VENTRICLE PLAX 2D LVIDd:         4.90 cm LVIDs:  3.70 cm LV PW:         1.20 cm LV IVS:        1.40 cm LVOT diam:     2.20 cm LV SV:         48 LV SV Index:   21 LVOT Area:     3.80 cm  RIGHT VENTRICLE          IVC RV Basal diam:  4.00 cm  IVC diam: 2.30 cm TAPSE (M-mode): 0.7 cm LEFT ATRIUM            Index        RIGHT ATRIUM           Index LA Vol (A2C): 71.7 ml  31.62 ml/m  RA Area:     33.40 cm LA Vol (A4C): 126.0 ml 55.57 ml/m  RA Volume:   117.00 ml 51.60 ml/m  AORTIC VALVE                     PULMONIC VALVE AV Area (Vmax):    3.14 cm     PV Vmax:       0.80 m/s AV Area (Vmean):   2.61 cm     PV Peak grad:  2.6 mmHg AV Area (VTI):     2.91 cm AV Vmax:           101.00 cm/s AV Vmean:          73.000 cm/s AV VTI:            0.166 m AV Peak Grad:      4.1 mmHg AV Mean Grad:      2.0 mmHg LVOT Vmax:         83.30 cm/s LVOT Vmean:        50.200 cm/s LVOT VTI:          0.127 m LVOT/AV VTI ratio: 0.77  AORTA Ao Root diam: 2.90 cm Ao Asc diam:  3.50 cm MITRAL VALVE              TRICUSPID VALVE MV Area (PHT): 3.93 cm   TR Peak grad:   32.3 mmHg MV Area VTI:   2.87 cm   TR Vmax:        284.00 cm/s MV Peak grad:  5.5 mmHg MV Mean grad:  3.0 mmHg   SHUNTS MV Vmax:       1.17 m/s   Systemic VTI:  0.13 m MV Vmean:      79.4 cm/s  Systemic Diam: 2.20 cm Arvilla Meres MD Electronically signed by Arvilla Meres MD Signature Date/Time: 11/21/2023/3:15:56 PM    Final    DG Chest Port 1 View Result Date: 11/19/2023 CLINICAL DATA:  Shortness of breath. EXAM: PORTABLE CHEST 1 VIEW COMPARISON:  Chest radiograph dated 04/24/2023. FINDINGS: Diffuse chronic interstitial coarsening. There are bibasilar atelectasis/scarring. Pneumonia is less likely. No consolidative changes. No pleural effusion or pneumothorax. Mild cardiomegaly. Atherosclerotic calcification of the aorta. No acute osseous pathology. IMPRESSION: Bibasilar atelectasis/scarring. Pneumonia is less likely. Electronically Signed   By: Elgie Collard M.D.   On: 11/19/2023 16:25       Discharge Exam: Vitals:   11/28/23 0500 11/28/23 0753  BP:    Pulse:    Resp:    Temp:    SpO2: 92% 93%    General: Pt is alert, awake, not in acute distress Cardiovascular: RRR, S1/S2 +, nonpitting +1 edema Respiratory: CTA bilaterally, no wheezing, no rhonchi, no respiratory distress,  no conversational dyspnea, on nasal cannula O2 Abdominal: Soft, NT, ND, bowel sounds + Extremities: Nonpitting +1 edema, no cyanosis Psych: Normal mood and affect, stable  judgement and insight     The results of significant diagnostics from this hospitalization (including imaging, microbiology, ancillary and laboratory) are listed below for reference.     Microbiology: Recent Results (from the past 240 hours)  Resp panel by RT-PCR (RSV, Flu A&B, Covid) Anterior Nasal Swab     Status: None   Collection Time: 11/19/23  1:50 PM   Specimen: Anterior Nasal Swab  Result Value Ref Range Status   SARS Coronavirus 2 by RT PCR NEGATIVE NEGATIVE Final    Comment: (NOTE) SARS-CoV-2 target nucleic acids are NOT DETECTED.  The SARS-CoV-2 RNA is generally detectable in upper respiratory specimens during the acute phase of infection. The lowest concentration of SARS-CoV-2 viral copies this assay can detect is 138 copies/mL. A negative result does not preclude SARS-Cov-2 infection and should not be used as the sole basis for treatment or other patient management decisions. A negative result may occur with  improper specimen collection/handling, submission of specimen other than nasopharyngeal swab, presence of viral mutation(s) within the areas targeted by this assay, and inadequate number of viral copies(<138 copies/mL). A negative result must be combined with clinical observations, patient history, and epidemiological information. The expected result is Negative.  Fact Sheet for Patients:  BloggerCourse.com  Fact Sheet for Healthcare Providers:  SeriousBroker.it  This test is no t yet approved or cleared by the [place] FDA and  has been authorized for detection and/or diagnosis of SARS-CoV-2 by FDA under an Emergency Use Authorization (EUA). This EUA will remain  in effect (meaning this test can be used) for the duration of the COVID-19 declaration under Section 564(b)(1) of the Act, 21 U.S.C.section 360bbb-3(b)(1), unless the authorization is terminated  or revoked sooner.       Influenza A by PCR  NEGATIVE NEGATIVE Final   Influenza B by PCR NEGATIVE NEGATIVE Final    Comment: (NOTE) The Xpert Xpress SARS-CoV-2/FLU/RSV plus assay is intended as an aid in the diagnosis of influenza from Nasopharyngeal swab specimens and should not be used as a sole basis for treatment. Nasal washings and aspirates are unacceptable for Xpert Xpress SARS-CoV-2/FLU/RSV testing.  Fact Sheet for Patients: BloggerCourse.com  Fact Sheet for Healthcare Providers: SeriousBroker.it  This test is not yet approved or cleared by the [place] FDA and has been authorized for detection and/or diagnosis of SARS-CoV-2 by FDA under an Emergency Use Authorization (EUA). This EUA will remain in effect (meaning this test can be used) for the duration of the COVID-19 declaration under Section 564(b)(1) of the Act, 21 U.S.C. section 360bbb-3(b)(1), unless the authorization is terminated or revoked.     Resp Syncytial Virus by PCR NEGATIVE NEGATIVE Final    Comment: (NOTE) Fact Sheet for Patients: BloggerCourse.com  Fact Sheet for Healthcare Providers: SeriousBroker.it  This test is not yet approved or cleared by the [place] FDA and has been authorized for detection and/or diagnosis of SARS-CoV-2 by FDA under an Emergency Use Authorization (EUA). This EUA will remain in effect (meaning this test can be used) for the duration of the COVID-19 declaration under Section 564(b)(1) of the Act, 21 U.S.C. section 360bbb-3(b)(1), unless the authorization is terminated or revoked.  Performed at [facility], [address], [place], [place] [address]      Labs: BNP (last 3 results) Recent Labs    03/01/23  0703 04/24/23 2100 11/20/23 0440  BNP 400.5* 165.6* 448.7*   Basic Metabolic Panel: Recent Labs  Lab 11/23/23 0922 11/25/23 1202 11/27/23 0423  NA 141 141 137  K 4.1 4.4 4.6   CL 98 95* 91*  CO2 31 34* 37*  GLUCOSE 149* 129* 172*  BUN 44* 35* 49*  CREATININE 1.04* 0.84 1.09*  CALCIUM 9.2 9.6 8.8*   Liver Function Tests: No results for input(s): "AST", "ALT", "ALKPHOS", "BILITOT", "PROT", "ALBUMIN" in the last 168 hours. No results for input(s): "LIPASE", "AMYLASE" in the last 168 hours. No results for input(s): "AMMONIA" in the last 168 hours. CBC: Recent Labs  Lab 11/27/23 0423  WBC 12.7*  HGB 13.6  HCT 44.7  MCV 104.2*  PLT 201   Cardiac Enzymes: No results for input(s): "CKTOTAL", "CKMB", "CKMBINDEX", "TROPONINI" in the last 168 hours. BNP: Invalid input(s): "POCBNP" CBG: No results for input(s): "GLUCAP" in the last 168 hours. D-Dimer No results for input(s): "DDIMER" in the last 72 hours. Hgb A1c No results for input(s): "HGBA1C" in the last 72 hours. Lipid Profile No results for input(s): "CHOL", "HDL", "LDLCALC", "TRIG", "CHOLHDL", "LDLDIRECT" in the last 72 hours. Thyroid function studies No results for input(s): "TSH", "T4TOTAL", "T3FREE", "THYROIDAB" in the last 72 hours.  Invalid input(s): "FREET3" Anemia work up No results for input(s): "VITAMINB12", "FOLATE", "FERRITIN", "TIBC", "IRON", "RETICCTPCT" in the last 72 hours. Urinalysis    Component Value Date/Time   COLORURINE YELLOW 02/26/2023 1031   APPEARANCEUR CLEAR 02/26/2023 1031   LABSPEC 1.024 02/26/2023 1031   PHURINE 5.0 02/26/2023 1031   GLUCOSEU NEGATIVE 02/26/2023 1031   HGBUR NEGATIVE 02/26/2023 1031   BILIRUBINUR NEGATIVE 02/26/2023 1031   KETONESUR NEGATIVE 02/26/2023 1031   PROTEINUR 30 (A) 02/26/2023 1031   NITRITE POSITIVE (A) 02/26/2023 1031   LEUKOCYTESUR NEGATIVE 02/26/2023 1031   Sepsis Labs Recent Labs  Lab 11/27/23 0423  WBC 12.7*   Microbiology Recent Results (from the past 240 hours)  Resp panel by RT-PCR (RSV, Flu A&B, Covid) Anterior Nasal Swab     Status: None   Collection Time: 11/19/23  1:50 PM   Specimen: Anterior Nasal Swab   Result Value Ref Range Status   SARS Coronavirus 2 by RT PCR NEGATIVE NEGATIVE Final    Comment: (NOTE) SARS-CoV-2 target nucleic acids are NOT DETECTED.  The SARS-CoV-2 RNA is generally detectable in upper respiratory specimens during the acute phase of infection. The lowest concentration of SARS-CoV-2 viral copies this assay can detect is 138 copies/mL. A negative result does not preclude SARS-Cov-2 infection and should not be used as the sole basis for treatment or other patient management decisions. A negative result may occur with  improper specimen collection/handling, submission of specimen other than nasopharyngeal swab, presence of viral mutation(s) within the areas targeted by this assay, and inadequate number of viral copies(<138 copies/mL). A negative result must be combined with clinical observations, patient history, and epidemiological information. The expected result is Negative.  Fact Sheet for Patients:  BloggerCourse.com  Fact Sheet for Healthcare Providers:  SeriousBroker.it  This test is no t yet approved or cleared by the [place] FDA and  has been authorized for detection and/or diagnosis of SARS-CoV-2 by FDA under an Emergency Use Authorization (EUA). This EUA will remain  in effect (meaning this test can be used) for the duration of the COVID-19 declaration under Section 564(b)(1) of the Act, 21 U.S.C.section 360bbb-3(b)(1), unless the authorization is terminated  or revoked sooner.  Influenza A by PCR NEGATIVE NEGATIVE Final   Influenza B by PCR NEGATIVE NEGATIVE Final    Comment: (NOTE) The Xpert Xpress SARS-CoV-2/FLU/RSV plus assay is intended as an aid in the diagnosis of influenza from Nasopharyngeal swab specimens and should not be used as a sole basis for treatment. Nasal washings and aspirates are unacceptable for Xpert Xpress SARS-CoV-2/FLU/RSV testing.  Fact Sheet for  Patients: BloggerCourse.com  Fact Sheet for Healthcare Providers: SeriousBroker.it  This test is not yet approved or cleared by the [place] FDA and has been authorized for detection and/or diagnosis of SARS-CoV-2 by FDA under an Emergency Use Authorization (EUA). This EUA will remain in effect (meaning this test can be used) for the duration of the COVID-19 declaration under Section 564(b)(1) of the Act, 21 U.S.C. section 360bbb-3(b)(1), unless the authorization is terminated or revoked.     Resp Syncytial Virus by PCR NEGATIVE NEGATIVE Final    Comment: (NOTE) Fact Sheet for Patients: BloggerCourse.com  Fact Sheet for Healthcare Providers: SeriousBroker.it  This test is not yet approved or cleared by the [place] FDA and has been authorized for detection and/or diagnosis of SARS-CoV-2 by FDA under an Emergency Use Authorization (EUA). This EUA will remain in effect (meaning this test can be used) for the duration of the COVID-19 declaration under Section 564(b)(1) of the Act, 21 U.S.C. section 360bbb-3(b)(1), unless the authorization is terminated or revoked.  Performed at [facility], [address] [place], [place] [address]      Patient was seen and examined on the day of discharge and was found to be in stable condition. Time coordinating discharge: 35 minutes including assessment and coordination of care, as well as examination of the patient.   SIGNED:  Noralee Stain, DO [facility] 11/28/2023, 11:10 AM

## 2023-11-28 NOTE — Progress Notes (Signed)
 Mobility Specialist - Progress Note  (HFNC 3L) Pre-mobility: 109 bpm HR, 92%SpO2 Post-mobility: 118 bpm HR, BP, 89% SPO2   11/28/23 0921  Mobility  Activity Transferred from bed to chair  Level of Assistance Contact guard assist, steadying assist  Assistive Device Other (Comment) (HHA)  Range of Motion/Exercises Active Assistive  Activity Response Tolerated well  Mobility Referral Yes  Mobility visit 1 Mobility  Mobility Specialist Start Time (ACUTE ONLY) [id number]  Mobility Specialist Stop Time (ACUTE ONLY) [id number]  Mobility Specialist Time Calculation (min) (ACUTE ONLY) 25 min   Pt was found in bed and agreeable to ambulate. Had x1 STS and afterwards stated feeling too unwell to ambulate. Pt transferred to recliner chair and left with all needs met. Call bell in reach.  Billey Chang Mobility Specialist

## 2023-11-28 NOTE — Plan of Care (Signed)

## 2024-01-05 ENCOUNTER — Ambulatory Visit: Admitting: Pulmonary Disease

## 2024-02-21 ENCOUNTER — Inpatient Hospital Stay (HOSPITAL_COMMUNITY)
Admission: EM | Admit: 2024-02-21 | Discharge: 2024-03-02 | DRG: 291 | Disposition: A | Discharge status: Home / Self Care | Attending: Internal Medicine | Admitting: Internal Medicine

## 2024-02-21 ENCOUNTER — Encounter (HOSPITAL_COMMUNITY): Payer: Self-pay

## 2024-02-21 ENCOUNTER — Emergency Department (HOSPITAL_COMMUNITY)

## 2024-02-21 ENCOUNTER — Other Ambulatory Visit: Payer: Self-pay

## 2024-02-21 DIAGNOSIS — M25572 Pain in left ankle and joints of left foot: Secondary | ICD-10-CM | POA: Diagnosis present

## 2024-02-21 DIAGNOSIS — Z9981 Dependence on supplemental oxygen: Secondary | ICD-10-CM | POA: Diagnosis not present

## 2024-02-21 DIAGNOSIS — J9601 Acute respiratory failure with hypoxia: Secondary | ICD-10-CM | POA: Diagnosis not present

## 2024-02-21 DIAGNOSIS — N179 Acute kidney failure, unspecified: Secondary | ICD-10-CM | POA: Diagnosis present

## 2024-02-21 DIAGNOSIS — M1990 Unspecified osteoarthritis, unspecified site: Secondary | ICD-10-CM | POA: Diagnosis present

## 2024-02-21 DIAGNOSIS — I493 Ventricular premature depolarization: Secondary | ICD-10-CM | POA: Diagnosis present

## 2024-02-21 DIAGNOSIS — E66813 Obesity, class 3: Secondary | ICD-10-CM | POA: Diagnosis present

## 2024-02-21 DIAGNOSIS — I501 Left ventricular failure: Secondary | ICD-10-CM | POA: Diagnosis present

## 2024-02-21 DIAGNOSIS — Z825 Family history of asthma and other chronic lower respiratory diseases: Secondary | ICD-10-CM

## 2024-02-21 DIAGNOSIS — J441 Chronic obstructive pulmonary disease with (acute) exacerbation: Secondary | ICD-10-CM | POA: Diagnosis present

## 2024-02-21 DIAGNOSIS — E785 Hyperlipidemia, unspecified: Secondary | ICD-10-CM | POA: Diagnosis present

## 2024-02-21 DIAGNOSIS — Z7901 Long term (current) use of anticoagulants: Secondary | ICD-10-CM

## 2024-02-21 DIAGNOSIS — I5033 Acute on chronic diastolic (congestive) heart failure: Secondary | ICD-10-CM | POA: Diagnosis present

## 2024-02-21 DIAGNOSIS — G928 Other toxic encephalopathy: Secondary | ICD-10-CM | POA: Diagnosis not present

## 2024-02-21 DIAGNOSIS — J4489 Other specified chronic obstructive pulmonary disease: Secondary | ICD-10-CM | POA: Diagnosis present

## 2024-02-21 DIAGNOSIS — Z886 Allergy status to analgesic agent status: Secondary | ICD-10-CM

## 2024-02-21 DIAGNOSIS — F17210 Nicotine dependence, cigarettes, uncomplicated: Secondary | ICD-10-CM | POA: Diagnosis present

## 2024-02-21 DIAGNOSIS — Z96651 Presence of right artificial knee joint: Secondary | ICD-10-CM | POA: Diagnosis present

## 2024-02-21 DIAGNOSIS — I4821 Permanent atrial fibrillation: Secondary | ICD-10-CM | POA: Diagnosis present

## 2024-02-21 DIAGNOSIS — G8929 Other chronic pain: Secondary | ICD-10-CM | POA: Diagnosis present

## 2024-02-21 DIAGNOSIS — I480 Paroxysmal atrial fibrillation: Secondary | ICD-10-CM | POA: Diagnosis present

## 2024-02-21 DIAGNOSIS — J9621 Acute and chronic respiratory failure with hypoxia: Secondary | ICD-10-CM | POA: Diagnosis present

## 2024-02-21 DIAGNOSIS — Z880 Allergy status to penicillin: Secondary | ICD-10-CM

## 2024-02-21 DIAGNOSIS — Z6841 Body Mass Index (BMI) 40.0 and over, adult: Secondary | ICD-10-CM

## 2024-02-21 DIAGNOSIS — R6510 Systemic inflammatory response syndrome (SIRS) of non-infectious origin without acute organ dysfunction: Secondary | ICD-10-CM | POA: Diagnosis present

## 2024-02-21 DIAGNOSIS — Z7951 Long term (current) use of inhaled steroids: Secondary | ICD-10-CM

## 2024-02-21 DIAGNOSIS — Z7983 Long term (current) use of bisphosphonates: Secondary | ICD-10-CM | POA: Diagnosis not present

## 2024-02-21 DIAGNOSIS — I50811 Acute right heart failure: Secondary | ICD-10-CM | POA: Diagnosis present

## 2024-02-21 DIAGNOSIS — Z79899 Other long term (current) drug therapy: Secondary | ICD-10-CM

## 2024-02-21 LAB — COMPREHENSIVE METABOLIC PANEL WITH GFR
ALT: 11 U/L (ref 0–44)
AST: 18 U/L (ref 15–41)
Albumin: 3.9 g/dL (ref 3.5–5.0)
Alkaline Phosphatase: 60 U/L (ref 38–126)
Anion gap: 12 (ref 5–15)
BUN: 20 mg/dL (ref 8–23)
CO2: 26 mmol/L (ref 22–32)
Calcium: 9.4 mg/dL (ref 8.9–10.3)
Chloride: 102 mmol/L (ref 98–111)
Creatinine, Ser: 1.06 mg/dL — ABNORMAL HIGH (ref 0.44–1.00)
GFR, Estimated: 55 mL/min — ABNORMAL LOW (ref >60)
Glucose, Bld: 110 mg/dL — ABNORMAL HIGH (ref 70–99)
Potassium: 4.2 mmol/L (ref 3.5–5.1)
Sodium: 140 mmol/L (ref 135–145)
Total Bilirubin: 0.9 mg/dL (ref 0.0–1.2)
Total Protein: 7.2 g/dL (ref 6.5–8.1)

## 2024-02-21 LAB — CBC WITH DIFFERENTIAL/PLATELET
Abs Immature Granulocytes: 0.02 10*3/uL (ref 0.00–0.07)
Basophils Absolute: 0 10*3/uL (ref 0.0–0.1)
Basophils Relative: 0 %
Eosinophils Absolute: 0.2 10*3/uL (ref 0.0–0.5)
Eosinophils Relative: 3 %
HCT: 44.8 % (ref 36.0–46.0)
Hemoglobin: 13.8 g/dL (ref 12.0–15.0)
Immature Granulocytes: 0 %
Lymphocytes Relative: 22 %
Lymphs Abs: 1.6 10*3/uL (ref 0.7–4.0)
MCH: 32.9 pg (ref 26.0–34.0)
MCHC: 30.8 g/dL (ref 30.0–36.0)
MCV: 106.9 fL — ABNORMAL HIGH (ref 80.0–100.0)
Monocytes Absolute: 0.4 10*3/uL (ref 0.1–1.0)
Monocytes Relative: 5 %
Neutro Abs: 5.2 10*3/uL (ref 1.7–7.7)
Neutrophils Relative %: 70 %
Platelets: 181 10*3/uL (ref 150–400)
RBC: 4.19 MIL/uL (ref 3.87–5.11)
RDW: 14.7 % (ref 11.5–15.5)
WBC: 7.4 10*3/uL (ref 4.0–10.5)
nRBC: 0 % (ref 0.0–0.2)

## 2024-02-21 LAB — TROPONIN I (HIGH SENSITIVITY)
Troponin I (High Sensitivity): 12 ng/L (ref <18)
Troponin I (High Sensitivity): 11 ng/L (ref <18)

## 2024-02-21 LAB — BRAIN NATRIURETIC PEPTIDE: B Natriuretic Peptide: 210.5 pg/mL — ABNORMAL HIGH (ref 0.0–100.0)

## 2024-02-21 LAB — MRSA NEXT GEN BY PCR, NASAL: MRSA by PCR Next Gen: NOT DETECTED

## 2024-02-21 MED ORDER — SODIUM CHLORIDE 0.9 % IV SOLN: 250.0000 mL | INTRAVENOUS | Status: AC | PRN

## 2024-02-21 MED ORDER — IPRATROPIUM-ALBUTEROL 0.5-2.5 (3) MG/3ML IN SOLN
3.0000 mL | Freq: Once | RESPIRATORY_TRACT | Status: AC
  Administered 2024-02-21: 3 mL via RESPIRATORY_TRACT
  Filled 2024-02-21: qty 3

## 2024-02-21 MED ORDER — LORATADINE 10 MG PO TABS
10.0000 mg | ORAL_TABLET | Freq: Every day | ORAL | Status: DC | PRN
  Administered 2024-02-24 – 2024-03-01 (×4): 10 mg via ORAL
  Filled 2024-02-21 (×5): qty 1

## 2024-02-21 MED ORDER — DILTIAZEM HCL ER COATED BEADS 180 MG PO CP24
180.0000 mg | ORAL_CAPSULE | Freq: Every day | ORAL | Status: DC
  Administered 2024-02-21 – 2024-03-01 (×8): 180 mg via ORAL
  Filled 2024-02-21 (×10): qty 1

## 2024-02-21 MED ORDER — MORPHINE SULFATE (PF) 2 MG/ML IV SOLN
2.0000 mg | INTRAVENOUS | Status: DC | PRN
  Administered 2024-02-21 – 2024-02-25 (×16): 2 mg via INTRAVENOUS
  Filled 2024-02-21 (×26): qty 1

## 2024-02-21 MED ORDER — GUAIFENESIN ER 600 MG PO TB12
600.0000 mg | ORAL_TABLET | Freq: Two times a day (BID) | ORAL | Status: DC
  Administered 2024-02-21 – 2024-03-02 (×16): 600 mg via ORAL
  Filled 2024-02-21 (×21): qty 1

## 2024-02-21 MED ORDER — SODIUM CHLORIDE 0.9% FLUSH
3.0000 mL | Freq: Two times a day (BID) | INTRAVENOUS | Status: DC
  Administered 2024-02-21 – 2024-03-02 (×15): 3 mL via INTRAVENOUS

## 2024-02-21 MED ORDER — METHYLPREDNISOLONE SODIUM SUCC 40 MG IJ SOLR
40.0000 mg | Freq: Two times a day (BID) | INTRAMUSCULAR | Status: DC
  Administered 2024-02-21 – 2024-02-22 (×2): 40 mg via INTRAVENOUS
  Filled 2024-02-21 (×2): qty 1

## 2024-02-21 MED ORDER — RIVAROXABAN 20 MG PO TABS
20.0000 mg | ORAL_TABLET | Freq: Every day | ORAL | Status: DC
  Administered 2024-02-21 – 2024-03-01 (×8): 20 mg via ORAL
  Filled 2024-02-21 (×11): qty 1

## 2024-02-21 MED ORDER — PRAVASTATIN SODIUM 40 MG PO TABS
20.0000 mg | ORAL_TABLET | Freq: Every day | ORAL | Status: DC
  Administered 2024-02-21 – 2024-03-01 (×8): 20 mg via ORAL
  Filled 2024-02-21 (×10): qty 1

## 2024-02-21 MED ORDER — NICOTINE 21 MG/24HR TD PT24
21.0000 mg | MEDICATED_PATCH | Freq: Every day | TRANSDERMAL | Status: DC
  Administered 2024-02-21 – 2024-03-01 (×8): 21 mg via TRANSDERMAL
  Filled 2024-02-21 (×10): qty 1

## 2024-02-21 MED ORDER — FUROSEMIDE 10 MG/ML IJ SOLN
40.0000 mg | Freq: Two times a day (BID) | INTRAMUSCULAR | Status: DC
  Administered 2024-02-22: 40 mg via INTRAVENOUS
  Filled 2024-02-21: qty 4

## 2024-02-21 MED ORDER — ONDANSETRON HCL 4 MG/2ML IJ SOLN: 4.0000 mg | Freq: Four times a day (QID) | INTRAMUSCULAR | Status: DC | PRN

## 2024-02-21 MED ORDER — FUROSEMIDE 10 MG/ML IJ SOLN
60.0000 mg | Freq: Once | INTRAMUSCULAR | Status: AC
  Administered 2024-02-21: 60 mg via INTRAVENOUS
  Filled 2024-02-21: qty 6

## 2024-02-21 MED ORDER — SODIUM CHLORIDE 0.9% FLUSH: 3.0000 mL | INTRAVENOUS | Status: DC | PRN

## 2024-02-21 MED ORDER — FLUTICASONE FUROATE-VILANTEROL 100-25 MCG/ACT IN AEPB
1.0000 | INHALATION_SPRAY | Freq: Every day | RESPIRATORY_TRACT | Status: DC
  Administered 2024-02-24 – 2024-03-02 (×7): 1 via RESPIRATORY_TRACT
  Filled 2024-02-21: qty 28

## 2024-02-21 MED ORDER — UMECLIDINIUM-VILANTEROL 62.5-25 MCG/ACT IN AEPB: 1.0000 | INHALATION_SPRAY | Freq: Every day | RESPIRATORY_TRACT | Status: DC

## 2024-02-21 MED ORDER — FENTANYL CITRATE PF 50 MCG/ML IJ SOSY
50.0000 ug | PREFILLED_SYRINGE | Freq: Once | INTRAMUSCULAR | Status: AC
  Administered 2024-02-21: 50 ug via INTRAVENOUS
  Filled 2024-02-21: qty 1

## 2024-02-21 MED ORDER — ALBUTEROL SULFATE (2.5 MG/3ML) 0.083% IN NEBU
3.0000 mL | INHALATION_SOLUTION | Freq: Four times a day (QID) | RESPIRATORY_TRACT | Status: DC | PRN
  Administered 2024-02-22: 3 mL via RESPIRATORY_TRACT
  Filled 2024-02-21 (×4): qty 3

## 2024-02-21 NOTE — ED Triage Notes (Signed)
 PT BIB [facility] from home, CC SOB (3 days) and bilateral lower extremity edema (2 weeks). PT on 2L baseline O2 at 88-90% saturation. PT states provider doubled furosemide  recently without change in edema. Received 5mg  albuterol , solumedrol 125mg  and 2mg  Mag en route. Currently in Afib. Aox4.   HR: 70-110, BP 140/90, RR 30, O2 94% on 5L,T 98.2 oral, CBG 98

## 2024-02-21 NOTE — ED Provider Notes (Signed)
 [place] EMERGENCY DEPARTMENT AT [facility] Provider Note   CSN: [id number] Arrival date & time: 02/21/24  1454     Patient presents with: Shortness of Breath   Sherri Hart is a 78 y.o. female.   Patient here with shortness of breath.  History of COPD CHF A-fib on Xarelto .  She has noticed increased leg swelling, increased wheezing.  She wears 2 L of oxygen at times.  Has been having to wear her oxygen here recently.  Very hard to get around without feeling short of breath.  Hard to lay flat due to shortness of breath but also some chronic back pain.  She denies any fever or chills.  No chest pain.  Feels a lot of swelling in her legs.  They have recently increased her diuretic she feels like she is peeing all the time.  The history is provided by the patient.       Prior to Admission medications   Medication Sig Start Date End Date Taking? Authorizing Provider  albuterol  (VENTOLIN  HFA) 108 (90 Base) MCG/ACT inhaler Inhale 2 puffs into the lungs every 6 (six) hours as needed for wheezing or shortness of breath. 09/10/21   Bonanno, Marianne E, MD  alendronate (FOSAMAX) 70 MG tablet Take 70 mg by mouth every Friday. Take with a full glass of water  on an empty stomach.    [provider]  DILT-XR 180 MG 24 hr capsule Take 180 mg by mouth at bedtime. 12/17/22   [provider]  fluticasone  (FLONASE ) 50 MCG/ACT nasal spray Place 2 sprays into both nostrils daily. Patient taking differently: Place 2 sprays into both nostrils daily as needed for allergies or rhinitis. 03/02/23 11/19/23  Ezenduka, Nkeiruka J, MD  furosemide  (LASIX ) 40 MG tablet Take 1 tablet (40 mg total) by mouth daily. 02/04/21   Gonfa, Taye T, MD  guaiFENesin  (MUCINEX ) 600 MG 12 hr tablet Take 1 tablet (600 mg total) by mouth 2 (two) times daily. Patient taking differently: Take 600 mg by mouth in the morning and at bedtime. 09/10/21   Garlin Junker, MD  ipratropium-albuterol  (DUONEB)  0.5-2.5 (3) MG/3ML SOLN Take 3 mLs by nebulization every 6 (six) hours as needed. Patient taking differently: Take 3 mLs by nebulization every 6 (six) hours as needed (for wheezing or shortness of breath). 03/01/23   Ezenduka, Nkeiruka J, MD  loratadine  (CLARITIN ) 10 MG tablet Take 10 mg by mouth daily as needed for allergies or rhinitis.    [provider]  nicotine  (NICODERM CQ  - DOSED IN MG/24 HOURS) 21 mg/24hr patch Place 1 patch (21 mg total) onto the skin daily. 11/29/23   Daren Eck, DO  OXYGEN Inhale 2 L/min into the lungs continuous.    [provider]  pravastatin  (PRAVACHOL ) 20 MG tablet Take 20 mg by mouth at bedtime.    [provider]  predniSONE  (DELTASONE ) 10 MG tablet Take 4 tabs for 3 days, then 3 tabs for 3 days, then 2 tabs for 3 days, then 1 tab for 3 days, then 1/2 tab for 4 days. 11/28/23   Daren Eck, DO  rivaroxaban  (XARELTO ) 20 MG TABS tablet TAKE 1 TABLET(20 MG) BY MOUTH DAILY WITH SUPPER Patient taking differently: Take 20 mg by mouth daily with supper. 11/01/22   Hilty, Aviva Lemmings, MD  SYMBICORT 80-4.5 MCG/ACT inhaler Inhale 2 puffs into the lungs in the morning and at bedtime. 01/30/23   [provider]  TYLENOL  325 MG tablet Take 325-650 mg  by mouth every 6 (six) hours as needed for mild pain (pain score 1-3) or headache.    [provider]  umeclidinium-vilanterol (ANORO ELLIPTA ) 62.5-25 MCG/ACT AEPB Inhale 1 puff into the lungs daily. 11/28/23   Daren Eck, DO    Allergies: Aspirin and Penicillins    Review of Systems  Updated Vital Signs BP 122/73 (BP Location: Left Arm)   Pulse (!) 106   Temp 98.8 F (37.1 C) (Axillary)   Resp 18   Ht 5' 6 (1.676 m)   Wt 113.4 kg   SpO2 95%   BMI 40.35 kg/m   Physical Exam Vitals and nursing note reviewed.  Constitutional:      General: She is not in acute distress.    Appearance: She is well-developed. She is not ill-appearing.  HENT:     Head: Normocephalic and  atraumatic.   Eyes:     Conjunctiva/sclera: Conjunctivae normal.     Pupils: Pupils are equal, round, and reactive to light.    Cardiovascular:     Rate and Rhythm: Tachycardia present. Rhythm irregular.     Heart sounds: No murmur heard. Pulmonary:     Effort: Tachypnea present. No respiratory distress.     Breath sounds: Decreased breath sounds and wheezing present.  Abdominal:     Palpations: Abdomen is soft.     Tenderness: There is no abdominal tenderness.   Musculoskeletal:        General: No swelling.     Cervical back: Neck supple.     Right lower leg: Edema present.     Left lower leg: Edema present.   Skin:    General: Skin is warm and dry.     Capillary Refill: Capillary refill takes less than 2 seconds.   Neurological:     Mental Status: She is alert.   Psychiatric:        Mood and Affect: Mood normal.     (all labs ordered are listed, but only abnormal results are displayed) Labs Reviewed  CBC WITH DIFFERENTIAL/PLATELET - Abnormal; Notable for the following components:      Result Value   MCV 106.9 (*)    All other components within normal limits  COMPREHENSIVE METABOLIC PANEL WITH GFR - Abnormal; Notable for the following components:   Glucose, Bld 110 (*)    Creatinine, Ser 1.06 (*)    GFR, Estimated 55 (*)    All other components within normal limits  BRAIN NATRIURETIC PEPTIDE - Abnormal; Notable for the following components:   B Natriuretic Peptide 210.5 (*)    All other components within normal limits  TROPONIN I (HIGH SENSITIVITY)  TROPONIN I (HIGH SENSITIVITY)    EKG: EKG Interpretation Date/Time:  Saturday February 21 2024 15:08:34 EDT Ventricular Rate:  113 PR Interval:    QRS Duration:  97 QT Interval:  371 QTC Calculation: 488 R Axis:   55  Text Interpretation: Atrial fibrillation Nonspecific T abnormalities, lateral leads Borderline prolonged QT interval Confirmed by Lowery Rue [phone]) on 02/21/2024 3:17:39 PM  Radiology: Lenell Query  Chest Portable 1 View Result Date: 02/21/2024 CLINICAL DATA:  Shortness of breath EXAM: PORTABLE CHEST 1 VIEW COMPARISON:  11/21/2023 FINDINGS: Hardware in the cervical spine. Cardiomegaly with vascular congestion and mild interstitial opacity probably due to mild edema. Possible trace left effusion. Subsegmental atelectasis at the left base. No consolidation or pneumothorax. IMPRESSION: Cardiomegaly with vascular congestion and mild interstitial opacity probably due to mild edema. Possible trace left effusion. Electronically Signed   By:  Esmeralda Hedge M.D.   On: 02/21/2024 15:43     Procedures   Medications Ordered in the ED  furosemide  (LASIX ) injection 60 mg (has no administration in time range)  ipratropium-albuterol  (DUONEB) 0.5-2.5 (3) MG/3ML nebulizer solution 3 mL (has no administration in time range)  fentaNYL  (SUBLIMAZE ) injection 50 mcg (50 mcg Intravenous Given 02/21/24 1551)                                    Medical Decision Making Amount and/or Complexity of Data Reviewed Labs: ordered. Radiology: ordered.  Risk Prescription drug management. Decision regarding hospitalization.   Abreanna Drawdy Block is here with shortness of breath.  History of A-fib on Xarelto , CHF COPD.  Differential diagnosis exacerbation of COPD is CHF.  Seems less likely to be ACS pneumonia.  No concern for dissection.  Will check CBC BMP BNP troponin EKG chest x-ray.  She has already been given Solu-Medrol  magnesium  breathing treatment with EMS and feeling better.  She is on 3 L of oxygen here.  She has signs of volume overload on exam.  Diminished breath sounds with rhonchi and wheezing.  EKG shows rate controlled atrial fibrillation.  No obvious ischemic changes.  I reviewed interpreted EKG.  Per my review and interpretation labs no significant leukocytosis anemia or electrolyte abnormality.  BNP is 210 creatinine is 1.  Chest x-ray shows some cardiomegaly with vascular congestion and likely edema and  effusion.  Clinically I think she is volume overloaded but also likely with COPD exacerbation.  She has mildly increased work of breathing but seems to have some improvement with breathing treatments and Lasix  here.  I think she would benefit from observation stay will admit to medicine for further care.  This chart was dictated using voice recognition software.  Despite best efforts to proofread,  errors can occur which can change the documentation meaning.      Final diagnoses:  Acute respiratory failure with hypoxia (HCC)  Acute right-sided congestive heart failure [facility])    ED Discharge Orders     None          Lowery Rue, DO 02/21/24 1640

## 2024-02-21 NOTE — ED Notes (Signed)
 Pt requesting cough medicine. States she takes mucinex  at home and would like the same here. Primary RN aware.

## 2024-02-21 NOTE — H&P (Signed)
 History and Physical    Patient: Sherri Hart WUJ:[id number] DOB: 1946/01/15 DOA: 02/21/2024 DOS: the patient was seen and examined on 02/21/2024 PCP: Ulysees Gander, MD  Patient coming from: Home  Chief Complaint:  Chief Complaint  Patient presents with   Shortness of Breath   HPI: Sherri Hart is a 78 y.o. female with medical history significant of atrial fibrillation on Xarelto , COPD, heart failure with preserved ejection fraction, chronic respiratory failure on 2 L of oxygen on and off at home, prior history of pneumonia who presented to the ER with progressive shortness of breath, increased leg swelling and wheezing.  This has started couple days ago but worse today.  Patient has been getting increased shortness of breath with any mobility.  Has had some PND and orthopnea.  No fever or chills.  Patient is on Lasix  and will recently have the dose increased.  Continue to have leg swelling also.  At this point she has been admitted with acute hypoxic respiratory failure possibly COPD exacerbation versus CHF exacerbation. Initial workup showed only minimal elevation of BNP at 210 but she is morbidly obese.  Chest x-ray showed cardiomegaly with vascular congestion and mild interstitial opacity as well as trace left effusion.  Review of Systems: As mentioned in the history of present illness. All other systems reviewed and are negative. Past Medical History:  Diagnosis Date   Anxiety    Arthritis    Atrial fibrillation (HCC)    Complication of anesthesia    used to get migraine headaches after surgery   Congestive heart failure (HCC)    COPD (chronic obstructive pulmonary disease) (HCC)    Dyspnea    if afib causes a problem   Pneumonia    Past Surgical History:  Procedure Laterality Date   BACK SURGERY     CHOLECYSTECTOMY     NECK SURGERY     REPLACEMENT TOTAL KNEE Right    TOTAL KNEE REVISION Right 06/27/2021   Procedure: TOTAL KNEE REVISION;  Surgeon: Liliane Rei, MD;  Location: [facility];  Service: Orthopedics;  Laterality: Right;   TUBAL LIGATION     Social History:  reports that she has been smoking cigarettes. She has a 26.5 pack-year smoking history. She has never used smokeless tobacco. She reports that she does not drink alcohol and does not use drugs.  Allergies  Allergen Reactions   Aspirin Other (See Comments)    Stomach ache    Penicillins Swelling and Other (See Comments)    Swollen lips and welts + Pt stated that she can take keflex ++ Did it involve swelling of the face/tongue/throat, SOB, or low BP? Yes Did it involve sudden or severe rash/hives, skin peeling, or any reaction on the inside of your mouth or nose? NO Did you need to seek medical attention at a hospital or doctor's office? Y When did it last happen? childhood    Family History  Problem Relation Age of Onset   COPD Other     Prior to Admission medications   Medication Sig Start Date End Date Taking? Authorizing Provider  albuterol  (VENTOLIN  HFA) 108 (90 Base) MCG/ACT inhaler Inhale 2 puffs into the lungs every 6 (six) hours as needed for wheezing or shortness of breath. 09/10/21  Yes Bonanno, Marianne E, MD  alendronate (FOSAMAX) 70 MG tablet Take 70 mg by mouth every Friday. Take with a full glass of water  on an empty stomach.   Yes [provider]  DILT-XR 180 MG  24 hr capsule Take 180 mg by mouth at bedtime. 12/17/22  Yes [provider]  furosemide  (LASIX ) 40 MG tablet Take 1 tablet (40 mg total) by mouth daily. 02/04/21  Yes Gonfa, Taye T, MD  guaiFENesin  (MUCINEX ) 600 MG 12 hr tablet Take 1 tablet (600 mg total) by mouth 2 (two) times daily. 09/10/21  Yes Garlin Junker, MD  ipratropium-albuterol  (DUONEB) 0.5-2.5 (3) MG/3ML SOLN Take 3 mLs by nebulization every 6 (six) hours as needed. 03/01/23  Yes Ezenduka, Nkeiruka J, MD  loratadine  (CLARITIN ) 10 MG tablet Take 10 mg by mouth daily as needed for allergies or rhinitis.   Yes [provider]  pravastatin  (PRAVACHOL ) 20 MG tablet Take 20 mg by mouth at bedtime.   Yes [provider]  Pseudoephedrine-Acetaminophen  (SINUS PO) Take 1 tablet by mouth 2 (two) times daily. Unknown OTC for sinus congestion   Yes [provider]  rivaroxaban  (XARELTO ) 20 MG TABS tablet TAKE 1 TABLET(20 MG) BY MOUTH DAILY WITH SUPPER 11/01/22  Yes Hilty, Aviva Lemmings, MD  SYMBICORT 80-4.5 MCG/ACT inhaler Inhale 2 puffs into the lungs in the morning and at bedtime. 01/30/23  Yes [provider]  TYLENOL  325 MG tablet Take 325-650 mg by mouth every 6 (six) hours as needed for mild pain (pain score 1-3) or headache.   Yes [provider]  nicotine  (NICODERM CQ  - DOSED IN MG/24 HOURS) 21 mg/24hr patch Place 1 patch (21 mg total) onto the skin daily. Patient not taking: Reported on 02/21/2024 11/29/23   Daren Eck, DO  OXYGEN Inhale 2 L/min into the lungs continuous.    [provider]  umeclidinium-vilanterol (ANORO ELLIPTA ) 62.5-25 MCG/ACT AEPB Inhale 1 puff into the lungs daily. Patient not taking: Reported on 02/21/2024 11/28/23   Daren Eck, DO    Physical Exam: Vitals:   02/21/24 1800 02/21/24 1930 02/21/24 1933 02/21/24 2008  BP: 125/81   116/70  Pulse: (!) 106 (!) 101    Resp: (!) 21 19  (!) 23  Temp:   98.9 F (37.2 C) 98 F (36.7 C)  TempSrc:   Oral Oral  SpO2: 95% (!) 86%  91%  Weight:      Height:       Constitutional: Acutely ill looking NAD, calm, comfortable Eyes: PERRL, lids and conjunctivae normal ENMT: Mucous membranes are moist. Posterior pharynx clear of any exudate or lesions.Normal dentition.  Neck: normal, supple, no masses, no thyromegaly Respiratory: Decreased air entry bilateral with some diffuse expiratory wheezing, no crackles. Normal respiratory effort. No accessory muscle use.  Cardiovascular: Irregularly irregular with tachycardia, no murmurs / rubs / gallops.  1+ extremity edema. 2+ pedal pulses. No carotid  bruits.  Abdomen: no tenderness, no masses palpated. No hepatosplenomegaly. Bowel sounds positive.  Musculoskeletal: Good range of motion, no joint swelling or tenderness, Skin: no rashes, lesions, ulcers. No induration Neurologic: CN 2-12 grossly intact. Sensation intact, DTR normal. Strength 5/5 in all 4.  Psychiatric: Normal judgment and insight. Alert and oriented x 3. Normal mood  Data Reviewed:  Blood pressure 125 atrial, pulse is 106, respiratory 21 and oxygen sat 86% on room air chest x-ray showed cardiomegaly with vascular congestion and mild interstitial edemaGlucose is 110 creatinine 1.06,  Assessment and Plan:  #1 acute hypoxic respiratory failure: Secondary to his CHF and COPD exacerbation.  Patient will be admitted to cardiac telemetry.  Will diurese, add steroids and breathing treatments.  Patient has recently had echocardiogram in March which shows normal EF.  Patient also has lower extremity edema.  This is still consistent with possible CHF.  #2 acute exacerbation of diastolic CHF: Diurese, continue other cardiac medications.  #3 atrial fibrillation with some mild RVR: On Xarelto .  Will continue.  Also on Cardizem .  Will continue.  She may need to be really on beta-blockers although her EF was normal.  #4 COPD with mild exacerbation: Will add steroids to the nebulizer.  May consider additional antibiotics but I think this is more CHF exacerbation.  #5 morbid obesity: Dietary counseling.  #6 tobacco abuse: Counseling provided and on nicotine  patch.  Will continue    Advance Care Planning:   Code Status: Full Code   Consults: None  Family Communication: No family at bedside  Severity of Illness: The appropriate patient status for this patient is INPATIENT. Inpatient status is judged to be reasonable and necessary in order to provide the required intensity of service to ensure the patient's safety. The patient's presenting symptoms, physical exam findings, and initial  radiographic and laboratory data in the context of their chronic comorbidities is felt to place them at high risk for further clinical deterioration. Furthermore, it is not anticipated that the patient will be medically stable for discharge from the hospital within 2 midnights of admission.   * I certify that at the point of admission it is my clinical judgment that the patient will require inpatient hospital care spanning beyond 2 midnights from the point of admission due to high intensity of service, high risk for further deterioration and high frequency of surveillance required.*  AuthorCarolin Chyle, MD 02/21/2024 8:28 PM  For on call review [email or web address].

## 2024-02-22 DIAGNOSIS — I5033 Acute on chronic diastolic (congestive) heart failure: Secondary | ICD-10-CM | POA: Diagnosis not present

## 2024-02-22 DIAGNOSIS — J441 Chronic obstructive pulmonary disease with (acute) exacerbation: Secondary | ICD-10-CM | POA: Diagnosis not present

## 2024-02-22 DIAGNOSIS — I480 Paroxysmal atrial fibrillation: Secondary | ICD-10-CM

## 2024-02-22 DIAGNOSIS — E785 Hyperlipidemia, unspecified: Secondary | ICD-10-CM

## 2024-02-22 DIAGNOSIS — F17210 Nicotine dependence, cigarettes, uncomplicated: Secondary | ICD-10-CM

## 2024-02-22 DIAGNOSIS — E66813 Obesity, class 3: Secondary | ICD-10-CM

## 2024-02-22 LAB — BASIC METABOLIC PANEL WITH GFR
Anion gap: 13 (ref 5–15)
BUN: 21 mg/dL (ref 8–23)
CO2: 27 mmol/L (ref 22–32)
Calcium: 8.9 mg/dL (ref 8.9–10.3)
Chloride: 100 mmol/L (ref 98–111)
Creatinine, Ser: 1.2 mg/dL — ABNORMAL HIGH (ref 0.44–1.00)
GFR, Estimated: 47 mL/min — ABNORMAL LOW (ref >60)
Glucose, Bld: 221 mg/dL — ABNORMAL HIGH (ref 70–99)
Potassium: 3.8 mmol/L (ref 3.5–5.1)
Sodium: 140 mmol/L (ref 135–145)

## 2024-02-22 MED ORDER — FUROSEMIDE 10 MG/ML IJ SOLN
60.0000 mg | Freq: Two times a day (BID) | INTRAMUSCULAR | Status: DC
  Administered 2024-02-22: 60 mg via INTRAVENOUS
  Filled 2024-02-22 (×3): qty 6

## 2024-02-22 MED ORDER — IPRATROPIUM-ALBUTEROL 0.5-2.5 (3) MG/3ML IN SOLN
3.0000 mL | Freq: Four times a day (QID) | RESPIRATORY_TRACT | Status: DC
  Administered 2024-02-22: 3 mL via RESPIRATORY_TRACT
  Filled 2024-02-22 (×3): qty 3

## 2024-02-22 MED ORDER — FUROSEMIDE 10 MG/ML IJ SOLN
20.0000 mg | Freq: Once | INTRAMUSCULAR | Status: AC
  Administered 2024-02-22: 20 mg via INTRAVENOUS
  Filled 2024-02-22: qty 2

## 2024-02-22 MED ORDER — EMPAGLIFLOZIN 10 MG PO TABS
10.0000 mg | ORAL_TABLET | Freq: Every day | ORAL | Status: DC
  Administered 2024-02-22 – 2024-03-02 (×8): 10 mg via ORAL
  Filled 2024-02-22 (×10): qty 1

## 2024-02-22 MED ORDER — MELATONIN 3 MG PO TABS
3.0000 mg | ORAL_TABLET | Freq: Once | ORAL | Status: AC
  Administered 2024-02-22: 3 mg via ORAL
  Filled 2024-02-22: qty 1

## 2024-02-22 MED ORDER — SPIRONOLACTONE 12.5 MG HALF TABLET
12.5000 mg | ORAL_TABLET | Freq: Every day | ORAL | Status: DC
  Administered 2024-02-22 – 2024-02-27 (×4): 12.5 mg via ORAL
  Filled 2024-02-22 (×6): qty 1

## 2024-02-22 NOTE — Assessment & Plan Note (Signed)
 Continue rate control with diltiazem  and anticoagulation with rivaroxaban .  Continue telemetry monitoring.

## 2024-02-22 NOTE — Assessment & Plan Note (Signed)
Smoking cessation counseling 

## 2024-02-22 NOTE — Progress Notes (Signed)
 Progress Note   Patient: Sherri Hart FAO:[id number] DOB: Apr 16, 1946 DOA: 02/21/2024     1 DOS: the patient was seen and examined on 02/22/2024   Brief hospital course: [name] was admitted to the hospital with the working diagnosis of heart failure exacerbation.   78 yo female with the past medical history of atrial fibrillation, COPD, heart failure, and obesity who presented with dyspnea. Reported worsening dyspnea, wheezing and lower extremity edema for the last 3 days prior to admission. Positive PND and orthopnea. Her symptoms were refractive to outpatient increased in furosemide  dose. EMS was called, she was found in respiratory distress, with 02 saturation 88 to 90% on 2 L/min per [place], she received steroids, bronchodilators and was transported to the ED.  On her initial physical examination her blood pressure was 125/81, HR 106 RR 21 and 02 saturation 86%. Lungs with decreased breath sounds and diffuse bilateral wheezing, heart with S1 and S2 present, irregularly irregular with no gallops, rubs or murmurs, abdomen with no distention and positive lower extremity edema.  Chest radiograph with hypoinflation and right rotation, positive bilateral hilar vascular congestion, with bilateral central interstitial infiltrates. No effusions  EKG 113 bpm, normal axis , normal intervals, qtc 488, atrial fibrillation rhythm with PAC and PVC, with no significant ST segment or T wave changes.    Assessment and Plan: * Acute on chronic diastolic CHF (congestive heart failure) (HCC) Echocardiogram with preserved LV systolic function with EF 60 to 65%, mild LVH, RV systolic function with mild reduction, RVSP 47,3 mmHg, LA and RA with severe dilatation,   Documented urine output is 3000 ml Systolic blood pressure is 110 mmHg range.   Plan to continue diuresis with furosemide , increase dose to 60 mg IV bid Will add SGLT 2 inh and spironolactone.  If blood pressure stable will add ARB.   Acute  hypoxemic respiratory failure, on chronic hypoxemia, due to acute cardiogenic pulmonary edema.  Continue diuresis with furosemide  and follow up on 02 monitoring.  02 saturation today is  91% on 3,5 L/min per [place]   Paroxysmal atrial fibrillation (HCC) Continue rate control with diltiazem  and anticoagulation with rivaroxaban .  Continue telemetry monitoring.   Dyslipidemia Continue with statin therapy   COPD with acute exacerbation (HCC) Clinically improved.  Will continue bronchodilator therapy, ICS and LABA.  Oxymetry monitoring and supplemental 02 per  to keep 02 saturation 92% or greater.   Obesity, class 3 Calculated BMI is 43,7   Osteoarthritis, PT and OT.   Nicotine  dependence, cigarettes, uncomplicated Smoking cessation counseling.     Subjective: Patient with mild improvement in her symptoms but not yet back to baseline, continue with dyspnea and significant edema   Physical Exam: Vitals:   02/21/24 2325 02/22/24 0350 02/22/24 0500 02/22/24 0804  BP: 96/79   128/78  Pulse: 94   86  Resp: 19     Temp: 98 F (36.7 C) (!) 97.3 F (36.3 C)  (!) 97.4 F (36.3 C)  TempSrc: Oral Oral  Oral  SpO2: 90%     Weight:   123 kg   Height:       Neurology awake and alert ENT with mild pallor Cardiovascular with S1 and S2 present, irregularly irregular with no gallops or rubs,  Respiratory with rales at bases bilaterally with no wheezing or rhonchi  Abdomen with no distention, non tender and soft Positive lower extremity edema ++++  Distal lower extremity discoloration  Bilateral hypertrophic knees.   Data Reviewed:  Family Communication: no family at the bedside   Disposition: Status is: Inpatient Remains inpatient appropriate because: IV diuresis   Planned Discharge Destination: Home    Author: Albertus Alt, MD 02/22/2024 10:21 AM  For on call review [email or web address].

## 2024-02-22 NOTE — Plan of Care (Signed)

## 2024-02-22 NOTE — Assessment & Plan Note (Signed)
 Clinically improved.  Will continue bronchodilator therapy, ICS and LABA.  Oxymetry monitoring and supplemental 02 per [place] to keep 02 saturation 92% or greater.

## 2024-02-22 NOTE — Progress Notes (Signed)
 Mobility Specialist Progress Note;   02/22/24 0911  Mobility  Activity Transferred from chair to bed  Level of Assistance Contact guard assist, steadying assist  Assistive Device Other (Comment) (HHA)  Distance Ambulated (ft) 8 ft  Activity Response Tolerated well  Mobility Referral Yes  Mobility visit 1 Mobility  Mobility Specialist Start Time (ACUTE ONLY) 0911  Mobility Specialist Stop Time (ACUTE ONLY) 0925  Mobility Specialist Time Calculation (min) (ACUTE ONLY) 14 min   Pt requesting assistance back to bed. Required MinG assistance via HHA to safely transfer pt from chair to bed. VSS throughout and no c/o when asked. Pt left sitting on EoB with all needs met, call bell in reach.   Janit Meline Mobility Specialist Please contact via SecureChat or [employer] [phone]

## 2024-02-22 NOTE — Assessment & Plan Note (Addendum)
 Calculated BMI is 43,7   Osteoarthritis, PT and OT.

## 2024-02-22 NOTE — Hospital Course (Addendum)
 [name] was admitted to the hospital with the working diagnosis of heart failure exacerbation.   78 yo female with the past medical history of atrial fibrillation, COPD, heart failure, and obesity who presented with dyspnea. Reported worsening dyspnea, wheezing and lower extremity edema for the last 3 days prior to admission. Positive PND and orthopnea. Her symptoms were refractive to outpatient increased in furosemide  dose. EMS was called, she was found in respiratory distress, with 02 saturation 88 to 90% on 2 L/min per [place], she received steroids, bronchodilators and was transported to the ED.  On her initial physical examination her blood pressure was 125/81, HR 106 RR 21 and 02 saturation 86%. Lungs with decreased breath sounds and diffuse bilateral wheezing, heart with S1 and S2 present, irregularly irregular with no gallops, rubs or murmurs, abdomen with no distention and positive lower extremity edema.  Chest radiograph with hypoinflation and right rotation, positive bilateral hilar vascular congestion, with bilateral central interstitial infiltrates. No effusions  EKG 113 bpm, normal axis , normal intervals, qtc 488, atrial fibrillation rhythm with PAC and PVC, with no significant ST segment or T wave changes.   06/16 responding well to diuresis.

## 2024-02-22 NOTE — Plan of Care (Signed)

## 2024-02-22 NOTE — Assessment & Plan Note (Signed)
 Continue with statin therapy.  ?

## 2024-02-22 NOTE — Assessment & Plan Note (Addendum)
 Echocardiogram with preserved LV systolic function with EF 60 to 65%, mild LVH, RV systolic function with mild reduction, RVSP 47,3 mmHg, LA and RA with severe dilatation,   Documented urine output is 3800 ml Systolic blood pressure is 110 mmHg range.   Continue diuresis with furosemide  60 mg IV bid On SGLT 2 inh and spironolactone.  If blood pressure and renal function stable will add ARB.   Acute hypoxemic respiratory failure, on chronic hypoxemia, due to acute cardiogenic pulmonary edema.  Continue diuresis with furosemide  and follow up on 02 monitoring.  02 saturation today is  92% on 3 L/min per [place]

## 2024-02-23 ENCOUNTER — Other Ambulatory Visit (HOSPITAL_COMMUNITY): Payer: Self-pay

## 2024-02-23 ENCOUNTER — Telehealth (HOSPITAL_COMMUNITY): Payer: Self-pay | Admitting: Pharmacy Technician

## 2024-02-23 DIAGNOSIS — I5033 Acute on chronic diastolic (congestive) heart failure: Secondary | ICD-10-CM | POA: Diagnosis not present

## 2024-02-23 DIAGNOSIS — E785 Hyperlipidemia, unspecified: Secondary | ICD-10-CM | POA: Diagnosis not present

## 2024-02-23 DIAGNOSIS — I480 Paroxysmal atrial fibrillation: Secondary | ICD-10-CM | POA: Diagnosis not present

## 2024-02-23 DIAGNOSIS — J441 Chronic obstructive pulmonary disease with (acute) exacerbation: Secondary | ICD-10-CM | POA: Diagnosis not present

## 2024-02-23 LAB — BASIC METABOLIC PANEL WITH GFR
Anion gap: 11 (ref 5–15)
BUN: 30 mg/dL — ABNORMAL HIGH (ref 8–23)
CO2: 31 mmol/L (ref 22–32)
Calcium: 8.7 mg/dL — ABNORMAL LOW (ref 8.9–10.3)
Chloride: 97 mmol/L — ABNORMAL LOW (ref 98–111)
Creatinine, Ser: 1.28 mg/dL — ABNORMAL HIGH (ref 0.44–1.00)
GFR, Estimated: 44 mL/min — ABNORMAL LOW (ref >60)
Glucose, Bld: 164 mg/dL — ABNORMAL HIGH (ref 70–99)
Potassium: 4.1 mmol/L (ref 3.5–5.1)
Sodium: 139 mmol/L (ref 135–145)

## 2024-02-23 LAB — MAGNESIUM: Magnesium: 2 mg/dL (ref 1.7–2.4)

## 2024-02-23 MED ORDER — IPRATROPIUM-ALBUTEROL 0.5-2.5 (3) MG/3ML IN SOLN
3.0000 mL | Freq: Two times a day (BID) | RESPIRATORY_TRACT | Status: DC
  Administered 2024-02-24: 3 mL via RESPIRATORY_TRACT
  Filled 2024-02-23: qty 3

## 2024-02-23 NOTE — Telephone Encounter (Signed)
 Patient Product/process development scientist completed.    The patient is insured through [employer]. Patient has Medicare and is not eligible for a copay card, but may be able to apply for patient assistance or Medicare RX Payment Plan (Patient Must reach out to their plan, if eligible for payment plan), if available.    Ran test claim for Jardiance 10 mg and the current 30 day co-pay is $12.15.   This test claim was processed through [facility]- copay amounts may vary at other pharmacies due to pharmacy/plan contracts, or as the patient moves through the different stages of their insurance plan.     Morgan Arab, CPHT Pharmacy Technician III Certified Patient Advocate [facility] Patient Advocate Team Direct Number: [phone]  Fax: [phone]

## 2024-02-23 NOTE — Progress Notes (Signed)
 Progress Note   Patient: Sherri Hart WUJ:[id number] DOB: 1946-02-02 DOA: 02/21/2024     2 DOS: the patient was seen and examined on 02/23/2024   Brief hospital course: [name] was admitted to the hospital with the working diagnosis of heart failure exacerbation.   78 yo female with the past medical history of atrial fibrillation, COPD, heart failure, and obesity who presented with dyspnea. Reported worsening dyspnea, wheezing and lower extremity edema for the last 3 days prior to admission. Positive PND and orthopnea. Her symptoms were refractive to outpatient increased in furosemide  dose. EMS was called, she was found in respiratory distress, with 02 saturation 88 to 90% on 2 L/min per [place], she received steroids, bronchodilators and was transported to the ED.  On her initial physical examination her blood pressure was 125/81, HR 106 RR 21 and 02 saturation 86%. Lungs with decreased breath sounds and diffuse bilateral wheezing, heart with S1 and S2 present, irregularly irregular with no gallops, rubs or murmurs, abdomen with no distention and positive lower extremity edema.  Chest radiograph with hypoinflation and right rotation, positive bilateral hilar vascular congestion, with bilateral central interstitial infiltrates. No effusions  EKG 113 bpm, normal axis , normal intervals, qtc 488, atrial fibrillation rhythm with PAC and PVC, with no significant ST segment or T wave changes.   06/16 responding well to diuresis.   Assessment and Plan: * Acute on chronic diastolic CHF (congestive heart failure) (HCC) Echocardiogram with preserved LV systolic function with EF 60 to 65%, mild LVH, RV systolic function with mild reduction, RVSP 47,3 mmHg, LA and RA with severe dilatation,   Documented urine output is 3800 ml Systolic blood pressure is 110 mmHg range.   Continue diuresis with furosemide  60 mg IV bid On SGLT 2 inh and spironolactone.  If blood pressure and renal function stable  will add ARB.   Acute hypoxemic respiratory failure, on chronic hypoxemia, due to acute cardiogenic pulmonary edema.  Continue diuresis with furosemide  and follow up on 02 monitoring.  02 saturation today is  92% on 3 L/min per [place]   Paroxysmal atrial fibrillation (HCC) Continue rate control with diltiazem  and anticoagulation with rivaroxaban .  Continue telemetry monitoring.   Dyslipidemia Continue with statin therapy   COPD with acute exacerbation (HCC) Clinically improved.  Will continue bronchodilator therapy, ICS and LABA.  Oxymetry monitoring and supplemental 02 per [place] to keep 02 saturation 92% or greater.   Obesity, class 3 Calculated BMI is 43,7   Osteoarthritis, PT and OT.   Nicotine  dependence, cigarettes, uncomplicated Smoking cessation counseling.       Subjective: patient with improvement in edema and dyspnea no chest pain, no palpitations   Physical Exam: Vitals:   02/23/24 0500 02/23/24 0737 02/23/24 0842 02/23/24 1109  BP:  118/65  117/72  Pulse:  80  81  Resp:  18  17  Temp:  97.6 F (36.4 C)  97.6 F (36.4 C)  TempSrc:  Oral  Oral  SpO2:  91% 98% 96%  Weight: 120.9 kg     Height:       Neurology awake and alert, deconditioned ENT With mild pallor Cardiovascular with S1 and S2 present, irregularly irregular with no gallops, or rubs, positive systolic murmur at the right lower sternal border Respiratory with no wheezing or rhonchi, positive rales at bases Abdomen with protuberant with no distention, non tender Positive lower extremity edema ++   Data Reviewed:    Family Communication: no family at the bedside  Disposition: Status is: Inpatient Remains inpatient appropriate because: IV diuresis   Planned Discharge Destination: Home     Author: Albertus Alt, MD 02/23/2024 11:47 AM  For on call review [email or web address].

## 2024-02-23 NOTE — Progress Notes (Addendum)
 Heart Failure Navigator Progress Note  Assessed for Heart & Vascular TOC clinic readiness.  Patient does not meet criteria due to EF 60-65%, No HF TOC per Dr. Sunnie England, has a [facility] follow up appointment on 03/10/2024.   Navigator will sign off at this time.   Randie Bustle, BSN, Scientist, clinical (histocompatibility and immunogenetics) Only

## 2024-02-23 NOTE — Plan of Care (Signed)
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
## 2024-02-23 NOTE — Progress Notes (Signed)
 Mobility Specialist Progress Note:    02/23/24 1024  Mobility  Activity Transferred from bed to chair  Level of Assistance Contact guard assist, steadying assist  Assistive Device Other (Comment) (HHA)  Distance Ambulated (ft) 3 ft  Activity Response Tolerated well  Mobility Referral Yes  Mobility visit 1 Mobility  Mobility Specialist Start Time (ACUTE ONLY) [id number]  Mobility Specialist Stop Time (ACUTE ONLY) 0936  Mobility Specialist Time Calculation (min) (ACUTE ONLY) 9 min   Pt recived in bed, agreeable to mobility. On 6L O2 upon entry. Required MinG via HHA to safely transfer pt from bed to chair. VSS on 6L O2. No c/o when asked. Pt left in chair. Personal belongings and call light within reach. All needs met.  [name] Mobility Specialist  Please contact via [employer] or  Rehab Office [phone]

## 2024-02-23 NOTE — TOC Initial Note (Signed)
 Transition of Care [facility]) - Initial/Assessment Note    Patient Details  Name: Sherri Hart MRN: [id number] Date of Birth: 1945/12/20  Transition of [facility] CM/SW Contact:    Jeani Mill, RN Phone Number: 02/23/2024, 1:29 PM  Clinical Narrative:                  Spoke to patient regarding transition needs.  Patient lives with roommates and Granddaughter transports her to apts and gets her groceries.  Patient has home 02 from [employer], requested portable tank for discharge. Patient can afford her prescriptions and her pharmacy is [facility] on [address]  Address, Phone number and PCP verified. Expected Discharge Plan: Home w Home Health Services Barriers to Discharge: Continued Medical Work up   Patient Goals and CMS Choice Patient states their goals for this hospitalization and ongoing recovery are:: Return home          Expected Discharge Plan and Services       Living arrangements for the past 2 months: Apartment                 DME Arranged: Oxygen DME Agency: [employer] Date DME Agency Contacted: 02/23/24 Time DME Agency Contacted: 1329 Representative spoke with at DME Agency: Zack            Prior Living Arrangements/Services Living arrangements for the past 2 months: Apartment Lives with:: Roommate Patient language and need for interpreter reviewed:: Yes Do you feel safe going back to the place where you live?: Yes      Need for Family Participation in Patient Care: Yes (Comment) Care giver support system in place?: Yes (comment) Current home services: DME (Home 02, walker, cane)    Activities of Daily Living   ADL Screening (condition at time of admission) Independently performs ADLs?: Yes (appropriate for developmental age) Is the patient deaf or have difficulty hearing?: Yes (L side) Does the patient have difficulty seeing, even when wearing glasses/contacts?: Yes Does the patient have difficulty concentrating, remembering, or making  decisions?: No  Permission Sought/Granted                  Emotional Assessment Appearance:: Appears stated age Attitude/Demeanor/Rapport: Engaged Affect (typically observed): Accepting Orientation: : Oriented to Situation, Oriented to  Time, Oriented to Place, Oriented to Self Alcohol / Substance Use: Not Applicable Psych Involvement: No (comment)  Admission diagnosis:  Acute right-sided congestive heart failure (HCC) [I50.811] Acute respiratory failure with hypoxia (HCC) [J96.01] Acute hypoxemic respiratory failure (HCC) [J96.01] Patient Active Problem List   Diagnosis Date Noted   Obesity, class 3 02/22/2024   Dyslipidemia 02/22/2024   Acute hypoxemic respiratory failure (HCC) 02/21/2024   Pulmonary hypertension (HCC) 11/26/2023   Chronic pain 11/26/2023   Paroxysmal atrial fibrillation (HCC) 11/26/2023   Acute on chronic respiratory failure with hypoxia and hypercapnia (HCC) 11/21/2023   CAP (community acquired pneumonia) 02/26/2023   Pressure injury of skin 07/01/2021   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   Status post revision of total knee replacement, 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   Atrial fibrillation with RVR (HCC) 02/01/2021   Acute on chronic diastolic CHF (congestive heart failure) (HCC) 07/05/2020   Elevated troponin level not due myocardial infarction 07/04/2020   Hypomagnesemia 07/04/2020   Atrial fibrillation with rapid ventricular response (HCC) 07/04/2020   Acute cardiogenic pulmonary edema (HCC) 07/04/2020   COPD with acute exacerbation (HCC) 07/04/2020   Nicotine  dependence, cigarettes, uncomplicated 07/04/2020   PCP:  Ulysees Gander, MD Pharmacy:  [id number] Jonette Nestle, [place] - [address] AT [facility] [address] & [facility] [address] [place] [address] Phone: [phone] Fax: [phone]  [place] [facility] [address] [place] [address] Phone: [phone] Fax:  [phone]     Social Drivers of Health (SDOH) Social History: SDOH Screenings   Food Insecurity: No Food Insecurity (02/21/2024)  Housing: Low Risk  (02/21/2024)  Transportation Needs: No Transportation Needs (02/21/2024)  Utilities: Not At Risk (02/21/2024)  Social Connections: Moderately Isolated (02/21/2024)  Tobacco Use: High Risk (02/21/2024)   SDOH Interventions:     Readmission Risk Interventions     No data to display

## 2024-02-24 DIAGNOSIS — J441 Chronic obstructive pulmonary disease with (acute) exacerbation: Secondary | ICD-10-CM | POA: Diagnosis not present

## 2024-02-24 DIAGNOSIS — I480 Paroxysmal atrial fibrillation: Secondary | ICD-10-CM | POA: Diagnosis not present

## 2024-02-24 DIAGNOSIS — I5033 Acute on chronic diastolic (congestive) heart failure: Secondary | ICD-10-CM | POA: Diagnosis not present

## 2024-02-24 DIAGNOSIS — N179 Acute kidney failure, unspecified: Secondary | ICD-10-CM

## 2024-02-24 LAB — MAGNESIUM: Magnesium: 2.1 mg/dL (ref 1.7–2.4)

## 2024-02-24 LAB — BASIC METABOLIC PANEL WITH GFR
Anion gap: 10 (ref 5–15)
BUN: 40 mg/dL — ABNORMAL HIGH (ref 8–23)
CO2: 35 mmol/L — ABNORMAL HIGH (ref 22–32)
Calcium: 8.7 mg/dL — ABNORMAL LOW (ref 8.9–10.3)
Chloride: 95 mmol/L — ABNORMAL LOW (ref 98–111)
Creatinine, Ser: 1.5 mg/dL — ABNORMAL HIGH (ref 0.44–1.00)
GFR, Estimated: 36 mL/min — ABNORMAL LOW (ref >60)
Glucose, Bld: 108 mg/dL — ABNORMAL HIGH (ref 70–99)
Potassium: 4.1 mmol/L (ref 3.5–5.1)
Sodium: 140 mmol/L (ref 135–145)

## 2024-02-24 MED ORDER — FUROSEMIDE 10 MG/ML IJ SOLN
80.0000 mg | Freq: Two times a day (BID) | INTRAMUSCULAR | Status: DC
  Administered 2024-02-24 – 2024-02-25 (×2): 80 mg via INTRAVENOUS
  Filled 2024-02-24 (×2): qty 8

## 2024-02-24 NOTE — Progress Notes (Signed)
 Progress Note   Patient: Sherri Hart [id number]:[id number] DOB: 1945/11/28 DOA: 02/21/2024     3 DOS: the patient was seen and examined on 02/24/2024   Brief hospital course: Sherri Hart was admitted to the hospital with the working diagnosis of heart failure exacerbation.   78 yo female with the past medical history of atrial fibrillation, COPD, heart failure, and obesity who presented with dyspnea. Reported worsening dyspnea, wheezing and lower extremity edema for the last 3 days prior to admission. Positive PND and orthopnea. Her symptoms were refractive to outpatient increased in furosemide  dose. EMS was called, she was found in respiratory distress, with 02 saturation 88 to 90% on 2 L/min per [place], she received steroids, bronchodilators and was transported to the ED.  On her initial physical examination her blood pressure was 125/81, HR 106 RR 21 and 02 saturation 86%. Lungs with decreased breath sounds and diffuse bilateral wheezing, heart with S1 and S2 present, irregularly irregular with no gallops, rubs or murmurs, abdomen with no distention and positive lower extremity edema.  Chest radiograph with hypoinflation and right rotation, positive bilateral hilar vascular congestion, with bilateral central interstitial infiltrates. No effusions  EKG 113 bpm, normal axis , normal intervals, qtc 488, atrial fibrillation rhythm with PAC and PVC, with no significant ST segment or T wave changes.   06/16 responding well to diuresis, but not back to baseline.  06/17 continue volume overloaded   Assessment and Plan: * Acute on chronic diastolic CHF (congestive heart failure) (HCC) Echocardiogram with preserved LV systolic function with EF 60 to 65%, mild LVH, RV systolic function with mild reduction, RVSP 47,3 mmHg, LA and RA with severe dilatation,   Documented urine output is 2,400 ml Systolic blood pressure is 110 mmHg range.   Persistent volume overload, will increase furosemide  to 80 mg IV  bid On SGLT 2 inh and spironolactone.  If blood pressure and renal function stable will add ARB.   Acute hypoxemic respiratory failure, on chronic hypoxemia, due to acute cardiogenic pulmonary edema.  Continue diuresis with furosemide  and follow up on 02 monitoring.  02 saturation today is  90% on 4 L/min per [facility]   Paroxysmal atrial fibrillation (HCC) Continue rate control with diltiazem  and anticoagulation with rivaroxaban .  Continue telemetry monitoring.   AKI (acute kidney injury) (HCC) Renal function with serum cr at 1,50 with K at 4,1 and serum bicarbonate at 35 Na 140 and Mg 2.1   Continue volume overloaded Will continue diuresis with furosemide , SGLT 2 ing and spironolactone Follow up renal function and electrolytes in am Avoid hypotension and nephrotoxic medications.   COPD with acute exacerbation (HCC) Clinically improved.  Continue bronchodilator therapy, ICS and LABA.  Oxymetry monitoring and supplemental 02 per [place] to keep 02 saturation 92% or greater.   Dyslipidemia Continue with statin therapy   Nicotine  dependence, cigarettes, uncomplicated Smoking cessation counseling.   Obesity, class 3 Calculated BMI is 43,7   Osteoarthritis, PT and OT.       Subjective: patient continue to have lowe extremity edema, not back to her baseline, her dyspnea has improved but also not back to baseline, no chest pain   Physical Exam: Vitals:   02/23/24 2321 02/24/24 0349 02/24/24 0730 02/24/24 1138  BP: 118/67 98/75 110/66 (!) 115/52  Pulse: 94  91 85  Resp: 20 20 19 17   Temp: 98.3 F (36.8 C) 98 F (36.7 C) 97.9 F (36.6 C) 97.6 F (36.4 C)  TempSrc: Oral Oral Oral Oral  SpO2:  91% 90% 93% 95%  Weight:  122.6 kg    Height:       Neurology awake and alert ENT with mild pallor Cardiovascular with S1 and S2 present, irregularly irregular with no gallops, rubs or murmurs No visible JVD (short wide neck)  Respiratory with mild rales at bases with no wheezing or  rhonchi,  Abdomen protuberant but non tender and not distended Positive lower extremity edema ++ to +++ component of lymphedema.  Hypertrophic knees.   Data Reviewed:    Family Communication: no family at the bedside   Disposition: Status is: Inpatient Remains inpatient appropriate because: IV diuresis   Planned Discharge Destination: Home     Author: Albertus Alt, MD 02/24/2024 1:20 PM  For on call review [email or web address].

## 2024-02-24 NOTE — Progress Notes (Signed)
 PT Cancellation Note  Patient Details Name: Sherri Hart MRN: [id number] DOB: 1946-06-18   Cancelled Treatment:    Reason Eval/Treat Not Completed: Fatigue/lethargy limiting ability to participate  Patient reporting awake all night and wanting to rest. Agrees to PT intervention later today.    Gayle Kava, PT Acute Rehabilitation Services  Office [phone]  Guilford Leep 02/24/2024, 9:18 AM

## 2024-02-24 NOTE — Plan of Care (Signed)
  Problem: Education: Goal: Knowledge of General Education information will improve Description: Including pain rating scale, medication(s)/side effects and non-pharmacologic comfort measures 02/24/2024 1723 by Climmie Damme, RN Outcome: Progressing 02/24/2024 1716 by Climmie Damme, RN Outcome: Progressing   Problem: Health Behavior/Discharge Planning: Goal: Ability to manage health-related needs will improve Outcome: Progressing   Problem: Clinical Measurements: Goal: Ability to maintain clinical measurements within normal limits will improve 02/24/2024 1723 by Climmie Damme, RN Outcome: Progressing 02/24/2024 1716 by Branson Calandra D, RN Outcome: Progressing Goal: Diagnostic test results will improve 02/24/2024 1723 by Climmie Damme, RN Outcome: Progressing 02/24/2024 1716 by Branson Calandra D, RN Outcome: Progressing Goal: Respiratory complications will improve 02/24/2024 1723 by Climmie Damme, RN Outcome: Progressing 02/24/2024 1716 by Branson Calandra D, RN Outcome: Progressing Goal: Cardiovascular complication will be avoided 02/24/2024 1723 by Climmie Damme, RN Outcome: Progressing 02/24/2024 1716 by Branson Calandra D, RN Outcome: Progressing    Problem: Nutrition: Goal: Adequate nutrition will be maintained 02/24/2024 1723 by Climmie Damme, RN Outcome: Progressing 02/24/2024 1716 by Branson Calandra D, RN Outcome: Progressing   Problem: Elimination: Goal: Will not experience complications related to bowel motility 02/24/2024 1723 by Climmie Damme, RN Outcome: Progressing 02/24/2024 1716 by Branson Calandra D, RN Outcome: Progressing Goal: Will not experience complications related to urinary retention 02/24/2024 1723 by Climmie Damme, RN Outcome: Progressing 02/24/2024 1716 by Branson Calandra D, RN Outcome: Progressing   Problem: Pain Managment: Goal: General experience of  comfort will improve and/or be controlled 02/24/2024 1723 by Climmie Damme, RN Outcome: Progressing 02/24/2024 1716 by Branson Calandra D, RN Outcome: Progressing   Problem: Safety: Goal: Ability to remain free from injury will improve 02/24/2024 1723 by Climmie Damme, RN Outcome: Progressing 02/24/2024 1716 by Climmie Damme, RN Outcome: Progressing

## 2024-02-24 NOTE — Care Management Important Message (Signed)
 Important Message  Patient Details  Name: Sherri Hart MRN: [id number] Date of Birth: Feb 13, 1946   Important Message Given:  Yes - Medicare IM     Wynonia Hedges 02/24/2024, 3:21 PM

## 2024-02-24 NOTE — Plan of Care (Signed)
  Problem: Education: Goal: Knowledge of General Education information will improve Description: Including pain rating scale, medication(s)/side effects and non-pharmacologic comfort measures Outcome: Progressing   Problem: Health Behavior/Discharge Planning: Goal: Ability to manage health-related needs will improve Outcome: Progressing   Problem: Clinical Measurements: Goal: Ability to maintain clinical measurements within normal limits will improve Outcome: Progressing Goal: Diagnostic test results will improve Outcome: Progressing Goal: Respiratory complications will improve Outcome: Progressing Goal: Cardiovascular complication will be avoided Outcome: Progressing   Problem: Nutrition: Goal: Adequate nutrition will be maintained Outcome: Progressing   Problem: Elimination: Goal: Will not experience complications related to bowel motility Outcome: Progressing Goal: Will not experience complications related to urinary retention Outcome: Progressing   Problem: Pain Managment: Goal: General experience of comfort will improve and/or be controlled Outcome: Progressing   Problem: Safety: Goal: Ability to remain free from injury will improve Outcome: Progressing

## 2024-02-24 NOTE — Assessment & Plan Note (Signed)
 Renal function with serum cr at 1,50 with K at 4,1 and serum bicarbonate at 35 Na 140 and Mg 2.1   Continue volume overloaded Will continue diuresis with furosemide , SGLT 2 ing and spironolactone Follow up renal function and electrolytes in am Avoid hypotension and nephrotoxic medications.

## 2024-02-24 NOTE — Progress Notes (Signed)
 PT Cancellation Note  Patient Details Name: Sherri Hart MRN: [id number] DOB: February 23, 1946   Cancelled Treatment:    Reason Eval/Treat Not Completed: Pain limiting ability to participate  Patient up in chair. Reports legs too painful to ambulate. Checked with RN and pt due for pain meds in ~30 minutes.    Gayle Kava, PT Acute Rehabilitation Services  Office [phone]  Gu[place] Leep 02/24/2024, 2:35 PM

## 2024-02-24 NOTE — Progress Notes (Addendum)
 PROGRESS NOTE    Sherri Hart  EXB:[id number] DOB: 12/23/1945 DOA: 02/21/2024 PCP: Ulysees Gander, MD  77/F w atrial fibrillation, COPD, diastolic CHF, and obesity who presented with dyspnea, wheezing and lower extremity edema for the last 3 days. EMS was called, > respiratory distress,  In ED, 02 saturation 86%. diffuse bilateral wheezing, and positive lower extremity edema. CXR w bilateral hilar vascular congestion, with bilateral central interstitial infiltrates. 6/16 responding well to diuresis.  Subjective: Feels fair, improving overall  Assessment and Plan:  Acute on chronic diastolic CHF ) -Echo with EF 60 to 65%, , RV mild reduction, RA with severe dilatation,  - Improving with diuresis, 9.5 L negative, continue IV Lasix  today  - Continue jardiance and spironolactone.  -Monitor I/os, daily weights - Increase activity, PT OT  Acute hypoxemic respiratory failure, on chronic hypoxemia,  -due to acute cardiogenic pulmonary edema.  -as above  Left ankle pain, swelling - Suspect gout flare, uric acid elevated - Prednisone  X 3 days  Permanent atrial fibrillation (HCC) Continue diltiazem  and rivaroxaban .   COPD with acute exacerbation (HCC) Clinically improved.  Will continue bronchodilator therapy, ICS and LABA.   Obesity, class 3 Calculated BMI is 43,7   Osteoarthritis, PT and OT.   Nicotine  dependence, cigarettes, uncomplicated Smoking cessation counseling.    DVT prophylaxis: xarelto   Code Status: Full Code Family Communication: None present Disposition Plan: Home likely 1 to 2 days    Objective: Vitals:   02/23/24 2321 02/24/24 0349 02/24/24 0730 02/24/24 1138  BP: 118/67 98/75 110/66 (!) 115/52  Pulse: 94  91 85  Resp: 20 20 19 17   Temp: 98.3 F (36.8 C) 98 F (36.7 C) 97.9 F (36.6 C) 97.6 F (36.4 C)  TempSrc: Oral Oral Oral Oral  SpO2: 91% 90% 93% 95%  Weight:  122.6 kg    Height:        Intake/Output Summary (Last 24 hours) at  02/24/2024 1454 Last data filed at 02/24/2024 1300 Gross per 24 hour  Intake 860 ml  Output 2250 ml  Net -1390 ml   Filed Weights   02/22/24 0500 02/23/24 0500 02/24/24 0349  Weight: 123 kg 120.9 kg 122.6 kg    Examination:  General exam: Appears calm and comfortable  HEENT: Positive JVD Respiratory system: Few basilar Rales Cardiovascular system: S1 & S2 heard, irregular rhythm Abd: nondistended, soft and nontender.Normal bowel sounds heard. Central nervous system: Alert and oriented. No focal neurological deficits. Extremities: 1-2+ edema Skin: No rashes Psychiatry:  Mood & affect appropriate.     Data Reviewed:   CBC: Recent Labs  Lab 02/21/24 1507  WBC 7.4  NEUTROABS 5.2  HGB 13.8  HCT 44.8  MCV 106.9*  PLT 181   Basic Metabolic Panel: Recent Labs  Lab 02/21/24 1507 02/22/24 0208 02/23/24 0235 02/24/24 0314  NA 140 140 139 140  K 4.2 3.8 4.1 4.1  CL 102 100 97* 95*  CO2 26 27 31  35*  GLUCOSE 110* 221* 164* 108*  BUN 20 21 30* 40*  CREATININE 1.06* 1.20* 1.28* 1.50*  CALCIUM 9.4 8.9 8.7* 8.7*  MG  --   --  2.0 2.1   GFR: Estimated Creatinine Clearance: 43.3 mL/min (A) (by C-G formula based on SCr of 1.5 mg/dL (H)). Liver Function Tests: Recent Labs  Lab 02/21/24 1507  AST 18  ALT 11  ALKPHOS 60  BILITOT 0.9  PROT 7.2  ALBUMIN 3.9   No results for input(s): LIPASE, AMYLASE in the  last 168 hours. No results for input(s): AMMONIA in the last 168 hours. Coagulation Profile: No results for input(s): INR, PROTIME in the last 168 hours. Cardiac Enzymes: No results for input(s): CKTOTAL, CKMB, CKMBINDEX, TROPONINI in the last 168 hours. BNP (last 3 results) No results for input(s): PROBNP in the last 8760 hours. HbA1C: No results for input(s): HGBA1C in the last 72 hours. CBG: No results for input(s): GLUCAP in the last 168 hours. Lipid Profile: No results for input(s): CHOL, HDL, LDLCALC, TRIG, CHOLHDL,  LDLDIRECT in the last 72 hours. Thyroid Function Tests: No results for input(s): TSH, T4TOTAL, FREET4, T3FREE, THYROIDAB in the last 72 hours. Anemia Panel: No results for input(s): VITAMINB12, FOLATE, FERRITIN, TIBC, IRON, RETICCTPCT in the last 72 hours. Urine analysis:    Component Value Date/Time   COLORURINE YELLOW 02/26/2023 1031   APPEARANCEUR CLEAR 02/26/2023 1031   LABSPEC 1.024 02/26/2023 1031   PHURINE 5.0 02/26/2023 1031   GLUCOSEU NEGATIVE 02/26/2023 1031   HGBUR NEGATIVE 02/26/2023 1031   BILIRUBINUR NEGATIVE 02/26/2023 1031   KETONESUR NEGATIVE 02/26/2023 1031   PROTEINUR 30 (A) 02/26/2023 1031   NITRITE POSITIVE (A) 02/26/2023 1031   LEUKOCYTESUR NEGATIVE 02/26/2023 1031   Sepsis Labs: @LABRCNTIP (procalcitonin:4,lacticidven:4)  ) Recent Results (from the past 240 hours)  MRSA Next Gen by PCR, Nasal     Status: None   Collection Time: 02/21/24  8:16 PM   Specimen: Nasal Mucosa; Nasal Swab  Result Value Ref Range Status   MRSA by PCR Next Gen NOT DETECTED NOT DETECTED Final    Comment: (NOTE) The GeneXpert MRSA Assay (FDA approved for NASAL specimens only), is one component of a comprehensive MRSA colonization surveillance program. It is not intended to diagnose MRSA infection nor to guide or monitor treatment for MRSA infections. Test performance is not FDA approved in patients less than 54 years old. Performed at [facility], [address], [place], [place] [address]      Radiology Studies: No results found.   Scheduled Meds:  diltiazem   180 mg Oral QHS   empagliflozin  10 mg Oral Daily   fluticasone  furoate-vilanterol  1 puff Inhalation Daily   furosemide   80 mg Intravenous Q12H   guaiFENesin   600 mg Oral BID   ipratropium-albuterol   3 mL Nebulization BID   nicotine   21 mg Transdermal Q2000   pravastatin   20 mg Oral QHS   rivaroxaban   20 mg Oral Q supper   sodium chloride  flush  3 mL Intravenous Q12H    spironolactone  12.5 mg Oral Daily   Continuous Infusions:   LOS: 3 days    Time spent:    Deforest Fast, MD [facility]   02/24/2024, 2:54 PM

## 2024-02-24 NOTE — Progress Notes (Signed)
 PT Cancellation Note  Patient Details Name: Aquita Simmering MRN: [id number] DOB: [birth year]   Cancelled Treatment:    Reason Eval/Treat Not Completed: Fatigue/lethargy limiting ability to participate  Patient never asked for pain meds. Just back to bed with nursing. Will attempt 6/18 after pre-medication.    Gayle Kava, PT Acute Rehabilitation Services  Office [phone]  [place] Leep 02/24/2024, 2:37 PM

## 2024-02-24 NOTE — Plan of Care (Signed)

## 2024-02-25 DIAGNOSIS — I5033 Acute on chronic diastolic (congestive) heart failure: Secondary | ICD-10-CM | POA: Diagnosis not present

## 2024-02-25 LAB — BASIC METABOLIC PANEL WITH GFR
Anion gap: 11 (ref 5–15)
BUN: 39 mg/dL — ABNORMAL HIGH (ref 8–23)
CO2: 35 mmol/L — ABNORMAL HIGH (ref 22–32)
Calcium: 8.9 mg/dL (ref 8.9–10.3)
Chloride: 92 mmol/L — ABNORMAL LOW (ref 98–111)
Creatinine, Ser: 1.24 mg/dL — ABNORMAL HIGH (ref 0.44–1.00)
GFR, Estimated: 45 mL/min — ABNORMAL LOW (ref >60)
Glucose, Bld: 106 mg/dL — ABNORMAL HIGH (ref 70–99)
Potassium: 3.9 mmol/L (ref 3.5–5.1)
Sodium: 138 mmol/L (ref 135–145)

## 2024-02-25 LAB — MAGNESIUM: Magnesium: 2.1 mg/dL (ref 1.7–2.4)

## 2024-02-25 LAB — URIC ACID: Uric Acid, Serum: 8.8 mg/dL — ABNORMAL HIGH (ref 2.5–7.1)

## 2024-02-25 MED ORDER — PREDNISONE 20 MG PO TABS
40.0000 mg | ORAL_TABLET | Freq: Every day | ORAL | Status: AC
  Administered 2024-02-25 – 2024-02-27 (×2): 40 mg via ORAL
  Filled 2024-02-25 (×3): qty 2

## 2024-02-25 MED ORDER — FUROSEMIDE 10 MG/ML IJ SOLN
60.0000 mg | Freq: Two times a day (BID) | INTRAMUSCULAR | Status: DC
  Administered 2024-02-25 – 2024-02-27 (×2): 60 mg via INTRAVENOUS
  Filled 2024-02-25 (×4): qty 6

## 2024-02-25 NOTE — Plan of Care (Signed)

## 2024-02-25 NOTE — Evaluation (Signed)
 Physical Therapy Evaluation Patient Details Name: Sherri Hart MRN: [id number] DOB: [birth year] Today's Date: 02/25/2024  History of Present Illness  Pt is a 78 y/o F presenting to ED on 6/14 with SOB and BLE edema on 2L O2 chronically. Admitted for acute on chronic diastolic CHF. PMH includes COPD, CHF, A fib on Xarelto   Clinical Impression  Pt admitted with above diagnosis and presents to PT with functional limitations due to deficits listed below (See PT problem list). Pt needs skilled PT to maximize independence and safety. Pt lives in boarding house with support of other residents and granddaughter. Should make good progress and be able to return with HHPT.          If plan is discharge home, recommend the following: Assistance with cooking/housework;Assist for transportation   Can travel by private vehicle        Equipment Recommendations None recommended by PT  Recommendations for Other Services       Functional Status Assessment Patient has had a recent decline in their functional status and demonstrates the ability to make significant improvements in function in a reasonable and predictable amount of time.     Precautions / Restrictions Precautions Precautions: Fall Restrictions Weight Bearing Restrictions Per Provider Order: No      Mobility  Bed Mobility Overal bed mobility: Needs Assistance Bed Mobility: Supine to Sit, Sit to Supine     Supine to sit: Supervision Sit to supine: Min assist   General bed mobility comments: Assist to bring legs back into bed    Transfers Overall transfer level: Needs assistance Equipment used: Rolling walker (2 wheels) Transfers: Sit to/from Stand Sit to Stand: Contact guard assist                Ambulation/Gait Ambulation/Gait assistance: Contact guard assist Gait Distance (Feet): 120 Feet Assistive device: Rollator (4 wheels) Gait Pattern/deviations: Step-through pattern, Decreased stride length, Trunk  flexed Gait velocity: decr Gait velocity interpretation: <1.31 ft/sec, indicative of household ambulator   General Gait Details: Verbal cues to stand more erect and closer to rollator. Pt with c/o UE fatigue.  Stairs            Wheelchair Mobility     Tilt Bed    Modified Rankin (Stroke Patients Only)       Balance Overall balance assessment: Needs assistance Sitting-balance support: Feet supported Sitting balance-Leahy Scale: Good     Standing balance support: During functional activity, No upper extremity supported Standing balance-Leahy Scale: Fair                               Pertinent Vitals/Pain Pain Assessment Pain Assessment: Faces Faces Pain Scale: Hurts little more Pain Location: ankles/feet Pain Descriptors / Indicators: Grimacing, Guarding Pain Intervention(s): Limited activity within patient's tolerance, Monitored during session, Repositioned    Home Living Family/patient expects to be discharged to:: Private residence (boarding house) Living Arrangements: Other (Comment) (boarding house)     Home Access: Ramped entrance       Home Layout: One level Home Equipment: Rollator (4 wheels);Cane - single point;Shower seat Additional Comments: granddaughter lives 5 min away, wears 2L O2 PRN, pt reports mostly at night    Prior Function Prior Level of Function : Needs assist             Mobility Comments: rollator at all times ADLs Comments: granddaughter drives, does own meals, manages own meds, ind with ADLs, showers  seated     Extremity/Trunk Assessment   Upper Extremity Assessment Upper Extremity Assessment: Defer to OT evaluation    Lower Extremity Assessment Lower Extremity Assessment: Generalized weakness    Cervical / Trunk Assessment Cervical / Trunk Assessment: Normal  Communication   Communication Communication: No apparent difficulties    Cognition Arousal: Alert Behavior During Therapy: WFL for tasks  assessed/performed   PT - Cognitive impairments: No apparent impairments                         Following commands: Intact       Cueing Cueing Techniques: Verbal cues     General Comments General comments (skin integrity, edema, etc.): Pt on 7L O2 at rest. Placed on 8L for amb. SpO2 to 84% with amb on 8L. Returned to 90% after 3-4 minutes rest.    Exercises     Assessment/Plan    PT Assessment Patient needs continued PT services  PT Problem List Decreased strength;Decreased activity tolerance;Decreased balance;Decreased mobility;Pain;Cardiopulmonary status limiting activity       PT Treatment Interventions DME instruction;Gait training;Functional mobility training;Therapeutic activities;Therapeutic exercise;Balance training;Patient/family education    PT Goals (Current goals can be found in the Care Plan section)  Acute Rehab PT Goals Patient Stated Goal: return home PT Goal Formulation: With patient Time For Goal Achievement: 03/10/24 Potential to Achieve Goals: Good    Frequency Min 2X/week     Co-evaluation               AM-PAC PT 6 Clicks Mobility  Outcome Measure Help needed turning from your back to your side while in a flat bed without using bedrails?: None Help needed moving from lying on your back to sitting on the side of a flat bed without using bedrails?: A Little Help needed moving to and from a bed to a chair (including a wheelchair)?: A Little Help needed standing up from a chair using your arms (e.g., wheelchair or bedside chair)?: A Little Help needed to walk in hospital room?: A Little Help needed climbing 3-5 steps with a railing? : A Little 6 Click Score: 19    End of Session Equipment Utilized During Treatment: Gait belt;Oxygen Activity Tolerance: Patient tolerated treatment well Patient left: in bed;with call bell/phone within reach;with bed alarm set;with family/visitor present Nurse Communication: Mobility status PT  Visit Diagnosis: Other abnormalities of gait and mobility (R26.89);Muscle weakness (generalized) (M62.81);Pain Pain - Right/Left:  (bil) Pain - part of body: Ankle and joints of foot    Time: 0921-0940 PT Time Calculation (min) (ACUTE ONLY): 19 min   Charges:   PT Evaluation $PT Eval Moderate Complexity: 1 Mod   PT General Charges $$ ACUTE PT VISIT: 1 Visit         [facility] PT Acute Rehabilitation Services Office [phone]   Pura Browns [facility] 02/25/2024, 10:43 AM

## 2024-02-25 NOTE — Progress Notes (Signed)
 Mobility Specialist Progress Note;   02/25/24 1003  Mobility  Activity Transferred from bed to chair  Level of Assistance Contact guard assist, steadying assist  Assistive Device Other (Comment) (HHA)  Distance Ambulated (ft) 3 ft  Activity Response Tolerated well  Mobility Referral Yes  Mobility visit 1 Mobility  Mobility Specialist Start Time (ACUTE ONLY) 1003  Mobility Specialist Stop Time (ACUTE ONLY) 1010  Mobility Specialist Time Calculation (min) (ACUTE ONLY) 7 min   Pt agreeable to mobility. Required MinG assistance via HHA to safely transfer pt from bed to chair. VSS on 7LO2. Pt left comfortably in chair with all needs met, BLE elevated. Call bell in reach.   Janit Meline Mobility Specialist Please contact via SecureChat or [employer] [phone]

## 2024-02-25 NOTE — Progress Notes (Addendum)
 Occupational Therapy Treatment Patient Details Name: Sherri Hart MRN: [id number] DOB: 1946/02/24 Today's Date: 02/25/2024   History of present illness Pt is a 78 y/o F presenting to ED on 6/14 with SOB and BLE edema on 2L O2 chronically. Admitted for acute on chronic diastolic CHF. PMH includes COPD, CHF, A fib on Xarelto    OT comments  Pt making progress toward goals, limited by BLE pain this session, needed CGA for bed mobility and CGA for transfers with rollator. Pt needing min A for pericare in standing and able to stand x3 min for bathing/linen change, declines further mobility due to BLE pain. SpO2 88-low 90s on 5L-6L O2 during session.Pt presenting with impairments listed below, will follow acutely. Continue to recommend HHOT at d/c.       If plan is discharge home, recommend the following:  A lot of help with walking and/or transfers;A little help with bathing/dressing/bathroom;Assistance with cooking/housework;Direct supervision/assist for financial management;Direct supervision/assist for medications management;Help with stairs or ramp for entrance;Assist for transportation   Equipment Recommendations  None recommended by OT    Recommendations for Other Services PT consult    Precautions / Restrictions Precautions Precautions: Fall Restrictions Weight Bearing Restrictions Per Provider Order: No       Mobility Bed Mobility Overal bed mobility: Needs Assistance Bed Mobility: Supine to Sit, Sit to Supine     Supine to sit: Contact guard Sit to supine: Contact guard assist        Transfers Overall transfer level: Needs assistance Equipment used: Rolling walker (2 wheels) Transfers: Sit to/from Stand Sit to Stand: Contact guard assist                 Balance Overall balance assessment: Needs assistance Sitting-balance support: Feet supported Sitting balance-Leahy Scale: Good     Standing balance support: During functional activity, No upper  extremity supported Standing balance-Leahy Scale: Fair                             ADL either performed or assessed with clinical judgement   ADL Overall ADL's : Needs assistance/impaired         Upper Body Bathing: Moderate assistance Upper Body Bathing Details (indicate cue type and reason): to wash back             Toilet Transfer: Contact guard assist   Toileting- Clothing Manipulation and Hygiene: Minimal assistance;Sit to/from stand       Functional mobility during ADLs: Contact guard assist;Rolling walker (2 wheels)      Extremity/Trunk Assessment Upper Extremity Assessment Upper Extremity Assessment: Generalized weakness   Lower Extremity Assessment Lower Extremity Assessment: Defer to PT evaluation        Vision   Vision Assessment?: No apparent visual deficits   Perception Perception Perception: Not tested   Praxis Praxis Praxis: Not tested   Communication Communication Communication: No apparent difficulties   Cognition Arousal: Alert Behavior During Therapy: WFL for tasks assessed/performed Cognition: No apparent impairments                               Following commands: Intact        Cueing   Cueing Techniques: Verbal cues  Exercises      Shoulder Instructions       General Comments satting high 80s-90s on 5L O2 at rest    Pertinent Vitals/ Pain  Pain Assessment Pain Assessment: Faces Pain Score: 4  Faces Pain Scale: Hurts little more Pain Location: ankles/feet Pain Descriptors / Indicators: Grimacing, Guarding Pain Intervention(s): Limited activity within patient's tolerance, Monitored during session, Repositioned  Home Living                                          Prior Functioning/Environment              Frequency  Min 1X/week        Progress Toward Goals  OT Goals(current goals can now be found in the care plan section)  Progress towards OT goals:  Progressing toward goals  Acute Rehab OT Goals Patient Stated Goal: none stated OT Goal Formulation: With patient Time For Goal Achievement: 03/08/24 Potential to Achieve Goals: Good ADL Goals Pt Will Perform Upper Body Dressing: with modified independence;sitting Pt Will Perform Lower Body Dressing: with modified independence;sitting/lateral leans;sit to/from stand Pt Will Transfer to Toilet: with modified independence;ambulating;regular height toilet Pt Will Perform Tub/Shower Transfer: Tub transfer;Shower transfer;with modified independence;ambulating;shower seat Additional ADL Goal #1: pt will verbalize x3 energy conservation strategies in prep for ADLs  Plan      Co-evaluation                 AM-PAC OT 6 Clicks Daily Activity     Outcome Measure   Help from another person eating meals?: A Little Help from another person taking care of personal grooming?: A Little Help from another person toileting, which includes using toliet, bedpan, or urinal?: A Little Help from another person bathing (including washing, rinsing, drying)?: A Little Help from another person to put on and taking off regular upper body clothing?: A Little Help from another person to put on and taking off regular lower body clothing?: A Little 6 Click Score: 18    End of Session Equipment Utilized During Treatment: Rolling walker (2 wheels);Oxygen  OT Visit Diagnosis: Other abnormalities of gait and mobility (R26.89);Unsteadiness on feet (R26.81);Muscle weakness (generalized) (M62.81)   Activity Tolerance Patient tolerated treatment well   Patient Left in bed;with call bell/phone within reach;with bed alarm set   Nurse Communication Mobility status        Time: 1430-1450 OT Time Calculation (min): 20 min  Charges: OT General Charges $OT Visit: 1 Visit OT Treatments $Self Care/Home Management : 8-22 mins  Kaliyan Osbourn K, OTD, OTR/L SecureChat Preferred Acute Rehab [phone]   Benedict Brain Koonce 02/25/2024, 3:29 PM

## 2024-02-26 DIAGNOSIS — I5033 Acute on chronic diastolic (congestive) heart failure: Secondary | ICD-10-CM | POA: Diagnosis not present

## 2024-02-26 LAB — BASIC METABOLIC PANEL WITH GFR
Anion gap: 12 (ref 5–15)
BUN: 38 mg/dL — ABNORMAL HIGH (ref 8–23)
CO2: 35 mmol/L — ABNORMAL HIGH (ref 22–32)
Calcium: 9.1 mg/dL (ref 8.9–10.3)
Chloride: 89 mmol/L — ABNORMAL LOW (ref 98–111)
Creatinine, Ser: 1.21 mg/dL — ABNORMAL HIGH (ref 0.44–1.00)
GFR, Estimated: 47 mL/min — ABNORMAL LOW (ref >60)
Glucose, Bld: 158 mg/dL — ABNORMAL HIGH (ref 70–99)
Potassium: 4 mmol/L (ref 3.5–5.1)
Sodium: 136 mmol/L (ref 135–145)

## 2024-02-26 MED ORDER — ALUM & MAG HYDROXIDE-SIMETH 200-200-20 MG/5ML PO SUSP
30.0000 mL | ORAL | Status: DC | PRN
  Filled 2024-02-26 (×2): qty 30

## 2024-02-26 NOTE — Plan of Care (Signed)
   Problem: Education: Goal: Knowledge of General Education information will improve Description Including pain rating scale, medication(s)/side effects and non-pharmacologic comfort measures Outcome: Progressing

## 2024-02-26 NOTE — Progress Notes (Signed)
 PROGRESS NOTE    Sherri Hart  UJW:[id number] DOB: [birth year] DOA: 02/21/2024 PCP: Ulysees Gander, MD  78/F w atrial fibrillation, COPD, diastolic CHF, and obesity who presented with dyspnea, wheezing and lower extremity edema for the last 3 days. EMS was called, > respiratory distress,  In ED, 02 saturation 86%. diffuse bilateral wheezing, and positive lower extremity edema. CXR w bilateral hilar vascular congestion, with bilateral central interstitial infiltrates. 6/16 responding well to diuresis.  Subjective: Feels fair, improving overall  Assessment and Plan:  Acute on chronic diastolic CHF ) -Echo with EF 60 to 65%, , RV mild reduction, RA with severe dilatation,  - Improving with diuresis, 11.3 L negative, weight down 13 LB  - Continue IV Lasix  1 more day, Jardiance and Aldactone - Increase activity, PT OT - Attempt to wean O2, incentive spirometry  Acute hypoxemic respiratory failure, on chronic hypoxemia,  -due to acute cardiogenic pulmonary edema.  -as above  Left ankle pain, swelling - Suspect gout flare, uric acid elevated - Prednisone  X2 more days  Permanent atrial fibrillation (HCC) Continue diltiazem  and rivaroxaban .   COPD with acute exacerbation (HCC) Clinically improved.  Will continue bronchodilator therapy, ICS and LABA.   Obesity, class 3 Calculated BMI is 43,7   Osteoarthritis, PT and OT.   Nicotine  dependence, cigarettes, uncomplicated Smoking cessation counseling.    DVT prophylaxis: xarelto   Code Status: Full Code Family Communication: None present Disposition Plan: Home likely 1 to 2 days    Objective: Vitals:   02/25/24 2010 02/25/24 2317 02/26/24 0309 02/26/24 0746  BP: 134/70 104/75 (!) 141/93 105/61  Pulse: 86 82 82 73  Resp: 18 16 18 15   Temp: 98.4 F (36.9 C) 97.8 F (36.6 C) (!) 97.5 F (36.4 C) (!) 97 F (36.1 C)  TempSrc: Oral Oral Oral Axillary  SpO2: 90% 90% 90% (!) 87%  Weight:   117.4 kg   Height:         Intake/Output Summary (Last 24 hours) at 02/26/2024 0932 Last data filed at 02/26/2024 0931 Gross per 24 hour  Intake 840 ml  Output 2550 ml  Net -1710 ml   Filed Weights   02/24/24 0349 02/25/24 0356 02/26/24 0309  Weight: 122.6 kg 119.6 kg 117.4 kg    Examination:  General exam: Appears calm and comfortable AAO x 3 HEENT: Positive JVD CVS: S1-S2, irregular rhythm Lungs: Few basilar Rales otherwise clear Abdomen: Soft, nontender, bowel sounds present Extremities: 1+ edema Skin: No rashes Psychiatry:  Mood & affect appropriate.     Data Reviewed:   CBC: Recent Labs  Lab 02/21/24 1507  WBC 7.4  NEUTROABS 5.2  HGB 13.8  HCT 44.8  MCV 106.9*  PLT 181   Basic Metabolic Panel: Recent Labs  Lab 02/22/24 0208 02/23/24 0235 02/24/24 0314 02/25/24 0244 02/26/24 0350  NA 140 139 140 138 136  K 3.8 4.1 4.1 3.9 4.0  CL 100 97* 95* 92* 89*  CO2 27 31 35* 35* 35*  GLUCOSE 221* 164* 108* 106* 158*  BUN 21 30* 40* 39* 38*  CREATININE 1.20* 1.28* 1.50* 1.24* 1.21*  CALCIUM 8.9 8.7* 8.7* 8.9 9.1  MG  --  2.0 2.1 2.1  --    GFR: Estimated Creatinine Clearance: 52.3 mL/min (A) (by C-G formula based on SCr of 1.21 mg/dL (H)). Liver Function Tests: Recent Labs  Lab 02/21/24 1507  AST 18  ALT 11  ALKPHOS 60  BILITOT 0.9  PROT 7.2  ALBUMIN 3.9  No results for input(s): LIPASE, AMYLASE in the last 168 hours. No results for input(s): AMMONIA in the last 168 hours. Coagulation Profile: No results for input(s): INR, PROTIME in the last 168 hours. Cardiac Enzymes: No results for input(s): CKTOTAL, CKMB, CKMBINDEX, TROPONINI in the last 168 hours. BNP (last 3 results) No results for input(s): PROBNP in the last 8760 hours. HbA1C: No results for input(s): HGBA1C in the last 72 hours. CBG: No results for input(s): GLUCAP in the last 168 hours. Lipid Profile: No results for input(s): CHOL, HDL, LDLCALC, TRIG, CHOLHDL, LDLDIRECT  in the last 72 hours. Thyroid Function Tests: No results for input(s): TSH, T4TOTAL, FREET4, T3FREE, THYROIDAB in the last 72 hours. Anemia Panel: No results for input(s): VITAMINB12, FOLATE, FERRITIN, TIBC, IRON, RETICCTPCT in the last 72 hours. Urine analysis:    Component Value Date/Time   COLORURINE YELLOW 02/26/2023 1031   APPEARANCEUR CLEAR 02/26/2023 1031   LABSPEC 1.024 02/26/2023 1031   PHURINE 5.0 02/26/2023 1031   GLUCOSEU NEGATIVE 02/26/2023 1031   HGBUR NEGATIVE 02/26/2023 1031   BILIRUBINUR NEGATIVE 02/26/2023 1031   KETONESUR NEGATIVE 02/26/2023 1031   PROTEINUR 30 (A) 02/26/2023 1031   NITRITE POSITIVE (A) 02/26/2023 1031   LEUKOCYTESUR NEGATIVE 02/26/2023 1031   Sepsis Labs: @LABRCNTIP (procalcitonin:4,lacticidven:4)  ) Recent Results (from the past 240 hours)  MRSA Next Gen by PCR, Nasal     Status: None   Collection Time: 02/21/24  8:16 PM   Specimen: Nasal Mucosa; Nasal Swab  Result Value Ref Range Status   MRSA by PCR Next Gen NOT DETECTED NOT DETECTED Final    Comment: (NOTE) The GeneXpert MRSA Assay (FDA approved for NASAL specimens only), is one component of a comprehensive MRSA colonization surveillance program. It is not intended to diagnose MRSA infection nor to guide or monitor treatment for MRSA infections. Test performance is not FDA approved in patients less than 8 years old. Performed at [facility], [address] [place], [place] [address]      Radiology Studies: No results found.   Scheduled Meds:  diltiazem   180 mg Oral QHS   empagliflozin  10 mg Oral Daily   fluticasone  furoate-vilanterol  1 puff Inhalation Daily   furosemide   60 mg Intravenous Q12H   guaiFENesin   600 mg Oral BID   nicotine   21 mg Transdermal Q2000   pravastatin   20 mg Oral QHS   predniSONE   40 mg Oral Q breakfast   rivaroxaban   20 mg Oral Q supper   sodium chloride  flush  3 mL Intravenous Q12H   spironolactone  12.5 mg Oral  Daily   Continuous Infusions:   LOS: 5 days    Time spent:    Deforest Fast, MD [facility]   02/26/2024, 9:32 AM

## 2024-02-26 NOTE — TOC Progression Note (Signed)
 Transition of Care [facility]) - Progression Note    Patient Details  Name: Wayne Brunker MRN: [id number] Date of Birth: 09/27/1945  [facility] CM/SW Contact  Jeani Mill, RN Phone Number: 02/26/2024, 9:20 AM  Clinical Narrative:     Spoke to patient regarding home health.  Patient declines home health stating she moves around on her own and has roommate assistance.  TOC will continue to follow for needs.  Expected Discharge Plan: Home w Home Health Services Barriers to Discharge: Continued Medical Work up  Expected Discharge Plan and Services       Living arrangements for the past 2 months: Apartment                 DME Arranged: Oxygen DME Agency: [employer] Date DME Agency Contacted: 02/23/24 Time DME Agency Contacted: 1329 Representative spoke with at DME Agency: Zack             Social Determinants of Health (SDOH) Interventions SDOH Screenings   Food Insecurity: No Food Insecurity (02/21/2024)  Housing: Low Risk  (02/21/2024)  Transportation Needs: No Transportation Needs (02/21/2024)  Utilities: Not At Risk (02/21/2024)  Social Connections: Moderately Isolated (02/21/2024)  Tobacco Use: High Risk (02/21/2024)    Readmission Risk Interventions     No data to display

## 2024-02-26 NOTE — Progress Notes (Addendum)
 Mobility Specialist Progress Note;   02/26/24 0915  Mobility  Activity Transferred from bed to chair  Level of Assistance Contact guard assist, steadying assist  Assistive Device Front wheel walker  Distance Ambulated (ft) 5 ft  Activity Response Tolerated well  Mobility Referral Yes  Mobility visit 1 Mobility  Mobility Specialist Start Time (ACUTE ONLY) 0915  Mobility Specialist Stop Time (ACUTE ONLY) 0924  Mobility Specialist Time Calculation (min) (ACUTE ONLY) 9 min   Pt agreeable to mobility. On 6LO2 upon arrival. Required MinG assistance to safely transfer pt from bed to chair. Pt declined hallway ambulation d/t BLE pain. VSS on 6LO2. Pt left in chair with all needs met, call bell in reach.  Janit Meline Mobility Specialist Please contact via SecureChat or [employer] [phone]

## 2024-02-27 DIAGNOSIS — I5033 Acute on chronic diastolic (congestive) heart failure: Secondary | ICD-10-CM | POA: Diagnosis not present

## 2024-02-27 LAB — BASIC METABOLIC PANEL WITH GFR
Anion gap: 10 (ref 5–15)
BUN: 50 mg/dL — ABNORMAL HIGH (ref 8–23)
CO2: 38 mmol/L — ABNORMAL HIGH (ref 22–32)
Calcium: 9.1 mg/dL (ref 8.9–10.3)
Chloride: 87 mmol/L — ABNORMAL LOW (ref 98–111)
Creatinine, Ser: 1.6 mg/dL — ABNORMAL HIGH (ref 0.44–1.00)
GFR, Estimated: 33 mL/min — ABNORMAL LOW (ref >60)
Glucose, Bld: 137 mg/dL — ABNORMAL HIGH (ref 70–99)
Potassium: 4.1 mmol/L (ref 3.5–5.1)
Sodium: 135 mmol/L (ref 135–145)

## 2024-02-27 MED ORDER — TRAZODONE HCL 50 MG PO TABS
25.0000 mg | ORAL_TABLET | Freq: Every evening | ORAL | Status: DC | PRN
  Administered 2024-02-27 – 2024-03-01 (×3): 25 mg via ORAL
  Filled 2024-02-27 (×3): qty 1

## 2024-02-27 MED ORDER — FLUTICASONE PROPIONATE 50 MCG/ACT NA SUSP
1.0000 | Freq: Two times a day (BID) | NASAL | Status: DC | PRN
  Administered 2024-02-27 – 2024-03-01 (×3): 1 via NASAL
  Filled 2024-02-27: qty 16

## 2024-02-27 MED ORDER — MORPHINE SULFATE (PF) 2 MG/ML IV SOLN
2.0000 mg | INTRAVENOUS | Status: DC | PRN
  Administered 2024-02-27 – 2024-02-29 (×7): 2 mg via INTRAVENOUS
  Filled 2024-02-27 (×6): qty 1

## 2024-02-27 MED ORDER — OXYCODONE-ACETAMINOPHEN 5-325 MG PO TABS
1.0000 | ORAL_TABLET | Freq: Four times a day (QID) | ORAL | Status: AC | PRN
  Administered 2024-02-27 (×4): 1 via ORAL
  Filled 2024-02-27 (×4): qty 1

## 2024-02-27 MED ORDER — OXYCODONE-ACETAMINOPHEN 5-325 MG PO TABS: 1.0000 | ORAL_TABLET | Freq: Four times a day (QID) | ORAL | Status: DC | PRN

## 2024-02-27 NOTE — Progress Notes (Signed)
 PROGRESS NOTE    Sherri Hart  ZOX:[id number] DOB: 06-17-1946 DOA: 02/21/2024 PCP: Ulysees Gander, MD  77/F w atrial fibrillation, COPD, diastolic CHF, and obesity who presented with dyspnea, wheezing and lower extremity edema for the last 3 days. EMS was called, > respiratory distress,  In ED, 02 saturation 86%. diffuse bilateral wheezing, and positive lower extremity edema. CXR w bilateral hilar vascular congestion, with bilateral central interstitial infiltrates. 6/16 responding well to diuresis.  Subjective: As well, breathing improved and back to baseline, still struggling with O2 needs, fluctuating from 6 to 7 L  Assessment and Plan:  Acute on chronic diastolic CHF ) -Echo with EF 60 to 65%, , RV mild reduction, RA with severe dilatation,  - M with diuresis, creatinine starting to trend up a little bit, 11 L negative, hold further IV Lasix  today  - Continue Jardiance and Aldactone, resume oral diuretics tomorrow  -Attempt to wean O2, incentive spirometry  Acute hypoxemic respiratory failure, on chronic hypoxemia,  - Due to underlying advanced COPD and atelectasis, CHF - Pulm status has improved, however still remains moderately hypoxic, incentive spirometer, flutter valve, add DuoNebs  Left ankle pain, swelling - Suspect gout flare, uric acid elevated - Prednisone  X2 more days  Permanent atrial fibrillation (HCC) Continue diltiazem  and rivaroxaban .   COPD with acute exacerbation (HCC) Clinically improved.  Will continue bronchodilator therapy, ICS and LABA.   Obesity, class 3 Calculated BMI is 43,7   Osteoarthritis, PT and OT.   Nicotine  dependence, cigarettes, uncomplicated Smoking cessation counseling.    DVT prophylaxis: xarelto   Code Status: Full Code Family Communication: None present Disposition Plan: Home likely tomorrow    Objective: Vitals:   02/27/24 0326 02/27/24 0500 02/27/24 0746 02/27/24 0830  BP: 125/76  123/70   Pulse: 85  73    Resp: 15  17   Temp: 97.9 F (36.6 C)  97.6 F (36.4 C)   TempSrc: Oral  Oral   SpO2: 90%   91%  Weight:  115.3 kg    Height:        Intake/Output Summary (Last 24 hours) at 02/27/2024 8119 Last data filed at 02/27/2024 0457 Gross per 24 hour  Intake 1300 ml  Output 400 ml  Net 900 ml   Filed Weights   02/25/24 0356 02/26/24 0309 02/27/24 0500  Weight: 119.6 kg 117.4 kg 115.3 kg    Examination:  General exam: Pleasant elderly female sitting up in bed, AO x 3 HEENT: No JVD CVS: S1-S2, irregular rhythm Lungs: Improving air movement, clear today Abdomen: Soft, nontender, bowel sounds present Extremities: Trace edema Skin: No rashes Psychiatry:  Mood & affect appropriate.     Data Reviewed:   CBC: Recent Labs  Lab 02/21/24 1507  WBC 7.4  NEUTROABS 5.2  HGB 13.8  HCT 44.8  MCV 106.9*  PLT 181   Basic Metabolic Panel: Recent Labs  Lab 02/23/24 0235 02/24/24 0314 02/25/24 0244 02/26/24 0350 02/27/24 0229  NA 139 140 138 136 135  K 4.1 4.1 3.9 4.0 4.1  CL 97* 95* 92* 89* 87*  CO2 31 35* 35* 35* 38*  GLUCOSE 164* 108* 106* 158* 137*  BUN 30* 40* 39* 38* 50*  CREATININE 1.28* 1.50* 1.24* 1.21* 1.60*  CALCIUM 8.7* 8.7* 8.9 9.1 9.1  MG 2.0 2.1 2.1  --   --    GFR: Estimated Creatinine Clearance: 39.2 mL/min (A) (by C-G formula based on SCr of 1.6 mg/dL (H)). Liver Function Tests: Recent Labs  Lab 02/21/24 1507  AST 18  ALT 11  ALKPHOS 60  BILITOT 0.9  PROT 7.2  ALBUMIN 3.9   No results for input(s): LIPASE, AMYLASE in the last 168 hours. No results for input(s): AMMONIA in the last 168 hours. Coagulation Profile: No results for input(s): INR, PROTIME in the last 168 hours. Cardiac Enzymes: No results for input(s): CKTOTAL, CKMB, CKMBINDEX, TROPONINI in the last 168 hours. BNP (last 3 results) No results for input(s): PROBNP in the last 8760 hours. HbA1C: No results for input(s): HGBA1C in the last 72 hours. CBG: No  results for input(s): GLUCAP in the last 168 hours. Lipid Profile: No results for input(s): CHOL, HDL, LDLCALC, TRIG, CHOLHDL, LDLDIRECT in the last 72 hours. Thyroid Function Tests: No results for input(s): TSH, T4TOTAL, FREET4, T3FREE, THYROIDAB in the last 72 hours. Anemia Panel: No results for input(s): VITAMINB12, FOLATE, FERRITIN, TIBC, IRON, RETICCTPCT in the last 72 hours. Urine analysis:    Component Value Date/Time   COLORURINE YELLOW 02/26/2023 1031   APPEARANCEUR CLEAR 02/26/2023 1031   LABSPEC 1.024 02/26/2023 1031   PHURINE 5.0 02/26/2023 1031   GLUCOSEU NEGATIVE 02/26/2023 1031   HGBUR NEGATIVE 02/26/2023 1031   BILIRUBINUR NEGATIVE 02/26/2023 1031   KETONESUR NEGATIVE 02/26/2023 1031   PROTEINUR 30 (A) 02/26/2023 1031   NITRITE POSITIVE (A) 02/26/2023 1031   LEUKOCYTESUR NEGATIVE 02/26/2023 1031   Sepsis Labs: @LABRCNTIP (procalcitonin:4,lacticidven:4)  ) Recent Results (from the past 240 hours)  MRSA Next Gen by PCR, Nasal     Status: None   Collection Time: 02/21/24  8:16 PM   Specimen: Nasal Mucosa; Nasal Swab  Result Value Ref Range Status   MRSA by PCR Next Gen NOT DETECTED NOT DETECTED Final    Comment: (NOTE) The GeneXpert MRSA Assay (FDA approved for NASAL specimens only), is one component of a comprehensive MRSA colonization surveillance program. It is not intended to diagnose MRSA infection nor to guide or monitor treatment for MRSA infections. Test performance is not FDA approved in patients less than 17 years old. Performed at [facility], [address], [place], [place] [address]      Radiology Studies: No results found.   Scheduled Meds:  diltiazem   180 mg Oral QHS   empagliflozin  10 mg Oral Daily   fluticasone  furoate-vilanterol  1 puff Inhalation Daily   guaiFENesin   600 mg Oral BID   nicotine   21 mg Transdermal Q2000   pravastatin   20 mg Oral QHS   rivaroxaban   20 mg Oral Q supper    sodium chloride  flush  3 mL Intravenous Q12H   spironolactone  12.5 mg Oral Daily   Continuous Infusions:   LOS: 6 days    Time spent:    Deforest Fast, MD [facility]   02/27/2024, 9:52 AM

## 2024-02-27 NOTE — Progress Notes (Signed)
 Occupational Therapy Treatment Patient Details Name: Sherri Hart MRN: [id number] DOB: 02-02-1946 Today's Date: 02/27/2024   History of present illness Pt is a 78 y/o F presenting to ED on 6/14 with SOB and BLE edema on 2L O2 chronically. Admitted for acute on chronic diastolic CHF. PMH includes COPD, CHF, A fib on Xarelto    OT comments  Patient received in supine with complaints of BLE  pain but willing to participate with OT. Patient was CGA to get to EOB and for transfer to recliner. Patient declined further mobility due to pain. Handout provided for energy conservations strategies and reviewed with patient. Patient on 5 liters O2 with SpO2 88-93%. Acute OT to continue to follow to address established goals.       If plan is discharge home, recommend the following:  A lot of help with walking and/or transfers;A little help with bathing/dressing/bathroom;Assistance with cooking/housework;Direct supervision/assist for financial management;Direct supervision/assist for medications management;Help with stairs or ramp for entrance;Assist for transportation   Equipment Recommendations  None recommended by OT    Recommendations for Other Services      Precautions / Restrictions Precautions Precautions: Fall Restrictions Weight Bearing Restrictions Per Provider Order: No       Mobility Bed Mobility Overal bed mobility: Needs Assistance Bed Mobility: Supine to Sit     Supine to sit: Contact guard     General bed mobility comments: increased time and CGA while scoot to EOB    Transfers Overall transfer level: Needs assistance Equipment used: Rolling walker (2 wheels) Transfers: Sit to/from Stand, Bed to chair/wheelchair/BSC Sit to Stand: Contact guard assist     Step pivot transfers: Contact guard assist     General transfer comment: transfer from EOB to recliner, patient declined further transfers due to complaints of LE pain     Balance Overall balance assessment:  Needs assistance Sitting-balance support: Feet supported Sitting balance-Leahy Scale: Good     Standing balance support: Bilateral upper extremity supported, During functional activity Standing balance-Leahy Scale: Fair Standing balance comment: reliant on RW for transfer                           ADL either performed or assessed with clinical judgement   ADL Overall ADL's : Needs assistance/impaired     Grooming: Set up;Sitting           Upper Body Dressing : Minimal assistance;Sitting   Lower Body Dressing: Moderate assistance;Sitting/lateral leans Lower Body Dressing Details (indicate cue type and reason): to donn socks Toilet Transfer: Contact guard assist Toilet Transfer Details (indicate cue type and reason): simulated                Extremity/Trunk Assessment              Vision       Perception     Praxis     Communication Communication Communication: No apparent difficulties   Cognition Arousal: Alert Behavior During Therapy: WFL for tasks assessed/performed Cognition: No apparent impairments                               Following commands: Intact        Cueing   Cueing Techniques: Verbal cues  Exercises      Shoulder Instructions       General Comments 5 liters O2 SpO2 88-93% BP 130/70 (87)    Pertinent Vitals/ Pain  Pain Assessment Pain Assessment: Faces Faces Pain Scale: Hurts little more Pain Location: ankles/feet Pain Descriptors / Indicators: Grimacing, Guarding Pain Intervention(s): Limited activity within patient's tolerance, Monitored during session, Repositioned, Patient requesting pain meds-RN notified  Home Living                                          Prior Functioning/Environment              Frequency  Min 1X/week        Progress Toward Goals  OT Goals(current goals can now be found in the care plan section)  Progress towards OT goals:  Progressing toward goals  Acute Rehab OT Goals Patient Stated Goal: have less pain OT Goal Formulation: With patient Time For Goal Achievement: 03/08/24 Potential to Achieve Goals: Good ADL Goals Pt Will Perform Upper Body Dressing: with modified independence;sitting Pt Will Perform Lower Body Dressing: with modified independence;sitting/lateral leans;sit to/from stand Pt Will Transfer to Toilet: with modified independence;ambulating;regular height toilet Pt Will Perform Tub/Shower Transfer: Tub transfer;Shower transfer;with modified independence;ambulating;shower seat Additional ADL Goal #1: pt will verbalize x3 energy conservation strategies in prep for ADLs  Plan      Co-evaluation                 AM-PAC OT 6 Clicks Daily Activity     Outcome Measure   Help from another person eating meals?: A Little Help from another person taking care of personal grooming?: A Little Help from another person toileting, which includes using toliet, bedpan, or urinal?: A Little Help from another person bathing (including washing, rinsing, drying)?: A Little Help from another person to put on and taking off regular upper body clothing?: A Little Help from another person to put on and taking off regular lower body clothing?: A Little 6 Click Score: 18    End of Session Equipment Utilized During Treatment: Rolling walker (2 wheels);Oxygen (5 liters)  OT Visit Diagnosis: Other abnormalities of gait and mobility (R26.89);Unsteadiness on feet (R26.81);Muscle weakness (generalized) (M62.81)   Activity Tolerance Patient tolerated treatment well   Patient Left in chair;with call bell/phone within reach;with chair alarm set   Nurse Communication Mobility status;Patient requests pain meds        Time: 6967-8938 OT Time Calculation (min): 25 min  Charges: OT General Charges $OT Visit: 1 Visit OT Treatments $Self Care/Home Management : 8-22 mins $Therapeutic Activity: 8-22 mins  Anitra Barn, OTA Acute Rehabilitation Services  Office [phone]   Jovita Nipper 02/27/2024, 11:48 AM

## 2024-02-27 NOTE — Progress Notes (Signed)
 SATURATION QUALIFICATIONS: (This note is used to comply with regulatory documentation for home oxygen)  Patient Saturations on Room Air at Rest = 83%  Patient Saturations on Room Air while Ambulating = NT%  Patient Saturations on 6 Liters of oxygen while Ambulating = 87%  Please briefly explain why patient needs home oxygen: Patient with hypoxia on RA at rest, not tested with ambulation due to hypoxic on RA at rest. Attempted on 4L with ambulation and SpO2 85%  Sherri Hart, PT Acute Rehabilitation Services Office:[phone] 02/27/2024

## 2024-02-27 NOTE — Progress Notes (Signed)
 Physical Therapy Treatment Patient Details Name: Sherri Hart MRN: [id number] DOB: [birth year] Today's Date: 02/27/2024   History of Present Illness Pt is a 78 y/o F presenting to ED on 6/14 with SOB and BLE edema on 2L O2 chronically. Admitted for acute on chronic diastolic CHF. PMH includes COPD, CHF, A fib on Xarelto     PT Comments  Patient progressing with activity tolerance despite foot pain completing in room ambulation, functional standing task for toilet hygiene and able to stand with S.  She was on 4-6L O2 throughout with SpO2 range from 84-91%.  Patient will benefit from continued skilled PT and follow up HHPT at d/c.     If plan is discharge home, recommend the following: Assistance with cooking/housework;Assist for transportation   Can travel by private vehicle        Equipment Recommendations  None recommended by PT    Recommendations for Other Services       Precautions / Restrictions Precautions Precautions: Fall Recall of Precautions/Restrictions: Intact Precaution/Restrictions Comments: watch SpO2     Mobility  Bed Mobility Overal bed mobility: Modified Independent             General bed mobility comments: pulls up on rails, then moves legs off EOB, to supine A for lines only    Transfers Overall transfer level: Needs assistance Equipment used: Rolling walker (2 wheels) Transfers: Sit to/from Stand Sit to Stand: Supervision   Step pivot transfers: Contact guard assist       General transfer comment: up to RW S for lines; O2 management; transfer recliner to [facility] with RW and A for lines/safety, clothing management    Ambulation/Gait Ambulation/Gait assistance: Supervision Gait Distance (Feet): 10 Feet (x 2) Assistive device: Rolling walker (2 wheels) Gait Pattern/deviations: Step-to pattern, Step-through pattern, Trunk flexed       General Gait Details: continued foot pain, but agreed to walk around bed to recliner then back to opposite  side of bed for O2 measurement   Stairs             Wheelchair Mobility     Tilt Bed    Modified Rankin (Stroke Patients Only)       Balance Overall balance assessment: Needs assistance   Sitting balance-Leahy Scale: Good Sitting balance - Comments: completing toilet hygiene seated and standing   Standing balance support: No upper extremity supported Standing balance-Leahy Scale: Good Standing balance comment: completed perineal hygiene in standing with CGA to S                            Communication Communication Communication: No apparent difficulties  Cognition Arousal: Alert Behavior During Therapy: WFL for tasks assessed/performed   PT - Cognitive impairments: No apparent impairments                         Following commands: Intact      Cueing    Exercises      General Comments General comments (skin integrity, edema, etc.): SpO2 probe with intermittent poor pleth; on 6L O2 at rest, measuring 87-92%; with ambulation on 6L SpO2 varied 79% (with poor pleth, pt not in distress) to 90%; replaced probe and attempted mobility to [facility] on 4L O2 with SpO2 84% so titrated back to 6L for ambulation to EOB from [facility].  SpO2 87% -90%      Pertinent Vitals/Pain Pain Assessment Pain Score: 7  Pain Location:  ankles/feet Pain Descriptors / Indicators: Discomfort, Aching Pain Intervention(s): Monitored during session, Repositioned, Patient requesting pain meds-RN notified    Home Living                          Prior Function            PT Goals (current goals can now be found in the care plan section) Progress towards PT goals: Progressing toward goals    Frequency    Min 2X/week      PT Plan      Co-evaluation              AM-PAC PT 6 Clicks Mobility   Outcome Measure  Help needed turning from your back to your side while in a flat bed without using bedrails?: None Help needed moving from lying on your  back to sitting on the side of a flat bed without using bedrails?: None Help needed moving to and from a bed to a chair (including a wheelchair)?: A Little Help needed standing up from a chair using your arms (e.g., wheelchair or bedside chair)?: None Help needed to walk in hospital room?: A Little Help needed climbing 3-5 steps with a railing? : Total 6 Click Score: 19    End of Session Equipment Utilized During Treatment: Oxygen Activity Tolerance: Patient limited by pain Patient left: in bed;with call bell/phone within reach   PT Visit Diagnosis: Other abnormalities of gait and mobility (R26.89);Muscle weakness (generalized) (M62.81);Pain Pain - part of body: Ankle and joints of foot     Time: 1610-9604 PT Time Calculation (min) (ACUTE ONLY): 32 min  Charges:    $Gait Training: 8-22 mins $Therapeutic Activity: 8-22 mins PT General Charges $$ ACUTE PT VISIT: 1 Visit                     Sherri Hart, PT Acute Rehabilitation Services Office:[phone] 02/27/2024    Sherri Hart 02/27/2024, 6:04 PM

## 2024-02-28 ENCOUNTER — Inpatient Hospital Stay (HOSPITAL_COMMUNITY)

## 2024-02-28 DIAGNOSIS — I5033 Acute on chronic diastolic (congestive) heart failure: Secondary | ICD-10-CM | POA: Diagnosis not present

## 2024-02-28 LAB — BLOOD GAS, ARTERIAL
Acid-Base Excess: 17.2 mmol/L — ABNORMAL HIGH (ref 0.0–2.0)
Bicarbonate: 42 mmol/L — ABNORMAL HIGH (ref 20.0–28.0)
O2 Saturation: 91.7 %
Patient temperature: 38.5
pCO2 arterial: 51 mmHg — ABNORMAL HIGH (ref 32–48)
pH, Arterial: 7.53 — ABNORMAL HIGH (ref 7.35–7.45)
pO2, Arterial: 62 mmHg — ABNORMAL LOW (ref 83–108)

## 2024-02-28 LAB — BASIC METABOLIC PANEL WITH GFR
Anion gap: 18 — ABNORMAL HIGH (ref 5–15)
BUN: 47 mg/dL — ABNORMAL HIGH (ref 8–23)
CO2: 29 mmol/L (ref 22–32)
Calcium: 9.4 mg/dL (ref 8.9–10.3)
Chloride: 87 mmol/L — ABNORMAL LOW (ref 98–111)
Creatinine, Ser: 1.34 mg/dL — ABNORMAL HIGH (ref 0.44–1.00)
GFR, Estimated: 41 mL/min — ABNORMAL LOW (ref >60)
Glucose, Bld: 169 mg/dL — ABNORMAL HIGH (ref 70–99)
Potassium: 3.6 mmol/L (ref 3.5–5.1)
Sodium: 134 mmol/L — ABNORMAL LOW (ref 135–145)

## 2024-02-28 LAB — GLUCOSE, CAPILLARY: Glucose-Capillary: 125 mg/dL — ABNORMAL HIGH (ref 70–99)

## 2024-02-28 MED ORDER — IPRATROPIUM-ALBUTEROL 0.5-2.5 (3) MG/3ML IN SOLN
3.0000 mL | Freq: Once | RESPIRATORY_TRACT | Status: AC
  Administered 2024-02-28: 3 mL via RESPIRATORY_TRACT

## 2024-02-28 MED ORDER — ACETAMINOPHEN 650 MG RE SUPP: 650.0000 mg | Freq: Four times a day (QID) | RECTAL | Status: DC | PRN

## 2024-02-28 MED ORDER — SPIRONOLACTONE 25 MG PO TABS
25.0000 mg | ORAL_TABLET | Freq: Every day | ORAL | Status: DC
  Administered 2024-02-28 – 2024-03-01 (×3): 25 mg via ORAL
  Filled 2024-02-28 (×3): qty 1

## 2024-02-28 MED ORDER — VANCOMYCIN HCL 2000 MG/400ML IV SOLN
2000.0000 mg | Freq: Once | INTRAVENOUS | Status: AC
  Administered 2024-02-28: 2000 mg via INTRAVENOUS
  Filled 2024-02-28: qty 400

## 2024-02-28 MED ORDER — IPRATROPIUM-ALBUTEROL 0.5-2.5 (3) MG/3ML IN SOLN
3.0000 mL | Freq: Four times a day (QID) | RESPIRATORY_TRACT | Status: DC
  Administered 2024-02-28: 3 mL via RESPIRATORY_TRACT
  Filled 2024-02-28: qty 3

## 2024-02-28 MED ORDER — ACETAMINOPHEN 325 MG PO TABS
650.0000 mg | ORAL_TABLET | Freq: Four times a day (QID) | ORAL | Status: DC | PRN
  Administered 2024-02-28 – 2024-03-01 (×4): 650 mg via ORAL
  Filled 2024-02-28 (×4): qty 2

## 2024-02-28 MED ORDER — FUROSEMIDE 10 MG/ML IJ SOLN
40.0000 mg | Freq: Once | INTRAMUSCULAR | Status: AC
  Administered 2024-02-28: 40 mg via INTRAVENOUS
  Filled 2024-02-28: qty 4

## 2024-02-28 MED ORDER — SODIUM CHLORIDE 0.9 % IV SOLN
2.0000 g | Freq: Two times a day (BID) | INTRAVENOUS | Status: DC
  Administered 2024-02-28 – 2024-03-01 (×5): 2 g via INTRAVENOUS
  Filled 2024-02-28 (×5): qty 12.5

## 2024-02-28 MED ORDER — FUROSEMIDE 40 MG PO TABS
40.0000 mg | ORAL_TABLET | Freq: Every day | ORAL | Status: DC
  Administered 2024-02-28 – 2024-03-02 (×4): 40 mg via ORAL
  Filled 2024-02-28 (×4): qty 1

## 2024-02-28 MED ORDER — METHYLPREDNISOLONE SODIUM SUCC 125 MG IJ SOLR
125.0000 mg | Freq: Once | INTRAMUSCULAR | Status: AC
  Administered 2024-02-28: 125 mg via INTRAVENOUS
  Filled 2024-02-28: qty 2

## 2024-02-28 MED ORDER — VANCOMYCIN HCL IN DEXTROSE 1-5 GM/200ML-% IV SOLN
1000.0000 mg | INTRAVENOUS | Status: DC
  Administered 2024-02-29 – 2024-03-01 (×2): 1000 mg via INTRAVENOUS
  Filled 2024-02-28 (×2): qty 200

## 2024-02-28 MED ORDER — IPRATROPIUM-ALBUTEROL 0.5-2.5 (3) MG/3ML IN SOLN
3.0000 mL | Freq: Four times a day (QID) | RESPIRATORY_TRACT | Status: DC
  Administered 2024-02-28 – 2024-02-29 (×3): 3 mL via RESPIRATORY_TRACT
  Filled 2024-02-28 (×3): qty 3

## 2024-02-28 MED ORDER — METRONIDAZOLE 500 MG/100ML IV SOLN
500.0000 mg | Freq: Two times a day (BID) | INTRAVENOUS | Status: DC
  Administered 2024-02-28 – 2024-03-01 (×5): 500 mg via INTRAVENOUS
  Filled 2024-02-28 (×5): qty 100

## 2024-02-28 MED ORDER — FUROSEMIDE 10 MG/ML IJ SOLN
60.0000 mg | Freq: Once | INTRAMUSCULAR | Status: AC
  Administered 2024-02-28: 60 mg via INTRAVENOUS
  Filled 2024-02-28: qty 6

## 2024-02-28 NOTE — Plan of Care (Signed)
  Problem: Education: Goal: Knowledge of General Education information will improve Description: Including pain rating scale, medication(s)/side effects and non-pharmacologic comfort measures Outcome: Progressing   Problem: Clinical Measurements: Goal: Ability to maintain clinical measurements within normal limits will improve Outcome: Progressing Goal: Diagnostic test results will improve Outcome: Progressing Goal: Respiratory complications will improve Outcome: Progressing Goal: Cardiovascular complication will be avoided Outcome: Progressing   Problem: Activity: Goal: Risk for activity intolerance will decrease Outcome: Progressing   Problem: Pain Managment: Goal: General experience of comfort will improve and/or be controlled Outcome: Progressing

## 2024-02-28 NOTE — Progress Notes (Signed)
 PROGRESS NOTE    Sherri Hart Block  FMW:[id number] DOB: 05-Aug-1946 DOA: 02/21/2024 PCP: Maree Leni Edyth DELENA, MD  77/F w atrial fibrillation, COPD, diastolic CHF, and obesity who presented with dyspnea, wheezing and lower extremity edema for the last 3 days. EMS was called, > respiratory distress,  In ED, 02 saturation 86%. diffuse bilateral wheezing, and positive lower extremity edema. CXR w bilateral hilar vascular congestion, with bilateral central interstitial infiltrates. 6/16 responding well to diuresis.  Subjective: Feels well overall, oxygen requirement still remain elevated, denies any dyspnea, denies cough or orthopnea  Assessment and Plan:  Acute on chronic diastolic CHF ) -Echo with EF 60 to 65%, , RV mild reduction, RA with severe dilatation,  - Improved with diuresis, 11.7 L negative, clinically appears euvolemic, repeat x-ray - Continue Jardiance  and Aldactone , resume oral diuretics tomorrow  -Attempt to wean O2, incentive spirometry, flutter valve, add DuoNebs  Acute hypoxemic respiratory failure, on chronic hypoxemia,  - Due to underlying advanced COPD and atelectasis, CHF - Pulm status has improved, however still remains moderately hypoxic, incentive spirometer, flutter valve, add DuoNebs - Out of bed to chair, continue attempts at weaning O2  Left ankle pain, swelling - Suspect gout flare, uric acid elevated - Prednisone  X2 days, improved  Permanent atrial fibrillation (HCC) Continue diltiazem  and rivaroxaban .   COPD with acute exacerbation (HCC) Clinically improved.  Will continue bronchodilator therapy, ICS and LABA.   Obesity, class 3 Calculated BMI is 43,7   Osteoarthritis, PT and OT.   Nicotine  dependence, cigarettes, uncomplicated Smoking cessation counseling.    DVT prophylaxis: xarelto   Code Status: Full Code Family Communication: None present Disposition Plan: Home likely tomorrow    Objective: Vitals:   02/28/24 0723 02/28/24 0809  02/28/24 0900 02/28/24 1000  BP: (!) 128/55     Pulse: 92 96 (!) 105 92  Resp: (!) 22 (!) 21 (!) 23 (!) 23  Temp: 98.9 F (37.2 C)     TempSrc: Oral     SpO2: 93% (!) 88% (!) 88% (!) 85%  Weight:      Height:        Intake/Output Summary (Last 24 hours) at 02/28/2024 1049 Last data filed at 02/28/2024 9076 Gross per 24 hour  Intake 840 ml  Output 1600 ml  Net -760 ml   Filed Weights   02/26/24 0309 02/27/24 0500 02/28/24 0500  Weight: 117.4 kg 115.3 kg 115.9 kg    Examination:  General exam: Pleasant elderly female sitting up in bed, AO x 3 HEENT: No JVD CVS: S1-S2, irregular rhythm Lungs: Improving air movement, clear today Abdomen: Soft, nontender, bowel sounds present Extremities: Trace edema Skin: No rashes Psychiatry:  Mood & affect appropriate.     Data Reviewed:   CBC: Recent Labs  Lab 02/21/24 1507  WBC 7.4  NEUTROABS 5.2  HGB 13.8  HCT 44.8  MCV 106.9*  PLT 181   Basic Metabolic Panel: Recent Labs  Lab 02/23/24 0235 02/24/24 0314 02/25/24 0244 02/26/24 0350 02/27/24 0229 02/28/24 0221  NA 139 140 138 136 135 134*  K 4.1 4.1 3.9 4.0 4.1 3.6  CL 97* 95* 92* 89* 87* 87*  CO2 31 35* 35* 35* 38* 29  GLUCOSE 164* 108* 106* 158* 137* 169*  BUN 30* 40* 39* 38* 50* 47*  CREATININE 1.28* 1.50* 1.24* 1.21* 1.60* 1.34*  CALCIUM 8.7* 8.7* 8.9 9.1 9.1 9.4  MG 2.0 2.1 2.1  --   --   --    GFR: Estimated  Creatinine Clearance: 46.9 mL/min (A) (by C-G formula based on SCr of 1.34 mg/dL (H)). Liver Function Tests: Recent Labs  Lab 02/21/24 1507  AST 18  ALT 11  ALKPHOS 60  BILITOT 0.9  PROT 7.2  ALBUMIN 3.9   No results for input(s): LIPASE, AMYLASE in the last 168 hours. No results for input(s): AMMONIA in the last 168 hours. Coagulation Profile: No results for input(s): INR, PROTIME in the last 168 hours. Cardiac Enzymes: No results for input(s): CKTOTAL, CKMB, CKMBINDEX, TROPONINI in the last 168 hours. BNP (last 3  results) No results for input(s): PROBNP in the last 8760 hours. HbA1C: No results for input(s): HGBA1C in the last 72 hours. CBG: No results for input(s): GLUCAP in the last 168 hours. Lipid Profile: No results for input(s): CHOL, HDL, LDLCALC, TRIG, CHOLHDL, LDLDIRECT in the last 72 hours. Thyroid Function Tests: No results for input(s): TSH, T4TOTAL, FREET4, T3FREE, THYROIDAB in the last 72 hours. Anemia Panel: No results for input(s): VITAMINB12, FOLATE, FERRITIN, TIBC, IRON, RETICCTPCT in the last 72 hours. Urine analysis:    Component Value Date/Time   COLORURINE YELLOW 02/26/2023 1031   APPEARANCEUR CLEAR 02/26/2023 1031   LABSPEC 1.024 02/26/2023 1031   PHURINE 5.0 02/26/2023 1031   GLUCOSEU NEGATIVE 02/26/2023 1031   HGBUR NEGATIVE 02/26/2023 1031   BILIRUBINUR NEGATIVE 02/26/2023 1031   KETONESUR NEGATIVE 02/26/2023 1031   PROTEINUR 30 (A) 02/26/2023 1031   NITRITE POSITIVE (A) 02/26/2023 1031   LEUKOCYTESUR NEGATIVE 02/26/2023 1031   Sepsis Labs: @LABRCNTIP (procalcitonin:4,lacticidven:4)  ) Recent Results (from the past 240 hours)  MRSA Next Gen by PCR, Nasal     Status: None   Collection Time: 02/21/24  8:16 PM   Specimen: Nasal Mucosa; Nasal Swab  Result Value Ref Range Status   MRSA by PCR Next Gen NOT DETECTED NOT DETECTED Final    Comment: (NOTE) The GeneXpert MRSA Assay (FDA approved for NASAL specimens only), is one component of a comprehensive MRSA colonization surveillance program. It is not intended to diagnose MRSA infection nor to guide or monitor treatment for MRSA infections. Test performance is not FDA approved in patients less than 28 years old. Performed at [facility], [address] [place], [place] [address]      Radiology Studies: DG CHEST PORT 1 VIEW Result Date: 02/28/2024 CLINICAL DATA:  Hypoxia EXAM: PORTABLE CHEST 1 VIEW COMPARISON:  Chest radiograph dated 02/21/2024 FINDINGS: Normal  lung volumes. Similar diffuse interstitial opacities. Increased left mid and lower lung linear opacities. Bibasilar patchy opacities. No pleural effusion or pneumothorax. Similar enlarged cardiomediastinal silhouette. Cervical spinal fixation hardware appears intact. IMPRESSION: 1. Similar diffuse interstitial opacities, which may represent pulmonary edema. 2. Increased left mid and lower lung linear and bibasilar patchy opacities, likely atelectasis. Electronically Signed   By: Limin  Xu M.D.   On: 02/28/2024 08:07     Scheduled Meds:  diltiazem   180 mg Oral QHS   empagliflozin   10 mg Oral Daily   fluticasone  furoate-vilanterol  1 puff Inhalation Daily   furosemide   40 mg Intravenous Once   furosemide   40 mg Oral Daily   guaiFENesin   600 mg Oral BID   ipratropium-albuterol   3 mL Nebulization Q6H   nicotine   21 mg Transdermal Q2000   pravastatin   20 mg Oral QHS   rivaroxaban   20 mg Oral Q supper   sodium chloride  flush  3 mL Intravenous Q12H   spironolactone   25 mg Oral Daily   Continuous Infusions:  LOS: 7 days    Time spent:    Sigurd Pac, MD [facility]   02/28/2024, 10:49 AM

## 2024-02-28 NOTE — Significant Event (Addendum)
 Rapid Response Event Note   Reason for Call :  Called at 2024 for fever, lethargy, increased oxygen demand. At that time, RN reports pt arousable and can answer questions, though sleepy, and pt's SpO2 on 11L HFNC is 90%. MD paged by RN and she is awaiting response.   RT to room and placed pt on NRB(SpO2-100%) then able to titrate down to .50 VM(SpO2- 99%)  MD responded and orders given as below.  Pt seen by RRT at 2048.  Initial Focused Assessment:  Pt lying in bed with eyes open. She is awake, alert, and oriented, able to follow commands, and move all extremities. She is c/o SOB and generalized pain/achiness. Lungs with scattered crackles t/o. ABD soft/NT. Skin hot to touch, dry.   T-101.3, HR-96, BP-128/67, RR-24, SpO2-93%  on .50 VM.   Interventions:  NRB>VM CBG-125 ABG: 7.53/51/62/42 PCXR-Marginally improved perihilar vascular congestion.  Lasix  60mg  IV Solu-medrol  125mg  IV Duo-neb tx Tylenol  650mg  PO U/A with cx, BC x 2,  Maxipime , Vanc, Flagyl (give after Abx)  Plan of Care:  Pt looks better after interventions. She awakes easily and says her SOB is better. Allow meds further time to work. Give antibiotics after BCs drawn. Await lab results. May titrate FiO2 as SpO2 and WOB allow. Please call RRT if further assistance needed.   Event Summary:   MD Notified: Dr. Sherlon notified PTA RRT Call Time:2024 Arrival Time:2048 End Upfz:[id number]  Tish Graeme Piety, RN

## 2024-02-28 NOTE — Progress Notes (Signed)
 Pharmacy Antibiotic Note  Sherri Hart is a 78 y.o. female admitted on 02/21/2024 with HF exacerbation.  Today is hospital day #7 and patient developed a temp of 101.3. Starting empiric antibiotics for pneumonia coverage.  Pharmacy has been consulted for Vancomycin  and Cefepime  dosing.    Plan:  Vancomycin  2gm IV x 1 dose, then 1gm IV q24h (SCr 1.34, Vd 0.5, eAUC 529) Cefepime  2gm IV q12h Flagyl  500 IV q12h   Height: 5' 6 (167.6 cm) Weight: 115.9 kg (255 lb 8.2 oz) IBW/kg (Calculated) : 59.3  Temp (24hrs), Avg:98.8 F (37.1 C), Min:97.6 F (36.4 C), Max:101.3 F (38.5 C)  Recent Labs  Lab 02/24/24 0314 02/25/24 0244 02/26/24 0350 02/27/24 0229 02/28/24 0221  CREATININE 1.50* 1.24* 1.21* 1.60* 1.34*    Estimated Creatinine Clearance: 46.9 mL/min (A) (by C-G formula based on SCr of 1.34 mg/dL (H)).    Allergies  Allergen Reactions   Aspirin Other (See Comments)    Stomach ache    Penicillins Swelling and Other (See Comments)    Swollen lips and welts + Pt stated that she can take keflex ++ Did it involve swelling of the face/tongue/throat, SOB, or low BP? Yes Did it involve sudden or severe rash/hives, skin peeling, or any reaction on the inside of your mouth or nose? NO Did you need to seek medical attention at a hospital or doctor's office? Y When did it last happen? childhood    Antimicrobials this admission: Vanc 6/21 >>  Cefepime  6/21 >>  Flagyl  6/21 >>  Dose adjustments this admission:   Microbiology results: 6/21 BCx: pending 6/21 UCx: pending  6/14 MRSA PCR: not detected  Thank you for allowing pharmacy to be a part of this patient's care.  [employer], Pharm.D., BCPS Clinical Pharmacist  **Pharmacist phone directory can be found on [email or web address] listed under [facility].  02/28/2024 8:45 PM

## 2024-02-29 DIAGNOSIS — I5033 Acute on chronic diastolic (congestive) heart failure: Secondary | ICD-10-CM | POA: Diagnosis not present

## 2024-02-29 LAB — URINALYSIS, ROUTINE W REFLEX MICROSCOPIC
Bacteria, UA: NONE SEEN
Bilirubin Urine: NEGATIVE
Glucose, UA: >=500 mg/dL — AB
Hgb urine dipstick: NEGATIVE
Ketones, ur: NEGATIVE mg/dL
Leukocytes,Ua: NEGATIVE
Nitrite: NEGATIVE
Protein, ur: NEGATIVE mg/dL
Specific Gravity, Urine: 1.021 (ref 1.005–1.030)
pH: 5 (ref 5.0–8.0)

## 2024-02-29 LAB — RESPIRATORY PANEL BY PCR

## 2024-02-29 LAB — CBC
HCT: 48.7 % — ABNORMAL HIGH (ref 36.0–46.0)
Hemoglobin: 15.9 g/dL — ABNORMAL HIGH (ref 12.0–15.0)
MCH: 32.6 pg (ref 26.0–34.0)
MCHC: 32.6 g/dL (ref 30.0–36.0)
MCV: 99.8 fL (ref 80.0–100.0)
Platelets: 234 10*3/uL (ref 150–400)
RBC: 4.88 MIL/uL (ref 3.87–5.11)
RDW: 14.5 % (ref 11.5–15.5)
WBC: 15 10*3/uL — ABNORMAL HIGH (ref 4.0–10.5)
nRBC: 0 % (ref 0.0–0.2)

## 2024-02-29 LAB — BASIC METABOLIC PANEL WITH GFR
Anion gap: 14 (ref 5–15)
BUN: 48 mg/dL — ABNORMAL HIGH (ref 8–23)
CO2: 29 mmol/L (ref 22–32)
Calcium: 8.5 mg/dL — ABNORMAL LOW (ref 8.9–10.3)
Chloride: 91 mmol/L — ABNORMAL LOW (ref 98–111)
Creatinine, Ser: 1.53 mg/dL — ABNORMAL HIGH (ref 0.44–1.00)
GFR, Estimated: 35 mL/min — ABNORMAL LOW (ref >60)
Glucose, Bld: 240 mg/dL — ABNORMAL HIGH (ref 70–99)
Potassium: 3.7 mmol/L (ref 3.5–5.1)
Sodium: 134 mmol/L — ABNORMAL LOW (ref 135–145)

## 2024-02-29 LAB — RESP PANEL BY RT-PCR (RSV, FLU A&B, COVID)  RVPGX2
Influenza A by PCR: NEGATIVE
Influenza B by PCR: NEGATIVE
Resp Syncytial Virus by PCR: NEGATIVE
SARS Coronavirus 2 by RT PCR: NEGATIVE

## 2024-02-29 MED ORDER — HYDROCODONE-ACETAMINOPHEN 5-325 MG PO TABS
1.0000 | ORAL_TABLET | Freq: Four times a day (QID) | ORAL | Status: DC | PRN
  Administered 2024-02-29 – 2024-03-02 (×7): 2 via ORAL
  Filled 2024-02-29 (×8): qty 2

## 2024-02-29 NOTE — Progress Notes (Signed)
 PROGRESS NOTE    Sherri Hart Block  FMW:[id number] DOB: 1946/08/27 DOA: 02/21/2024 PCP: Maree Leni Edyth DELENA, MD  78/F w atrial fibrillation, COPD, diastolic CHF, and obesity who presented with dyspnea, wheezing and lower extremity edema for the last 3 days. EMS was called, > respiratory distress,  In ED, 02 saturation 86%. diffuse bilateral wheezing, and positive lower extremity edema. CXR w bilateral hilar vascular congestion, with bilateral central interstitial infiltrates. 6/16 responding well to diuresis. - 6/18-6/21 with persistent hypoxia - 6/22 overnight: Fever of 102 with worsening respiratory distress, hypoxia and encephalopathy  Subjective: -Rough night with fever shortness of breath, denies cough or congestion, denies urinary symptoms, mild congestion  Assessment and Plan:  Acute on chronic diastolic CHF ) -Echo with EF 60 to 65%, , RV mild reduction, RA with severe dilatation,  - Improved with diuresis, 12 L negative -Restart oral Lasix  today, continue Jardiance  and Aldactone  -Attempt to wean O2, incentive spirometry, flutter valve, add DuoNebs  Fever, SIRS - Unclear source, temp of 102 overnight with respiratory distress - Started broad-spectrum antibiotics, pulmonary symptoms are largely unchanged, x-ray unremarkable, no other localizing symptoms - Check respiratory virus panel, follow-up blood cultures, check procalcitonin and CRP in a.m.  Acute hypoxemic respiratory failure, on chronic hypoxemia,  - Due to underlying advanced COPD and atelectasis, CHF - Pulm status has improved, however still remains moderately hypoxic, incentive spirometer, flutter valve, add DuoNebs - Out of bed to chair, continue attempts at weaning O2  Left ankle pain, swelling - Suspect gout flare, uric acid elevated - Prednisone  X2 days, improved  Permanent atrial fibrillation (HCC) Continue diltiazem  and rivaroxaban .   COPD with acute exacerbation (HCC) Clinically improved.  Will  continue bronchodilator therapy, ICS and LABA.   Obesity, class 3 Calculated BMI is 43,7   Osteoarthritis, PT and OT.   Nicotine  dependence, cigarettes, uncomplicated Smoking cessation counseling.    DVT prophylaxis: xarelto   Code Status: Full Code Family Communication: None present Disposition Plan: Home pending improvement in hypoxia    Objective: Vitals:   02/28/24 2331 02/29/24 0022 02/29/24 0311 02/29/24 0709  BP: (!) 109/57 129/70 105/66 137/89  Pulse: 99 95 76   Resp: 19 20 16    Temp: 98.5 F (36.9 C) 100.2 F (37.9 C) 98.2 F (36.8 C) 98.2 F (36.8 C)  TempSrc: Oral Axillary  Oral  SpO2: (!) 89% (!) 89% 90%   Weight:      Height:        Intake/Output Summary (Last 24 hours) at 02/29/2024 0947 Last data filed at 02/29/2024 0347 Gross per 24 hour  Intake 440 ml  Output 1300 ml  Net -860 ml   Filed Weights   02/26/24 0309 02/27/24 0500 02/28/24 0500  Weight: 117.4 kg 115.3 kg 115.9 kg    Examination:  General exam: Pleasant elderly female sitting up in bed, AO x 3 HEENT: No JVD CVS: S1-S2, irregular rhythm Lungs: Few conducted upper airway sounds, improved air movement, decreased at the bases Abdomen: Soft, nontender, bowel sounds present Extremities: Trace edema Skin: No rashes Psychiatry:  Mood & affect appropriate.     Data Reviewed:   CBC: Recent Labs  Lab 02/29/24 0911  WBC 15.0*  HGB 15.9*  HCT 48.7*  MCV 99.8  PLT 234   Basic Metabolic Panel: Recent Labs  Lab 02/23/24 0235 02/24/24 0314 02/25/24 0244 02/26/24 0350 02/27/24 0229 02/28/24 0221 02/29/24 0217  NA 139 140 138 136 135 134* 134*  K 4.1 4.1 3.9 4.0 4.1 3.6  3.7  CL 97* 95* 92* 89* 87* 87* 91*  CO2 31 35* 35* 35* 38* 29 29  GLUCOSE 164* 108* 106* 158* 137* 169* 240*  BUN 30* 40* 39* 38* 50* 47* 48*  CREATININE 1.28* 1.50* 1.24* 1.21* 1.60* 1.34* 1.53*  CALCIUM 8.7* 8.7* 8.9 9.1 9.1 9.4 8.5*  MG 2.0 2.1 2.1  --   --   --   --    GFR: Estimated Creatinine  Clearance: 41.1 mL/min (A) (by C-G formula based on SCr of 1.53 mg/dL (H)). Liver Function Tests: No results for input(s): AST, ALT, ALKPHOS, BILITOT, PROT, ALBUMIN in the last 168 hours.  No results for input(s): LIPASE, AMYLASE in the last 168 hours. No results for input(s): AMMONIA in the last 168 hours. Coagulation Profile: No results for input(s): INR, PROTIME in the last 168 hours. Cardiac Enzymes: No results for input(s): CKTOTAL, CKMB, CKMBINDEX, TROPONINI in the last 168 hours. BNP (last 3 results) No results for input(s): PROBNP in the last 8760 hours. HbA1C: No results for input(s): HGBA1C in the last 72 hours. CBG: Recent Labs  Lab 02/28/24 2048  GLUCAP 125*   Lipid Profile: No results for input(s): CHOL, HDL, LDLCALC, TRIG, CHOLHDL, LDLDIRECT in the last 72 hours. Thyroid Function Tests: No results for input(s): TSH, T4TOTAL, FREET4, T3FREE, THYROIDAB in the last 72 hours. Anemia Panel: No results for input(s): VITAMINB12, FOLATE, FERRITIN, TIBC, IRON, RETICCTPCT in the last 72 hours. Urine analysis:    Component Value Date/Time   COLORURINE YELLOW 02/26/2023 1031   APPEARANCEUR CLEAR 02/26/2023 1031   LABSPEC 1.024 02/26/2023 1031   PHURINE 5.0 02/26/2023 1031   GLUCOSEU NEGATIVE 02/26/2023 1031   HGBUR NEGATIVE 02/26/2023 1031   BILIRUBINUR NEGATIVE 02/26/2023 1031   KETONESUR NEGATIVE 02/26/2023 1031   PROTEINUR 30 (A) 02/26/2023 1031   NITRITE POSITIVE (A) 02/26/2023 1031   LEUKOCYTESUR NEGATIVE 02/26/2023 1031   Sepsis Labs: @LABRCNTIP (procalcitonin:4,lacticidven:4)  ) Recent Results (from the past 240 hours)  MRSA Next Gen by PCR, Nasal     Status: None   Collection Time: 02/21/24  8:16 PM   Specimen: Nasal Mucosa; Nasal Swab  Result Value Ref Range Status   MRSA by PCR Next Gen NOT DETECTED NOT DETECTED Final    Comment: (NOTE) The GeneXpert MRSA Assay (FDA approved for NASAL  specimens only), is one component of a comprehensive MRSA colonization surveillance program. It is not intended to diagnose MRSA infection nor to guide or monitor treatment for MRSA infections. Test performance is not FDA approved in patients less than 31 years old. Performed at [facility], [address], [place], [place] [address]   Culture, blood (Routine X 2) w Reflex to ID Panel     Status: None (Preliminary result)   Collection Time: 02/28/24  9:31 PM   Specimen: BLOOD LEFT ARM  Result Value Ref Range Status   Specimen Description BLOOD LEFT ARM  Final   Special Requests   Final    BOTTLES DRAWN AEROBIC AND ANAEROBIC Blood Culture results may not be optimal due to an inadequate volume of blood received in culture bottles   Culture   Final    NO GROWTH < 12 HOURS Performed at [facility], [address] [place], [place] [address]    Report Status PENDING  Incomplete  Culture, blood (Routine X 2) w Reflex to ID Panel     Status: None (Preliminary result)   Collection Time: 02/28/24  9:52 PM   Specimen:  BLOOD RIGHT HAND  Result Value Ref Range Status   Specimen Description BLOOD RIGHT HAND  Final   Special Requests   Final    BOTTLES DRAWN AEROBIC AND ANAEROBIC Blood Culture results may not be optimal due to an inadequate volume of blood received in culture bottles   Culture   Final    NO GROWTH < 12 HOURS Performed at [facility], [address], [place], [place] [address]    Report Status PENDING  Incomplete     Radiology Studies: DG Chest Port 1 View Result Date: 02/28/2024 CLINICAL DATA:  Hypoxia, respiratory distress EXAM: PORTABLE CHEST - 1 VIEW COMPARISON:  02/28/2024 FINDINGS: Perihilar vascular congestion marginally improved. No new airspace disease. Stable cardiomegaly. Aortic Atherosclerosis (ICD10-170.0). No effusion. Cervical fixation hardware partially visualized. IMPRESSION: Marginally improved perihilar vascular congestion. Electronically  Signed   By: JONETTA Faes M.D.   On: 02/28/2024 20:58   DG CHEST PORT 1 VIEW Result Date: 02/28/2024 CLINICAL DATA:  Hypoxia EXAM: PORTABLE CHEST 1 VIEW COMPARISON:  Chest radiograph dated 02/21/2024 FINDINGS: Normal lung volumes. Similar diffuse interstitial opacities. Increased left mid and lower lung linear opacities. Bibasilar patchy opacities. No pleural effusion or pneumothorax. Similar enlarged cardiomediastinal silhouette. Cervical spinal fixation hardware appears intact. IMPRESSION: 1. Similar diffuse interstitial opacities, which may represent pulmonary edema. 2. Increased left mid and lower lung linear and bibasilar patchy opacities, likely atelectasis. Electronically Signed   By: Limin  Xu M.D.   On: 02/28/2024 08:07     Scheduled Meds:  diltiazem   180 mg Oral QHS   empagliflozin   10 mg Oral Daily   fluticasone  furoate-vilanterol  1 puff Inhalation Daily   furosemide   40 mg Oral Daily   guaiFENesin   600 mg Oral BID   ipratropium-albuterol   3 mL Nebulization Q6H   nicotine   21 mg Transdermal Q2000   pravastatin   20 mg Oral QHS   rivaroxaban   20 mg Oral Q supper   sodium chloride  flush  3 mL Intravenous Q12H   spironolactone   25 mg Oral Daily   Continuous Infusions:  ceFEPime  (MAXIPIME ) IV 2 g (02/29/24 0929)   metronidazole  500 mg (02/29/24 0929)   vancomycin        LOS: 8 days    Time spent:    Sigurd Pac, MD [facility]   02/29/2024, 9:47 AM

## 2024-02-29 NOTE — Progress Notes (Signed)
 Patient with increased work of breathing, this has improved with IV steroids, and IV lasix , has fever 101.3, started on broad-spectrum antibiotics, blood cultures were sent, UA was sent.  Chest x-ray with improved vascular congestion. Brayton Squire MD

## 2024-02-29 NOTE — Plan of Care (Signed)
  Problem: Clinical Measurements: Goal: Respiratory complications will improve Outcome: Progressing   Problem: Activity: Goal: Risk for activity intolerance will decrease Outcome: Progressing   Problem: Nutrition: Goal: Adequate nutrition will be maintained Outcome: Progressing   

## 2024-02-29 NOTE — Plan of Care (Signed)
  Problem: Education: Goal: Knowledge of General Education information will improve Description: Including pain rating scale, medication(s)/side effects and non-pharmacologic comfort measures Outcome: Progressing   Problem: Clinical Measurements: Goal: Diagnostic test results will improve Outcome: Progressing Goal: Respiratory complications will improve Outcome: Progressing   Problem: Pain Managment: Goal: General experience of comfort will improve and/or be controlled Outcome: Progressing

## 2024-03-01 DIAGNOSIS — I5033 Acute on chronic diastolic (congestive) heart failure: Secondary | ICD-10-CM | POA: Diagnosis not present

## 2024-03-01 LAB — CBC
HCT: 44.8 % (ref 36.0–46.0)
Hemoglobin: 14.3 g/dL (ref 12.0–15.0)
MCH: 31.9 pg (ref 26.0–34.0)
MCHC: 31.9 g/dL (ref 30.0–36.0)
MCV: 100 fL (ref 80.0–100.0)
Platelets: 260 10*3/uL (ref 150–400)
RBC: 4.48 MIL/uL (ref 3.87–5.11)
RDW: 14.3 % (ref 11.5–15.5)
WBC: 20.5 10*3/uL — ABNORMAL HIGH (ref 4.0–10.5)
nRBC: 0 % (ref 0.0–0.2)

## 2024-03-01 LAB — C-REACTIVE PROTEIN: CRP: 13.1 mg/dL — ABNORMAL HIGH (ref <1.0)

## 2024-03-01 LAB — BASIC METABOLIC PANEL WITH GFR
Anion gap: 13 (ref 5–15)
BUN: 54 mg/dL — ABNORMAL HIGH (ref 8–23)
CO2: 28 mmol/L (ref 22–32)
Calcium: 8.5 mg/dL — ABNORMAL LOW (ref 8.9–10.3)
Chloride: 93 mmol/L — ABNORMAL LOW (ref 98–111)
Creatinine, Ser: 1.42 mg/dL — ABNORMAL HIGH (ref 0.44–1.00)
GFR, Estimated: 39 mL/min — ABNORMAL LOW (ref >60)
Glucose, Bld: 218 mg/dL — ABNORMAL HIGH (ref 70–99)
Potassium: 3.7 mmol/L (ref 3.5–5.1)
Sodium: 134 mmol/L — ABNORMAL LOW (ref 135–145)

## 2024-03-01 LAB — PROCALCITONIN: Procalcitonin: 0.38 ng/mL

## 2024-03-01 MED ORDER — POTASSIUM CHLORIDE CRYS ER 20 MEQ PO TBCR
40.0000 meq | EXTENDED_RELEASE_TABLET | Freq: Once | ORAL | Status: AC
  Administered 2024-03-01: 40 meq via ORAL
  Filled 2024-03-01: qty 2

## 2024-03-01 NOTE — Progress Notes (Signed)
 PROGRESS NOTE    Sherri Hart  FMW:[id number] DOB: [birth year] DOA: 02/21/2024 PCP: Maree Leni Edyth DELENA, MD  77/F w atrial fibrillation, COPD, diastolic CHF, and obesity who presented with dyspnea, wheezing and lower extremity edema for the last 3 days. EMS was called, > respiratory distress,  In ED, 02 saturation 86%. diffuse bilateral wheezing, and positive lower extremity edema. CXR w bilateral hilar vascular congestion, with bilateral central interstitial infiltrates. 6/16 responding well to diuresis. - 6/18-6/21 with persistent hypoxia - 6/22 overnight: Fever of 102 with worsening respiratory distress, hypoxia and encephalopathy  Subjective: - Feels better, no further fevers, denies dyspnea, on 5 L O2 this morning  Assessment and Plan:  Acute on chronic diastolic CHF ) -Echo with EF 60 to 65%, , RV mild reduction, RA with severe dilatation,  - Improved with diuresis, 12 L negative - Transition to oral Lasix , continue Jardiance  and Aldactone  -Attempt to wean O2, incentive spirometry, flutter valve, DuoNebs  Fever, SIRS - Unclear source, temp of 102 on 6/22 AM with respiratory distress - Started broad-spectrum antibiotics, pulmonary symptoms are largely unchanged, x-ray unremarkable, no other localizing symptoms - WBC higher today, could be from recent steroids, blood cultures negative so far, flu COVID and respiratory virus panel were negative, UA unremarkable - Continue broad-spectrum antibiotics 1 more day  Acute hypoxemic respiratory failure, on chronic hypoxemia,  - Due to underlying advanced COPD and atelectasis, CHF - Pulm status has improved, however still remains moderately hypoxic, incentive spirometer, flutter valve, add DuoNebs - Out of bed to chair, continue attempts at weaning O2  Left ankle pain, swelling - Suspect gout flare, uric acid elevated - sp Prednisone  X2 days, improving  Permanent atrial fibrillation (HCC) Continue diltiazem  and rivaroxaban .    COPD with acute exacerbation (HCC) Clinically improved.  Will continue bronchodilator therapy, ICS and LABA.   Obesity, class 3 Calculated BMI is 43,7   Osteoarthritis, PT and OT.   Nicotine  dependence, cigarettes, uncomplicated Smoking cessation counseling.    DVT prophylaxis: xarelto   Code Status: Full Code Family Communication: None present Disposition Plan: Home pending improvement in hypoxia    Objective: Vitals:   02/29/24 2321 03/01/24 0551 03/01/24 0715 03/01/24 0808  BP: 118/77 114/81 115/88   Pulse: 82 84 80   Resp: 14 15 19    Temp: (!) 97.5 F (36.4 C) 97.6 F (36.4 C) 97.6 F (36.4 C)   TempSrc: Oral Oral Oral   SpO2: 94% 94% (!) 86% 93%  Weight:  115.2 kg    Height:        Intake/Output Summary (Last 24 hours) at 03/01/2024 0933 Last data filed at 03/01/2024 0844 Gross per 24 hour  Intake 930 ml  Output 900 ml  Net 30 ml   Filed Weights   02/27/24 0500 02/28/24 0500 03/01/24 0551  Weight: 115.3 kg 115.9 kg 115.2 kg    Examination:  General exam: Pleasant elderly female sitting up in bed, AO x 3 HEENT: No JVD S1-S2, irregular rhythm Lungs: Improved air movement, decreased breath sounds at the bases Abdomen: Soft, nontender, bowel sounds present Extremities: Trace edema, mild left ankle swelling Skin: No rashes Psychiatry:  Mood & affect appropriate.     Data Reviewed:   CBC: Recent Labs  Lab 02/29/24 0911 03/01/24 0217  WBC 15.0* 20.5*  HGB 15.9* 14.3  HCT 48.7* 44.8  MCV 99.8 100.0  PLT 234 260   Basic Metabolic Panel: Recent Labs  Lab 02/24/24 0314 02/25/24 0244 02/26/24 0350 02/27/24 0229 02/28/24  0221 02/29/24 0217 03/01/24 0217  NA 140 138 136 135 134* 134* 134*  K 4.1 3.9 4.0 4.1 3.6 3.7 3.7  CL 95* 92* 89* 87* 87* 91* 93*  CO2 35* 35* 35* 38* 29 29 28   GLUCOSE 108* 106* 158* 137* 169* 240* 218*  BUN 40* 39* 38* 50* 47* 48* 54*  CREATININE 1.50* 1.24* 1.21* 1.60* 1.34* 1.53* 1.42*  CALCIUM 8.7* 8.9 9.1 9.1  9.4 8.5* 8.5*  MG 2.1 2.1  --   --   --   --   --    GFR: Estimated Creatinine Clearance: 44.2 mL/min (A) (by C-G formula based on SCr of 1.42 mg/dL (H)). Liver Function Tests: No results for input(s): AST, ALT, ALKPHOS, BILITOT, PROT, ALBUMIN in the last 168 hours.  No results for input(s): LIPASE, AMYLASE in the last 168 hours. No results for input(s): AMMONIA in the last 168 hours. Coagulation Profile: No results for input(s): INR, PROTIME in the last 168 hours. Cardiac Enzymes: No results for input(s): CKTOTAL, CKMB, CKMBINDEX, TROPONINI in the last 168 hours. BNP (last 3 results) No results for input(s): PROBNP in the last 8760 hours. HbA1C: No results for input(s): HGBA1C in the last 72 hours. CBG: Recent Labs  Lab 02/28/24 2048  GLUCAP 125*   Lipid Profile: No results for input(s): CHOL, HDL, LDLCALC, TRIG, CHOLHDL, LDLDIRECT in the last 72 hours. Thyroid Function Tests: No results for input(s): TSH, T4TOTAL, FREET4, T3FREE, THYROIDAB in the last 72 hours. Anemia Panel: No results for input(s): VITAMINB12, FOLATE, FERRITIN, TIBC, IRON, RETICCTPCT in the last 72 hours. Urine analysis:    Component Value Date/Time   COLORURINE YELLOW 02/29/2024 0934   APPEARANCEUR CLEAR 02/29/2024 0934   LABSPEC 1.021 02/29/2024 0934   PHURINE 5.0 02/29/2024 0934   GLUCOSEU >=500 (A) 02/29/2024 0934   HGBUR NEGATIVE 02/29/2024 0934   BILIRUBINUR NEGATIVE 02/29/2024 0934   KETONESUR NEGATIVE 02/29/2024 0934   PROTEINUR NEGATIVE 02/29/2024 0934   NITRITE NEGATIVE 02/29/2024 0934   LEUKOCYTESUR NEGATIVE 02/29/2024 0934   Sepsis Labs: @LABRCNTIP (procalcitonin:4,lacticidven:4)  ) Recent Results (from the past 240 hours)  MRSA Next Gen by PCR, Nasal     Status: None   Collection Time: 02/21/24  8:16 PM   Specimen: Nasal Mucosa; Nasal Swab  Result Value Ref Range Status   MRSA by PCR Next Gen NOT DETECTED NOT  DETECTED Final    Comment: (NOTE) The GeneXpert MRSA Assay (FDA approved for NASAL specimens only), is one component of a comprehensive MRSA colonization surveillance program. It is not intended to diagnose MRSA infection nor to guide or monitor treatment for MRSA infections. Test performance is not FDA approved in patients less than 89 years old. Performed at [facility], [address] [place], [place] [address]   Culture, blood (Routine X 2) w Reflex to ID Panel     Status: None (Preliminary result)   Collection Time: 02/28/24  9:31 PM   Specimen: BLOOD LEFT ARM  Result Value Ref Range Status   Specimen Description BLOOD LEFT ARM  Final   Special Requests   Final    BOTTLES DRAWN AEROBIC AND ANAEROBIC Blood Culture results may not be optimal due to an inadequate volume of blood received in culture bottles   Culture   Final    NO GROWTH 2 DAYS Performed at [facility], [address], [place], [place] [address]    Report Status PENDING  Incomplete  Culture, blood (Routine X 2) w Reflex  to ID Panel     Status: None (Preliminary result)   Collection Time: 02/28/24  9:52 PM   Specimen: BLOOD RIGHT HAND  Result Value Ref Range Status   Specimen Description BLOOD RIGHT HAND  Final   Special Requests   Final    BOTTLES DRAWN AEROBIC AND ANAEROBIC Blood Culture results may not be optimal due to an inadequate volume of blood received in culture bottles   Culture   Final    NO GROWTH 2 DAYS Performed at [facility], [address] [place], [place] [address]    Report Status PENDING  Incomplete  Respiratory (~20 pathogens) panel by PCR     Status: None   Collection Time: 02/29/24  7:39 AM   Specimen: Nasopharyngeal Swab; Respiratory  Result Value Ref Range Status   Adenovirus NOT DETECTED NOT DETECTED Final   Coronavirus 229E NOT DETECTED NOT DETECTED Final    Comment: (NOTE) The Coronavirus on the Respiratory Panel, DOES NOT test for the novel  Coronavirus  (2019 nCoV)    Coronavirus HKU1 NOT DETECTED NOT DETECTED Final   Coronavirus NL63 NOT DETECTED NOT DETECTED Final   Coronavirus OC43 NOT DETECTED NOT DETECTED Final   Metapneumovirus NOT DETECTED NOT DETECTED Final   Rhinovirus / Enterovirus NOT DETECTED NOT DETECTED Final   Influenza A NOT DETECTED NOT DETECTED Final   Influenza B NOT DETECTED NOT DETECTED Final   Parainfluenza Virus 1 NOT DETECTED NOT DETECTED Final   Parainfluenza Virus 2 NOT DETECTED NOT DETECTED Final   Parainfluenza Virus 3 NOT DETECTED NOT DETECTED Final   Parainfluenza Virus 4 NOT DETECTED NOT DETECTED Final   Respiratory Syncytial Virus NOT DETECTED NOT DETECTED Final   Bordetella pertussis NOT DETECTED NOT DETECTED Final   Bordetella Parapertussis NOT DETECTED NOT DETECTED Final   Chlamydophila pneumoniae NOT DETECTED NOT DETECTED Final   Mycoplasma pneumoniae NOT DETECTED NOT DETECTED Final    Comment: Performed at [facility], [address] [place], [place] [address]  Resp panel by RT-PCR (RSV, Flu A&B, Covid) Anterior Nasal Swab     Status: None   Collection Time: 02/29/24  5:40 PM   Specimen: Anterior Nasal Swab  Result Value Ref Range Status   SARS Coronavirus 2 by RT PCR NEGATIVE NEGATIVE Final   Influenza A by PCR NEGATIVE NEGATIVE Final   Influenza B by PCR NEGATIVE NEGATIVE Final    Comment: (NOTE) The Xpert Xpress SARS-CoV-2/FLU/RSV plus assay is intended as an aid in the diagnosis of influenza from Nasopharyngeal swab specimens and should not be used as a sole basis for treatment. Nasal washings and aspirates are unacceptable for Xpert Xpress SARS-CoV-2/FLU/RSV testing.  Fact Sheet for Patients: BloggerCourse.com  Fact Sheet for Healthcare Providers: SeriousBroker.it  This test is not yet approved or cleared by the [place]  FDA and has been authorized for detection and/or diagnosis of SARS-CoV-2 by FDA under an Emergency Use  Authorization (EUA). This EUA will remain in effect (meaning this test can be used) for the duration of the COVID-19 declaration under Section 564(b)(1) of the Act, 21 U.S.C. section 360bbb-3(b)(1), unless the authorization is terminated or revoked.     Resp Syncytial Virus by PCR NEGATIVE NEGATIVE Final    Comment: (NOTE) Fact Sheet for Patients: BloggerCourse.com  Fact Sheet for Healthcare Providers: SeriousBroker.it  This test is not yet approved or cleared by the [place]  FDA and has been authorized for detection and/or diagnosis of SARS-CoV-2 by FDA under an Emergency  Use Authorization (EUA). This EUA will remain in effect (meaning this test can be used) for the duration of the COVID-19 declaration under Section 564(b)(1) of the Act, 21 U.S.C. section 360bbb-3(b)(1), unless the authorization is terminated or revoked.  Performed at [facility], [address] [place], [place] [address]      Radiology Studies: DG Chest Port 1 View Result Date: 02/28/2024 CLINICAL DATA:  Hypoxia, respiratory distress EXAM: PORTABLE CHEST - 1 VIEW COMPARISON:  02/28/2024 FINDINGS: Perihilar vascular congestion marginally improved. No new airspace disease. Stable cardiomegaly. Aortic Atherosclerosis (ICD10-170.0). No effusion. Cervical fixation hardware partially visualized. IMPRESSION: Marginally improved perihilar vascular congestion. Electronically Signed   By: JONETTA Faes M.D.   On: 02/28/2024 20:58     Scheduled Meds:  diltiazem   180 mg Oral QHS   empagliflozin   10 mg Oral Daily   fluticasone  furoate-vilanterol  1 puff Inhalation Daily   furosemide   40 mg Oral Daily   guaiFENesin   600 mg Oral BID   nicotine   21 mg Transdermal Q2000   pravastatin   20 mg Oral QHS   rivaroxaban   20 mg Oral Q supper   sodium chloride  flush  3 mL Intravenous Q12H   spironolactone   25 mg Oral Daily   Continuous Infusions:  ceFEPime  (MAXIPIME )  IV 2 g (03/01/24 0841)   metronidazole  500 mg (03/01/24 0843)   vancomycin  Stopped (03/01/24 0017)     LOS: 9 days    Time spent:    Sigurd Pac, MD [facility]   03/01/2024, 9:33 AM

## 2024-03-01 NOTE — Progress Notes (Signed)
 Occupational Therapy Treatment Patient Details Name: Sherri Hart MRN: [id number] DOB: 1946-05-04 Today's Date: 03/01/2024   History of present illness Pt is a 78 y/o F presenting to ED on 6/14 with SOB and BLE edema on 2L O2 chronically. Admitted for acute on chronic diastolic CHF. PMH includes COPD, CHF, A fib on Xarelto    OT comments  Pt progressing toward goals this session, needing min A for UB ADL, max A for LB ADL, able to stand at sink for grooming task with set up A. Pt completing short distance hallway ambulation with unreliable pleth on 6L O2. Pt needing x1 seated rest break on rollator, when pleth reading well, satting high 80s-low 90s on 6L O2. Cues for PLB throughout. Pt presenting with impairments listed below, will follow acutely. Continue to recommend HHOT at d/c.       If plan is discharge home, recommend the following:  A lot of help with walking and/or transfers;A little help with bathing/dressing/bathroom;Assistance with cooking/housework;Direct supervision/assist for financial management;Direct supervision/assist for medications management;Help with stairs or ramp for entrance;Assist for transportation   Equipment Recommendations  None recommended by OT    Recommendations for Other Services PT consult    Precautions / Restrictions Precautions Precautions: Fall Recall of Precautions/Restrictions: Intact Precaution/Restrictions Comments: watch SpO2 Restrictions Weight Bearing Restrictions Per Provider Order: No       Mobility Bed Mobility Overal bed mobility: Modified Independent Bed Mobility: Supine to Sit     Supine to sit: Contact guard          Transfers Overall transfer level: Needs assistance Equipment used: Rolling walker (2 wheels) Transfers: Sit to/from Stand Sit to Stand: Contact guard assist                 Balance Overall balance assessment: Needs assistance Sitting-balance support: Feet supported Sitting balance-Leahy  Scale: Good     Standing balance support: No upper extremity supported Standing balance-Leahy Scale: Good                             ADL either performed or assessed with clinical judgement   ADL Overall ADL's : Needs assistance/impaired     Grooming: Wash/dry face;Set up;Standing           Upper Body Dressing : Minimal assistance;Sitting   Lower Body Dressing: Maximal assistance;Sitting/lateral leans;Sit to/from stand Lower Body Dressing Details (indicate cue type and reason): to donn socks             Functional mobility during ADLs: Contact guard assist;Rollator (4 wheels)      Extremity/Trunk Assessment Upper Extremity Assessment Upper Extremity Assessment: Generalized weakness   Lower Extremity Assessment Lower Extremity Assessment: Defer to PT evaluation        Vision   Vision Assessment?: No apparent visual deficits   Perception Perception Perception: Not tested   Praxis Praxis Praxis: Not tested   Communication Communication Communication: No apparent difficulties   Cognition Arousal: Alert Behavior During Therapy: WFL for tasks assessed/performed Cognition: No apparent impairments                               Following commands: Intact        Cueing   Cueing Techniques: Verbal cues  Exercises      Shoulder Instructions       General Comments satting high 80s-low 90s on 6L O2  Pertinent Vitals/ Pain       Pain Assessment Pain Assessment: Faces Pain Score: 4  Faces Pain Scale: Hurts little more Pain Location: ankles/feet Pain Descriptors / Indicators: Discomfort, Aching Pain Intervention(s): Limited activity within patient's tolerance, Monitored during session, Repositioned  Home Living                                          Prior Functioning/Environment              Frequency  Min 1X/week        Progress Toward Goals  OT Goals(current goals can now be found in  the care plan section)  Progress towards OT goals: Progressing toward goals  Acute Rehab OT Goals Patient Stated Goal: none stated OT Goal Formulation: With patient Time For Goal Achievement: 03/08/24 Potential to Achieve Goals: Good ADL Goals Pt Will Perform Upper Body Dressing: with modified independence;sitting Pt Will Perform Lower Body Dressing: with modified independence;sitting/lateral leans;sit to/from stand Pt Will Transfer to Toilet: with modified independence;ambulating;regular height toilet Pt Will Perform Tub/Shower Transfer: Tub transfer;Shower transfer;with modified independence;ambulating;shower seat Additional ADL Goal #1: pt will verbalize x3 energy conservation strategies in prep for ADLs  Plan      Co-evaluation                 AM-PAC OT 6 Clicks Daily Activity     Outcome Measure   Help from another person eating meals?: A Little Help from another person taking care of personal grooming?: A Little Help from another person toileting, which includes using toliet, bedpan, or urinal?: A Little Help from another person bathing (including washing, rinsing, drying)?: A Lot Help from another person to put on and taking off regular upper body clothing?: A Little Help from another person to put on and taking off regular lower body clothing?: A Lot 6 Click Score: 16    End of Session Equipment Utilized During Treatment: Rollator (4 wheels);Oxygen  OT Visit Diagnosis: Other abnormalities of gait and mobility (R26.89);Unsteadiness on feet (R26.81);Muscle weakness (generalized) (M62.81)   Activity Tolerance Patient tolerated treatment well   Patient Left with call bell/phone within reach;in bed;with bed alarm set (seated EOB)   Nurse Communication Mobility status (O2 sats, needs purewick)        Time: 8447-8379 OT Time Calculation (min): 28 min  Charges: OT General Charges $OT Visit: 1 Visit OT Treatments $Self Care/Home Management : 8-22  mins $Therapeutic Activity: 8-22 mins  Matas Burrows K, OTD, OTR/L SecureChat Preferred Acute Rehab [phone]   Laneta POUR Koonce 03/01/2024, 4:32 PM

## 2024-03-01 NOTE — Plan of Care (Signed)
   Problem: Education: Goal: Knowledge of General Education information will improve Description: Including pain rating scale, medication(s)/side effects and non-pharmacologic comfort measures Outcome: Progressing   Problem: Nutrition: Goal: Adequate nutrition will be maintained Outcome: Progressing   Problem: Elimination: Goal: Will not experience complications related to bowel motility Outcome: Progressing

## 2024-03-01 NOTE — Progress Notes (Signed)
 Mobility Specialist Progress Note;   03/01/24 0926  Mobility  Activity Stood at bedside;Repositioned in chair (chair)  Level of Assistance Contact guard assist, steadying assist  Assistive Device Front wheel walker  Distance Ambulated (ft) 4 ft  Activity Response Tolerated well  Mobility Referral Yes  Mobility visit 1 Mobility  Mobility Specialist Start Time (ACUTE ONLY) [id number]  Mobility Specialist Stop Time (ACUTE ONLY) 0932  Mobility Specialist Time Calculation (min) (ACUTE ONLY) 6 min   Pt already in chair upon arrival, agreeable to some mobility. Required MinG assistance to perform 5x STS, static marches, and few steps forward and backwards. Pt deferred hallway ambulation, although encouraged, d/t pain. VSS on 6LO2 when able to receive accurate pleth reading. Pt left in chair with all needs met, call bell in reach.  Lauraine Erm Mobility Specialist Please contact via SecureChat or [employer] [phone]

## 2024-03-01 NOTE — Progress Notes (Unsigned)
 Cardiology Office Note   Date:  03/10/2024  ID:  Sherri Hart, DOB 1946-05-12, MRN [id number] PCP: Maree Leni Edyth DELENA, MD  [facility] Providers Cardiologist:  Vinie JAYSON Maxcy, MD     History of Present Illness Sherri Hart is a 78 y.o. female with a past medical history of atrial fibrillation, COPD, chronic diastolic heart failure. Patient has been lost to cardiology follow up since 2022. Presents today to reestablish care   Patient previously had echocardiogram in 06/2020 that showed EF 55-60%, mild RV dysfunction, severe biatrial enlargement. He was admitted in 01/2021 with shortness of breat, dyspnea on exertion. In the ED, she was found to be in afib with RVR in the setting of COPD exacerbation. She was discharged on diltiazem  180 mg daily. Later admitted in 08/2021 with COPD exacerbation. Echocardiogram that admission showed EF 60-65%, no regional wall motion abnormalities, mild LVH, moderately elevated PA systolic pressure, severe biatrial dilation, mild MR. He has not been seen by cardiology since 2022  Most recent echocardiogram from 11/2023 showed EF 60-65%, no regional wall motion abnormalities, mild LVH, mildly reduced RV systolic function, moderately elevated PA systolic pressure, severe biatrial enlargement.   He was admitted 6/14-  24 with acute respiratory failure. Presented to the ED 6/14 complaining of increased shortness of breath, lower extremity swelling. BNP elevated to 210. CXR showed cardiomegaly with vascular congestion and mild interstitial opacity. She was treated with IV lasix , transitioned to PO lasix  prior to DC. Remained on jardiance , aldactone . On 6/22, patient developed a fever. COVID, flu, RSV negative. UA unremarkable. Treated with broad-spectrum antibiotics.   Today, patient presents for hospital follow-up and to reestablish care with cardiology.  Reports that she presented to the hospital due to worsening lower extremity swelling.  The  swelling was very painful and caused her to have some weeping blisters on her legs.  She also reported some worsening shortness of breath.   she had been taking oral Lasix  but did not notice improvement in her swelling.  In the hospital she was given IV Lasix  with improvement.  She has been feeling well since being discharged from the hospital.  She has COPD and has home oxygen that she uses as needed.  Reports that her breathing has been stable since discharge and she denies any worsening shortness of breath.  Denies increased use of home oxygen.  She has noticed recurrence of some lower extremity swelling.  Denies orthopnea but she does not lay flat due to back pain.  Denies abdominal distention or early satiety.  Denies dizziness, syncope, near syncope.  Denies chest pain.  Denies palpitations and reports that HR has been well controlled. No bleeding issues with xarelto    Studies Reviewed  Cardiac Studies & Procedures   ______________________________________________________________________________________________     ECHOCARDIOGRAM  ECHOCARDIOGRAM COMPLETE 11/21/2023  Narrative ECHOCARDIOGRAM REPORT    Patient Name:   Sherri Hart BLOCK Date of Exam: 11/21/2023 Medical Rec #:  [id number]          Height:       66.0 in Accession #:    [id number]         Weight:       268.7 lb Date of Birth:  07/09/1946           BSA:          2.267 m Patient Age:    77 years           BP:  136/72 mmHg Patient Gender: F                  HR:           95 bpm. Exam Location:  Inpatient  Procedure: 2D Echo, Cardiac Doppler and Color Doppler (Both Spectral and Color Flow Doppler were utilized during procedure).  Indications:    Dyspnea  History:        Patient has prior history of Echocardiogram examinations, most recent 11/29/2022. CHF, COPD, Arrythmias:Atrial Fibrillation; Risk Factors:Current Smoker.  Sonographer:    Sherri Hart Referring Phys: [id number] Sherri A  Hart  IMPRESSIONS   1. Left ventricular ejection fraction, by estimation, is 60 to 65%. The left ventricle has normal function. The left ventricle has no regional wall motion abnormalities. There is mild concentric left ventricular hypertrophy. Left ventricular diastolic parameters are indeterminate. 2. Right ventricular systolic function is mildly reduced. The right ventricular size is mildly enlarged. There is moderately elevated pulmonary artery systolic pressure. The estimated right ventricular systolic pressure is 47.3 mmHg. 3. Left atrial size was severely dilated. 4. Right atrial size was severely dilated. 5. The mitral valve is normal in structure. Trivial mitral valve regurgitation. No evidence of mitral stenosis. 6. The aortic valve is tricuspid. There is mild calcification of the aortic valve. Aortic valve regurgitation is not visualized. Aortic valve sclerosis/calcification is present, without any evidence of aortic stenosis. 7. The inferior vena cava is dilated in size with <50% respiratory variability, suggesting right atrial pressure of 15 mmHg.  FINDINGS Left Ventricle: Left ventricular ejection fraction, by estimation, is 60 to 65%. The left ventricle has normal function. The left ventricle has no regional wall motion abnormalities. The left ventricular internal cavity size was normal in size. There is mild concentric left ventricular hypertrophy. Left ventricular diastolic parameters are indeterminate.  Right Ventricle: The right ventricular size is mildly enlarged. No increase in right ventricular wall thickness. Right ventricular systolic function is mildly reduced. There is moderately elevated pulmonary artery systolic pressure. The tricuspid regurgitant velocity is 2.84 m/s, and with an assumed right atrial pressure of 15 mmHg, the estimated right ventricular systolic pressure is 47.3 mmHg.  Left Atrium: Left atrial size was severely dilated.  Right Atrium: Right  atrial size was severely dilated.  Pericardium: There is no evidence of pericardial effusion.  Mitral Valve: The mitral valve is normal in structure. Trivial mitral valve regurgitation. No evidence of mitral valve stenosis. MV peak gradient, 5.5 mmHg. The mean mitral valve gradient is 3.0 mmHg.  Tricuspid Valve: The tricuspid valve is normal in structure. Tricuspid valve regurgitation is mild . No evidence of tricuspid stenosis.  Aortic Valve: The aortic valve is tricuspid. There is mild calcification of the aortic valve. Aortic valve regurgitation is not visualized. Aortic valve sclerosis/calcification is present, without any evidence of aortic stenosis. Aortic valve mean gradient measures 2.0 mmHg. Aortic valve peak gradient measures 4.1 mmHg. Aortic valve area, by VTI measures 2.91 cm.  Pulmonic Valve: The pulmonic valve was normal in structure. Pulmonic valve regurgitation is trivial. No evidence of pulmonic stenosis.  Aorta: The aortic root is normal in size and structure.  Venous: The inferior vena cava is dilated in size with less than 50% respiratory variability, suggesting right atrial pressure of 15 mmHg.  IAS/Shunts: No atrial level shunt detected by color flow Doppler.   LEFT VENTRICLE PLAX 2D LVIDd:         4.90 cm LVIDs:         3.70  cm LV PW:         1.20 cm LV IVS:        1.40 cm LVOT diam:     2.20 cm LV SV:         48 LV SV Index:   21 LVOT Area:     3.80 cm   RIGHT VENTRICLE          IVC RV Basal diam:  4.00 cm  IVC diam: 2.30 cm TAPSE (M-mode): 0.7 cm  LEFT ATRIUM            Index        RIGHT ATRIUM           Index LA Vol (A2C): 71.7 ml  31.62 ml/m  RA Area:     33.40 cm LA Vol (A4C): 126.0 ml 55.57 ml/m  RA Volume:   117.00 ml 51.60 ml/m AORTIC VALVE                    PULMONIC VALVE AV Area (Vmax):    3.14 cm     PV Vmax:       0.80 m/s AV Area (Vmean):   2.61 cm     PV Peak grad:  2.6 mmHg AV Area (VTI):     2.91 cm AV Vmax:           101.00  cm/s AV Vmean:          73.000 cm/s AV VTI:            0.166 m AV Peak Grad:      4.1 mmHg AV Mean Grad:      2.0 mmHg LVOT Vmax:         83.30 cm/s LVOT Vmean:        50.200 cm/s LVOT VTI:          0.127 m LVOT/AV VTI ratio: 0.77  AORTA Ao Root diam: 2.90 cm Ao Asc diam:  3.50 cm  MITRAL VALVE              TRICUSPID VALVE MV Area (PHT): 3.93 cm   TR Peak grad:   32.3 mmHg MV Area VTI:   2.87 cm   TR Vmax:        284.00 cm/s MV Peak grad:  5.5 mmHg MV Mean grad:  3.0 mmHg   SHUNTS MV Vmax:       1.17 m/s   Systemic VTI:  0.13 m MV Vmean:      79.4 cm/s  Systemic Diam: 2.20 cm  Toribio Fuel MD Electronically signed by Toribio Fuel MD Signature Date/Time: 11/21/2023/3:15:56 PM    Final          ______________________________________________________________________________________________      Risk Assessment/Calculations  CHA2DS2-VASc Score = 5  This indicates a 7.2% annual risk of stroke. The patient's score is based upon: CHF History: 1 HTN History: 1 Diabetes History: 0 Stroke History: 0 Vascular Disease History: 0 Age Score: 2 Gender Score: 1           Physical Exam VS:  BP 126/78   Pulse 91   Ht 5' 8 (1.727 m)   Wt 254 lb (115.2 kg)   SpO2 96%   BMI 38.62 kg/m        Wt Readings from Last 3 Encounters:  03/10/24 254 lb (115.2 kg)  03/02/24 257 lb 4.8 oz (116.7 kg)  11/20/23 268 lb 11.9 oz (121.9 kg)    GEN: Well nourished,  well developed in no acute distress. Sitting comfortably in a wheelchair  NECK: No JVD  CARDIAC: Irregular rate and rhythm. no murmurs, rubs, gallops RESPIRATORY: Distant but clear breath sounds. No crackles, rhonchi, wheezing noted. Normal WOB on [place]  ABDOMEN: Soft, non-tender, non-distended EXTREMITIES: 1+ edema in BLE; No deformity   ASSESSMENT AND PLAN  Chronic diastolic heart failure  Pulm HTN  - Most recent echocardiogram from 11/2023 showed EF 60-65%, no regional wall motion abnormalities, mild LVH,  mildly reduced RV systolic function, moderately elevated PA systolic pressure, severe biatrial enlargement - Patient recently admitted 6/14-24 with respiratory failure and lower extremity swelling. Treated with IV lasix  while admitted. Discharged on lasix , jardiance , spiro  - Patient reports that her breathing has been stable since DC. She has COPD and has oxygen to use at home as needed. Denies needing more supplemental oxygen than usual. No DOE or abdominal distention. She has been developing some lower extremity swelling again  - Ordered BMP, BNP today  - Increase lasix  to 40 mg BID for the next 3 days. Then, go back down to lasix  40 mg daily.  - Ordered repeat BMP to be collected next week after increased lasix   - Continue Jardiance  10 mg daily, spironolactone  25 mg daily  Permanent atrial fibrillation  -  Echo with severe biatrial enlargement  - Patient denies palpitations or tachycardia at home. HR well controlled in clinic today  - Continue diltiazem  180 mg daily - Continue Xarelto  20 mg daily- denies bleeding on xarelto    COPD - Followed by PCP  - As above, patient has supplemental oxygen to use at home as needed. Breathing has been stable since DC   HTN  - BP well controlled. No dizziness, syncope, near syncope  - Continue diltiazem  180 mg daily, lasix , spironolactone  25 mg daily  - BMP as above   Hyperlipidemia - Continue pravastatin  20 mg daily   CKD Stage IIIb - Creatinine 1.51 on 6/24 - Ordered BMP today, repeat in 1 week as above    Dispo: Follow up with APP in 8 weeks   Signed, Rollo FABIENE Louder, PA-C

## 2024-03-02 ENCOUNTER — Other Ambulatory Visit (HOSPITAL_COMMUNITY): Payer: Self-pay

## 2024-03-02 DIAGNOSIS — I5033 Acute on chronic diastolic (congestive) heart failure: Secondary | ICD-10-CM | POA: Diagnosis not present

## 2024-03-02 LAB — CBC
HCT: 46.2 % — ABNORMAL HIGH (ref 36.0–46.0)
Hemoglobin: 14.6 g/dL (ref 12.0–15.0)
MCH: 32.2 pg (ref 26.0–34.0)
MCHC: 31.6 g/dL (ref 30.0–36.0)
MCV: 102 fL — ABNORMAL HIGH (ref 80.0–100.0)
Platelets: 293 10*3/uL (ref 150–400)
RBC: 4.53 MIL/uL (ref 3.87–5.11)
RDW: 14.5 % (ref 11.5–15.5)
WBC: 19 10*3/uL — ABNORMAL HIGH (ref 4.0–10.5)
nRBC: 0 % (ref 0.0–0.2)

## 2024-03-02 LAB — BASIC METABOLIC PANEL WITH GFR
Anion gap: 7 (ref 5–15)
BUN: 51 mg/dL — ABNORMAL HIGH (ref 8–23)
CO2: 33 mmol/L — ABNORMAL HIGH (ref 22–32)
Calcium: 8.9 mg/dL (ref 8.9–10.3)
Chloride: 93 mmol/L — ABNORMAL LOW (ref 98–111)
Creatinine, Ser: 1.51 mg/dL — ABNORMAL HIGH (ref 0.44–1.00)
GFR, Estimated: 36 mL/min — ABNORMAL LOW (ref >60)
Glucose, Bld: 110 mg/dL — ABNORMAL HIGH (ref 70–99)
Potassium: 5 mmol/L (ref 3.5–5.1)
Sodium: 133 mmol/L — ABNORMAL LOW (ref 135–145)

## 2024-03-02 MED ORDER — SPIRONOLACTONE 25 MG PO TABS
25.0000 mg | ORAL_TABLET | Freq: Every day | ORAL | 1 refills | Status: AC
Start: 2024-03-03 — End: ?
  Filled 2024-03-02: qty 30, 30d supply, fill #0

## 2024-03-02 MED ORDER — HYDROCODONE-ACETAMINOPHEN 5-325 MG PO TABS
1.0000 | ORAL_TABLET | Freq: Four times a day (QID) | ORAL | 0 refills | Status: AC | PRN
  Filled 2024-03-02: qty 20, 5d supply, fill #0

## 2024-03-02 MED ORDER — EMPAGLIFLOZIN 10 MG PO TABS
10.0000 mg | ORAL_TABLET | Freq: Every day | ORAL | 1 refills | Status: AC
Start: 2024-03-03 — End: ?
  Filled 2024-03-02: qty 30, 30d supply, fill #0

## 2024-03-02 MED ORDER — SPIRONOLACTONE 25 MG PO TABS: 25.0000 mg | ORAL_TABLET | Freq: Every day | ORAL | Status: DC

## 2024-03-02 MED ORDER — CEFUROXIME AXETIL 250 MG PO TABS: 250.0000 mg | ORAL_TABLET | Freq: Two times a day (BID) | ORAL | Status: DC

## 2024-03-02 MED ORDER — CEFUROXIME AXETIL 250 MG PO TABS
250.0000 mg | ORAL_TABLET | Freq: Two times a day (BID) | ORAL | 0 refills | Status: AC
  Filled 2024-03-02: qty 8, 4d supply, fill #0

## 2024-03-02 NOTE — Progress Notes (Signed)
 Discharge lounge called for patient,  they stated since she is on 5L of oxygen they would have to check if she could come down  due to them not having a nurse down there at the moment and will call me back.

## 2024-03-02 NOTE — Plan of Care (Signed)

## 2024-03-02 NOTE — Discharge Summary (Signed)
 Physician Discharge Summary  Sherri Hart FMW:[id number] DOB: October 01, 1945 DOA: 02/21/2024  PCP: Maree Leni Edyth DELENA, MD  Admit date: 02/21/2024 Discharge date: 03/02/2024  Time spent: 45 minutes  Recommendations for Outpatient Follow-up:  Follow-up with [facility] heart care on 7/2 Pulmonary follow-up in 1 month   Discharge Diagnoses:  Principal Problem:   Acute on chronic diastolic CHF (congestive heart failure) (HCC) COPD Chronic hypoxic respiratory failure   Paroxysmal atrial fibrillation (HCC)   AKI (acute kidney injury) (HCC)   COPD with acute exacerbation (HCC)   Dyslipidemia   Nicotine  dependence, cigarettes, uncomplicated   Obesity, class 3 Chronic pain  Discharge Condition: Improved  Diet recommendation: Low-sodium, heart healthy  Filed Weights   02/28/24 0500 03/01/24 0551 03/02/24 0442  Weight: 115.9 kg 115.2 kg 116.7 kg    History of present illness:  77/F w atrial fibrillation, COPD, diastolic CHF, and obesity who presented with dyspnea, wheezing and lower extremity edema for the last 3 days. EMS was called, > respiratory distress,  In ED, 02 saturation 86%. diffuse bilateral wheezing, and positive lower extremity edema. CXR w bilateral hilar vascular congestion, with bilateral central interstitial infiltrates. 6/16 responding well to diuresis. - 6/18-6/21 with persistent hypoxia - 6/22 overnight: Fever of 102 with worsening respiratory distress, hypoxia and encephalopathy  Hospital Course:   Acute on chronic diastolic CHF ) -Echo with EF 60 to 65%, , RV mild reduction, RA with severe dilatation,  - Improved with diuresis, 12 L negative - Transition to oral Lasix , continue Jardiance  and Aldactone  - Only able to wean O2 down to 5 L,  - Follow-up with [facility] heart care on 7/2   Fever, SIRS Bronchitis -  temp of 102 on 6/22 AM with respiratory distress - Suspect a component of bronchitis, started broad-spectrum antibiotics,  x-ray unremarkable, no other  localizing symptoms - Part of elevated WBC felt to be from recent steroids, blood cultures negative so far, flu COVID and respiratory virus panel were negative, UA unremarkable - Afebrile and improved now, transition to oral cefuroxime, continue for 4 more days   Acute hypoxemic respiratory failure, on chronic hypoxemia,  - Due to underlying advanced COPD and atelectasis, CHF - Pulm symptoms have significantly improved however has moderate persistent hypoxia, only able to wean O2 down to 5 L, on 2 to 3 L at baseline previously  - Continue COPD meds   Left ankle pain, swelling - Suspect gout flare, uric acid elevated - sp Prednisone  X2 days, improving   Permanent atrial fibrillation (HCC) Continue diltiazem  and rivaroxaban .    COPD with chronic respiratory failure Will continue bronchodilator therapy, ICS and LABA.    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    Nicotine  dependence, cigarettes, uncomplicated Smoking cessation counseling.     Discharge Exam: Vitals:   03/02/24 0442 03/02/24 0718  BP: (!) 107/95 110/65  Pulse: 99 76  Resp: 17 13  Temp: 97.9 F (36.6 C) 98.4 F (36.9 C)  SpO2: 90% 92%  Gen: Awake, Alert, Oriented X 3,  HEENT: no JVD Lungs: Good air movement bilaterally, CTAB CVS: S1S2/RRR Abd: soft, Non tender, non distended, BS present Extremities: No edema Skin: no new rashes on exposed skin   Discharge Instructions   Discharge Instructions     Diet - low sodium heart healthy   Complete by: As directed    Increase activity slowly   Complete by: As directed       Allergies  as of 03/02/2024       Reactions   Aspirin Other (See Comments)   Stomach ache    Penicillins Swelling, Other (See Comments)   Swollen lips and welts + Pt stated that she can take keflex ++ Did it involve swelling of the face/tongue/throat, SOB, or low BP? Yes Did it involve sudden or severe rash/hives, skin peeling, or any reaction on the  inside of your mouth or nose? NO Did you need to seek medical attention at a hospital or doctor's office? Y When did it last happen? childhood        Medication List     STOP taking these medications    Anoro Ellipta  62.5-25 MCG/ACT Aepb Generic drug: umeclidinium-vilanterol   SINUS PO       TAKE these medications    albuterol  108 (90 Base) MCG/ACT inhaler Commonly known as: VENTOLIN  HFA Inhale 2 puffs into the lungs every 6 (six) hours as needed for wheezing or shortness of breath.   alendronate 70 MG tablet Commonly known as: FOSAMAX Take 70 mg by mouth every Friday. Take with a full glass of water  on an empty stomach.   cefUROXime 250 MG tablet Commonly known as: CEFTIN Take 1 tablet (250 mg total) by mouth 2 (two) times daily with a meal for 4 days.   Dilt-XR 180 MG 24 hr capsule Generic drug: diltiazem  Take 180 mg by mouth at bedtime.   empagliflozin  10 MG Tabs tablet Commonly known as: JARDIANCE  Take 1 tablet (10 mg total) by mouth daily. Start taking on: March 03, 2024   furosemide  40 MG tablet Commonly known as: LASIX  Take 1 tablet (40 mg total) by mouth daily.   guaiFENesin  600 MG 12 hr tablet Commonly known as: MUCINEX  Take 1 tablet (600 mg total) by mouth 2 (two) times daily.   HYDROcodone -acetaminophen  5-325 MG tablet Commonly known as: NORCO/VICODIN Take 1 tablet by mouth every 6 (six) hours as needed for moderate pain (pain score 4-6) or severe pain (pain score 7-10).   ipratropium-albuterol  0.5-2.5 (3) MG/3ML Soln Commonly known as: DUONEB Take 3 mLs by nebulization every 6 (six) hours as needed.   loratadine  10 MG tablet Commonly known as: CLARITIN  Take 10 mg by mouth daily as needed for allergies or rhinitis.   nicotine  21 mg/24hr patch Commonly known as: NICODERM CQ  - dosed in mg/24 hours Place 1 patch (21 mg total) onto the skin daily.   OXYGEN Inhale 2 L/min into the lungs continuous.   pravastatin  20 MG tablet Commonly known  as: PRAVACHOL  Take 20 mg by mouth at bedtime.   spironolactone  25 MG tablet Commonly known as: ALDACTONE  Take 1 tablet (25 mg total) by mouth daily. Start taking on: March 03, 2024   Symbicort 80-4.5 MCG/ACT inhaler Generic drug: budesonide -formoterol Inhale 2 puffs into the lungs in the morning and at bedtime.   Tylenol  325 MG tablet Generic drug: acetaminophen  Take 325-650 mg by mouth every 6 (six) hours as needed for mild pain (pain score 1-3) or headache.   Xarelto  20 MG Tabs tablet Generic drug: rivaroxaban  TAKE 1 TABLET(20 MG) BY MOUTH DAILY WITH SUPPER       Allergies  Allergen Reactions   Aspirin Other (See Comments)    Stomach ache    Penicillins Swelling and Other (See Comments)    Swollen lips and welts + Pt stated that she can take keflex ++ Did it involve swelling of the face/tongue/throat, SOB, or low BP? Yes Did it involve sudden or severe rash/hives,  skin peeling, or any reaction on the inside of your mouth or nose? NO Did you need to seek medical attention at a hospital or doctor's office? Y When did it last happen? childhood      The results of significant diagnostics from this hospitalization (including imaging, microbiology, ancillary and laboratory) are listed below for reference.    Significant Diagnostic Studies: DG Chest Port 1 View Result Date: 02/28/2024 CLINICAL DATA:  Hypoxia, respiratory distress EXAM: PORTABLE CHEST - 1 VIEW COMPARISON:  02/28/2024 FINDINGS: Perihilar vascular congestion marginally improved. No new airspace disease. Stable cardiomegaly. Aortic Atherosclerosis (ICD10-170.0). No effusion. Cervical fixation hardware partially visualized. IMPRESSION: Marginally improved perihilar vascular congestion. Electronically Signed   By: JONETTA Faes M.D.   On: 02/28/2024 20:58   DG CHEST PORT 1 VIEW Result Date: 02/28/2024 CLINICAL DATA:  Hypoxia EXAM: PORTABLE CHEST 1 VIEW COMPARISON:  Chest radiograph dated 02/21/2024 FINDINGS: Normal lung  volumes. Similar diffuse interstitial opacities. Increased left mid and lower lung linear opacities. Bibasilar patchy opacities. No pleural effusion or pneumothorax. Similar enlarged cardiomediastinal silhouette. Cervical spinal fixation hardware appears intact. IMPRESSION: 1. Similar diffuse interstitial opacities, which may represent pulmonary edema. 2. Increased left mid and lower lung linear and bibasilar patchy opacities, likely atelectasis. Electronically Signed   By: Limin  Xu M.D.   On: 02/28/2024 08:07   DG Chest Portable 1 View Result Date: 02/21/2024 CLINICAL DATA:  Shortness of breath EXAM: PORTABLE CHEST 1 VIEW COMPARISON:  11/21/2023 FINDINGS: Hardware in the cervical spine. Cardiomegaly with vascular congestion and mild interstitial opacity probably due to mild edema. Possible trace left effusion. Subsegmental atelectasis at the left base. No consolidation or pneumothorax. IMPRESSION: Cardiomegaly with vascular congestion and mild interstitial opacity probably due to mild edema. Possible trace left effusion. Electronically Signed   By: Luke Bun M.D.   On: 02/21/2024 15:43    Microbiology: Recent Results (from the past 240 hours)  MRSA Next Gen by PCR, Nasal     Status: None   Collection Time: 02/21/24  8:16 PM   Specimen: Nasal Mucosa; Nasal Swab  Result Value Ref Range Status   MRSA by PCR Next Gen NOT DETECTED NOT DETECTED Final    Comment: (NOTE) The GeneXpert MRSA Assay (FDA approved for NASAL specimens only), is one component of a comprehensive MRSA colonization surveillance program. It is not intended to diagnose MRSA infection nor to guide or monitor treatment for MRSA infections. Test performance is not FDA approved in patients less than 44 years old. Performed at [facility], [address] [place], [place] [address]   Culture, blood (Routine X 2) w Reflex to ID Panel     Status: None (Preliminary result)   Collection Time: 02/28/24  9:31 PM   Specimen:  BLOOD LEFT ARM  Result Value Ref Range Status   Specimen Description BLOOD LEFT ARM  Final   Special Requests   Final    BOTTLES DRAWN AEROBIC AND ANAEROBIC Blood Culture results may not be optimal due to an inadequate volume of blood received in culture bottles   Culture   Final    NO GROWTH 3 DAYS Performed at [facility], [address], [place], [place] [address]    Report Status PENDING  Incomplete  Culture, blood (Routine X 2) w Reflex to ID Panel     Status: None (Preliminary result)   Collection Time: 02/28/24  9:52 PM   Specimen: BLOOD RIGHT HAND  Result Value Ref Range Status  Specimen Description BLOOD RIGHT HAND  Final   Special Requests   Final    BOTTLES DRAWN AEROBIC AND ANAEROBIC Blood Culture results may not be optimal due to an inadequate volume of blood received in culture bottles   Culture   Final    NO GROWTH 3 DAYS Performed at [facility], [address], [place], [place] [address]    Report Status PENDING  Incomplete  Respiratory (~20 pathogens) panel by PCR     Status: None   Collection Time: 02/29/24  7:39 AM   Specimen: Nasopharyngeal Swab; Respiratory  Result Value Ref Range Status   Adenovirus NOT DETECTED NOT DETECTED Final   Coronavirus 229E NOT DETECTED NOT DETECTED Final    Comment: (NOTE) The Coronavirus on the Respiratory Panel, DOES NOT test for the novel  Coronavirus (2019 nCoV)    Coronavirus HKU1 NOT DETECTED NOT DETECTED Final   Coronavirus NL63 NOT DETECTED NOT DETECTED Final   Coronavirus OC43 NOT DETECTED NOT DETECTED Final   Metapneumovirus NOT DETECTED NOT DETECTED Final   Rhinovirus / Enterovirus NOT DETECTED NOT DETECTED Final   Influenza A NOT DETECTED NOT DETECTED Final   Influenza B NOT DETECTED NOT DETECTED Final   Parainfluenza Virus 1 NOT DETECTED NOT DETECTED Final   Parainfluenza Virus 2 NOT DETECTED NOT DETECTED Final   Parainfluenza Virus 3 NOT DETECTED NOT DETECTED Final   Parainfluenza Virus 4 NOT  DETECTED NOT DETECTED Final   Respiratory Syncytial Virus NOT DETECTED NOT DETECTED Final   Bordetella pertussis NOT DETECTED NOT DETECTED Final   Bordetella Parapertussis NOT DETECTED NOT DETECTED Final   Chlamydophila pneumoniae NOT DETECTED NOT DETECTED Final   Mycoplasma pneumoniae NOT DETECTED NOT DETECTED Final    Comment: Performed at [facility], [address] [place], [place] [address]  Resp panel by RT-PCR (RSV, Flu A&B, Covid) Anterior Nasal Swab     Status: None   Collection Time: 02/29/24  5:40 PM   Specimen: Anterior Nasal Swab  Result Value Ref Range Status   SARS Coronavirus 2 by RT PCR NEGATIVE NEGATIVE Final   Influenza A by PCR NEGATIVE NEGATIVE Final   Influenza B by PCR NEGATIVE NEGATIVE Final    Comment: (NOTE) The Xpert Xpress SARS-CoV-2/FLU/RSV plus assay is intended as an aid in the diagnosis of influenza from Nasopharyngeal swab specimens and should not be used as a sole basis for treatment. Nasal washings and aspirates are unacceptable for Xpert Xpress SARS-CoV-2/FLU/RSV testing.  Fact Sheet for Patients: BloggerCourse.com  Fact Sheet for Healthcare Providers: SeriousBroker.it  This test is not yet approved or cleared by the [place]  FDA and has been authorized for detection and/or diagnosis of SARS-CoV-2 by FDA under an Emergency Use Authorization (EUA). This EUA will remain in effect (meaning this test can be used) for the duration of the COVID-19 declaration under Section 564(b)(1) of the Act, 21 U.S.C. section 360bbb-3(b)(1), unless the authorization is terminated or revoked.     Resp Syncytial Virus by PCR NEGATIVE NEGATIVE Final    Comment: (NOTE) Fact Sheet for Patients: BloggerCourse.com  Fact Sheet for Healthcare Providers: SeriousBroker.it  This test is not yet approved or cleared by the [place]  FDA and has been  authorized for detection and/or diagnosis of SARS-CoV-2 by FDA under an Emergency Use Authorization (EUA). This EUA will remain in effect (meaning this test can be used) for the duration of the COVID-19 declaration under Section 564(b)(1) of the Act, 21 U.S.C. section 360bbb-3(b)(1), unless  the authorization is terminated or revoked.  Performed at [facility], [address], [place], [place] [address]      Labs: Basic Metabolic Panel: Recent Labs  Lab 02/25/24 0244 02/26/24 0350 02/27/24 0229 02/28/24 0221 02/29/24 0217 03/01/24 0217 03/02/24 0235  NA 138   < > 135 134* 134* 134* 133*  K 3.9   < > 4.1 3.6 3.7 3.7 5.0  CL 92*   < > 87* 87* 91* 93* 93*  CO2 35*   < > 38* 29 29 28  33*  GLUCOSE 106*   < > 137* 169* 240* 218* 110*  BUN 39*   < > 50* 47* 48* 54* 51*  CREATININE 1.24*   < > 1.60* 1.34* 1.53* 1.42* 1.51*  CALCIUM 8.9   < > 9.1 9.4 8.5* 8.5* 8.9  MG 2.1  --   --   --   --   --   --    < > = values in this interval not displayed.   Liver Function Tests: No results for input(s): AST, ALT, ALKPHOS, BILITOT, PROT, ALBUMIN in the last 168 hours. No results for input(s): LIPASE, AMYLASE in the last 168 hours. No results for input(s): AMMONIA in the last 168 hours. CBC: Recent Labs  Lab 02/29/24 0911 03/01/24 0217 03/02/24 0235  WBC 15.0* 20.5* 19.0*  HGB 15.9* 14.3 14.6  HCT 48.7* 44.8 46.2*  MCV 99.8 100.0 102.0*  PLT 234 260 293   Cardiac Enzymes: No results for input(s): CKTOTAL, CKMB, CKMBINDEX, TROPONINI in the last 168 hours. BNP: BNP (last 3 results) Recent Labs    04/24/23 2100 11/20/23 0440 02/21/24 1530  BNP 165.6* 448.7* 210.5*    ProBNP (last 3 results) No results for input(s): PROBNP in the last 8760 hours.  CBG: Recent Labs  Lab 02/28/24 2048  GLUCAP 125*       Signed:  Sigurd Pac MD.  [facility] 03/02/2024, 9:42 AM

## 2024-03-04 LAB — CULTURE, BLOOD (ROUTINE X 2)
Culture: NO GROWTH
Culture: NO GROWTH

## 2024-03-10 ENCOUNTER — Ambulatory Visit: Attending: Cardiology | Admitting: Cardiology

## 2024-03-10 ENCOUNTER — Encounter: Payer: Self-pay | Admitting: Cardiology

## 2024-03-10 VITALS — BP 126/78 | HR 91 | Ht 68.0 in | Wt 254.0 lb

## 2024-03-10 DIAGNOSIS — E785 Hyperlipidemia, unspecified: Secondary | ICD-10-CM | POA: Diagnosis not present

## 2024-03-10 DIAGNOSIS — I272 Pulmonary hypertension, unspecified: Secondary | ICD-10-CM

## 2024-03-10 DIAGNOSIS — I4821 Permanent atrial fibrillation: Secondary | ICD-10-CM | POA: Diagnosis not present

## 2024-03-10 DIAGNOSIS — I5032 Chronic diastolic (congestive) heart failure: Secondary | ICD-10-CM | POA: Diagnosis not present

## 2024-03-10 DIAGNOSIS — J449 Chronic obstructive pulmonary disease, unspecified: Secondary | ICD-10-CM

## 2024-03-10 MED ORDER — FUROSEMIDE 40 MG PO TABS
ORAL_TABLET | ORAL | Status: AC
Start: 2024-03-10 — End: 2025-03-08

## 2024-03-10 NOTE — Patient Instructions (Addendum)
 Medication Instructions:  INCREASE Furosemide :Take 1 tablet (40 mg total) by mouth 2 (two) times daily for 3 days, THEN 1 tablet (40 mg total) daily.   *If you need a refill on your cardiac medications before your next appointment, please call your pharmacy*  Lab Work: BMP-Today BNP-Today  BMP-in 1 week  If you have labs (blood work) drawn today and your tests are completely normal, you will receive your results only by: MyChart Message (if you have MyChart) OR A paper copy in the mail If you have any lab test that is abnormal or we need to change your treatment, we will call you to review the results.   Follow-Up: At [facility], you and your health needs are our priority.  As part of our continuing mission to provide you with exceptional heart care, our providers are all part of one team.  This team includes your primary Cardiologist (physician) and Advanced Practice Providers or APPs (Physician Assistants and Nurse Practitioners) who all work together to provide you with the care you need, when you need it.  Your next appointment:   2 month(s)  Provider:   Rollo Louder, PA-C        We recommend signing up for the patient portal called MyChart.  Sign up information is provided on this After Visit Summary.  MyChart is used to connect with patients for Virtual Visits (Telemedicine).  Patients are able to view lab/test results, encounter notes, upcoming appointments, etc.  Non-urgent messages can be sent to your provider as well.   To learn more about what you can do with MyChart, go to [email or web address].

## 2024-03-11 LAB — BASIC METABOLIC PANEL WITH GFR
BUN/Creatinine Ratio: 20 (ref 12–28)
BUN: 23 mg/dL (ref 8–27)
CO2: 24 mmol/L (ref 20–29)
Calcium: 9.3 mg/dL (ref 8.7–10.3)
Chloride: 102 mmol/L (ref 96–106)
Creatinine, Ser: 1.17 mg/dL — ABNORMAL HIGH (ref 0.57–1.00)
Glucose: 93 mg/dL (ref 70–99)
Potassium: 5.1 mmol/L (ref 3.5–5.2)
Sodium: 143 mmol/L (ref 134–144)
eGFR: 48 mL/min/{1.73_m2} — ABNORMAL LOW (ref >59)

## 2024-03-11 LAB — BRAIN NATRIURETIC PEPTIDE: BNP: 249 pg/mL — ABNORMAL HIGH (ref 0.0–100.0)

## 2024-03-15 ENCOUNTER — Ambulatory Visit: Payer: Self-pay | Admitting: Cardiology

## 2024-03-16 NOTE — Telephone Encounter (Signed)
 Unable to leave message

## 2024-03-16 NOTE — Telephone Encounter (Signed)
-----   Message from Rollo FABIENE Louder sent at 03/15/2024  7:42 AM EDT ----- Please tell patient that her labs showed that her kidney function has improved since her recent hospitalization. This is good news! Her BNP was elevated, which tells us  she did have extra fluid in  her system last week. The increased dose of lasix  should have helped take care of this.   Please remind her to come back for repeat BMP this week   Thanks KJ  ----- Message ----- From: Interface, [facility] Lab Results In Sent: 03/11/2024   6:37 AM EDT To: Rollo JONELLE Louder, PA-C

## 2024-03-18 NOTE — Telephone Encounter (Signed)
 Not able to leave message

## 2024-03-24 NOTE — Telephone Encounter (Signed)
 Called patient advised of below they verbalized understanding.

## 2024-03-24 NOTE — Telephone Encounter (Signed)
-----   Message from Rollo FABIENE Louder sent at 03/15/2024  7:42 AM EDT ----- Please tell patient that her labs showed that her kidney function has improved since her recent hospitalization. This is good news! Her BNP was elevated, which tells us  she did have extra fluid in  her system last week. The increased dose of lasix  should have helped take care of this.   Please remind her to come back for repeat BMP this week   Thanks KJ  ----- Message ----- From: Interface, [facility] Lab Results In Sent: 03/11/2024   6:37 AM EDT To: Rollo JONELLE Louder, PA-C

## 2024-03-31 ENCOUNTER — Other Ambulatory Visit: Payer: Self-pay | Admitting: Family Medicine

## 2024-03-31 ENCOUNTER — Other Ambulatory Visit (HOSPITAL_BASED_OUTPATIENT_CLINIC_OR_DEPARTMENT_OTHER): Payer: Self-pay | Admitting: Family Medicine

## 2024-03-31 DIAGNOSIS — K7469 Other cirrhosis of liver: Secondary | ICD-10-CM

## 2024-03-31 DIAGNOSIS — M810 Age-related osteoporosis without current pathological fracture: Secondary | ICD-10-CM

## 2024-05-21 ENCOUNTER — Ambulatory Visit: Attending: Cardiology | Admitting: Emergency Medicine

## 2024-05-21 NOTE — Progress Notes (Deleted)
 Cardiology Office Note:    Date:  05/21/2024  ID:  Sherri Hart, DOB [birth year], MRN [id number] PCP: Maree Leni Edyth DELENA, MD  [facility] Providers Cardiologist:  Vinie JAYSON Maxcy, MD { Click to update primary MD,subspecialty MD or APP then REFRESH:1}    {Click to Open Review  :1}   Patient Profile:       Chief Complaint: *** History of Present Illness:  Sherri Hart is a 78 y.o. female with visit-pertinent history of atrial fibrillation, COPD, chronic diastolic heart failure  Patient has been lost to cardiology follow-up since 2022 and presented on 03/10/2024 to reestablish care.  Patient previously had echocardiogram in 06/2020 that showed LVEF 55 to 60%, mild RV dysfunction, severe biatrial enlargement.  She was admitted in 01/2021 with dyspnea on exertion.  She was found to be in atrial fibrillation with RVR in the setting of COPD exacerbation.  She was discharged on diltiazem  180 mg daily.  She was later admitted in 08/2021 with COPD exacerbation.  Echocardiogram at admission showed LVEF 60 to 65%, no RWMA, mild LVH, moderately elevated PA systolic pressure, severe biatrial dilation, mild MR.  She had not been seen by cardiology since 2022.  She was admitted 6/14 with acute respiratory failure.  Presented to the ED complaining of increased shortness of breath and lower extremity swelling.  BNP elevated to 10.  Chest x-ray showed cardiomegaly with vascular congestion and mild interstitial opacity.  She was treated with IV Lasix , transition to p.o. Lasix  prior to discharge.  She remained on Jardiance  and spironolactone .  She was last seen in clinic on 03/10/2024.  She presents for hospital follow-up and reestablish care with cardiology.  She had been feeling well since she was discharged from the hospital.  She has home oxygen that she uses as needed.  Her shortness of breath is stable.  Denied any increased use of home oxygen.  She did notice some mild increase in lower  extremity swelling.  Her Lasix  was increased to 40 mg twice daily for the next 3 days and then to be resumed at 40 mg daily.  Discussed the use of AI scribe software for clinical note transcription with the patient, who gave verbal consent to proceed.  History of Present Illness     Review of systems:  Please see the history of present illness. All other systems are reviewed and otherwise negative. ***      Studies Reviewed:        ***  Risk Assessment/Calculations:   {Does this patient have ATRIAL FIBRILLATION?[phone]} No BP recorded.  {Refresh Note OR Click here to enter BP  :1}***        Physical Exam:   VS:  There were no vitals taken for this visit.   Wt Readings from Last 3 Encounters:  03/10/24 254 lb (115.2 kg)  03/02/24 257 lb 4.8 oz (116.7 kg)  11/20/23 268 lb 11.9 oz (121.9 kg)    GEN: Well nourished, well developed in no acute distress NECK: No JVD; No carotid bruits CARDIAC: ***RRR, no murmurs, rubs, gallops RESPIRATORY:  Clear to auscultation without rales, wheezing or rhonchi  ABDOMEN: Soft, non-tender, non-distended EXTREMITIES:  No edema; No acute deformity ***      Assessment and Plan:    Assessment and Plan Assessment & Plan      {Are you ordering a CV Procedure (e.g. stress test, cath, DCCV, TEE, etc)?   Press F2        :[id number]}  Dispo:  No follow-ups on file.  Signed, Lum LITTIE Louis, NP

## 2024-07-13 ENCOUNTER — Other Ambulatory Visit: Payer: Self-pay | Admitting: Family Medicine

## 2024-07-13 ENCOUNTER — Ambulatory Visit
Admission: RE | Admit: 2024-07-13 | Discharge: 2024-07-13 | Disposition: A | Source: Ambulatory Visit | Attending: Family Medicine | Admitting: Family Medicine

## 2024-07-13 DIAGNOSIS — M25511 Pain in right shoulder: Secondary | ICD-10-CM
